# Patient Record
Sex: Female | Born: 1960 | Race: Black or African American | Hispanic: No | Marital: Married | Smoking: Former smoker
Health system: Southern US, Community
[De-identification: ages and names within clinical notes are randomized; demographics above are authoritative.]

## PROBLEM LIST (undated history)

## (undated) ENCOUNTER — Ambulatory Visit: Admission: EM | Source: Home / Self Care

## (undated) DIAGNOSIS — I10 Essential (primary) hypertension: Secondary | ICD-10-CM

## (undated) DIAGNOSIS — F419 Anxiety disorder, unspecified: Secondary | ICD-10-CM

## (undated) DIAGNOSIS — Z87442 Personal history of urinary calculi: Secondary | ICD-10-CM

## (undated) DIAGNOSIS — G43909 Migraine, unspecified, not intractable, without status migrainosus: Secondary | ICD-10-CM

## (undated) DIAGNOSIS — I219 Acute myocardial infarction, unspecified: Secondary | ICD-10-CM

## (undated) DIAGNOSIS — M797 Fibromyalgia: Secondary | ICD-10-CM

## (undated) DIAGNOSIS — E669 Obesity, unspecified: Secondary | ICD-10-CM

## (undated) DIAGNOSIS — K219 Gastro-esophageal reflux disease without esophagitis: Secondary | ICD-10-CM

## (undated) DIAGNOSIS — R9431 Abnormal electrocardiogram [ECG] [EKG]: Secondary | ICD-10-CM

## (undated) DIAGNOSIS — E119 Type 2 diabetes mellitus without complications: Secondary | ICD-10-CM

## (undated) DIAGNOSIS — J189 Pneumonia, unspecified organism: Secondary | ICD-10-CM

## (undated) DIAGNOSIS — R0602 Shortness of breath: Secondary | ICD-10-CM

## (undated) DIAGNOSIS — M549 Dorsalgia, unspecified: Secondary | ICD-10-CM

## (undated) DIAGNOSIS — R51 Headache: Secondary | ICD-10-CM

## (undated) DIAGNOSIS — G8929 Other chronic pain: Secondary | ICD-10-CM

## (undated) DIAGNOSIS — F32A Depression, unspecified: Secondary | ICD-10-CM

## (undated) DIAGNOSIS — J40 Bronchitis, not specified as acute or chronic: Secondary | ICD-10-CM

## (undated) HISTORY — PX: TUBAL LIGATION: SHX77

## (undated) HISTORY — PX: OTHER SURGICAL HISTORY: SHX169

---

## 2002-02-10 ENCOUNTER — Other Ambulatory Visit: Admission: RE | Admit: 2002-02-10 | Discharge: 2002-02-10 | Payer: Self-pay | Admitting: *Deleted

## 2002-07-27 ENCOUNTER — Ambulatory Visit (HOSPITAL_COMMUNITY): Admission: RE | Admit: 2002-07-27 | Discharge: 2002-07-27 | Payer: Self-pay | Admitting: *Deleted

## 2003-04-07 ENCOUNTER — Encounter: Payer: Self-pay | Admitting: General Practice

## 2003-04-07 ENCOUNTER — Encounter: Admission: RE | Admit: 2003-04-07 | Discharge: 2003-04-07 | Payer: Self-pay | Admitting: General Practice

## 2003-04-09 ENCOUNTER — Encounter: Admission: RE | Admit: 2003-04-09 | Discharge: 2003-07-08 | Payer: Self-pay | Admitting: General Practice

## 2006-04-01 ENCOUNTER — Emergency Department (HOSPITAL_COMMUNITY): Admission: EM | Admit: 2006-04-01 | Discharge: 2006-04-01 | Payer: Self-pay | Admitting: Emergency Medicine

## 2007-08-01 ENCOUNTER — Emergency Department (HOSPITAL_COMMUNITY): Admission: EM | Admit: 2007-08-01 | Discharge: 2007-08-02 | Payer: Self-pay | Admitting: Emergency Medicine

## 2008-08-10 ENCOUNTER — Ambulatory Visit: Payer: Self-pay | Admitting: Family Medicine

## 2008-08-17 ENCOUNTER — Ambulatory Visit: Payer: Self-pay | Admitting: Family Medicine

## 2008-09-21 ENCOUNTER — Ambulatory Visit: Payer: Self-pay | Admitting: Family Medicine

## 2008-12-09 ENCOUNTER — Ambulatory Visit: Payer: Self-pay | Admitting: Family Medicine

## 2009-01-17 ENCOUNTER — Ambulatory Visit: Payer: Self-pay | Admitting: Family Medicine

## 2009-03-30 ENCOUNTER — Ambulatory Visit: Payer: Self-pay | Admitting: Family Medicine

## 2009-07-22 ENCOUNTER — Emergency Department (HOSPITAL_COMMUNITY): Admission: EM | Admit: 2009-07-22 | Discharge: 2009-07-22 | Payer: Self-pay | Admitting: Emergency Medicine

## 2009-07-23 ENCOUNTER — Emergency Department (HOSPITAL_BASED_OUTPATIENT_CLINIC_OR_DEPARTMENT_OTHER): Admission: EM | Admit: 2009-07-23 | Discharge: 2009-07-23 | Payer: Self-pay | Admitting: Emergency Medicine

## 2009-11-18 ENCOUNTER — Emergency Department (HOSPITAL_COMMUNITY): Admission: EM | Admit: 2009-11-18 | Discharge: 2009-11-18 | Payer: Self-pay | Admitting: Family Medicine

## 2010-06-27 ENCOUNTER — Ambulatory Visit (HOSPITAL_COMMUNITY): Admission: RE | Admit: 2010-06-27 | Discharge: 2010-06-27 | Payer: Self-pay | Admitting: Internal Medicine

## 2010-09-05 ENCOUNTER — Encounter
Admission: RE | Admit: 2010-09-05 | Discharge: 2010-09-14 | Payer: Self-pay | Source: Home / Self Care | Attending: Orthopaedic Surgery | Admitting: Orthopaedic Surgery

## 2010-09-20 ENCOUNTER — Encounter
Admission: RE | Admit: 2010-09-20 | Discharge: 2010-10-17 | Payer: Self-pay | Source: Home / Self Care | Attending: Orthopaedic Surgery | Admitting: Orthopaedic Surgery

## 2010-09-25 ENCOUNTER — Encounter: Admit: 2010-09-25 | Payer: Self-pay | Admitting: Orthopaedic Surgery

## 2010-10-08 ENCOUNTER — Encounter: Payer: Self-pay | Admitting: Internal Medicine

## 2010-10-19 ENCOUNTER — Ambulatory Visit: Payer: BC Managed Care – PPO

## 2010-10-20 ENCOUNTER — Ambulatory Visit: Payer: BC Managed Care – PPO | Attending: Orthopaedic Surgery | Admitting: Physical Therapy

## 2010-10-20 DIAGNOSIS — M545 Low back pain, unspecified: Secondary | ICD-10-CM | POA: Insufficient documentation

## 2010-10-20 DIAGNOSIS — IMO0001 Reserved for inherently not codable concepts without codable children: Secondary | ICD-10-CM | POA: Insufficient documentation

## 2010-10-20 DIAGNOSIS — M6281 Muscle weakness (generalized): Secondary | ICD-10-CM | POA: Insufficient documentation

## 2010-10-26 ENCOUNTER — Ambulatory Visit: Payer: BC Managed Care – PPO | Admitting: Physical Therapy

## 2010-10-27 ENCOUNTER — Ambulatory Visit: Payer: BC Managed Care – PPO | Admitting: Physical Therapy

## 2010-11-02 ENCOUNTER — Ambulatory Visit: Payer: BC Managed Care – PPO | Admitting: Physical Therapy

## 2010-11-03 ENCOUNTER — Ambulatory Visit: Payer: BC Managed Care – PPO | Admitting: Physical Therapy

## 2010-11-16 ENCOUNTER — Ambulatory Visit: Payer: BC Managed Care – PPO | Admitting: Physical Therapy

## 2010-11-17 ENCOUNTER — Ambulatory Visit: Payer: BC Managed Care – PPO | Attending: Orthopaedic Surgery | Admitting: Physical Therapy

## 2010-11-17 DIAGNOSIS — M545 Low back pain, unspecified: Secondary | ICD-10-CM | POA: Insufficient documentation

## 2010-11-17 DIAGNOSIS — M6281 Muscle weakness (generalized): Secondary | ICD-10-CM | POA: Insufficient documentation

## 2010-11-17 DIAGNOSIS — IMO0001 Reserved for inherently not codable concepts without codable children: Secondary | ICD-10-CM | POA: Insufficient documentation

## 2010-11-23 ENCOUNTER — Ambulatory Visit: Payer: BC Managed Care – PPO

## 2010-11-24 ENCOUNTER — Ambulatory Visit: Payer: BC Managed Care – PPO

## 2010-11-28 ENCOUNTER — Ambulatory Visit: Payer: BC Managed Care – PPO

## 2010-11-29 ENCOUNTER — Ambulatory Visit: Payer: BC Managed Care – PPO

## 2010-11-30 ENCOUNTER — Encounter: Payer: Self-pay | Admitting: Physical Therapy

## 2010-12-20 LAB — DIFFERENTIAL
Basophils Absolute: 0.1 10*3/uL (ref 0.0–0.1)
Eosinophils Relative: 1 % (ref 0–5)
Lymphocytes Relative: 18 % (ref 12–46)
Lymphs Abs: 1 10*3/uL (ref 0.7–4.0)
Monocytes Absolute: 0.4 10*3/uL (ref 0.1–1.0)
Monocytes Relative: 7 % (ref 3–12)

## 2010-12-20 LAB — CBC
HCT: 38.6 % (ref 36.0–46.0)
Hemoglobin: 12.9 g/dL (ref 12.0–15.0)
MCHC: 33.4 g/dL (ref 30.0–36.0)
MCV: 92.8 fL (ref 78.0–100.0)
Platelets: 225 10*3/uL (ref 150–400)
RBC: 4.16 MIL/uL (ref 3.87–5.11)
RDW: 12.7 % (ref 11.5–15.5)
WBC: 5.9 10*3/uL (ref 4.0–10.5)

## 2010-12-20 LAB — COMPREHENSIVE METABOLIC PANEL
ALT: 16 U/L (ref 0–35)
AST: 24 U/L (ref 0–37)
Albumin: 4.1 g/dL (ref 3.5–5.2)
Alkaline Phosphatase: 65 U/L (ref 39–117)
BUN: 11 mg/dL (ref 6–23)
CO2: 27 mEq/L (ref 19–32)
Calcium: 9 mg/dL (ref 8.4–10.5)
Chloride: 105 mEq/L (ref 96–112)
Creatinine, Ser: 0.6 mg/dL (ref 0.4–1.2)
GFR calc Af Amer: >60 mL/min (ref >60)
GFR calc non Af Amer: >60 mL/min (ref >60)
Glucose, Bld: 92 mg/dL (ref 70–99)
Potassium: 4 mEq/L (ref 3.5–5.1)
Sodium: 141 mEq/L (ref 135–145)
Total Bilirubin: 0.8 mg/dL (ref 0.3–1.2)
Total Protein: 7.8 g/dL (ref 6.0–8.3)

## 2010-12-20 LAB — URINALYSIS, ROUTINE W REFLEX MICROSCOPIC
Bilirubin Urine: NEGATIVE
Glucose, UA: NEGATIVE mg/dL
Specific Gravity, Urine: 1.005 (ref 1.005–1.030)
pH: 7.5 (ref 5.0–8.0)

## 2010-12-20 LAB — URINE MICROSCOPIC-ADD ON

## 2011-06-26 LAB — POCT CARDIAC MARKERS
Myoglobin, poc: 88.6
Operator id: 272551

## 2011-08-04 ENCOUNTER — Emergency Department (HOSPITAL_COMMUNITY)
Admission: EM | Admit: 2011-08-04 | Discharge: 2011-08-04 | Disposition: A | Discharge status: Home / Self Care | Payer: BC Managed Care – PPO | Attending: Emergency Medicine | Admitting: Emergency Medicine

## 2011-08-04 ENCOUNTER — Encounter: Payer: Self-pay | Admitting: *Deleted

## 2011-08-04 DIAGNOSIS — R5381 Other malaise: Secondary | ICD-10-CM | POA: Insufficient documentation

## 2011-08-04 DIAGNOSIS — R42 Dizziness and giddiness: Secondary | ICD-10-CM | POA: Insufficient documentation

## 2011-08-04 DIAGNOSIS — E860 Dehydration: Secondary | ICD-10-CM | POA: Insufficient documentation

## 2011-08-04 DIAGNOSIS — R109 Unspecified abdominal pain: Secondary | ICD-10-CM | POA: Insufficient documentation

## 2011-08-04 DIAGNOSIS — R5383 Other fatigue: Secondary | ICD-10-CM | POA: Insufficient documentation

## 2011-08-04 DIAGNOSIS — E876 Hypokalemia: Secondary | ICD-10-CM | POA: Insufficient documentation

## 2011-08-04 DIAGNOSIS — R197 Diarrhea, unspecified: Secondary | ICD-10-CM | POA: Insufficient documentation

## 2011-08-04 DIAGNOSIS — R6883 Chills (without fever): Secondary | ICD-10-CM | POA: Insufficient documentation

## 2011-08-04 DIAGNOSIS — R630 Anorexia: Secondary | ICD-10-CM | POA: Insufficient documentation

## 2011-08-04 LAB — URINALYSIS, ROUTINE W REFLEX MICROSCOPIC
Bilirubin Urine: NEGATIVE
Glucose, UA: NEGATIVE mg/dL
Ketones, ur: 40 mg/dL — AB
Leukocytes, UA: NEGATIVE
Nitrite: NEGATIVE
Protein, ur: NEGATIVE mg/dL
pH: 6.5 (ref 5.0–8.0)

## 2011-08-04 LAB — DIFFERENTIAL
Basophils Absolute: 0 10*3/uL (ref 0.0–0.1)
Eosinophils Absolute: 0 10*3/uL (ref 0.0–0.7)
Eosinophils Relative: 0 % (ref 0–5)
Monocytes Absolute: 0.5 10*3/uL (ref 0.1–1.0)

## 2011-08-04 LAB — CBC
HCT: 37.1 % (ref 36.0–46.0)
MCH: 31 pg (ref 26.0–34.0)
MCV: 87.9 fL (ref 78.0–100.0)
Platelets: 250 10*3/uL (ref 150–400)
RDW: 11.8 % (ref 11.5–15.5)

## 2011-08-04 LAB — URINE MICROSCOPIC-ADD ON

## 2011-08-04 LAB — POCT I-STAT, CHEM 8
BUN: 6 mg/dL (ref 6–23)
Calcium, Ion: 1.06 mmol/L — ABNORMAL LOW (ref 1.12–1.32)
Chloride: 106 mEq/L (ref 96–112)
Glucose, Bld: 86 mg/dL (ref 70–99)
TCO2: 21 mmol/L (ref 0–100)

## 2011-08-04 MED ORDER — SODIUM CHLORIDE 0.9 % IV BOLUS (SEPSIS)
1000.0000 mL | Freq: Once | INTRAVENOUS | Status: AC
  Administered 2011-08-04: 1000 mL via INTRAVENOUS

## 2011-08-04 MED ORDER — ONDANSETRON HCL 4 MG/2ML IJ SOLN
4.0000 mg | Freq: Once | INTRAMUSCULAR | Status: AC
  Administered 2011-08-04: 4 mg via INTRAVENOUS

## 2011-08-04 MED ORDER — SODIUM CHLORIDE 0.9 % IV SOLN: INTRAVENOUS | Status: DC

## 2011-08-04 MED ORDER — POTASSIUM CHLORIDE 20 MEQ/15ML (10%) PO LIQD
40.0000 meq | Freq: Once | ORAL | Status: AC
  Administered 2011-08-04: 40 meq via ORAL

## 2011-08-04 MED ORDER — MORPHINE SULFATE 4 MG/ML IJ SOLN
4.0000 mg | Freq: Once | INTRAMUSCULAR | Status: AC
  Administered 2011-08-04: 4 mg via INTRAVENOUS

## 2011-08-04 MED ORDER — ONDANSETRON HCL 4 MG PO TABS: 4.0000 mg | ORAL_TABLET | Freq: Three times a day (TID) | ORAL | Status: AC | PRN

## 2011-08-04 MED ORDER — POTASSIUM CHLORIDE 20 MEQ/15ML (10%) PO LIQD
ORAL | Status: AC
  Filled 2011-08-04: qty 30

## 2011-08-04 NOTE — ED Notes (Signed)
Patient is resting comfortably. 

## 2011-08-04 NOTE — ED Notes (Signed)
Pt attempting again to provide urine sample

## 2011-08-04 NOTE — ED Notes (Signed)
Patient here with c/o diarrhea and abdominal pain for the past few days.  Patient feels weak.

## 2011-08-04 NOTE — ED Provider Notes (Signed)
History    patient presents to the ED with a chief complaint of diarrhea. Patient states she has had 4 or 5 bouts of diarrhea every day for the past week. She denies nausea or vomiting but afraid to eat because of her diarrhea. She denies fever or chills. She does endorse feeling fatigue, lightheadedness, and increase shortness of breath.  Patient states she initially will having abdominal cramping and constipation for several weeks prior. She was seen and evaluated by Dr. Elnoria Howard, a gastroenterologist. She had a colonoscopy performed and subsequently started on a new medication, amitiza (lubiprostone).  Patient states after taking her meds as of for a few days she begins to develop diarrhea. She denies taking antibiotic. She denies blood per rectum. She denies recent travel, or eating exotic food. She has been feeling very dehydrated.  She also mentioned that she has started her menstruation. She does complain of abdominal cramping, and has discussed this with Dr. Elnoria Howard.  Dr. acknowledged that her medication may have worsening abdominal cramping she is currently having her menstruation. He recommends for her to come to the ED for fluid hydration. He also recommended for her to stop all medication.  CSN: [id number] Arrival date & time: 08/04/2011  4:33 PM   First MD Initiated Contact with Patient 08/04/11 1845      Chief Complaint  Patient presents with  . Diarrhea    (Consider location/radiation/quality/duration/timing/severity/associated sxs/prior treatment) Patient is a 50 y.o. female presenting with diarrhea. The history is provided by the patient. No language interpreter was used.  Diarrhea The primary symptoms include fatigue, abdominal pain and diarrhea. Primary symptoms do not include fever, nausea, vomiting, melena, dysuria or rash. The illness began 2 days ago. The onset was gradual.  The fatigue began 6 to 7 days ago.  The abdominal pain has been unchanged since its onset. The abdominal  pain is generalized.  The illness is also significant for chills and anorexia. The illness does not include constipation. Significant associated medical issues include irritable bowel syndrome.    History reviewed. No pertinent past medical history.  History reviewed. No pertinent past surgical history.  History reviewed. No pertinent family history.  History  Substance Use Topics  . Smoking status: Never Smoker   . Smokeless tobacco: Not on file  . Alcohol Use: No    OB History    Grav Para Term Preterm Abortions TAB SAB Ect Mult Living                  Review of Systems  Constitutional: Positive for chills and fatigue. Negative for fever.  Gastrointestinal: Positive for abdominal pain, diarrhea and anorexia. Negative for nausea, vomiting, constipation and melena.  Genitourinary: Negative for dysuria.  Skin: Negative for rash.  All other systems reviewed and are negative.    Allergies  Review of patient's allergies indicates no known allergies.  Home Medications   Current Outpatient Rx  Name Route Sig Dispense Refill  . HYOSCYAMINE SULFATE 0.125 MG PO TABS Oral Take 0.125 mg by mouth every 6 (six) hours as needed. spasm     . PREGABALIN 75 MG PO CAPS Oral Take 75 mg by mouth 2 (two) times daily.      . SERTRALINE HCL 50 MG PO TABS Oral Take 50 mg by mouth 2 (two) times daily.        BP 137/84  Pulse 89  Temp(Src) 98.2 F (36.8 C) (Oral)  Resp 16  SpO2 99%  Physical Exam  Constitutional: She  is oriented to person, place, and time. She appears well-developed and well-nourished. No distress.  HENT:  Head: Normocephalic and atraumatic.  Eyes: Conjunctivae are normal.  Neck: Normal range of motion. Neck supple.  Cardiovascular: Normal rate and regular rhythm.  Exam reveals no gallop and no friction rub.   No murmur heard. Pulmonary/Chest: Effort normal. No respiratory distress. She has no wheezes.  Abdominal: Soft. Bowel sounds are normal. There is no  tenderness. There is no rebound and no guarding.  Musculoskeletal: Normal range of motion.  Neurological: She is alert and oriented to person, place, and time.  Skin: She is diaphoretic. There is pallor.    ED Course  Procedures (including critical care time)  Labs Reviewed - No data to display No results found.   No diagnosis found.    MDM   patient appears dehydrated, and weak.  Oral mucosa is dry. Her abdomen is benign. With recent menstruation and continuous bout of diarrhea, the patient is dehydrated. IV fluid initiated. I will continue to monitor patient care.   11:17 PM Patient states she feels much better after IV fluid hydration. She has not had any bouts of diarrhea while in the ED. She felt comfortable going home. She has agreed to followup with her primary care doctor on Monday.      Fayrene Helper, PA 08/04/11 2322

## 2011-08-04 NOTE — ED Notes (Signed)
Family at bedside. 

## 2011-08-04 NOTE — ED Notes (Signed)
Vital signs stable. 

## 2011-08-05 NOTE — ED Provider Notes (Signed)
Medical screening examination/treatment/procedure(s) were performed by non-physician practitioner and as supervising physician I was immediately available for consultation/collaboration.  Flint Melter, MD 08/05/11 [phone]

## 2011-08-13 ENCOUNTER — Other Ambulatory Visit: Payer: Self-pay | Admitting: Obstetrics & Gynecology

## 2011-08-13 DIAGNOSIS — Z1231 Encounter for screening mammogram for malignant neoplasm of breast: Secondary | ICD-10-CM

## 2011-08-24 ENCOUNTER — Encounter (HOSPITAL_COMMUNITY): Payer: Self-pay | Admitting: Pharmacy Technician

## 2011-08-28 ENCOUNTER — Ambulatory Visit (HOSPITAL_COMMUNITY)
Admission: RE | Admit: 2011-08-28 | Discharge: 2011-08-28 | Disposition: A | Discharge status: Home / Self Care | Payer: BC Managed Care – PPO | Source: Ambulatory Visit | Attending: Obstetrics & Gynecology | Admitting: Obstetrics & Gynecology

## 2011-08-28 ENCOUNTER — Encounter (HOSPITAL_COMMUNITY): Payer: Self-pay

## 2011-08-28 ENCOUNTER — Encounter (HOSPITAL_COMMUNITY)
Admission: RE | Admit: 2011-08-28 | Discharge: 2011-08-28 | Disposition: A | Discharge status: Home / Self Care | Payer: BC Managed Care – PPO | Source: Ambulatory Visit | Attending: Obstetrics & Gynecology | Admitting: Obstetrics & Gynecology

## 2011-08-28 DIAGNOSIS — M4120 Other idiopathic scoliosis, site unspecified: Secondary | ICD-10-CM | POA: Insufficient documentation

## 2011-08-28 DIAGNOSIS — Z01818 Encounter for other preprocedural examination: Secondary | ICD-10-CM | POA: Insufficient documentation

## 2011-08-28 DIAGNOSIS — Z01812 Encounter for preprocedural laboratory examination: Secondary | ICD-10-CM | POA: Insufficient documentation

## 2011-08-28 HISTORY — DX: Anxiety disorder, unspecified: F41.9

## 2011-08-28 HISTORY — DX: Gastro-esophageal reflux disease without esophagitis: K21.9

## 2011-08-28 HISTORY — DX: Fibromyalgia: M79.7

## 2011-08-28 HISTORY — DX: Essential (primary) hypertension: I10

## 2011-08-28 HISTORY — DX: Headache: R51

## 2011-08-28 HISTORY — DX: Shortness of breath: R06.02

## 2011-08-28 LAB — BASIC METABOLIC PANEL
BUN: 11 mg/dL (ref 6–23)
CO2: 27 mEq/L (ref 19–32)
Chloride: 103 mEq/L (ref 96–112)
Creatinine, Ser: 0.67 mg/dL (ref 0.50–1.10)
Potassium: 3.6 mEq/L (ref 3.5–5.1)

## 2011-08-28 LAB — SURGICAL PCR SCREEN
MRSA, PCR: INVALID — AB
Staphylococcus aureus: INVALID — AB

## 2011-08-28 LAB — CBC
HCT: 39.2 % (ref 36.0–46.0)
Hemoglobin: 13.3 g/dL (ref 12.0–15.0)
RBC: 4.38 MIL/uL (ref 3.87–5.11)

## 2011-08-28 NOTE — Pre-Procedure Instructions (Addendum)
PT'S OFFICE NOTES FROM MONICA LYALL, NP AND EKG REPORT OBTAINED--OFFICE VISIT WAS 08/21/11--PT STATES SHE WAS HAVING CHEST PRESSURE--SOME SOB AND FEELING TIRED.  PT STATES SHE WAS TOLD HER B/P ELEVATED AND EKG ABNORMAL--GIVEN B/P MEDICATION TO START TAKING AND TOLD THAT THE OFFICE WOULD ARRANGE FOR HER TO SEE A CARDIOLOGIST.  PT STATES DR. JACKSON-MOORE NOT AWARE.  COPY OF THE OFFICE NOTES, EKG  FAXED TO DR. JACKSON-MOORE'S OFFICE AND JASMINE-SURGERY SCHEDULER FOR DR. JACKSON-MOORE NOTIFIED-- SHE WILL BE SURE REPORTS GO TO DR. JACKSON-MOORE. 09/07/11 -SPOKE WITH PT BY PHONE--SHE SAW CARDIOLOGIST--HAD STRESS TEST-PT TOLD TEST WAS OK.  STATES SHE IS WAITING FOR HER MEDICAL DOCTOR TO GIVE CLEARANCE FOR HER SURGERY.  I OBTAINED PT'S CARDIOLOGY OFFICE NOTES 08/29/11 WITH EKG FROM DR. HARDING WITH [facility] AND OBTAINED NUCLEAR STRESS TEST REPORT 08/30/11-NORMAL. REPORTS ARE ON THIS CHART. 09/13/11  DR. JACKSON-MOORE'S OFFICE FAXED NOTE OF CARDIAC CLEARANCE FROM DR. CROITORU-CARDIOLOGIST AND NOTE PLACED ON THIS CHART.

## 2011-08-28 NOTE — Patient Instructions (Addendum)
19 Lisa Casey  08/28/2011   Your procedure is scheduled on:  Friday 08/31/11 AT 7:30 AM  Report to [facility] at  5:30 AM.  Call this number if you have problems the morning of surgery: [phone]   Remember:   Do not eat food OR DRINK ANYTHING AFTER MIDNIGHT THE NIGHT BEFORE YOUR SURGERY.    Take these medicines the morning of surgery with A SIP OF WATER: SERTRALINE AND LYRICA   Do not wear jewelry, make-up or nail polish.  Do not wear lotions, powders, or perfumes. You may wear deodorant.  Do not shave 48 hours prior to surgery.  Do not bring valuables to the hospital.  Contacts, dentures or bridgework may not be worn into surgery.  Leave suitcase in the car. After surgery it may be brought to your room.  For patients admitted to the hospital, checkout time is 11:00 AM the day of discharge.   Patients discharged the day of surgery will not be allowed to drive home.    Special Instructions: CHG Shower Use Special Wash: 1/2 bottle night before surgery and 1/2 bottle morning of surgery.   Please read over the following fact sheets that you were given: Blood Transfusion Information and MRSA Information  ADDEND:  09/07/11 3PM  PT NOTIFIED HER SURGERY DATE WAS CHANGED TO 09/14/11 AT 11:15 AM--SHE WOULD NEED TO ARRIVE TO SHORT STAY BY 9:00 AM --ALL OTHER INSTRUCTIONS THE SAME-INCLUDING NPO AFTER MIDNIGHT EXCEPT SIP OF WATER TO TAKE MEDS AS DISCUSSED PREVIOUSLY.  INSTRUCTIONS WERE GIVEN TO PT BY TELEPHONE CONVERSATION.

## 2011-08-31 LAB — MRSA CULTURE

## 2011-09-13 ENCOUNTER — Encounter (HOSPITAL_COMMUNITY): Payer: Self-pay | Admitting: Obstetrics & Gynecology

## 2011-09-13 ENCOUNTER — Ambulatory Visit (HOSPITAL_COMMUNITY): Payer: BC Managed Care – PPO

## 2011-09-13 NOTE — H&P (Signed)
  Subjective:  Lisa Casey is a 50 y.o. gravida 3 para 2, female.  She presented with irregular bleeding.   Bleeding is characterized as moderate. Evaluation to date: pelvic ultrasound: positive for uterine fibroids. Treatment to date: Nuva Ring.  Pertinent Gyn History:  Menses irregular Bleeding: dysfunctional uterine bleeding Contraception: tubal ligation DES exposure: unknown  Last pap: normal Date: 9/12  There are no active problems to display for this patient.  Past Medical History  Diagnosis Date  . Fibromyalgia     PT ON LYRIC   . Hypertension     HX OF ELEVATED B/P FOR SEVERAL YRS--NO MEDS UNTIL LAST WEEK--PT WAS C/O OF CHEST PRESSURE AND B/P ELEVATED--PT SAW NURSE PRACTIONER ON 08/21/11 AND STATES EKG WAS DONE--TOLD ABNORMAL--PUT ON B/P MEDICATION LISINOPRIL AND TOLD SHE WAS TO SEE A CARDIOLOGIST.  [name] Shortness of breath     WITH EXERTION--NEW FOR PT--PT ALSO REPORTS FEELING TIRED  . GERD (gastroesophageal reflux disease)     NOT ON ANY MEDS AT PRESENT  . Headache     HX OF MIGRAINES  . Anxiety     HX OF ANXIETY ATTACKS    Past Surgical History  Procedure Date  . Tubal ligation   . Constipation     No prescriptions prior to admission   No Known Allergies  History  Substance Use Topics  . Smoking status: Never Smoker   . Smokeless tobacco: Never Used  . Alcohol Use: No    History reviewed. No pertinent family history.   Review of Systems Pertinent items are noted in HPI.    Objective:   Vital signs in last 24 hours:    General:   alert  Skin:   normal  HEENT:  PERRLA  Lungs:   clear to auscultation bilaterally  Heart:   regular rate and rhythm, S1, S2 normal, no murmur, click, rub or gallop  Breasts:   normal without suspicious masses, skin or nipple changes or axillary nodes  Abdomen:  soft, non-tender; bowel sounds normal; no masses,  no organomegaly  Pelvis:  Vulva and vagina appear normal. Bimanual exam reveals normal uterus and  adnexa. Vaginal: normal mucosa without prolapse or lesions Cervix: normal appearance Adnexa: non palpable Uterus: anteverted and slightly enlarged                                     Assessment/Plan:  Perimenopausal DUB, small fibroid uterus     I had a lengthy discussion with the patient regarding her bleeding and consideration for endometrial ablation versus hysterectomy.  Procedure, risks, reasons, benefits and complications (including injury to bowel, bladder, major blood vessel, ureter, bleeding, possibility of transfusion, infection, or fistula formation) were reviewed in detail. Consent was signed and preop testing was ordered.  Instructions were reviewed, including NPO after midnight.

## 2011-09-14 ENCOUNTER — Encounter (HOSPITAL_COMMUNITY): Payer: Self-pay | Admitting: Anesthesiology

## 2011-09-14 ENCOUNTER — Encounter (HOSPITAL_COMMUNITY)
Admission: RE | Disposition: A | Discharge status: Home / Self Care | Payer: Self-pay | Source: Ambulatory Visit | Attending: Obstetrics & Gynecology

## 2011-09-14 ENCOUNTER — Other Ambulatory Visit: Payer: Self-pay | Admitting: Obstetrics & Gynecology

## 2011-09-14 ENCOUNTER — Encounter (HOSPITAL_COMMUNITY): Payer: Self-pay

## 2011-09-14 ENCOUNTER — Other Ambulatory Visit: Payer: Self-pay

## 2011-09-14 ENCOUNTER — Ambulatory Visit (HOSPITAL_COMMUNITY)
Admission: RE | Admit: 2011-09-14 | Discharge: 2011-09-15 | Disposition: A | Discharge status: Home / Self Care | Payer: BC Managed Care – PPO | Source: Ambulatory Visit | Attending: Obstetrics & Gynecology | Admitting: Obstetrics & Gynecology

## 2011-09-14 ENCOUNTER — Ambulatory Visit (HOSPITAL_COMMUNITY): Payer: BC Managed Care – PPO | Admitting: Anesthesiology

## 2011-09-14 DIAGNOSIS — I10 Essential (primary) hypertension: Secondary | ICD-10-CM | POA: Insufficient documentation

## 2011-09-14 DIAGNOSIS — K219 Gastro-esophageal reflux disease without esophagitis: Secondary | ICD-10-CM | POA: Insufficient documentation

## 2011-09-14 DIAGNOSIS — D259 Leiomyoma of uterus, unspecified: Secondary | ICD-10-CM | POA: Diagnosis present

## 2011-09-14 DIAGNOSIS — IMO0001 Reserved for inherently not codable concepts without codable children: Secondary | ICD-10-CM | POA: Insufficient documentation

## 2011-09-14 DIAGNOSIS — N938 Other specified abnormal uterine and vaginal bleeding: Secondary | ICD-10-CM | POA: Insufficient documentation

## 2011-09-14 DIAGNOSIS — Z79899 Other long term (current) drug therapy: Secondary | ICD-10-CM | POA: Insufficient documentation

## 2011-09-14 DIAGNOSIS — N949 Unspecified condition associated with female genital organs and menstrual cycle: Secondary | ICD-10-CM | POA: Insufficient documentation

## 2011-09-14 HISTORY — PX: ROBOTIC ASSISTED LAP VAGINAL HYSTERECTOMY: SHX2362

## 2011-09-14 LAB — TYPE AND SCREEN: ABO/RH(D): B POS

## 2011-09-14 LAB — ABO/RH: ABO/RH(D): B POS

## 2011-09-14 SURGERY — ROBOTIC ASSISTED LAPAROSCOPIC VAGINAL HYSTERECTOMY
Anesthesia: General | Site: Abdomen | Wound class: Clean Contaminated

## 2011-09-14 MED ORDER — LISINOPRIL 10 MG PO TABS
10.0000 mg | ORAL_TABLET | ORAL | Status: DC
  Administered 2011-09-15: 10 mg via ORAL
  Filled 2011-09-14: qty 1

## 2011-09-14 MED ORDER — LACTATED RINGERS IV SOLN
INTRAVENOUS | Status: DC
  Administered 2011-09-14: 1000 mL via INTRAVENOUS
  Administered 2011-09-14 (×2): via INTRAVENOUS

## 2011-09-14 MED ORDER — GLYCOPYRROLATE 0.2 MG/ML IJ SOLN
INTRAMUSCULAR | Status: DC | PRN
  Administered 2011-09-14: .6 mg via INTRAVENOUS

## 2011-09-14 MED ORDER — FENTANYL CITRATE 0.05 MG/ML IJ SOLN
25.0000 ug | INTRAMUSCULAR | Status: DC | PRN
  Administered 2011-09-14: 50 ug via INTRAVENOUS

## 2011-09-14 MED ORDER — LIDOCAINE HCL (CARDIAC) 20 MG/ML IV SOLN
INTRAVENOUS | Status: DC | PRN
  Administered 2011-09-14: 100 mg via INTRAVENOUS

## 2011-09-14 MED ORDER — PREGABALIN 75 MG PO CAPS
75.0000 mg | ORAL_CAPSULE | Freq: Two times a day (BID) | ORAL | Status: DC
  Administered 2011-09-14 – 2011-09-15 (×2): 75 mg via ORAL
  Filled 2011-09-14 (×2): qty 1

## 2011-09-14 MED ORDER — KCL IN DEXTROSE-NACL 20-5-0.45 MEQ/L-%-% IV SOLN
INTRAVENOUS | Status: DC
  Administered 2011-09-14 – 2011-09-15 (×3): via INTRAVENOUS
  Filled 2011-09-14 (×6): qty 1000

## 2011-09-14 MED ORDER — BUPIVACAINE HCL 0.25 % IJ SOLN
INTRAMUSCULAR | Status: DC | PRN
  Administered 2011-09-14: 10 mL

## 2011-09-14 MED ORDER — PROPOFOL 10 MG/ML IV EMUL
INTRAVENOUS | Status: DC | PRN
  Administered 2011-09-14: 160 mg via INTRAVENOUS

## 2011-09-14 MED ORDER — PROMETHAZINE HCL 25 MG/ML IJ SOLN: 6.2500 mg | INTRAMUSCULAR | Status: DC | PRN

## 2011-09-14 MED ORDER — NEOSTIGMINE METHYLSULFATE 1 MG/ML IJ SOLN
INTRAMUSCULAR | Status: DC | PRN
  Administered 2011-09-14: 4 mg via INTRAVENOUS

## 2011-09-14 MED ORDER — MIDAZOLAM HCL 5 MG/5ML IJ SOLN
INTRAMUSCULAR | Status: DC | PRN
  Administered 2011-09-14 (×2): 1 mg via INTRAVENOUS

## 2011-09-14 MED ORDER — SERTRALINE HCL 50 MG PO TABS
50.0000 mg | ORAL_TABLET | Freq: Two times a day (BID) | ORAL | Status: DC
  Administered 2011-09-14 – 2011-09-15 (×2): 50 mg via ORAL
  Filled 2011-09-14 (×3): qty 1

## 2011-09-14 MED ORDER — ZOLPIDEM TARTRATE 5 MG PO TABS
5.0000 mg | ORAL_TABLET | Freq: Every evening | ORAL | Status: DC | PRN
Start: 2011-09-14 — End: 2011-09-15

## 2011-09-14 MED ORDER — OXYCODONE-ACETAMINOPHEN 5-325 MG PO TABS
1.0000 | ORAL_TABLET | ORAL | Status: DC | PRN
  Administered 2011-09-14 – 2011-09-15 (×2): 2 via ORAL
  Filled 2011-09-14 (×2): qty 2

## 2011-09-14 MED ORDER — ONDANSETRON HCL 4 MG/2ML IJ SOLN
INTRAMUSCULAR | Status: DC | PRN
  Administered 2011-09-14: 4 mg via INTRAVENOUS

## 2011-09-14 MED ORDER — CISATRACURIUM BESYLATE 2 MG/ML IV SOLN
INTRAVENOUS | Status: DC | PRN
  Administered 2011-09-14: 16 mg via INTRAVENOUS
  Administered 2011-09-14: 4 mg via INTRAVENOUS

## 2011-09-14 MED ORDER — ACETAMINOPHEN 10 MG/ML IV SOLN
INTRAVENOUS | Status: DC | PRN
  Administered 2011-09-14: 1000 mg via INTRAVENOUS

## 2011-09-14 MED ORDER — LACTATED RINGERS IV SOLN: INTRAVENOUS | Status: DC

## 2011-09-14 MED ORDER — ONDANSETRON HCL 4 MG/2ML IJ SOLN: 4.0000 mg | Freq: Four times a day (QID) | INTRAMUSCULAR | Status: DC | PRN

## 2011-09-14 MED ORDER — LACTATED RINGERS IR SOLN
Status: DC | PRN
  Administered 2011-09-14: 1000 mL

## 2011-09-14 MED ORDER — MORPHINE SULFATE 2 MG/ML IJ SOLN
2.0000 mg | INTRAMUSCULAR | Status: DC | PRN
  Administered 2011-09-14: 2 mg via INTRAVENOUS
  Filled 2011-09-14 (×2): qty 1

## 2011-09-14 MED ORDER — ONDANSETRON HCL 4 MG PO TABS: 4.0000 mg | ORAL_TABLET | Freq: Four times a day (QID) | ORAL | Status: DC | PRN

## 2011-09-14 MED ORDER — SUFENTANIL CITRATE 50 MCG/ML IV SOLN
INTRAVENOUS | Status: DC | PRN
  Administered 2011-09-14: 20 ug via INTRAVENOUS
  Administered 2011-09-14 (×3): 10 ug via INTRAVENOUS

## 2011-09-14 MED ORDER — CEFAZOLIN SODIUM-DEXTROSE 2-3 GM-% IV SOLR
2.0000 g | Freq: Once | INTRAVENOUS | Status: AC
  Administered 2011-09-14: 2 g via INTRAVENOUS

## 2011-09-14 MED ORDER — DEXAMETHASONE SODIUM PHOSPHATE 4 MG/ML IJ SOLN
INTRAMUSCULAR | Status: DC | PRN
  Administered 2011-09-14: 8 mg via INTRAVENOUS

## 2011-09-14 SURGICAL SUPPLY — 38 items
ADH SKN CLS APL DERMABOND .7 (GAUZE/BANDAGES/DRESSINGS)
CHLORAPREP W/TINT 26ML (MISCELLANEOUS) ×2 IMPLANT
CLOTH BEACON ORANGE TIMEOUT ST (SAFETY) ×2 IMPLANT
CORDS BIPOLAR (ELECTRODE) ×2 IMPLANT
COVER SURGICAL LIGHT HANDLE (MISCELLANEOUS) ×2 IMPLANT
COVER TIP SHEARS 8 DVNC (MISCELLANEOUS) ×1 IMPLANT
COVER TIP SHEARS 8MM DA VINCI (MISCELLANEOUS) ×1
DECANTER SPIKE VIAL GLASS SM (MISCELLANEOUS) ×1 IMPLANT
DERMABOND ADVANCED (GAUZE/BANDAGES/DRESSINGS)
DERMABOND ADVANCED .7 DNX12 (GAUZE/BANDAGES/DRESSINGS) ×1 IMPLANT
DRAPE SURG IRRIG POUCH 19X23 (DRAPES) ×2 IMPLANT
DRSG TEGADERM 6X8 (GAUZE/BANDAGES/DRESSINGS) ×6 IMPLANT
ELECT REM PT RETURN 9FT ADLT (ELECTROSURGICAL) ×2
ELECTRODE REM PT RTRN 9FT ADLT (ELECTROSURGICAL) ×1 IMPLANT
FILTER SMOKE EVAC LAPAROSHD (FILTER) IMPLANT
GAUZE VASELINE 3X9 (GAUZE/BANDAGES/DRESSINGS) IMPLANT
GLOVE BIO SURGEON STRL SZ8 (GLOVE) ×2 IMPLANT
HOLDER FOLEY CATH W/STRAP (MISCELLANEOUS) ×1 IMPLANT
KIT ACCESSORY DA VINCI DISP (KITS) ×1
KIT ACCESSORY DVNC DISP (KITS) ×1 IMPLANT
MANIPULATOR UTERINE 4.5 ZUMI (MISCELLANEOUS) ×1 IMPLANT
OCCLUDER COLPOPNEUMO (BALLOONS) ×3 IMPLANT
PACK ROBOTIC CUSTOM GYN (CUSTOM PROCEDURE TRAY) ×2 IMPLANT
SET TUBE IRRIG SUCTION NO TIP (IRRIGATION / IRRIGATOR) ×2 IMPLANT
SOLUTION ELECTROLUBE (MISCELLANEOUS) ×2 IMPLANT
SPONGE LAP 18X18 X RAY DECT (DISPOSABLE) IMPLANT
SUT MNCRL AB 4-0 PS2 18 (SUTURE) ×2 IMPLANT
SUT VIC AB 0 CT1 27 (SUTURE) ×2
SUT VIC AB 0 CT1 27XBRD ANTBC (SUTURE) ×3 IMPLANT
SUT VIC AB 4-0 PS2 27 (SUTURE) ×2 IMPLANT
SUT VICRYL 0 UR6 27IN ABS (SUTURE) ×2 IMPLANT
SYR BULB IRRIGATION 50ML (SYRINGE) ×2 IMPLANT
SYR CONTROL 10ML LL (SYRINGE) ×4 IMPLANT
TROCAR 12M 150ML BLUNT (TROCAR) ×2 IMPLANT
TROCAR BLADELESS OPT 5 100 (ENDOMECHANICALS) ×2 IMPLANT
TROCAR BLADELESS OPT 5 75 (ENDOMECHANICALS) ×1 IMPLANT
TROCAR XCEL 12X100 BLDLESS (ENDOMECHANICALS) ×2 IMPLANT
WATER STERILE IRR 1500ML POUR (IV SOLUTION) ×4 IMPLANT

## 2011-09-14 NOTE — Anesthesia Procedure Notes (Signed)
Procedure Name: Intubation Date/Time: 09/14/2011 10:49 AM Performed by: Lurlean Leyden, Sharmaine Bain L. Patient Re-evaluated:Patient Re-evaluated prior to inductionOxygen Delivery Method: Circle System Utilized Preoxygenation: Pre-oxygenation with 100% oxygen Intubation Type: IV induction Ventilation: Mask ventilation without difficulty and Oral airway inserted - appropriate to patient size Laryngoscope Size: Miller and 2 Grade View: Grade I Tube type: Oral Tube size: 7.5 mm Number of attempts: 1 Airway Equipment and Method: stylet Placement Confirmation: ETT inserted through vocal cords under direct vision,  breath sounds checked- equal and bilateral and positive ETCO2 Secured at: 22 cm Tube secured with: Tape Dental Injury: Teeth and Oropharynx as per pre-operative assessment

## 2011-09-14 NOTE — Interval H&P Note (Signed)
History and Physical Interval Note:  09/14/2011 10:28 AM  Lisa Casey  has presented today for surgery, with the diagnosis of UTERINE FIBROIDS  The various methods of treatment have been discussed with the patient and family. After consideration of risks, benefits and other options for treatment, the patient has consented to  Procedure(s): ROBOTIC ASSISTED LAPAROSCOPIC VAGINAL HYSTERECTOMY as a surgical intervention .  The patients' history has been reviewed, patient examined, no change in status, stable for surgery.  I have reviewed the patients' chart and labs.  Questions were answered to the patient's satisfaction.     JACKSON-MOORE,Claryssa Sandner A

## 2011-09-14 NOTE — Anesthesia Postprocedure Evaluation (Signed)
Anesthesia Post Note  Patient: Lisa Casey  Procedure(s) Performed:  ROBOTIC ASSISTED LAPAROSCOPIC VAGINAL HYSTERECTOMY  Anesthesia type: General  Patient location: PACU  Post pain: Pain level controlled  Post assessment: Post-op Vital signs reviewed  Last Vitals:  Filed Vitals:   09/14/11 1400  BP: 123/73  Pulse: 52  Temp:   Resp: 11    Post vital signs: Reviewed  Level of consciousness: sedated  Complications: No apparent anesthesia complications

## 2011-09-14 NOTE — Anesthesia Preprocedure Evaluation (Signed)
Anesthesia Evaluation  Patient identified by MRN, date of birth, ID band Patient awake    Reviewed: Allergy & Precautions, H&P , NPO status , Patient's Chart, lab work & pertinent test results  Airway Mallampati: II TM Distance: >3 FB     Dental  (+) Teeth Intact and Dental Advisory Given,    Pulmonary neg pulmonary ROS, shortness of breath and with exertion,  clear to auscultation  Pulmonary exam normal       Cardiovascular hypertension, Pt. on medications neg cardio ROS Regular Normal    Neuro/Psych  Headaches, PSYCHIATRIC DISORDERS Anxiety  Neuromuscular disease Negative Neurological ROS  Negative Psych ROS   GI/Hepatic negative GI ROS, Neg liver ROS, GERD-  Medicated,  Endo/Other  Negative Endocrine ROS  Renal/GU negative Renal ROS  Genitourinary negative   Musculoskeletal negative musculoskeletal ROS (+) Fibromyalgia -  Abdominal Normal abdominal exam  (+)   Peds negative pediatric ROS (+)  Hematology negative hematology ROS (+)   Anesthesia Other Findings   Reproductive/Obstetrics negative OB ROS                           Anesthesia Physical Anesthesia Plan  ASA: II  Anesthesia Plan: General   Post-op Pain Management:    Induction: Intravenous  Airway Management Planned: Oral ETT  Additional Equipment:   Intra-op Plan:   Post-operative Plan: Extubation in OR  Informed Consent: I have reviewed the patients History and Physical, chart, labs and discussed the procedure including the risks, benefits and alternatives for the proposed anesthesia with the patient or authorized representative who has indicated his/her understanding and acceptance.   Dental advisory given  Plan Discussed with: CRNA  Anesthesia Plan Comments:         Anesthesia Quick Evaluation

## 2011-09-14 NOTE — Op Note (Signed)
Pre-operative Diagnosis: abnormal uterine bleeding and fibroids  Post-operative Diagnosis: abnormal uterine bleeding and fibroids  Operation: Robotic-assisted hysterectomy   Surgeon: Roseanna Rainbow  Assistant: Coral Ceo, MD  Anesthesia: GET  Urine Output: per Anesthesiology  Findings: Small, fibroid uterus  Estimated Blood Loss:  less than 100 mL                 Total IV Fluids: per Anesthesiology         Specimens: PATHOLOGY               Complications:  None; patient tolerated the procedure well.         Disposition: PACU - hemodynamically stable.         Condition: Stable    Procedure Details  The patient was seen in the Holding Room. The risks, benefits, complications, treatment options, and expected outcomes were discussed with the patient.  The patient concurred with the proposed plan, giving informed consent.  The site of surgery properly noted/marked. The patient was identified as Lisa Casey and the procedure verified as a Robotic-assisted hysterectomy with bilateral salpingectomy. A Time Out was held and the above information confirmed.  After induction of anesthesia, the patient was draped and prepped in the usual sterile manner. Pt was placed in supine position after anesthesia and draped and prepped in the usual sterile manner. The abdominal drape was placed after the CholoraPrep had been allowed to dry for 3 minutes.  Her arms were tucked to her side with all appropriate precautions.  The shoulder blocks were placed in the usual fashion.  The patient was placed in the semi-lithotomy position in [place] stirrups.  The perineum was prepped with Betadine.  Foley catheter was placed.  A sterile speculum was placed in the vagina.  The cervix was grasped with a single-tooth tenaculum and dilated with Shawnie Pons dilators.  The ZUMI uterine manipulator with a medium colpotomizer ring was placed without difficulty.  A pneum occluder balloon was placed over the  manipulator.  A second time-out was performed.  OG tube placement was confirmed and to suction.  Approximately 2 cm below the costal margin, in the midclavicular line the skin was anesthestized with 0.25% Marcaine.  A 5 mm incision was made and using a 5 mm Optiview, a 5 mm trocar was placed under direct vision.  The patient's abdomen was insufflated with CO2 gas.  At this point and all points during the procedure, the patient's intra-abdominal pressure did not exceed 15 mmHg.  A 10-12 camera port was place 24 cm above the pubic symphysis.  Bilateral 8 mm ports were place 10 cm and 15 degrees inferior.  All ports were placed under direct visualization.  The 5 mm port was removed.  The incision was extended to accommodate a 10 mm trocar.  A 10 mm trocar and sleeve were advanced under direct visualization.   The robot was docked in the usual fashion.  The round ligament on the right was transected with monopolar cautery.  The anterior and posterior leaves of the broad ligament were opened.  The ureter was identified.  A window was made between the infundibulopelvic ligament and the ureter.  The uterine-ovarian ligament/fallopian tube was coagulated with bipolar cautery and transected.  The uterine vessels were skeletonized to the level of the Koh ring.  A C-loop was created in the usual fashion.  A similar procedure was performed on the patient's left side. The round ligament on the left was transected with monopolar  cautery.  The anterior and posterior leaves of the broad ligament were opened.  The ureter was identified.  A window was made between the infundibulopelvic ligament and the ureter.  The uterine-ovarian ligament/fallopian tube was coagulated with bipolar cautery and transected.  The uterine vessels were skeletonized to the level of the Koh ring.  A C-loop was created in the usual fashion.   The bladder flap was completed.  At this time, the pneum occluder balloon was insufflated and a colpotomy was  performed.  The uterus, cervix, bilateral tubes and ovaries were delivered through the vagina.  All pedicles were inspected under low intraabdominal pressures and noted to be hemostatic.  The vagina cuff was closed using 0-Vicryl on a CT-1 needle in a running fashion.  The abdomen and pelvis were copiously irrigated.  The needle was removed without difficulty.  The instruments were then removed under direct visualization and the robotic ports removed.  The robot was undocked.  Deep, subcutaneous, figure-of-eight 0-Vicryl sutures on a UR-6 needle were placed in the 10-12 mm supraumbilical and infracostal incisions.  All skin incisions were closed in a subcuticular fashion using 3-0 Monocryl.  Steri-strips and Benzoin were applied.  The vagina was swabbed with minimal bleeding noted.   All instrument and needle counts were correct x  2.

## 2011-09-14 NOTE — Transfer of Care (Signed)
Immediate Anesthesia Transfer of Care Note  Patient: Lisa Casey  Procedure(s) Performed:  ROBOTIC ASSISTED LAPAROSCOPIC VAGINAL HYSTERECTOMY  Patient Location: PACU  Anesthesia Type: General  Level of Consciousness: awake and oriented  Airway & Oxygen Therapy: Patient Spontanous Breathing and Patient connected to face mask oxygen  Post-op Assessment: Report given to PACU RN and Post -op Vital signs reviewed and stable  Post vital signs: Reviewed and stable  Complications: No apparent anesthesia complications

## 2011-09-14 NOTE — Preoperative (Signed)
Beta Blockers   Reason not to administer Beta Blockers:Not Applicable 

## 2011-09-14 NOTE — Progress Notes (Signed)
Dr. Rica Mast in- saw EKG COPY - EKG was done in PACU.

## 2011-09-14 NOTE — Progress Notes (Signed)
Dr. Rica Mast in- looked at chart [facility] AND EKG done earlier in PACU- o.k. For patient to go to floor

## 2011-09-15 LAB — CBC
Hemoglobin: 11.2 g/dL — ABNORMAL LOW (ref 12.0–15.0)
MCH: 29.8 pg (ref 26.0–34.0)
MCHC: 33.1 g/dL (ref 30.0–36.0)
MCV: 89.9 fL (ref 78.0–100.0)
Platelets: 233 10*3/uL (ref 150–400)

## 2011-09-15 LAB — BASIC METABOLIC PANEL
Calcium: 9.2 mg/dL (ref 8.4–10.5)
Creatinine, Ser: 0.6 mg/dL (ref 0.50–1.10)
GFR calc non Af Amer: >90 mL/min (ref >90)
Glucose, Bld: 135 mg/dL — ABNORMAL HIGH (ref 70–99)
Sodium: 140 mEq/L (ref 135–145)

## 2011-09-15 MED ORDER — OXYCODONE-ACETAMINOPHEN 5-325 MG PO TABS: 1.0000 | ORAL_TABLET | Freq: Four times a day (QID) | ORAL | Status: AC | PRN

## 2011-09-15 NOTE — Progress Notes (Signed)
Patient ID: Lisa Casey, female   DOB: 1961/07/15, 50 y.o.   MRN: [id number] 1 Day Post-Op Procedure(s) (LRB): ROBOTIC ASSISTED LAPAROSCOPIC VAGINAL HYSTERECTOMY (N/A)  Subjective: Patient reports tolerating PO and no problems voiding.    Objective: Vital signs in last 24 hours: Temp:  [97.5 F (36.4 C)-99 F (37.2 C)] 98.7 F (37.1 C) (12/29 1011) Pulse Rate:  [52-70] 70  (12/29 1011) Resp:  [11-16] 16  (12/29 1011) BP: (107-141)/(66-88) 118/78 mmHg (12/29 1012) SpO2:  [96 %-100 %] 97 % (12/29 1011) Weight:  [75.751 kg (167 lb)] 167 lb (75.751 kg) (12/28 1520) Last BM Date: 09/14/11  Intake/Output from previous day: 12/28 0701 - 12/29 0700 In: 4932.8 [P.O.:720; I.V.:4212.8] Out: 3950 [Urine:3850; Blood:100] Intake/Output this shift: Total I/O In: 120 [P.O.:120] Out: 150 [Urine:150]  Physical Examination:  General: alert GI: soft, non-tender; bowel sounds normal; no masses,  no organomegaly and incision: clean, dry and intact Extremities: extremities normal, atraumatic, no cyanosis or edema   Labs:  WBC/Hgb/Hct/Plts:  6.1/11.2/33.8/233 (12/29 0450) BUN/Cr/glu/ALT/AST/amyl/lip:  5/0.60/--/--/--/--/-- (12/29 0450)  Assessment:  50 y.o. s/p Procedure(s): ROBOTIC ASSISTED LAPAROSCOPIC VAGINAL HYSTERECTOMY: stable  Pain: Pain is well-controlled on oral medications.  CV:  Hypertension:  controlled. Current treatment:  lisinopril (Prinivil).  GI:  Tolerating po: Yes    Prophylaxis: intermittent pneumatic compression boots.  Plan: Advance diet Encourage ambulation Discontinue IV fluids Discharge home   LOS: 1 day     Casey,Lisa Dardis A 09/15/2011, 12:01 PM

## 2011-09-15 NOTE — Discharge Summary (Signed)
Physician Discharge Summary  Patient ID: Lisa Casey MRN: [id number] DOB/AGE: Feb 25, 1961 50 y.o.  Admit date: 09/14/2011 Discharge date: 09/15/2011  Admission Diagnoses:  Active Problems:  Leiomyoma of uterus, unspecified   Discharge Diagnoses:  Active Problems:  Leiomyoma of uterus, unspecified   Discharged Condition: good  Hospital Course: The patient underwent a TRH.  Her postoperative course was uneventful  Consults: none  Significant Diagnostic Studies: None  Treatments: surgery: see above  Discharge Exam: Blood pressure 118/78, pulse 70, temperature 98.7 F (37.1 C), temperature source Oral, resp. rate 16, height 5\' 5"  (1.651 m), weight 75.751 kg (167 lb), last menstrual period 08/18/2011, SpO2 97.00%. General appearance: alert GI: soft, non-tender; bowel sounds normal; no masses,  no organomegaly Extremities: extremities normal, atraumatic, no cyanosis or edema Incision/Wound:  C/D/I x  4  Disposition: Home or Self Care  Discharge Orders    Future Appointments: Provider: Department: Dept Phone: Center:   10/11/2011 11:00 AM [facility] [phone]     Future Orders Please Complete By Expires   Diet - low sodium heart healthy      Diet general      Diet Carb Modified      Activity as tolerated - No restrictions      Increase activity slowly      May walk up steps      May shower / Bathe      Comments:   No tub baths for 6 weeks   Driving Restrictions      Comments:   No driving for 1 week   Lifting restrictions      Comments:   No lifting > 30 lbs for 6 weeks   Sexual Activity Restrictions      Comments:   No intercourse for  8 weeks   Discharge wound care:      Comments:   Keep clean and dry   Call MD for:  temperature >100.4      Call MD for:  persistant nausea and vomiting      Call MD for:  severe uncontrolled pain      Call MD for:  redness, tenderness, or signs of infection (pain, swelling, redness, odor or green/yellow  discharge around incision site)      Call MD for:  persistant dizziness or light-headedness      Call MD for:  extreme fatigue        Current Discharge Medication List    START taking these medications   Details  oxyCODONE-acetaminophen (PERCOCET) 5-325 MG per tablet Take 1-2 tablets by mouth every 6 (six) hours as needed (moderate to severe pain (when tolerating fluids)). Qty: 30 tablet, Refills: 0      CONTINUE these medications which have NOT CHANGED   Details  lisinopril (PRINIVIL,ZESTRIL) 10 MG tablet Take 10 mg by mouth every morning.     Multiple Vitamins-Minerals (MULTIVITAMINS THER. W/MINERALS) TABS Take 1 tablet by mouth daily.     pregabalin (LYRICA) 75 MG capsule Take 75 mg by mouth 2 (two) times daily.      sertraline (ZOLOFT) 50 MG tablet Take 50 mg by mouth 2 (two) times daily.       STOP taking these medications     traMADol (ULTRAM-ER) 100 MG 24 hr tablet        Follow-up Information    Follow up with Roseanna Rainbow, MD. Make an appointment in 2 weeks.   Contact information:   [address]  [place] [address] [phone]          Signed: Roseanna Rainbow 09/15/2011, 12:10 PM

## 2011-09-15 NOTE — Progress Notes (Signed)
Assessment unchanged. Pt verbalized understanding of dc instructions. Script x1 given to pt as provided by MD. Discharged via wheelchair to front entrance accompanied by nurse tech, brother, and son to meet awaiting vehicle to carry home.

## 2011-09-17 ENCOUNTER — Encounter (HOSPITAL_COMMUNITY): Payer: Self-pay | Admitting: Obstetrics & Gynecology

## 2011-10-11 ENCOUNTER — Ambulatory Visit (HOSPITAL_COMMUNITY)
Admission: RE | Admit: 2011-10-11 | Discharge: 2011-10-11 | Disposition: A | Discharge status: Home / Self Care | Payer: BC Managed Care – PPO | Source: Ambulatory Visit | Attending: Obstetrics & Gynecology | Admitting: Obstetrics & Gynecology

## 2011-10-11 DIAGNOSIS — Z1231 Encounter for screening mammogram for malignant neoplasm of breast: Secondary | ICD-10-CM | POA: Insufficient documentation

## 2011-10-26 ENCOUNTER — Other Ambulatory Visit: Payer: Self-pay | Admitting: Cardiology

## 2011-10-29 ENCOUNTER — Ambulatory Visit (HOSPITAL_COMMUNITY)
Admission: RE | Admit: 2011-10-29 | Discharge: 2011-10-29 | Disposition: A | Discharge status: Home / Self Care | Payer: BC Managed Care – PPO | Source: Ambulatory Visit | Attending: Cardiology | Admitting: Cardiology

## 2011-10-29 ENCOUNTER — Other Ambulatory Visit: Payer: Self-pay

## 2011-10-29 ENCOUNTER — Encounter (HOSPITAL_COMMUNITY)
Admission: RE | Disposition: A | Discharge status: Home / Self Care | Payer: Self-pay | Source: Ambulatory Visit | Attending: Cardiology

## 2011-10-29 DIAGNOSIS — R079 Chest pain, unspecified: Secondary | ICD-10-CM | POA: Insufficient documentation

## 2011-10-29 DIAGNOSIS — R9439 Abnormal result of other cardiovascular function study: Secondary | ICD-10-CM | POA: Insufficient documentation

## 2011-10-29 HISTORY — PX: LEFT HEART CATHETERIZATION WITH CORONARY ANGIOGRAM: SHX5451

## 2011-10-29 SURGERY — LEFT HEART CATHETERIZATION WITH CORONARY ANGIOGRAM
Anesthesia: LOCAL

## 2011-10-29 MED ORDER — SODIUM CHLORIDE 0.9 % IV SOLN: 1.0000 mL/kg/h | INTRAVENOUS | Status: DC

## 2011-10-29 MED ORDER — LIDOCAINE HCL (PF) 1 % IJ SOLN
INTRAMUSCULAR | Status: AC
  Filled 2011-10-29: qty 30

## 2011-10-29 MED ORDER — ALPRAZOLAM 0.25 MG PO TABS
0.2500 mg | ORAL_TABLET | Freq: Two times a day (BID) | ORAL | Status: DC | PRN
  Administered 2011-10-29: 0.25 mg via ORAL

## 2011-10-29 MED ORDER — ALPRAZOLAM 0.25 MG PO TABS
ORAL_TABLET | ORAL | Status: AC
  Filled 2011-10-29: qty 1

## 2011-10-29 MED ORDER — VERAPAMIL HCL 2.5 MG/ML IV SOLN
INTRAVENOUS | Status: AC
  Filled 2011-10-29: qty 2

## 2011-10-29 MED ORDER — ACETAMINOPHEN 325 MG PO TABS: 650.0000 mg | ORAL_TABLET | ORAL | Status: DC | PRN

## 2011-10-29 MED ORDER — HEPARIN SODIUM (PORCINE) 1000 UNIT/ML IJ SOLN
INTRAMUSCULAR | Status: AC
  Filled 2011-10-29: qty 1

## 2011-10-29 MED ORDER — ONDANSETRON HCL 4 MG/2ML IJ SOLN: 4.0000 mg | Freq: Four times a day (QID) | INTRAMUSCULAR | Status: DC | PRN

## 2011-10-29 MED ORDER — SODIUM CHLORIDE 0.9 % IV SOLN
INTRAVENOUS | Status: DC
  Administered 2011-10-29: 1000 mL via INTRAVENOUS

## 2011-10-29 MED ORDER — FENTANYL CITRATE 0.05 MG/ML IJ SOLN
INTRAMUSCULAR | Status: AC
Start: 2011-10-29 — End: 2011-10-29
  Filled 2011-10-29: qty 2

## 2011-10-29 MED ORDER — HYDROCODONE-ACETAMINOPHEN 5-325 MG PO TABS
1.0000 | ORAL_TABLET | Freq: Once | ORAL | Status: AC
  Administered 2011-10-29: 1 via ORAL

## 2011-10-29 MED ORDER — DIAZEPAM 5 MG PO TABS
5.0000 mg | ORAL_TABLET | ORAL | Status: AC
  Administered 2011-10-29: 5 mg via ORAL
  Filled 2011-10-29: qty 1

## 2011-10-29 MED ORDER — MORPHINE SULFATE 4 MG/ML IJ SOLN: 1.0000 mg | INTRAMUSCULAR | Status: DC | PRN

## 2011-10-29 MED ORDER — MIDAZOLAM HCL 2 MG/2ML IJ SOLN
INTRAMUSCULAR | Status: AC
  Filled 2011-10-29: qty 2

## 2011-10-29 MED ORDER — SODIUM CHLORIDE 0.9 % IJ SOLN: 3.0000 mL | Freq: Two times a day (BID) | INTRAMUSCULAR | Status: DC

## 2011-10-29 MED ORDER — SODIUM CHLORIDE 0.9 % IV SOLN: 250.0000 mL | INTRAVENOUS | Status: DC | PRN

## 2011-10-29 MED ORDER — HEPARIN (PORCINE) IN NACL 2-0.9 UNIT/ML-% IJ SOLN
INTRAMUSCULAR | Status: AC
  Filled 2011-10-29: qty 2000

## 2011-10-29 MED ORDER — HYDROCODONE-ACETAMINOPHEN 5-325 MG PO TABS
ORAL_TABLET | ORAL | Status: AC
  Filled 2011-10-29: qty 1

## 2011-10-29 MED ORDER — NITROGLYCERIN 0.2 MG/ML ON CALL CATH LAB
INTRAVENOUS | Status: AC
  Filled 2011-10-29: qty 1

## 2011-10-29 MED ORDER — SODIUM CHLORIDE 0.9 % IJ SOLN: 3.0000 mL | INTRAMUSCULAR | Status: DC | PRN

## 2011-10-29 NOTE — Progress Notes (Signed)
DR HARDING NOTIFIED OF C/O CHEST PAIN AND TEARFUL AND ORDER NOTED AND MEDS GIVEN

## 2011-10-29 NOTE — H&P (Signed)
  History and Physical Interval Note:  NAME:  Lisa Casey   MRN: [id number] DOB:  [birth year]   ADMIT DATE: 10/29/2011   10/29/2011 12:42 PM  Lisa Casey is a 51 y.o. female who was seen by me in the office as part of a pre-operative assessment for Hysterectomy.  The reason for consultation was occasional chest tightness and shortness of breath.  She underwent TM Myoview with unusual very early ST depressions but no evidence of ischemia on images.  Based on the Low Risk study, she went forward with her surgery & did well.  She continued to do well & was feeling fine when I saw her on January 16th.  She was quite concerned about the ECG changes, but was asymptomatic at the time, so we decided to monitor her.  She has had some recurrence of her chest discomfort in the past few weeks.  Based upon our discussion in January, she has been referred for diagnostic cardiac catheterization do define her anatomy.  She has presented today for her procedure, with the diagnosis of chest pain and abnormal TM ST.  The various methods of treatment have been discussed with the patient and family. After consideration of risks (as noted below), benefits and other options for treatment, the patient has consented to Procedure(s):  LEFT HEART CATHETERIZATION AND CORONARY ANGIOGRAPHY +/- AD HOC PERCUTANEOUS CORONARY INTERVENTION as a surgical intervention.   The patients' history has been reviewed, patient examined, no change in status from most recent note, stable for surgery. I have reviewed the patients' chart and labs. Questions were answered to the patient's satisfaction.    RISK EXPLANATION: Risks / Complications include, but not limited to: Death, MI, CVA/TIA, VF/VT (with defibrillation), Bradycardia (need for temporary pacer placement), contrast induced nephropathy, bleeding / bruising / hematoma / pseudoaneurysm, vascular or coronary injury (with possible emergent CT or Vascular Surgery), adverse medication  reactions, infection.    Shaylon Aden W [facility] [address] [place], [place]  [address]  [phone]  10/29/2011 12:42 PM

## 2011-10-29 NOTE — Brief Op Note (Signed)
10/29/2011  2:11 PM  PATIENT:  Lisa Casey  51 y.o. female with a history of exertional chest pain and abnormal treadmill portion of her stress test. She had normal myocardial imaging however CV no chest discomfort after hysterectomy. Due to the abnormal treadmill portion of stress test, it was decided to proceed with diagnostic catheterization to delineate her anatomy.  PRE-OPERATIVE DIAGNOSIS:  abnormal cardiolite/chest pain  POST-OPERATIVE DIAGNOSIS:  No evidence of bony artery disease, normal ejection fraction no wall motion abnormalities.  Extremely tortuous Left Coronary system; codominant with large LPLV.  Hemodynamics: Left ventricular pressure: 113/6 mmHg: 9 mm Hg LVEDP; central aortic pressure: 117/73 mmHg; 93 mm Hg mean.  LV gram: Hyperdynamic left ventricle EF is 65-70%; no wall motion abnormalities.  Left main is a long, large caliber vessel it trifurcates into a LAD, Ramus Intermedius, Circumflex, angiographically normal.  LAD the large caliber tortuous vessel that reaches down to none around the apex. There is 2 large septal perforators and 2 small diagonal branches. The rest of the vessel reaches down around the apex witness and disease.  The Circumflex is obese the same diameter as the LAD is also very tortuous the possible portion there is a very small proximal OMB with a small AV nodal artery. The vessel since it terminates into a large posterior lateral branch that gives off one inferior branch. Mr. disease in this vessel reaches down around the apex.  Ramus intermedius is just slightly smaller than the circumflex is also very tortuous and courses in a distribution that runs in a high OM/diagonal distribution. Angiographically normal.   The RCA is a codominant vessel gives rise to PDA and a small AV nodal artery but no significant RPL branch; no evidence radiographically significant disease in this downward takeoff vessel.  PROCEDURE:  Procedure(s): LEFT HEART  CATHETERIZATION WITH CORONARY ANGIOGRAM  Surgeon(s): Marykay Lex, MD  ANESTHESIA:   local and IV sedation; 1 mg Versed, 20 mcg fentanyl. Oral Valium 5 mg.  Medications:   Radial Cocktail: 5 mg Verapamil, 400 mcg NTG, 2 ml 2% Lidocaine -- 10 mL  IV Heparin: 3500 Units  Intracoronary nitroglycerin: 200 mcg  Omnipaque contrast: 90 mL LOCAL MEDICATIONS USED:  LIDOCAINE to mL  EBL:   < 5 mL   Equipment: 5 French Right Radial Glide Sheath; 5 [place] JR 4, 5 [place] JL 3.5, 5 [place] Angled Pigtai  TOURNIQUET:   TR band -- 10 mL air; 1409 hours  DICTATION: .Note written in EPIC  PLAN OF CARE: Discharge to home after PACU  PATIENT DISPOSITION:  PACU - hemodynamically stable.   Delay start of Pharmacological VTE agent (>24hrs) due to surgical blood loss or risk of bleeding: not applicable

## 2012-02-08 ENCOUNTER — Emergency Department (HOSPITAL_COMMUNITY)
Admission: EM | Admit: 2012-02-08 | Discharge: 2012-02-08 | Disposition: A | Discharge status: Home / Self Care | Payer: BC Managed Care – PPO | Source: Home / Self Care | Attending: Family Medicine | Admitting: Family Medicine

## 2012-02-08 ENCOUNTER — Encounter (HOSPITAL_COMMUNITY): Payer: Self-pay | Admitting: Emergency Medicine

## 2012-02-08 ENCOUNTER — Emergency Department (INDEPENDENT_AMBULATORY_CARE_PROVIDER_SITE_OTHER): Payer: BC Managed Care – PPO

## 2012-02-08 DIAGNOSIS — X500XXA Overexertion from strenuous movement or load, initial encounter: Secondary | ICD-10-CM

## 2012-02-08 DIAGNOSIS — S335XXA Sprain of ligaments of lumbar spine, initial encounter: Secondary | ICD-10-CM

## 2012-02-08 DIAGNOSIS — IMO0002 Reserved for concepts with insufficient information to code with codable children: Secondary | ICD-10-CM

## 2012-02-08 DIAGNOSIS — S39012A Strain of muscle, fascia and tendon of lower back, initial encounter: Secondary | ICD-10-CM

## 2012-02-08 MED ORDER — DICLOFENAC POTASSIUM 50 MG PO TABS: 50.0000 mg | ORAL_TABLET | Freq: Three times a day (TID) | ORAL | Status: DC

## 2012-02-08 MED ORDER — CYCLOBENZAPRINE HCL 5 MG PO TABS: 5.0000 mg | ORAL_TABLET | Freq: Three times a day (TID) | ORAL | Status: AC | PRN

## 2012-02-08 NOTE — ED Notes (Signed)
Pt having back pain exacerbation since reaching down to pull out a drawer last Friday. Pt states the pain is mostly in her back but is also in her abdomen and shoots down both legs. Pt states she has a history of back pain and fibromyalgia but this is an exacerbation and meds are not helping, she is still an 8/10 pain.

## 2012-02-08 NOTE — Discharge Instructions (Signed)
Heat to back , use medicines and exercises and see your doctor if further problems .

## 2012-02-08 NOTE — ED Provider Notes (Signed)
History     CSN: [id number]  Arrival date & time 02/08/12  1058   First MD Initiated Contact with Lisa Casey 02/08/12 1100      Chief Complaint  Lisa Casey presents with  . Back Pain    (Consider location/radiation/quality/duration/timing/severity/associated sxs/prior treatment) Lisa Casey is a 51 y.o. female presenting with back pain. The history is provided by the Lisa Casey.  Back Pain  This is a new problem. The current episode started more than 1 week ago. The problem has not changed since onset.Associated with: pulled drawer out and felt low back pain, similar sx yrs ago, treated with chiropracter. The pain is present in the lumbar spine. The pain is mild. The symptoms are aggravated by certain positions, twisting and bending. Pertinent negatives include no chest pain, no abdominal pain, no abdominal swelling, no bowel incontinence, no bladder incontinence, no pelvic pain, no paresthesias, no paresis and no tingling. 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    Past Medical History  Diagnosis Date  . Fibromyalgia     PT ON LYRIC   . Hypertension     HX OF ELEVATED B/P FOR SEVERAL YRS--NO MEDS UNTIL LAST WEEK--PT WAS C/O OF CHEST PRESSURE AND B/P ELEVATED--PT SAW NURSE PRACTIONER ON 08/21/11 AND STATES EKG WAS DONE--TOLD ABNORMAL--PUT ON B/P MEDICATION LISINOPRIL AND TOLD SHE WAS TO SEE A CARDIOLOGIST.  [name] Shortness of breath     WITH EXERTION--NEW FOR PT--PT ALSO REPORTS FEELING TIRED  . GERD (gastroesophageal reflux disease)     NOT ON ANY MEDS AT PRESENT  . Headache     HX OF MIGRAINES  . Anxiety     HX OF ANXIETY ATTACKS    Past Surgical History  Procedure Date  . Tubal ligation   . Constipation   . Robotic assisted lap vaginal hysterectomy 09/14/2011    Procedure: ROBOTIC ASSISTED LAPAROSCOPIC VAGINAL HYSTERECTOMY;  Surgeon: Roseanna Rainbow, MD;  Location: [facility];  Service: Gynecology;  Laterality: N/A;    No family history on file.  History  Substance Use Topics  . Smoking  status: Never Smoker   . Smokeless tobacco: Never Used  . Alcohol Use: No    OB History    Grav Para Term Preterm Abortions TAB SAB Ect Mult Living                  Review of Systems  Constitutional: Negative.   Cardiovascular: Negative for chest pain.  Gastrointestinal: Negative.  Negative for abdominal pain and bowel incontinence.  Genitourinary: Negative.  Negative for bladder incontinence and pelvic pain.  Musculoskeletal: Positive for back pain. Negative for joint swelling, arthralgias and gait problem.  Neurological: Negative for tingling and paresthesias.    Allergies  Review of Lisa Casey's allergies indicates no known allergies.  Home Medications   Current Outpatient Rx  Name Route Sig Dispense Refill  . CYCLOBENZAPRINE HCL 10 MG PO TABS Oral Take 10 mg by mouth 3 (three) times daily as needed.    . DULOXETINE HCL 60 MG PO CPEP Oral Take 60 mg by mouth daily.    . CYCLOBENZAPRINE HCL 5 MG PO TABS Oral Take 1 tablet (5 mg total) by mouth 3 (three) times daily as needed for muscle spasms. 30 tablet 0  . DICLOFENAC POTASSIUM 50 MG PO TABS Oral Take 1 tablet (50 mg total) by mouth 3 (three) times daily. For back pain 30 tablet 0  . LISINOPRIL 10 MG PO TABS Oral Take 10 mg by mouth every morning.     [name]  LUBIPROSTONE 24 MCG PO CAPS Oral Take 24 mcg by mouth 2 (two) times daily as needed. constipation    . THERA M PLUS PO TABS Oral Take 1 tablet by mouth daily.     [name] NITROGLYCERIN 0.4 MG SL SUBL Sublingual Place 0.4 mg under the tongue every 5 (five) minutes as needed. Chest pain    . PREGABALIN 75 MG PO CAPS Oral Take 75 mg by mouth 2 (two) times daily.      . SERTRALINE HCL 50 MG PO TABS Oral Take 50 mg by mouth 2 (two) times daily.       BP 118/73  Pulse 101  Temp(Src) 98.4 F (36.9 C) (Oral)  Resp 16  SpO2 95%  LMP 08/04/2011  Physical Exam  Nursing note and vitals reviewed. Constitutional: She is oriented to person, place, and time. She appears well-developed and  well-nourished.  Abdominal: Soft. Bowel sounds are normal. There is no tenderness.  Musculoskeletal: She exhibits tenderness.       Lumbar back: She exhibits decreased range of motion, tenderness and spasm. She exhibits no bony tenderness, no swelling, no edema and normal pulse.       Back:  Neurological: She is alert and oriented to person, place, and time.  Skin: Skin is warm and dry.    ED Course  Procedures (including critical care time)  Labs Reviewed - No data to display Dg Lumbar Spine Complete  02/08/2012  *RADIOLOGY REPORT*  Clinical Data: Back injury.  Low back pain radiating to both legs.  LUMBAR SPINE - COMPLETE 4+ VIEW  Comparison:  None.  Findings:  There is no evidence of lumbar spine fracture. Alignment is normal.  Intervertebral disc spaces are maintained.  IMPRESSION: Negative.  Original Report Authenticated By: Danae Orleans, M.D.     1. Lumbar strain, initial encounter       MDM          Linna Hoff, MD 02/08/12 1215

## 2012-02-28 ENCOUNTER — Encounter (HOSPITAL_COMMUNITY): Payer: Self-pay | Admitting: *Deleted

## 2012-02-28 ENCOUNTER — Emergency Department (INDEPENDENT_AMBULATORY_CARE_PROVIDER_SITE_OTHER)
Admission: EM | Admit: 2012-02-28 | Discharge: 2012-02-28 | Disposition: A | Discharge status: Home / Self Care | Payer: BC Managed Care – PPO | Source: Home / Self Care | Attending: Family Medicine | Admitting: Family Medicine

## 2012-02-28 DIAGNOSIS — M545 Low back pain, unspecified: Secondary | ICD-10-CM

## 2012-02-28 HISTORY — DX: Dorsalgia, unspecified: M54.9

## 2012-02-28 HISTORY — DX: Other chronic pain: G89.29

## 2012-02-28 LAB — POCT URINALYSIS DIP (DEVICE)
Bilirubin Urine: NEGATIVE
Ketones, ur: NEGATIVE mg/dL
Protein, ur: NEGATIVE mg/dL
Specific Gravity, Urine: 1.025 (ref 1.005–1.030)

## 2012-02-28 MED ORDER — HYDROCODONE-ACETAMINOPHEN 5-325 MG PO TABS: ORAL_TABLET | ORAL | Status: DC

## 2012-02-28 MED ORDER — PREDNISONE (PAK) 10 MG PO TABS: ORAL_TABLET | ORAL | Status: DC

## 2012-02-28 NOTE — ED Provider Notes (Signed)
History     CSN: [id number]  Arrival date & time 02/28/12  1141   First MD Initiated Contact with Patient 02/28/12 1234      Chief Complaint  Patient presents with  . Back Pain    (Consider location/radiation/quality/duration/timing/severity/associated sxs/prior treatment) The history is provided by the patient.    Past Medical History  Diagnosis Date  . Fibromyalgia     PT ON LYRIC   . Hypertension     HX OF ELEVATED B/P FOR SEVERAL YRS--NO MEDS UNTIL LAST WEEK--PT WAS C/O OF CHEST PRESSURE AND B/P ELEVATED--PT SAW NURSE PRACTIONER ON 08/21/11 AND STATES EKG WAS DONE--TOLD ABNORMAL--PUT ON B/P MEDICATION LISINOPRIL AND TOLD SHE WAS TO SEE A CARDIOLOGIST.  [name] Shortness of breath     WITH EXERTION--NEW FOR PT--PT ALSO REPORTS FEELING TIRED  . GERD (gastroesophageal reflux disease)     NOT ON ANY MEDS AT PRESENT  . Headache     HX OF MIGRAINES  . Anxiety     HX OF ANXIETY ATTACKS  . Back pain, chronic     Past Surgical History  Procedure Date  . Tubal ligation   . Constipation   . Robotic assisted lap vaginal hysterectomy 09/14/2011    Procedure: ROBOTIC ASSISTED LAPAROSCOPIC VAGINAL HYSTERECTOMY;  Surgeon: Roseanna Rainbow, MD;  Location: [facility];  Service: Gynecology;  Laterality: N/A;    Family History  Problem Relation Age of Onset  . Family history unknown: Yes    History  Substance Use Topics  . Smoking status: Never Smoker   . Smokeless tobacco: Never Used  . Alcohol Use: No    OB History    Grav Para Term Preterm Abortions TAB SAB Ect Mult Living                  Review of Systems  Constitutional: Negative.   HENT: Negative.   Respiratory: Negative.   Cardiovascular: Negative.   Gastrointestinal: Negative.   Genitourinary: Negative.     Allergies  Review of patient's allergies indicates no known allergies.  Home Medications   Current Outpatient Rx  Name Route Sig Dispense Refill  . CYCLOBENZAPRINE HCL 10 MG PO TABS Oral Take 10 mg by  mouth 3 (three) times daily as needed.    [name] DICLOFENAC POTASSIUM 50 MG PO TABS Oral Take 1 tablet (50 mg total) by mouth 3 (three) times daily. For back pain 30 tablet 0  . LISINOPRIL 10 MG PO TABS Oral Take 10 mg by mouth every morning.     . LUBIPROSTONE 24 MCG PO CAPS Oral Take 24 mcg by mouth 2 (two) times daily as needed. constipation    . THERA M PLUS PO TABS Oral Take 1 tablet by mouth daily.     . DULOXETINE HCL 60 MG PO CPEP Oral Take 60 mg by mouth daily.    [name] HYDROCODONE-ACETAMINOPHEN 5-325 MG PO TABS  1 tab po q 8 hrs prn pain 12 tablet 0  . NITROGLYCERIN 0.4 MG SL SUBL Sublingual Place 0.4 mg under the tongue every 5 (five) minutes as needed. Chest pain    . PREDNISONE (PAK) 10 MG PO TABS  Disp a 6 day taper Take as directed with food 21 tablet 0  . PREGABALIN 75 MG PO CAPS Oral Take 75 mg by mouth 2 (two) times daily.      . SERTRALINE HCL 50 MG PO TABS Oral Take 50 mg by mouth 2 (two) times daily.  BP 103/65  Pulse 97  Temp 98.9 F (37.2 C) (Oral)  Resp 16  SpO2 99%  LMP 08/04/2011  Physical Exam  Nursing note and vitals reviewed. Constitutional: She appears well-developed and well-nourished. No distress.  HENT:  Head: Normocephalic and atraumatic.  Cardiovascular: Normal rate and regular rhythm.   Pulmonary/Chest: Effort normal and breath sounds normal.  Abdominal: Soft. Bowel sounds are normal.  Musculoskeletal:       Pain l/s junction. Pain with flex to 45 degrees. Pain with sbl and rotation l. Heel toe walk intact. Sensory intact. dtr symmetrical.     ED Course  Procedures (including critical care time)  Labs Reviewed  POCT URINALYSIS DIP (DEVICE) - Abnormal; Notable for the following:    Hgb urine dipstick TRACE (*)     All other components within normal limits   No results found.   1. Low back pain       MDM          Randa Spike, MD 02/28/12 1341

## 2012-02-28 NOTE — Discharge Instructions (Signed)
Apply heat three times daily x 15 min. Call ortho for appt/

## 2012-02-28 NOTE — ED Notes (Signed)
Pt ambulatory to bathroom for ua without assistance

## 2012-02-28 NOTE — ED Notes (Signed)
Pt reports chronic back pain for the past 2 years with no relief - pt has seen primary md and has been treated here in the past week. Reports trying otc meds, exercise with no relief - would like to be referred to specialist before insurance ends

## 2012-09-21 ENCOUNTER — Emergency Department (HOSPITAL_COMMUNITY): Payer: BC Managed Care – PPO

## 2012-09-21 ENCOUNTER — Emergency Department (HOSPITAL_COMMUNITY)
Admission: EM | Admit: 2012-09-21 | Discharge: 2012-09-21 | Disposition: A | Discharge status: Home / Self Care | Payer: BC Managed Care – PPO | Attending: Emergency Medicine | Admitting: Emergency Medicine

## 2012-09-21 ENCOUNTER — Encounter (HOSPITAL_COMMUNITY): Payer: Self-pay | Admitting: *Deleted

## 2012-09-21 DIAGNOSIS — Z79899 Other long term (current) drug therapy: Secondary | ICD-10-CM | POA: Insufficient documentation

## 2012-09-21 DIAGNOSIS — R112 Nausea with vomiting, unspecified: Secondary | ICD-10-CM | POA: Insufficient documentation

## 2012-09-21 DIAGNOSIS — Z8719 Personal history of other diseases of the digestive system: Secondary | ICD-10-CM | POA: Insufficient documentation

## 2012-09-21 DIAGNOSIS — N201 Calculus of ureter: Secondary | ICD-10-CM

## 2012-09-21 DIAGNOSIS — G8929 Other chronic pain: Secondary | ICD-10-CM | POA: Insufficient documentation

## 2012-09-21 DIAGNOSIS — Z8659 Personal history of other mental and behavioral disorders: Secondary | ICD-10-CM | POA: Insufficient documentation

## 2012-09-21 DIAGNOSIS — IMO0001 Reserved for inherently not codable concepts without codable children: Secondary | ICD-10-CM | POA: Insufficient documentation

## 2012-09-21 DIAGNOSIS — I10 Essential (primary) hypertension: Secondary | ICD-10-CM | POA: Insufficient documentation

## 2012-09-21 DIAGNOSIS — Z8679 Personal history of other diseases of the circulatory system: Secondary | ICD-10-CM | POA: Insufficient documentation

## 2012-09-21 DIAGNOSIS — Z8709 Personal history of other diseases of the respiratory system: Secondary | ICD-10-CM | POA: Insufficient documentation

## 2012-09-21 DIAGNOSIS — M549 Dorsalgia, unspecified: Secondary | ICD-10-CM | POA: Insufficient documentation

## 2012-09-21 LAB — COMPREHENSIVE METABOLIC PANEL
BUN: 15 mg/dL (ref 6–23)
CO2: 25 mEq/L (ref 19–32)
Calcium: 9.2 mg/dL (ref 8.4–10.5)
Chloride: 104 mEq/L (ref 96–112)
Creatinine, Ser: 0.62 mg/dL (ref 0.50–1.10)
GFR calc Af Amer: >90 mL/min (ref >90)
GFR calc non Af Amer: >90 mL/min (ref >90)
Glucose, Bld: 106 mg/dL — ABNORMAL HIGH (ref 70–99)
Total Bilirubin: 0.5 mg/dL (ref 0.3–1.2)

## 2012-09-21 LAB — CBC WITH DIFFERENTIAL/PLATELET
Eosinophils Relative: 0 % (ref 0–5)
HCT: 38.1 % (ref 36.0–46.0)
Hemoglobin: 12.9 g/dL (ref 12.0–15.0)
Lymphocytes Relative: 12 % (ref 12–46)
Lymphs Abs: 1 10*3/uL (ref 0.7–4.0)
MCV: 91.8 fL (ref 78.0–100.0)
Monocytes Absolute: 0.5 10*3/uL (ref 0.1–1.0)
Monocytes Relative: 6 % (ref 3–12)
RBC: 4.15 MIL/uL (ref 3.87–5.11)
RDW: 11.8 % (ref 11.5–15.5)
WBC: 8.5 10*3/uL (ref 4.0–10.5)

## 2012-09-21 LAB — URINALYSIS, ROUTINE W REFLEX MICROSCOPIC
Nitrite: POSITIVE — AB
Protein, ur: NEGATIVE mg/dL
Specific Gravity, Urine: 1.015 (ref 1.005–1.030)
Urobilinogen, UA: 1 mg/dL (ref 0.0–1.0)

## 2012-09-21 LAB — URINE MICROSCOPIC-ADD ON

## 2012-09-21 MED ORDER — KETOROLAC TROMETHAMINE 30 MG/ML IJ SOLN
30.0000 mg | Freq: Once | INTRAMUSCULAR | Status: AC
  Administered 2012-09-21: 30 mg via INTRAVENOUS
  Filled 2012-09-21: qty 1

## 2012-09-21 MED ORDER — ONDANSETRON HCL 4 MG/2ML IJ SOLN
4.0000 mg | Freq: Once | INTRAMUSCULAR | Status: AC
  Administered 2012-09-21: 4 mg via INTRAVENOUS
  Filled 2012-09-21: qty 2

## 2012-09-21 MED ORDER — IOHEXOL 300 MG/ML  SOLN
80.0000 mL | Freq: Once | INTRAMUSCULAR | Status: AC | PRN
  Administered 2012-09-21: 80 mL via INTRAVENOUS

## 2012-09-21 MED ORDER — TAMSULOSIN HCL 0.4 MG PO CAPS: 0.4000 mg | ORAL_CAPSULE | Freq: Every day | ORAL | Status: DC

## 2012-09-21 MED ORDER — OXYCODONE-ACETAMINOPHEN 5-325 MG PO TABS: 1.0000 | ORAL_TABLET | ORAL | Status: DC | PRN

## 2012-09-21 MED ORDER — HYDROMORPHONE HCL PF 1 MG/ML IJ SOLN
1.0000 mg | Freq: Once | INTRAMUSCULAR | Status: AC
  Administered 2012-09-21: 1 mg via INTRAVENOUS
  Filled 2012-09-21: qty 1

## 2012-09-21 NOTE — ED Notes (Signed)
Strainer given  

## 2012-09-21 NOTE — ED Notes (Signed)
Per EMS- pt began having RLQ and rt flank pain. Pt has had N/V as well this morning but none at this time. . Pt received of fentenyl en route. 20g in rt AC. VSS with EMS 136/84. HR 96. Resp 20.

## 2012-09-21 NOTE — ED Notes (Signed)
Patient transported to CT 

## 2012-09-21 NOTE — ED Provider Notes (Signed)
History     CSN: [id number]  Arrival date & time 09/21/12  0944   First MD Initiated Contact with Patient 09/21/12 1002      Chief Complaint  Patient presents with  . Flank Pain    (Consider location/radiation/quality/duration/timing/severity/associated sxs/prior treatment) HPI Comments: Patient is a 52 year old woman who complains of pain in the right flank and right lower quadrant of the abdomen. She had onset of this pain.7 this morning. The pain was felt in the right lower abdomen and in her right flank. He was quite severe, rated at a 9 initially. EMS was called and they gave her fentanyl 50 mcg intravenously, with some relief. She now rates the pain at a 7. She says that about 10 years ago she had had pain in the right side of her abdomen and was thought to be gallstones but she never had surgery. More recently she had a chest pain workup and had a negative cardiac cath in February of 2013.  Patient is a 52 y.o. female presenting with flank pain. The history is provided by the patient.  Flank Pain This is a new problem. The current episode started 3 to 5 hours ago. The problem occurs constantly. The problem has been gradually improving. Associated symptoms include abdominal pain. Pertinent negatives include no chest pain. Nothing aggravates the symptoms. The symptoms are relieved by medications. Treatments tried: Fentanyl 50 mcg intravenously, given by EMS. The treatment provided moderate relief.    Past Medical History  Diagnosis Date  . Fibromyalgia     PT ON LYRIC   . Hypertension     HX OF ELEVATED B/P FOR SEVERAL YRS--NO MEDS UNTIL LAST WEEK--PT WAS C/O OF CHEST PRESSURE AND B/P ELEVATED--PT SAW NURSE PRACTIONER ON 08/21/11 AND STATES EKG WAS DONE--TOLD ABNORMAL--PUT ON B/P MEDICATION LISINOPRIL AND TOLD SHE WAS TO SEE A CARDIOLOGIST.  [name] Shortness of breath     WITH EXERTION--NEW FOR PT--PT ALSO REPORTS FEELING TIRED  . GERD (gastroesophageal reflux disease)     NOT ON ANY  MEDS AT PRESENT  . Headache     HX OF MIGRAINES  . Anxiety     HX OF ANXIETY ATTACKS  . Back pain, chronic     Past Surgical History  Procedure Date  . Tubal ligation   . Constipation   . Robotic assisted lap vaginal hysterectomy 09/14/2011    Procedure: ROBOTIC ASSISTED LAPAROSCOPIC VAGINAL HYSTERECTOMY;  Surgeon: Roseanna Rainbow, MD;  Location: [facility];  Service: Gynecology;  Laterality: N/A;    No family history on file.  History  Substance Use Topics  . Smoking status: Never Smoker   . Smokeless tobacco: Never Used  . Alcohol Use: No    OB History    Grav Para Term Preterm Abortions TAB SAB Ect Mult Living                  Review of Systems  Constitutional: Negative for fever and chills.  HENT: Negative.   Eyes: Negative.   Respiratory: Negative.   Cardiovascular: Negative.  Negative for chest pain.  Gastrointestinal: Positive for nausea, vomiting and abdominal pain.  Genitourinary: Positive for flank pain.  Musculoskeletal: Negative.   Skin: Negative.   Neurological: Negative.   Psychiatric/Behavioral: Negative.     Allergies  Review of patient's allergies indicates no known allergies.  Home Medications   Current Outpatient Rx  Name  Route  Sig  Dispense  Refill  . DULOXETINE HCL 60 MG PO CPEP   Oral  Take 60 mg by mouth daily.         [name] LISINOPRIL 10 MG PO TABS   Oral   Take 10 mg by mouth every morning.          [name] MAGNESIUM PO   Oral   Take 1 tablet by mouth daily.         [name] NITROGLYCERIN 0.4 MG SL SUBL   Sublingual   Place 0.4 mg under the tongue every 5 (five) minutes as needed. Chest pain           BP 117/76  Pulse 67  Temp 97.9 F (36.6 C) (Oral)  Resp 26  SpO2 100%  LMP 08/04/2011  Physical Exam  Nursing note and vitals reviewed. Constitutional: She is oriented to person, place, and time. She appears well-developed and well-nourished.       In moderate distress with right flank and right lower quadrant abdominal  pain.  HENT:  Head: Normocephalic and atraumatic.  Right Ear: External ear normal.  Left Ear: External ear normal.  Mouth/Throat: Oropharynx is clear and moist.  Eyes: Conjunctivae normal and EOM are normal. Pupils are equal, round, and reactive to light. No scleral icterus.  Neck: Normal range of motion. Neck supple.  Cardiovascular: Normal rate, regular rhythm and normal heart sounds.   Pulmonary/Chest: Effort normal and breath sounds normal.  Abdominal: Soft. Bowel sounds are normal.       She localizes pain to the right flank and right lower abdominal quadrant. There is no palpable deformity or point tenderness.  Musculoskeletal: Normal range of motion.  Neurological: She is alert and oriented to person, place, and time.       No sensory or motor deficit.  Skin: Skin is warm and dry.  Psychiatric: She has a normal mood and affect. Her behavior is normal.    ED Course  Procedures (including critical care time)  10:03 AM  Date: 09/21/2012  Rate:64  Rhythm: normal sinus rhythm  QRS Axis: normal  Intervals: PR shortened  ST/T Wave abnormalities: normal  Conduction Disutrbances:  Shortened PR interval.  Narrative Interpretation: Borderline EKG  Old EKG Reviewed: changes noted--inverted T waves seen on tracing of 10/29/2011 have normalized.  10:26 AM Patient was seen and had physical examination. IV medications for pain and nausea were ordered. Laboratory testing was ordered. CT the abdomen and pelvis without contrast was ordered.  11:49 AM Reviewed lab and x-ray results with pt and her family.  She has a 4 mm right proximal ureteral stone.  She also has gallstones but no evidence of cholecystitis.  And she has a RLL pulmonary nodule, for with chest CT was recommended.  Chest CT with IV contrast ordered.  1:37 PM CT of chest showed small pulmonary nodules.  She is a nonsmoker, so her risk that these could be malignant is low.  Rx for kidney stone with Percocet for pain, Flomax to  facilitate stone passage, referral to Urology with Dr. Isabel Caprice on call.   1. Right ureteral calculus            Carleene Cooper III, MD 09/21/12 1353

## 2012-09-23 ENCOUNTER — Encounter (HOSPITAL_COMMUNITY)
Admission: EM | Disposition: A | Discharge status: Home / Self Care | Payer: Self-pay | Source: Home / Self Care | Attending: Emergency Medicine

## 2012-09-23 ENCOUNTER — Observation Stay (HOSPITAL_COMMUNITY)
Admission: EM | Admit: 2012-09-23 | Discharge: 2012-09-24 | Disposition: A | Discharge status: Home / Self Care | Payer: BC Managed Care – PPO | Attending: Urology | Admitting: Urology

## 2012-09-23 ENCOUNTER — Encounter (HOSPITAL_COMMUNITY): Payer: Self-pay | Admitting: Anesthesiology

## 2012-09-23 ENCOUNTER — Emergency Department (HOSPITAL_COMMUNITY): Payer: BC Managed Care – PPO | Admitting: Anesthesiology

## 2012-09-23 ENCOUNTER — Encounter (HOSPITAL_COMMUNITY): Payer: Self-pay | Admitting: Emergency Medicine

## 2012-09-23 DIAGNOSIS — N201 Calculus of ureter: Principal | ICD-10-CM | POA: Insufficient documentation

## 2012-09-23 DIAGNOSIS — N1330 Unspecified hydronephrosis: Secondary | ICD-10-CM | POA: Insufficient documentation

## 2012-09-23 DIAGNOSIS — I10 Essential (primary) hypertension: Secondary | ICD-10-CM | POA: Insufficient documentation

## 2012-09-23 DIAGNOSIS — N12 Tubulo-interstitial nephritis, not specified as acute or chronic: Secondary | ICD-10-CM

## 2012-09-23 DIAGNOSIS — Z79899 Other long term (current) drug therapy: Secondary | ICD-10-CM | POA: Insufficient documentation

## 2012-09-23 DIAGNOSIS — K219 Gastro-esophageal reflux disease without esophagitis: Secondary | ICD-10-CM | POA: Insufficient documentation

## 2012-09-23 DIAGNOSIS — IMO0001 Reserved for inherently not codable concepts without codable children: Secondary | ICD-10-CM | POA: Insufficient documentation

## 2012-09-23 DIAGNOSIS — N390 Urinary tract infection, site not specified: Secondary | ICD-10-CM | POA: Insufficient documentation

## 2012-09-23 HISTORY — PX: CYSTOSCOPY W/ URETERAL STENT PLACEMENT: SHX1429

## 2012-09-23 LAB — CBC WITH DIFFERENTIAL/PLATELET
Basophils Absolute: 0 10*3/uL (ref 0.0–0.1)
Basophils Relative: 0 % (ref 0–1)
HCT: 34.2 % — ABNORMAL LOW (ref 36.0–46.0)
MCHC: 33.6 g/dL (ref 30.0–36.0)
Monocytes Absolute: 1 10*3/uL (ref 0.1–1.0)
Neutro Abs: 6 10*3/uL (ref 1.7–7.7)
Platelets: 185 10*3/uL (ref 150–400)
RDW: 11.7 % (ref 11.5–15.5)

## 2012-09-23 LAB — URINE MICROSCOPIC-ADD ON

## 2012-09-23 LAB — URINALYSIS, ROUTINE W REFLEX MICROSCOPIC
Glucose, UA: NEGATIVE mg/dL
Ketones, ur: 15 mg/dL — AB
Protein, ur: NEGATIVE mg/dL
Urobilinogen, UA: 1 mg/dL (ref 0.0–1.0)

## 2012-09-23 LAB — URINE CULTURE: Colony Count: >=100000

## 2012-09-23 LAB — BASIC METABOLIC PANEL
Calcium: 8.6 mg/dL (ref 8.4–10.5)
Chloride: 102 mEq/L (ref 96–112)
Creatinine, Ser: 0.72 mg/dL (ref 0.50–1.10)
GFR calc Af Amer: >90 mL/min (ref >90)

## 2012-09-23 SURGERY — CYSTOSCOPY, WITH RETROGRADE PYELOGRAM AND URETERAL STENT INSERTION
Anesthesia: General | Laterality: Right | Wound class: Dirty or Infected

## 2012-09-23 MED ORDER — DOCUSATE SODIUM 100 MG PO CAPS
100.0000 mg | ORAL_CAPSULE | Freq: Two times a day (BID) | ORAL | Status: DC
  Administered 2012-09-23 – 2012-09-24 (×2): 100 mg via ORAL
  Filled 2012-09-23 (×3): qty 1

## 2012-09-23 MED ORDER — OXYCODONE HCL 5 MG PO TABS: 5.0000 mg | ORAL_TABLET | Freq: Once | ORAL | Status: DC | PRN

## 2012-09-23 MED ORDER — TAMSULOSIN HCL 0.4 MG PO CAPS
0.4000 mg | ORAL_CAPSULE | Freq: Every day | ORAL | Status: DC
  Administered 2012-09-23 – 2012-09-24 (×2): 0.4 mg via ORAL
  Filled 2012-09-23 (×2): qty 1

## 2012-09-23 MED ORDER — KCL IN DEXTROSE-NACL 20-5-0.45 MEQ/L-%-% IV SOLN
INTRAVENOUS | Status: DC
  Administered 2012-09-23 – 2012-09-24 (×2): via INTRAVENOUS
  Filled 2012-09-23 (×3): qty 1000

## 2012-09-23 MED ORDER — SODIUM CHLORIDE 0.9 % IV BOLUS (SEPSIS)
1000.0000 mL | Freq: Once | INTRAVENOUS | Status: AC
  Administered 2012-09-23: 1000 mL via INTRAVENOUS

## 2012-09-23 MED ORDER — HYDROMORPHONE HCL PF 1 MG/ML IJ SOLN
1.0000 mg | Freq: Once | INTRAMUSCULAR | Status: AC
  Administered 2012-09-23: 1 mg via INTRAVENOUS
  Filled 2012-09-23: qty 1

## 2012-09-23 MED ORDER — IOHEXOL 300 MG/ML  SOLN
INTRAMUSCULAR | Status: AC
  Filled 2012-09-23: qty 1

## 2012-09-23 MED ORDER — OXYBUTYNIN CHLORIDE ER 10 MG PO TB24
10.0000 mg | ORAL_TABLET | Freq: Every day | ORAL | Status: DC
  Administered 2012-09-23 – 2012-09-24 (×2): 10 mg via ORAL
  Filled 2012-09-23 (×2): qty 1

## 2012-09-23 MED ORDER — DEXAMETHASONE SODIUM PHOSPHATE 10 MG/ML IJ SOLN
INTRAMUSCULAR | Status: DC | PRN
  Administered 2012-09-23: 10 mg via INTRAVENOUS

## 2012-09-23 MED ORDER — PROPOFOL 10 MG/ML IV BOLUS
INTRAVENOUS | Status: DC | PRN
  Administered 2012-09-23: 150 mg via INTRAVENOUS

## 2012-09-23 MED ORDER — ACETAMINOPHEN 10 MG/ML IV SOLN: 1000.0000 mg | Freq: Once | INTRAVENOUS | Status: DC | PRN

## 2012-09-23 MED ORDER — INFLUENZA VIRUS VACC SPLIT PF IM SUSP
0.5000 mL | INTRAMUSCULAR | Status: AC
  Administered 2012-09-24: 0.5 mL via INTRAMUSCULAR
  Filled 2012-09-23 (×2): qty 0.5

## 2012-09-23 MED ORDER — DEXTROSE 5 % IV SOLN
1.0000 g | INTRAVENOUS | Status: DC
  Administered 2012-09-24: 1 g via INTRAVENOUS
  Filled 2012-09-23: qty 10

## 2012-09-23 MED ORDER — ONDANSETRON HCL 4 MG/2ML IJ SOLN
4.0000 mg | INTRAMUSCULAR | Status: DC | PRN
  Administered 2012-09-24: 4 mg via INTRAVENOUS
  Filled 2012-09-23: qty 2

## 2012-09-23 MED ORDER — OXYCODONE HCL 5 MG PO TABS
5.0000 mg | ORAL_TABLET | ORAL | Status: DC | PRN
  Administered 2012-09-23: 5 mg via ORAL
  Filled 2012-09-23: qty 1

## 2012-09-23 MED ORDER — LACTATED RINGERS IV SOLN
INTRAVENOUS | Status: DC | PRN
  Administered 2012-09-23: 17:00:00 via INTRAVENOUS

## 2012-09-23 MED ORDER — DULOXETINE HCL 60 MG PO CPEP
60.0000 mg | ORAL_CAPSULE | Freq: Every day | ORAL | Status: DC
  Administered 2012-09-23 – 2012-09-24 (×2): 60 mg via ORAL
  Filled 2012-09-23 (×2): qty 1

## 2012-09-23 MED ORDER — LISINOPRIL 10 MG PO TABS
10.0000 mg | ORAL_TABLET | Freq: Every day | ORAL | Status: DC
  Administered 2012-09-23 – 2012-09-24 (×2): 10 mg via ORAL
  Filled 2012-09-23 (×2): qty 1

## 2012-09-23 MED ORDER — DIATRIZOATE MEGLUMINE 30 % UR SOLN
URETHRAL | Status: DC | PRN
  Administered 2012-09-23: 6 mL

## 2012-09-23 MED ORDER — ONDANSETRON HCL 4 MG/2ML IJ SOLN
4.0000 mg | Freq: Once | INTRAMUSCULAR | Status: AC
  Administered 2012-09-23: 4 mg via INTRAVENOUS
  Filled 2012-09-23: qty 2

## 2012-09-23 MED ORDER — SENNA 8.6 MG PO TABS
1.0000 | ORAL_TABLET | Freq: Two times a day (BID) | ORAL | Status: DC
  Administered 2012-09-23 – 2012-09-24 (×2): 8.6 mg via ORAL
  Filled 2012-09-23 (×2): qty 1

## 2012-09-23 MED ORDER — HYDROMORPHONE HCL PF 1 MG/ML IJ SOLN
0.5000 mg | INTRAMUSCULAR | Status: DC | PRN
  Administered 2012-09-23 (×2): 1 mg via INTRAVENOUS
  Filled 2012-09-23 (×2): qty 1

## 2012-09-23 MED ORDER — CEFTRIAXONE SODIUM 1 G IJ SOLR
1.0000 g | Freq: Once | INTRAMUSCULAR | Status: AC
  Administered 2012-09-23: 1 g via INTRAVENOUS
  Filled 2012-09-23: qty 10

## 2012-09-23 MED ORDER — NITROGLYCERIN 0.4 MG SL SUBL: 0.4000 mg | SUBLINGUAL_TABLET | SUBLINGUAL | Status: DC | PRN

## 2012-09-23 MED ORDER — MEPERIDINE HCL 50 MG/ML IJ SOLN: 6.2500 mg | INTRAMUSCULAR | Status: DC | PRN

## 2012-09-23 MED ORDER — FENTANYL CITRATE 0.05 MG/ML IJ SOLN
INTRAMUSCULAR | Status: AC
  Administered 2012-09-23: 50 ug
  Filled 2012-09-23: qty 2

## 2012-09-23 MED ORDER — ACETAMINOPHEN 325 MG PO TABS
650.0000 mg | ORAL_TABLET | Freq: Once | ORAL | Status: AC
  Administered 2012-09-23: 650 mg via ORAL
  Filled 2012-09-23: qty 2

## 2012-09-23 MED ORDER — PROMETHAZINE HCL 25 MG/ML IJ SOLN: 6.2500 mg | INTRAMUSCULAR | Status: DC | PRN

## 2012-09-23 MED ORDER — SODIUM CHLORIDE 0.9 % IR SOLN
Status: DC | PRN
  Administered 2012-09-23: 3000 mL via INTRAVESICAL

## 2012-09-23 MED ORDER — LISINOPRIL-HYDROCHLOROTHIAZIDE 10-12.5 MG PO TABS: 1.0000 | ORAL_TABLET | Freq: Every day | ORAL | Status: DC

## 2012-09-23 MED ORDER — HEPARIN SODIUM (PORCINE) 5000 UNIT/ML IJ SOLN
5000.0000 [IU] | Freq: Three times a day (TID) | INTRAMUSCULAR | Status: DC
  Administered 2012-09-23 – 2012-09-24 (×3): 5000 [IU] via SUBCUTANEOUS
  Filled 2012-09-23 (×5): qty 1

## 2012-09-23 MED ORDER — ONDANSETRON HCL 4 MG/2ML IJ SOLN
INTRAMUSCULAR | Status: DC | PRN
  Administered 2012-09-23: 4 mg via INTRAVENOUS

## 2012-09-23 MED ORDER — OXYCODONE HCL 5 MG/5ML PO SOLN
5.0000 mg | Freq: Once | ORAL | Status: DC | PRN
  Filled 2012-09-23: qty 5

## 2012-09-23 MED ORDER — ACETAMINOPHEN 325 MG PO TABS: 650.0000 mg | ORAL_TABLET | ORAL | Status: DC | PRN

## 2012-09-23 MED ORDER — FENTANYL CITRATE 0.05 MG/ML IJ SOLN
INTRAMUSCULAR | Status: DC | PRN
  Administered 2012-09-23: 100 ug via INTRAVENOUS

## 2012-09-23 MED ORDER — SUCCINYLCHOLINE CHLORIDE 20 MG/ML IJ SOLN
INTRAMUSCULAR | Status: DC | PRN
  Administered 2012-09-23: 100 mg via INTRAVENOUS

## 2012-09-23 MED ORDER — HYDROCHLOROTHIAZIDE 12.5 MG PO CAPS
12.5000 mg | ORAL_CAPSULE | Freq: Every day | ORAL | Status: DC
  Administered 2012-09-23 – 2012-09-24 (×2): 12.5 mg via ORAL
  Filled 2012-09-23 (×2): qty 1

## 2012-09-23 MED ORDER — HYDROMORPHONE HCL PF 1 MG/ML IJ SOLN: 0.2500 mg | INTRAMUSCULAR | Status: DC | PRN

## 2012-09-23 MED ORDER — BELLADONNA ALKALOIDS-OPIUM 16.2-60 MG RE SUPP
RECTAL | Status: AC
  Filled 2012-09-23: qty 1

## 2012-09-23 SURGICAL SUPPLY — 17 items
ADAPTER CATH URET PLST 4-6FR (CATHETERS) ×2 IMPLANT
ADPR CATH URET STRL DISP 4-6FR (CATHETERS) ×1
BAG URO CATCHER STRL LF (DRAPE) ×2 IMPLANT
BASKET ZERO TIP NITINOL 2.4FR (BASKET) IMPLANT
BSKT STON RTRVL ZERO TP 2.4FR (BASKET)
CATH INTERMIT  6FR 70CM (CATHETERS) ×1 IMPLANT
CLOTH BEACON ORANGE TIMEOUT ST (SAFETY) ×2 IMPLANT
DRAPE CAMERA CLOSED 9X96 (DRAPES) ×2 IMPLANT
GLOVE BIOGEL M STRL SZ7.5 (GLOVE) ×2 IMPLANT
GOWN STRL NON-REIN LRG LVL3 (GOWN DISPOSABLE) ×2 IMPLANT
GOWN STRL REIN XL XLG (GOWN DISPOSABLE) ×2 IMPLANT
GUIDEWIRE ANG ZIPWIRE 038X150 (WIRE) IMPLANT
GUIDEWIRE STR DUAL SENSOR (WIRE) ×2 IMPLANT
MANIFOLD NEPTUNE II (INSTRUMENTS) ×2 IMPLANT
PACK CYSTO (CUSTOM PROCEDURE TRAY) ×2 IMPLANT
STENT CONTOUR 6FRX24X.038 (STENTS) ×1 IMPLANT
TUBING CONNECTING 10 (TUBING) ×2 IMPLANT

## 2012-09-23 NOTE — ED Notes (Signed)
Consent for procedure signed.

## 2012-09-23 NOTE — ED Notes (Signed)
Care transferred and report given to Elizabeth, RN 

## 2012-09-23 NOTE — Preoperative (Signed)
Beta Blockers   Reason not to administer Beta Blockers:Not Applicable 

## 2012-09-23 NOTE — ED Notes (Signed)
Patient from [facility] with diagnosis of kidney stones and possibly flu.  Patient in pain right flank area which she rates as an 8.  Also has fever of 103.  Pt has had runny nose, denies coughing or vomiting.

## 2012-09-23 NOTE — Anesthesia Preprocedure Evaluation (Addendum)
Anesthesia Evaluation  Patient identified by MRN, date of birth, ID band Patient awake    Reviewed: Allergy & Precautions, H&P , NPO status , Patient's Chart, lab work & pertinent test results  Airway Mallampati: III TM Distance: >3 FB Neck ROM: Full    Dental  (+) Dental Advisory Given and Teeth Intact   Pulmonary shortness of breath,  breath sounds clear to auscultation  Pulmonary exam normal       Cardiovascular hypertension, Pt. on medications - Past MI Rhythm:Regular Rate:Normal     Neuro/Psych  Headaches, Anxiety  Neuromuscular disease    GI/Hepatic Neg liver ROS, GERD-  Medicated,  Endo/Other  negative endocrine ROS  Renal/GU negative Renal ROS     Musculoskeletal  (+) Fibromyalgia -  Abdominal   Peds  Hematology negative hematology ROS (+)   Anesthesia Other Findings   Reproductive/Obstetrics                          Anesthesia Physical Anesthesia Plan  ASA: II and emergent  Anesthesia Plan: General   Post-op Pain Management:    Induction: Intravenous  Airway Management Planned: LMA  Additional Equipment:   Intra-op Plan:   Post-operative Plan: Extubation in OR  Informed Consent: I have reviewed the patients History and Physical, chart, labs and discussed the procedure including the risks, benefits and alternatives for the proposed anesthesia with the patient or authorized representative who has indicated his/her understanding and acceptance.   Dental advisory given  Plan Discussed with: CRNA  Anesthesia Plan Comments:       Anesthesia Quick Evaluation

## 2012-09-23 NOTE — Anesthesia Postprocedure Evaluation (Signed)
Anesthesia Post Note  Patient: Lisa Casey  Procedure(s) Performed: Procedure(s) (LRB): CYSTOSCOPY WITH RETROGRADE PYELOGRAM/URETERAL STENT PLACEMENT (Right)  Anesthesia type: General  Patient location: PACU  Post pain: Pain level controlled  Post assessment: Post-op Vital signs reviewed  Last Vitals: BP 104/55  Pulse 87  Temp 37.9 C (Oral)  Resp 16  Ht 5\' 5"  (1.651 m)  Wt 168 lb (76.204 kg)  BMI 27.96 kg/m2  SpO2 98%  LMP 08/04/2011  Post vital signs: Reviewed  Level of consciousness: sedated  Complications: No apparent anesthesia complications

## 2012-09-23 NOTE — ED Notes (Signed)
Patient claims seen for kidney stones on Sunday.  Patient claims she is unsure if she passed the stone, but still has pain.  Patient claims has been having body aches and headache and runny nose since.

## 2012-09-23 NOTE — ED Provider Notes (Signed)
Pt with fever/chills, uti noted and recent ureteral stone PA has arranged transfer to [place] to see dr Berneice Heinrich Pt stable for transfer  Joya Gaskins, MD 09/23/12 1444

## 2012-09-23 NOTE — ED Provider Notes (Signed)
History     CSN: [id number]  Arrival date & time 09/23/12  1007   First MD Initiated Contact with Patient 09/23/12 1039      Chief Complaint  Patient presents with  . Abdominal Pain  . Generalized Body Aches    (Consider location/radiation/quality/duration/timing/severity/associated sxs/prior treatment) HPI Comments: Patient with recent visit for right ureteral stone presents today with new symptoms of myalgias, fever to 101F, nasal congestion, fatigue, headache that began yesterday at about noon. Patient denies having a productive cough or shortness of breath. She has had nausea but no vomiting. Patient states that she is continuing to have right flank pain but this is much milder than it was at her previous visit. She has had some dysuria with urination and attributes this to the kidney stone. No vaginal bleeding or discharge. Patient has been taking the pain medication (percocet) for her kidney stone which has relieved her body aches temporarily, but they returned. Onset of symptoms acute. Course is constant. Nothing makes symptoms worse.  The history is provided by the patient and medical records.    Past Medical History  Diagnosis Date  . Fibromyalgia     PT ON LYRIC   . Hypertension     HX OF ELEVATED B/P FOR SEVERAL YRS--NO MEDS UNTIL LAST WEEK--PT WAS C/O OF CHEST PRESSURE AND B/P ELEVATED--PT SAW NURSE PRACTIONER ON 08/21/11 AND STATES EKG WAS DONE--TOLD ABNORMAL--PUT ON B/P MEDICATION LISINOPRIL AND TOLD SHE WAS TO SEE A CARDIOLOGIST.  [name] Shortness of breath     WITH EXERTION--NEW FOR PT--PT ALSO REPORTS FEELING TIRED  . GERD (gastroesophageal reflux disease)     NOT ON ANY MEDS AT PRESENT  . Headache     HX OF MIGRAINES  . Anxiety     HX OF ANXIETY ATTACKS  . Back pain, chronic     Past Surgical History  Procedure Date  . Tubal ligation   . Constipation   . Robotic assisted lap vaginal hysterectomy 09/14/2011    Procedure: ROBOTIC ASSISTED LAPAROSCOPIC VAGINAL  HYSTERECTOMY;  Surgeon: Roseanna Rainbow, MD;  Location: [facility];  Service: Gynecology;  Laterality: N/A;    No family history on file.  History  Substance Use Topics  . Smoking status: Never Smoker   . Smokeless tobacco: Never Used  . Alcohol Use: No    OB History    Grav Para Term Preterm Abortions TAB SAB Ect Mult Living                  Review of Systems  Constitutional: Positive for fever, chills and fatigue. Negative for appetite change.  HENT: Positive for congestion and rhinorrhea. Negative for ear pain and sore throat.   Eyes: Negative for redness.  Respiratory: Negative for cough and shortness of breath.   Cardiovascular: Negative for chest pain.  Gastrointestinal: Positive for nausea. Negative for vomiting, abdominal pain and diarrhea.  Genitourinary: Positive for dysuria and flank pain. Negative for hematuria, decreased urine volume, vaginal bleeding and vaginal discharge.  Musculoskeletal: Positive for myalgias.  Skin: Negative for rash.  Neurological: Positive for headaches.    Allergies  Review of patient's allergies indicates no known allergies.  Home Medications   Current Outpatient Rx  Name  Route  Sig  Dispense  Refill  . DULOXETINE HCL 60 MG PO CPEP   Oral   Take 60 mg by mouth daily.         . IBUPROFEN 200 MG PO TABS   Oral   Take  800 mg by mouth every 6 (six) hours as needed. For pain         . LISINOPRIL-HYDROCHLOROTHIAZIDE 10-12.5 MG PO TABS   Oral   Take 1 tablet by mouth daily.         [name] NITROGLYCERIN 0.4 MG SL SUBL   Sublingual   Place 0.4 mg under the tongue every 5 (five) minutes as needed. Chest pain         . OXYCODONE-ACETAMINOPHEN 5-325 MG PO TABS   Oral   Take 1 tablet by mouth every 4 (four) hours as needed. For pain         . TAMSULOSIN HCL 0.4 MG PO CAPS   Oral   Take 0.4 mg by mouth daily.           BP 137/77  Pulse 106  Temp 100.3 F (37.9 C) (Oral)  Resp 22  Ht 5\' 5"  (1.651 m)  Wt 168 lb  (76.204 kg)  BMI 27.96 kg/m2  SpO2 93%  LMP 08/04/2011  Physical Exam  Nursing note and vitals reviewed. Constitutional: She is oriented to person, place, and time. She appears well-developed and well-nourished.  HENT:  Head: Normocephalic and atraumatic.  Right Ear: Tympanic membrane, external ear and ear canal normal.  Left Ear: Tympanic membrane, external ear and ear canal normal.  Nose: Mucosal edema and rhinorrhea present.  Mouth/Throat: Uvula is midline and oropharynx is clear and moist. Mucous membranes are not dry.  Eyes: Conjunctivae normal are normal. Right eye exhibits no discharge. Left eye exhibits no discharge.  Neck: Normal range of motion. Neck supple.  Cardiovascular: Normal rate, regular rhythm and normal heart sounds.   Pulmonary/Chest: Effort normal and breath sounds normal. No respiratory distress.  Abdominal: Soft. There is no tenderness. There is no rebound and no guarding.  Neurological: She is alert and oriented to person, place, and time. She has normal strength. No sensory deficit. She exhibits normal muscle tone. Coordination normal. GCS eye subscore is 4. GCS verbal subscore is 5. GCS motor subscore is 6.  Skin: Skin is warm and dry.  Psychiatric: She has a normal mood and affect.    ED Course  Procedures (including critical care time)  Labs Reviewed  URINALYSIS, ROUTINE W REFLEX MICROSCOPIC - Abnormal; Notable for the following:    Hgb urine dipstick SMALL (*)     Ketones, ur 15 (*)     Leukocytes, UA MODERATE (*)     All other components within normal limits  CBC WITH DIFFERENTIAL - Abnormal; Notable for the following:    RBC 3.70 (*)     Hemoglobin 11.5 (*)     HCT 34.2 (*)     All other components within normal limits  BASIC METABOLIC PANEL - Abnormal; Notable for the following:    Glucose, Bld 104 (*)     All other components within normal limits  URINE MICROSCOPIC-ADD ON  URINE CULTURE   No results found.   1. Pyelonephritis   2.  Ureteral stone     10:48 AM Patient seen and examined. Work-up initiated. Medications ordered.   Vital signs reviewed and are as follows: Filed Vitals:   09/23/12 1013  BP: 137/77  Pulse: 106  Temp: 100.3 F (37.9 C)  Resp: 22   Pain and symptoms treated. Rocephin given. Patient d/w Dr. Bebe Shaggy. Given UA findings, patients symptoms likely urinary in etiology and not URI/influenza.   I have spoken with Dr. Berneice Heinrich who would like patient transferred to [facility]  ED to be seen and likely for stent placement.   Report called to Dr. Lynelle Doctor at [facility] ED.   3:18 PM [facility] has arrived and patient has left ED.    MDM  Recent stone, now with fever, dysuria, UTI findings on exam. Transfer to [facility] for urology intervention.         Renne Crigler, [place] 09/23/12 [phone]

## 2012-09-23 NOTE — Brief Op Note (Signed)
09/23/2012  6:01 PM  PATIENT:  Estill Batten  52 y.o. female  PRE-OPERATIVE DIAGNOSIS:  right ureteral stone, pyelonephritis  POST-OPERATIVE DIAGNOSIS:  right ureteral stone, pyelonephritis  PROCEDURE:  Procedure(s) (LRB) with comments: CYSTOSCOPY WITH RETROGRADE PYELOGRAM/URETERAL STENT PLACEMENT (Right)  SURGEON:  Surgeon(s) and Role:    * Sebastian Ache, MD - Primary  PHYSICIAN ASSISTANT:   ASSISTANTS: none   ANESTHESIA:   general  EBL:     BLOOD ADMINISTERED:none  DRAINS: none   LOCAL MEDICATIONS USED:  NONE  SPECIMEN:  No Specimen  DISPOSITION OF SPECIMEN:  N/A  COUNTS:  YES  TOURNIQUET:  * No tourniquets in log *  DICTATION: .Other Dictation: Dictation Number [id number]  PLAN OF CARE: Admit to inpatient   PATIENT DISPOSITION:  PACU - hemodynamically stable.   Delay start of Pharmacological VTE agent (>24hrs) due to surgical blood loss or risk of bleeding: no

## 2012-09-23 NOTE — ED Notes (Signed)
Pt transported to the OR by Gery Pray OR tech

## 2012-09-23 NOTE — ED Notes (Signed)
MD at bedside. 

## 2012-09-23 NOTE — Transfer of Care (Signed)
Immediate Anesthesia Transfer of Care Note  Patient: Lisa Casey  Procedure(s) Performed: Procedure(s) (LRB) with comments: CYSTOSCOPY WITH RETROGRADE PYELOGRAM/URETERAL STENT PLACEMENT (Right)  Patient Location: PACU  Anesthesia Type:General  Level of Consciousness: awake, alert , oriented and patient cooperative  Airway & Oxygen Therapy: Patient Spontanous Breathing and Patient connected to face mask oxygen  Post-op Assessment: Report given to PACU RN and Post -op Vital signs reviewed and stable  Post vital signs: Reviewed and stable  Complications: No apparent anesthesia complications

## 2012-09-23 NOTE — ED Notes (Signed)
[id number]<AV> Expected date:<BR> Expected time:<BR> Means of arrival:<BR> Comments:<BR> [facility] transfer--kidney stone

## 2012-09-23 NOTE — H&P (Signed)
Lisa Casey is an 52 y.o. female.   Chief Complaint: Rt Ureteral Stone, Hydronephrosis, Urosepsis HPI:   1 -  Rt Ureteral Stone, Hydronephrosis, Urosepsis - Pt with h/o 1 prior stone episode re-presents to [facility] ER for fever, nausea + emesis and refractory flank pain 2 days after ER visit for Rt flank pain showed new Rt proximal ureteral stone with hydro. UCX from 1/5 with pan-sensitive E. Coli. Fever here 103 with tachycardia. No interval stone passage. Last meal yesterday. She received IV rocephin in ER today. She is now transferred to [facility] ER for evaluation / treatment.  Denies h/o ischemic heart disease.   Past Medical History  Diagnosis Date  . Fibromyalgia     PT ON LYRIC   . Hypertension     HX OF ELEVATED B/P FOR SEVERAL YRS--NO MEDS UNTIL LAST WEEK--PT WAS C/O OF CHEST PRESSURE AND B/P ELEVATED--PT SAW NURSE PRACTIONER ON 08/21/11 AND STATES EKG WAS DONE--TOLD ABNORMAL--PUT ON B/P MEDICATION LISINOPRIL AND TOLD SHE WAS TO SEE A CARDIOLOGIST.  [name] Shortness of breath     WITH EXERTION--NEW FOR PT--PT ALSO REPORTS FEELING TIRED  . GERD (gastroesophageal reflux disease)     NOT ON ANY MEDS AT PRESENT  . Headache     HX OF MIGRAINES  . Anxiety     HX OF ANXIETY ATTACKS  . Back pain, chronic     Past Surgical History  Procedure Date  . Tubal ligation   . Constipation   . Robotic assisted lap vaginal hysterectomy 09/14/2011    Procedure: ROBOTIC ASSISTED LAPAROSCOPIC VAGINAL HYSTERECTOMY;  Surgeon: Roseanna Rainbow, MD;  Location: [facility];  Service: Gynecology;  Laterality: N/A;    No family history on file. Social History:  reports that she has never smoked. She has never used smokeless tobacco. She reports that she does not drink alcohol or use illicit drugs.  Allergies: No Known Allergies   (Not in a hospital admission)  Results for orders placed during the hospital encounter of 09/23/12 (from the past 48 hour(s))  URINALYSIS, ROUTINE W REFLEX MICROSCOPIC      Status: Abnormal   Collection Time   09/23/12 11:30 AM      Component Value Range Comment   Color, Urine YELLOW  YELLOW    APPearance CLEAR  CLEAR    Specific Gravity, Urine 1.014  1.005 - 1.030    pH 7.0  5.0 - 8.0    Glucose, UA NEGATIVE  NEGATIVE mg/dL    Hgb urine dipstick SMALL (*) NEGATIVE    Bilirubin Urine NEGATIVE  NEGATIVE    Ketones, ur 15 (*) NEGATIVE mg/dL    Protein, ur NEGATIVE  NEGATIVE mg/dL    Urobilinogen, UA 1.0  0.0 - 1.0 mg/dL    Nitrite NEGATIVE  NEGATIVE    Leukocytes, UA MODERATE (*) NEGATIVE   URINE MICROSCOPIC-ADD ON     Status: Normal   Collection Time   09/23/12 11:30 AM      Component Value Range Comment   Squamous Epithelial / LPF RARE  RARE    WBC, UA TOO NUMEROUS TO COUNT  <3 WBC/hpf    RBC / HPF 0-2  <3 RBC/hpf    Bacteria, UA RARE  RARE   CBC WITH DIFFERENTIAL     Status: Abnormal   Collection Time   09/23/12  1:23 PM      Component Value Range Comment   WBC 9.0  4.0 - 10.5 K/uL    RBC 3.70 (*) 3.87 -  5.11 MIL/uL    Hemoglobin 11.5 (*) 12.0 - 15.0 g/dL    HCT 03.4 (*) 74.2 - 46.0 %    MCV 92.4  78.0 - 100.0 fL    MCH 31.1  26.0 - 34.0 pg    MCHC 33.6  30.0 - 36.0 g/dL    RDW 59.5  63.8 - 75.6 %    Platelets 185  150 - 400 K/uL    Neutrophils Relative 67  43 - 77 %    Neutro Abs 6.0  1.7 - 7.7 K/uL    Lymphocytes Relative 22  12 - 46 %    Lymphs Abs 2.0  0.7 - 4.0 K/uL    Monocytes Relative 11  3 - 12 %    Monocytes Absolute 1.0  0.1 - 1.0 K/uL    Eosinophils Relative 0  0 - 5 %    Eosinophils Absolute 0.0  0.0 - 0.7 K/uL    Basophils Relative 0  0 - 1 %    Basophils Absolute 0.0  0.0 - 0.1 K/uL   BASIC METABOLIC PANEL     Status: Abnormal   Collection Time   09/23/12  1:23 PM      Component Value Range Comment   Sodium 136  135 - 145 mEq/L    Potassium 3.8  3.5 - 5.1 mEq/L    Chloride 102  96 - 112 mEq/L    CO2 23  19 - 32 mEq/L    Glucose, Bld 104 (*) 70 - 99 mg/dL    BUN 7  6 - 23 mg/dL    Creatinine, Ser 4.33  0.50 - 1.10 mg/dL      Calcium 8.6  8.4 - 10.5 mg/dL    GFR calc non Af Amer >90  >90 mL/min    GFR calc Af Amer >90  >90 mL/min    No results found.  Review of Systems  Constitutional: Positive for fever, chills and malaise/fatigue.  HENT: Negative.   Eyes: Negative.   Respiratory: Negative.   Cardiovascular: Negative.   Gastrointestinal: Positive for nausea and vomiting.  Genitourinary: Positive for dysuria and flank pain.  Musculoskeletal: Negative.   Skin: Negative.   Neurological: Negative.   Endo/Heme/Allergies: Negative.   Psychiatric/Behavioral: Negative.     Blood pressure 127/79, pulse 107, temperature 103 F (39.4 C), temperature source Oral, resp. rate 20, height 5\' 5"  (1.651 m), weight 76.204 kg (168 lb), last menstrual period 08/04/2011, SpO2 95.00%. Physical Exam  Constitutional: She is oriented to person, place, and time. She appears well-developed and well-nourished.       Family at bedside  HENT:  Head: Normocephalic and atraumatic.  Eyes: EOM are normal. Pupils are equal, round, and reactive to light.  Neck: Normal range of motion. Neck supple.  Cardiovascular: Normal rate and regular rhythm.   Respiratory: Effort normal and breath sounds normal.  GI: Soft. Bowel sounds are normal. She exhibits no mass. There is no tenderness. There is no rebound and no guarding.  Genitourinary:       Mod-Severe Rt CVAT  Musculoskeletal: Normal range of motion.  Neurological: She is alert and oriented to person, place, and time.  Skin: Skin is warm and dry.  Psychiatric: She has a normal mood and affect. Her behavior is normal. Judgment and thought content normal.     Assessment/Plan  1 -  Rt Ureteral Stone, Hydronephrosis, Urosepsis - Explained need for urgent renal decompression to help drain likely infected + obstructed pyelonephritis. Discussed role of  ureteral stent v. neph tube and pt wants to proceed with urgent ureteral stent. She understands she will likely require definitive stone  management at a later date as goal today is renal drainage with minimal manipulation. Risks including bleeding, infection, damage to kidney / ureter / bladder, stent failure with need for subsequent nephrostomy discussed. Rare risks including DVT, PE, MI, CVA, Mortality also addressed.  Admit post-op for IV ABX and hydration.  Blain Hunsucker 09/23/2012, 4:50 PM

## 2012-09-24 ENCOUNTER — Encounter (HOSPITAL_COMMUNITY): Payer: Self-pay | Admitting: Urology

## 2012-09-24 LAB — GLUCOSE, CAPILLARY: Glucose-Capillary: 165 mg/dL — ABNORMAL HIGH (ref 70–99)

## 2012-09-24 LAB — BASIC METABOLIC PANEL
Calcium: 9.4 mg/dL (ref 8.4–10.5)
GFR calc Af Amer: >90 mL/min (ref >90)
GFR calc non Af Amer: >90 mL/min (ref >90)
Potassium: 4 mEq/L (ref 3.5–5.1)
Sodium: 135 mEq/L (ref 135–145)

## 2012-09-24 MED ORDER — SENNA-DOCUSATE SODIUM 8.6-50 MG PO TABS: 1.0000 | ORAL_TABLET | Freq: Two times a day (BID) | ORAL | Status: DC

## 2012-09-24 MED ORDER — CIPROFLOXACIN HCL 500 MG PO TABS: 500.0000 mg | ORAL_TABLET | Freq: Two times a day (BID) | ORAL | Status: DC

## 2012-09-24 MED ORDER — OXYBUTYNIN CHLORIDE 5 MG PO TABS: 5.0000 mg | ORAL_TABLET | Freq: Three times a day (TID) | ORAL | Status: DC | PRN

## 2012-09-24 MED ORDER — CIPROFLOXACIN HCL 500 MG PO TABS
500.0000 mg | ORAL_TABLET | Freq: Two times a day (BID) | ORAL | Status: DC
  Administered 2012-09-24: 500 mg via ORAL
  Filled 2012-09-24 (×3): qty 1

## 2012-09-24 MED ORDER — OXYCODONE-ACETAMINOPHEN 5-325 MG PO TABS: 1.0000 | ORAL_TABLET | ORAL | Status: AC | PRN

## 2012-09-24 NOTE — Op Note (Signed)
NAMEGLENN, CHRISTO NO.:  [id number]  MEDICAL RECORD NO.:  [id number]  LOCATION:  1419                         FACILITY:  [facility]  PHYSICIAN:  Sebastian Ache, MD     DATE OF BIRTH:  1961-05-28  DATE OF PROCEDURE:  09/23/2012 DATE OF DISCHARGE:                              OPERATIVE REPORT   DIAGNOSES:  Right ureteral stone, fevers, urosepsis.  PROCEDURES: 1. Cystoscopy with right retrograde pyelogram and interpretation. 2. Placement of right ureteral stent, 6 x 24, no tether.  COMPLICATIONS:  None.  SPECIMENS:  None.  FINDINGS: 1. Right very mild hydronephrosis without ureteral nephrosis. 2. No obvious filling defect. 3. Right retrograde pyelogram. 4. Efflux of very purulent-appearing urine from the right kidney     following stent placement.  ESTIMATED BLOOD LOSS:  Nil.  INDICATION:  [name] is a pleasant 52 year old lady with history of prior nephrolithiasis, who was seen in the [facility] ER on September 21, 2012, with proximal right ureteral stone.  At that time, her pain was controlled and she had no fevers.  Therefore, she was given a trial of medical expulsive therapy.  She re-presented today with no passage of stone; however, a new development of significant fevers up to 103 with tachycardia.  Notably, her previous urine had grown E. coli.  This constellation of symptoms was worrisome for obstructed stone plus pyelonephritis and urosepsis.  The patient was urgently transferred to [facility] where she was evaluated and felt that urgent renal decompression was warranted with ureteral stenting.  Informed consent was obtained and placed in the medical record.  PROCEDURE IN DETAIL:  The patient being Lisa Casey, was verified. Procedure being cysto and right ureteral stent placement was confirmed. The procedure was carried out.  Time-out was performed.  Intravenous antibiotics were administered.  General LMA anesthesia was introduced. The patient  was placed into a low lithotomy position.  Sterile field was created by prepping and draping the patient's vagina, introitus and proximal thighs using iodine x3.  Next, cystourethroscopy was performed using a 22-French rigid cystoscope with 12-degree offset lens. Inspection of the urinary bladder revealed no diverticula, calcifications, papillary lesions.  Ureteral orifices were in the normal anatomic position.  Attention was then directed to the right retrograde pyelogram.  The right ureteral orifice was gently cannulated using a 6- French end-hole catheter and right retrograde pyelogram was obtained.  Right retrograde pyelogram demonstrated a single right ureter, single system right kidney.  There was no obvious filling defect noted.  There was very mild hydronephrosis with a ureteronephrosis.  A 0.038 Sensor wire was advanced at the level of the upper pole.  There was a new 6 x 24 double-J stent was placed.  Good proximal and distal curl were noted. There was efflux of very purulent-appearing fluid around and through the distal end of the stent following placement.  The bladder was emptied per cystoscope.  Procedure was then terminated.  The patient tolerated the procedure well.  There were no immediate periprocedural complications.  The patient was taken to the postanesthesia care unit in stable condition.          ______________________________ Sebastian Ache, MD  TM/MEDQ  D:  09/23/2012  T:  09/24/2012  Job:  [id number]

## 2012-09-24 NOTE — Discharge Summary (Signed)
Physician Discharge Summary  Patient ID: Lisa Casey MRN: [id number] DOB/AGE: [birth year] 53 y.o.  Admit date: 09/23/2012 Discharge date: 09/24/2012  Admission Diagnoses: Rt ureteral Stone, hydronephrosis, urosepsis  Discharge Diagnoses: Rt ureteral Stone, hydronephrosis, urosepsis   Discharged Condition: good  Hospital Course:   Pt admitted through ER 1/7 for urgent surgery with ureteral stenting to decompress obstructed kidney from ureteral stone in setting of pyelonephritis. She was admitted for observation post-op. Following surgery she was kept on IV antibiotics and transitioned to oral Cipro based on her most recent cultures from several days ago. By the afternoon of 1/8 she remained afebrile for >20 hours, was tolerating diet, pain controlled and felt to be adequate for discharge.   Consults: None  Significant Diagnostic Studies: labs: UCX - e. coli  Treatments: surgery: Rt ureteral stent 09/23/12  Discharge Exam: Blood pressure 113/69, pulse 67, temperature 98.6 F (37 C), temperature source Oral, resp. rate 17, height 5\' 5"  (1.651 m), weight 78.2 kg (172 lb 6.4 oz), last menstrual period 08/04/2011, SpO2 96.00%. General appearance: alert, cooperative, appears stated age and much more vigorous now that afebrile Head: Normocephalic, without obvious abnormality, atraumatic Eyes: conjunctivae/corneas clear. PERRL, EOM's intact. Fundi benign. Ears: normal TM's and external ear canals both ears Nose: Nares normal. Septum midline. Mucosa normal. No drainage or sinus tenderness. Throat: lips, mucosa, and tongue normal; teeth and gums normal Neck: no adenopathy, no carotid bruit, no JVD, supple, symmetrical, trachea midline and thyroid not enlarged, symmetric, no tenderness/mass/nodules Back: symmetric, no curvature. ROM normal. No CVA tenderness., Minimal CVAT Resp: clear to auscultation bilaterally Cardio: regular rate and rhythm, S1, S2 normal, no murmur, click, rub or  gallop GI: soft, non-tender; bowel sounds normal; no masses,  no organomegaly Extremities: extremities normal, atraumatic, no cyanosis or edema Pulses: 2+ and symmetric Skin: Skin color, texture, turgor normal. No rashes or lesions Lymph nodes: Cervical, supraclavicular, and axillary nodes normal. Neurologic: Grossly normal  Disposition: 01-Home or Self Care     Medication List     As of 09/24/2012  4:01 PM    STOP taking these medications         Tamsulosin HCl 0.4 MG Caps   Commonly known as: FLOMAX      TAKE these medications         ciprofloxacin 500 MG tablet   Commonly known as: CIPRO   Take 1 tablet (500 mg total) by mouth 2 (two) times daily. X 7 days now. Also begin day prior to next kidney stone surgery.      DULoxetine 60 MG capsule   Commonly known as: CYMBALTA   Take 60 mg by mouth daily.      ibuprofen 200 MG tablet   Commonly known as: ADVIL,MOTRIN   Take 800 mg by mouth every 6 (six) hours as needed. For pain      lisinopril-hydrochlorothiazide 10-12.5 MG per tablet   Commonly known as: PRINZIDE,ZESTORETIC   Take 1 tablet by mouth daily.      nitroGLYCERIN 0.4 MG SL tablet   Commonly known as: NITROSTAT   Place 0.4 mg under the tongue every 5 (five) minutes as needed. Chest pain      oxybutynin 5 MG tablet   Commonly known as: DITROPAN   Take 1 tablet (5 mg total) by mouth every 8 (eight) hours as needed. For severe bladder spasms      oxyCODONE-acetaminophen 5-325 MG per tablet   Commonly known as: PERCOCET/ROXICET   Take 1 tablet by mouth  every 4 (four) hours as needed for pain.      sennosides-docusate sodium 8.6-50 MG tablet   Commonly known as: SENOKOT-S   Take 1 tablet by mouth 2 (two) times daily.         SignedSebastian Ache 09/24/2012, 4:01 PM

## 2012-09-24 NOTE — Care Management Note (Unsigned)
    Page 1 of 1   09/24/2012     11:57:34 AM   CARE MANAGEMENT NOTE 09/24/2012  Patient:  Lisa Casey, Lisa Casey   Account Number:  [id number]  Date Initiated:  09/24/2012  Documentation initiated by:  Lanier Clam  Subjective/Objective Assessment:   ADMITTED W/R URETERAL STONE.     Action/Plan:   FROM HOME   Anticipated DC Date:  09/24/2012   Anticipated DC Plan:  HOME/SELF CARE         Choice offered to / List presented to:             Status of service:  In process, will continue to follow Medicare Important Message given?   (If response is "NO", the following Medicare IM given date fields will be blank) Date Medicare IM given:   Date Additional Medicare IM given:    Discharge Disposition:    Per UR Regulation:  Reviewed for med. necessity/level of care/duration of stay  If discussed at Long Length of Stay Meetings, dates discussed:    Comments:  09/24/12 [name] RN,BSN NCM [phone] S/P R URETERAL STENT.

## 2012-09-25 ENCOUNTER — Other Ambulatory Visit: Payer: Self-pay | Admitting: Urology

## 2012-09-25 LAB — URINE CULTURE: Colony Count: 80000

## 2012-09-25 NOTE — ED Provider Notes (Signed)
Medical screening examination/treatment/procedure(s) were conducted as a shared visit with non-physician practitioner(s) and myself.  I personally evaluated the patient during the encounter   Joya Gaskins, MD 09/25/12 [phone]

## 2012-10-07 ENCOUNTER — Encounter (HOSPITAL_BASED_OUTPATIENT_CLINIC_OR_DEPARTMENT_OTHER): Payer: Self-pay | Admitting: *Deleted

## 2012-10-07 NOTE — Progress Notes (Signed)
To  [facility] at 1215-Istat on arrival-Ekg in Epic-Npo after Mn-clear liquids only till 0600,then Npo-states understands-may take cymbalta,ditropan.

## 2012-10-08 ENCOUNTER — Encounter (HOSPITAL_BASED_OUTPATIENT_CLINIC_OR_DEPARTMENT_OTHER): Payer: Self-pay | Admitting: Anesthesiology

## 2012-10-08 ENCOUNTER — Encounter (HOSPITAL_BASED_OUTPATIENT_CLINIC_OR_DEPARTMENT_OTHER): Payer: Self-pay

## 2012-10-08 ENCOUNTER — Ambulatory Visit (HOSPITAL_BASED_OUTPATIENT_CLINIC_OR_DEPARTMENT_OTHER)
Admission: RE | Admit: 2012-10-08 | Discharge: 2012-10-08 | Disposition: A | Discharge status: Home / Self Care | Payer: BC Managed Care – PPO | Source: Ambulatory Visit | Attending: Urology | Admitting: Urology

## 2012-10-08 ENCOUNTER — Encounter (HOSPITAL_BASED_OUTPATIENT_CLINIC_OR_DEPARTMENT_OTHER)
Admission: RE | Disposition: A | Discharge status: Home / Self Care | Payer: Self-pay | Source: Ambulatory Visit | Attending: Urology

## 2012-10-08 ENCOUNTER — Ambulatory Visit (HOSPITAL_BASED_OUTPATIENT_CLINIC_OR_DEPARTMENT_OTHER): Payer: BC Managed Care – PPO | Admitting: Anesthesiology

## 2012-10-08 DIAGNOSIS — IMO0001 Reserved for inherently not codable concepts without codable children: Secondary | ICD-10-CM | POA: Insufficient documentation

## 2012-10-08 DIAGNOSIS — I10 Essential (primary) hypertension: Secondary | ICD-10-CM | POA: Insufficient documentation

## 2012-10-08 DIAGNOSIS — N201 Calculus of ureter: Secondary | ICD-10-CM | POA: Insufficient documentation

## 2012-10-08 DIAGNOSIS — K219 Gastro-esophageal reflux disease without esophagitis: Secondary | ICD-10-CM | POA: Insufficient documentation

## 2012-10-08 DIAGNOSIS — N200 Calculus of kidney: Secondary | ICD-10-CM | POA: Insufficient documentation

## 2012-10-08 DIAGNOSIS — Z79899 Other long term (current) drug therapy: Secondary | ICD-10-CM | POA: Insufficient documentation

## 2012-10-08 HISTORY — PX: HOLMIUM LASER APPLICATION: SHX5852

## 2012-10-08 HISTORY — PX: CYSTOSCOPY WITH RETROGRADE PYELOGRAM, URETEROSCOPY AND STENT PLACEMENT: SHX5789

## 2012-10-08 LAB — POCT I-STAT 4, (NA,K, GLUC, HGB,HCT)
Glucose, Bld: 87 mg/dL (ref 70–99)
HCT: 40 % (ref 36.0–46.0)
Hemoglobin: 13.6 g/dL (ref 12.0–15.0)

## 2012-10-08 SURGERY — CYSTOURETEROSCOPY, WITH RETROGRADE PYELOGRAM AND STENT INSERTION
Anesthesia: General | Site: Ureter | Laterality: Right | Wound class: Clean Contaminated

## 2012-10-08 MED ORDER — DEXAMETHASONE SODIUM PHOSPHATE 4 MG/ML IJ SOLN
INTRAMUSCULAR | Status: DC | PRN
  Administered 2012-10-08: 10 mg via INTRAVENOUS

## 2012-10-08 MED ORDER — OXYCODONE HCL 5 MG PO CAPS: 5.0000 mg | ORAL_CAPSULE | ORAL | Status: DC | PRN

## 2012-10-08 MED ORDER — ONDANSETRON HCL 4 MG/2ML IJ SOLN
INTRAMUSCULAR | Status: DC | PRN
  Administered 2012-10-08: 4 mg via INTRAVENOUS

## 2012-10-08 MED ORDER — LACTATED RINGERS IV SOLN
INTRAVENOUS | Status: DC | PRN
  Administered 2012-10-08 (×2): via INTRAVENOUS

## 2012-10-08 MED ORDER — CIPROFLOXACIN IN D5W 400 MG/200ML IV SOLN
400.0000 mg | INTRAVENOUS | Status: AC
  Administered 2012-10-08: 400 mg via INTRAVENOUS
  Filled 2012-10-08: qty 200

## 2012-10-08 MED ORDER — LACTATED RINGERS IV SOLN
INTRAVENOUS | Status: DC
  Administered 2012-10-08: 12:00:00 via INTRAVENOUS
  Administered 2012-10-08: 100 mL/h via INTRAVENOUS
  Filled 2012-10-08: qty 1000

## 2012-10-08 MED ORDER — FENTANYL CITRATE 0.05 MG/ML IJ SOLN
INTRAMUSCULAR | Status: DC | PRN
  Administered 2012-10-08 (×5): 25 ug via INTRAVENOUS
  Administered 2012-10-08: 50 ug via INTRAVENOUS
  Administered 2012-10-08: 25 ug via INTRAVENOUS

## 2012-10-08 MED ORDER — MIDAZOLAM HCL 5 MG/5ML IJ SOLN
INTRAMUSCULAR | Status: DC | PRN
  Administered 2012-10-08: 2 mg via INTRAVENOUS
  Administered 2012-10-08 (×2): 1 mg via INTRAVENOUS

## 2012-10-08 MED ORDER — PROMETHAZINE HCL 25 MG/ML IJ SOLN
6.2500 mg | INTRAMUSCULAR | Status: DC | PRN
  Filled 2012-10-08: qty 1

## 2012-10-08 MED ORDER — KETOROLAC TROMETHAMINE 30 MG/ML IJ SOLN
INTRAMUSCULAR | Status: DC | PRN
  Administered 2012-10-08: 30 mg via INTRAVENOUS

## 2012-10-08 MED ORDER — FENTANYL CITRATE 0.05 MG/ML IJ SOLN
25.0000 ug | INTRAMUSCULAR | Status: DC | PRN
  Administered 2012-10-08 (×2): 25 ug via INTRAVENOUS
  Filled 2012-10-08: qty 1

## 2012-10-08 MED ORDER — OXYCODONE HCL 5 MG PO TABS
5.0000 mg | ORAL_TABLET | Freq: Once | ORAL | Status: AC
  Administered 2012-10-08: 5 mg via ORAL
  Filled 2012-10-08: qty 1

## 2012-10-08 MED ORDER — LIDOCAINE HCL (CARDIAC) 20 MG/ML IV SOLN
INTRAVENOUS | Status: DC | PRN
  Administered 2012-10-08: 100 mg via INTRAVENOUS

## 2012-10-08 MED ORDER — PROPOFOL 10 MG/ML IV BOLUS
INTRAVENOUS | Status: DC | PRN
  Administered 2012-10-08: 200 mg via INTRAVENOUS

## 2012-10-08 MED ORDER — SODIUM CHLORIDE 0.9 % IR SOLN
Status: DC | PRN
  Administered 2012-10-08: 6000 mL

## 2012-10-08 MED ORDER — IOHEXOL 350 MG/ML SOLN
INTRAVENOUS | Status: DC | PRN
  Administered 2012-10-08: 12 mL via INTRAVENOUS

## 2012-10-08 SURGICAL SUPPLY — 45 items
ADAPTER CATH URET PLST 4-6FR (CATHETERS) ×1 IMPLANT
ADPR CATH URET STRL DISP 4-6FR (CATHETERS) ×2
BAG DRAIN URO-CYSTO SKYTR STRL (DRAIN) ×3 IMPLANT
BAG DRN UROCATH (DRAIN) ×2
BAG URO CATCHER STRL LF (DRAPE) ×3 IMPLANT
BASKET LASER NITINOL 1.9FR (BASKET) ×3 IMPLANT
BASKET STNLS GEMINI 4WIRE 3FR (BASKET) IMPLANT
BASKET ZERO TIP NITINOL 2.4FR (BASKET) IMPLANT
BRUSH URET BIOPSY 3F (UROLOGICAL SUPPLIES) IMPLANT
BSKT STON RTRVL 120 1.9FR (BASKET) ×2
BSKT STON RTRVL GEM 120X11 3FR (BASKET)
BSKT STON RTRVL ZERO TP 2.4FR (BASKET)
CANISTER SUCT LVC 12 LTR MEDI- (MISCELLANEOUS) ×1 IMPLANT
CATH FOLEY 2WAY  3CC  8FR (CATHETERS)
CATH FOLEY 2WAY 3CC 8FR (CATHETERS) IMPLANT
CATH INTERMIT  6FR 70CM (CATHETERS) ×1 IMPLANT
CATH URET 5FR 28IN CONE TIP (BALLOONS)
CATH URET 5FR 28IN OPEN ENDED (CATHETERS) IMPLANT
CATH URET 5FR 70CM CONE TIP (BALLOONS) IMPLANT
CLOTH BEACON ORANGE TIMEOUT ST (SAFETY) ×3 IMPLANT
DRAPE CAMERA CLOSED 9X96 (DRAPES) ×3 IMPLANT
ELECT REM PT RETURN 9FT ADLT (ELECTROSURGICAL)
ELECTRODE REM PT RTRN 9FT ADLT (ELECTROSURGICAL) IMPLANT
GLOVE BIO SURGEON STRL SZ7 (GLOVE) ×3 IMPLANT
GLOVE BIO SURGEON STRL SZ7.5 (GLOVE) ×3 IMPLANT
GOWN PREVENTION PLUS LG XLONG (DISPOSABLE) ×3 IMPLANT
GOWN PREVENTION PLUS XLARGE (GOWN DISPOSABLE) ×3 IMPLANT
GOWN STRL NON-REIN LRG LVL3 (GOWN DISPOSABLE) ×6 IMPLANT
GOWN STRL REIN XL XLG (GOWN DISPOSABLE) ×3 IMPLANT
GUIDEWIRE 0.038 PTFE COATED (WIRE) IMPLANT
GUIDEWIRE ANG ZIPWIRE 038X150 (WIRE) ×3 IMPLANT
GUIDEWIRE STR DUAL SENSOR (WIRE) ×3 IMPLANT
IV NS IRRIG 3000ML ARTHROMATIC (IV SOLUTION) ×6 IMPLANT
KIT BALLIN UROMAX 15FX10 (LABEL) IMPLANT
KIT BALLN UROMAX 15FX4 (MISCELLANEOUS) IMPLANT
KIT BALLN UROMAX 26 75X4 (MISCELLANEOUS)
PACK CYSTOSCOPY (CUSTOM PROCEDURE TRAY) ×3 IMPLANT
SET HIGH PRES BAL DIL (LABEL)
SHEATH ACCESS URETERAL 24CM (SHEATH) ×1 IMPLANT
SHEATH URET ACCESS 12FR/35CM (UROLOGICAL SUPPLIES) IMPLANT
SHEATH URET ACCESS 12FR/55CM (UROLOGICAL SUPPLIES) IMPLANT
STENT URET 6FRX24 CONTOUR (STENTS) ×1 IMPLANT
SYRINGE 10CC LL (SYRINGE) ×3 IMPLANT
SYRINGE IRR TOOMEY STRL 70CC (SYRINGE) IMPLANT
TUBE FEEDING 8FR 16IN STR KANG (MISCELLANEOUS) ×1 IMPLANT

## 2012-10-08 NOTE — Anesthesia Postprocedure Evaluation (Signed)
  Anesthesia Post-op Note  Patient: Lisa Casey  Procedure(s) Performed: Procedure(s) (LRB): CYSTOSCOPY WITH RETROGRADE PYELOGRAM, URETEROSCOPY AND STENT PLACEMENT (Right) HOLMIUM LASER APPLICATION (Right)  Patient Location: PACU  Anesthesia Type: General  Level of Consciousness: awake and alert   Airway and Oxygen Therapy: Patient Spontanous Breathing  Post-op Pain: mild  Post-op Assessment: Post-op Vital signs reviewed, Patient's Cardiovascular Status Stable, Respiratory Function Stable, Patent Airway and No signs of Nausea or vomiting  Last Vitals:  Filed Vitals:   10/08/12 1430  BP: 141/81  Pulse: 73  Temp:   Resp: 14    Post-op Vital Signs: stable   Complications: No apparent anesthesia complications

## 2012-10-08 NOTE — Brief Op Note (Signed)
10/08/2012  2:10 PM  PATIENT:  Lisa Casey  52 y.o. female  PRE-OPERATIVE DIAGNOSIS:  RIGHT URETERAL STONE  POST-OPERATIVE DIAGNOSIS:  RIGHT URETERAL STONE  PROCEDURE:  Rt ureteroscopy + basket stone, retrograde, stent exchange  SURGEON:  Surgeon(s) and Role:    * Sebastian Ache, MD - Primary  PHYSICIAN ASSISTANT:   ASSISTANTS: none   ANESTHESIA:   general  EBL:  Total I/O In: 1000 [I.V.:1000] Out: -   BLOOD ADMINISTERED:none  DRAINS: none   LOCAL MEDICATIONS USED:  NONE  SPECIMEN:  Source of Specimen:  Rt kidney - stone  DISPOSITION OF SPECIMEN:  [facility] for Compositional Analysis  COUNTS:  YES  TOURNIQUET:  * No tourniquets in log *  DICTATION: .Other Dictation: Dictation Number [id number]  PLAN OF CARE: Discharge to home after PACU  PATIENT DISPOSITION:  PACU - hemodynamically stable.   Delay start of Pharmacological VTE agent (>24hrs) due to surgical blood loss or risk of bleeding: no

## 2012-10-08 NOTE — H&P (Signed)
Lisa Casey is an 52 y.o. female.   Chief Complaint: Pre-Op Rt Ureteroscopic Stone Manipulation HPI:   1 -  Rt Ureteral Stone - Pt s/p urgent rt ureteral stent 09/23/2012 for rt ureteral stone and urosepsis. Has h/o  1 prior stone episode. No interval stone passage. UCX was pan-sensitive e. Coli and she has been on CX-specific antibiotics.  Today Lisa Casey is seen to proceed with definitive management of her rt ureteral stone. She has had some interval stent colic that has been controlled with PO meds.  Past Medical History  Diagnosis Date  . Fibromyalgia     PT ON LYRIC   . Hypertension     HX OF ELEVATED B/P FOR SEVERAL YRS--NO MEDS UNTIL LAST WEEK--PT WAS C/O OF CHEST PRESSURE AND B/P ELEVATED--PT SAW NURSE PRACTIONER ON 08/21/11 AND STATES EKG WAS DONE--TOLD ABNORMAL--PUT ON B/P MEDICATION LISINOPRIL AND TOLD SHE WAS TO SEE A CARDIOLOGIST.  [name] Shortness of breath     WITH EXERTION--NEW FOR PT--PT ALSO REPORTS FEELING TIRED  . GERD (gastroesophageal reflux disease)     NOT ON ANY MEDS AT PRESENT  . Headache     HX OF MIGRAINES  . Anxiety     HX OF ANXIETY ATTACKS  . Back pain, chronic     Past Surgical History  Procedure Date  . Tubal ligation   . Constipation   . Robotic assisted lap vaginal hysterectomy 09/14/2011    Procedure: ROBOTIC ASSISTED LAPAROSCOPIC VAGINAL HYSTERECTOMY;  Surgeon: Roseanna Rainbow, MD;  Location: [facility];  Service: Gynecology;  Laterality: N/A;  . Cystoscopy w/ ureteral stent placement 09/23/2012    Procedure: CYSTOSCOPY WITH RETROGRADE PYELOGRAM/URETERAL STENT PLACEMENT;  Surgeon: Sebastian Ache, MD;  Location: [facility];  Service: Urology;  Laterality: Right;    History reviewed. No pertinent family history. Social History:  reports that she has never smoked. She has never used smokeless tobacco. She reports that she does not drink alcohol or use illicit drugs.  Allergies: No Known Allergies  No prescriptions prior to admission    No results  found for this or any previous visit (from the past 48 hour(s)). No results found.  Review of Systems  Constitutional: Negative.  Negative for fever and chills.  HENT: Negative.   Eyes: Negative.   Respiratory: Negative.   Cardiovascular: Negative.   Gastrointestinal: Negative.   Genitourinary: Positive for flank pain.  Musculoskeletal: Negative.   Skin: Negative.   Neurological: Negative.   Endo/Heme/Allergies: Negative.   Psychiatric/Behavioral: Negative.     Height 5\' 5"  (1.651 m), weight 78.019 kg (172 lb), last menstrual period 08/04/2011. Physical Exam  Constitutional: She is oriented to person, place, and time. She appears well-developed and well-nourished.  HENT:  Head: Normocephalic and atraumatic.  Eyes: EOM are normal. Pupils are equal, round, and reactive to light.  Neck: Normal range of motion. Neck supple.  Cardiovascular: Normal rate.   Respiratory: Effort normal.  GI: Soft. Bowel sounds are normal.  Genitourinary:       Mild Rt CVAT  Musculoskeletal: Normal range of motion.  Neurological: She is alert and oriented to person, place, and time.  Skin: Skin is warm and dry.  Psychiatric: She has a normal mood and affect. Her behavior is normal. Judgment and thought content normal.     Assessment/Plan 1 -  Rt Ureteral Stone - We re-discussed ureteroscopic stone manipulation with basketing and laser-lithotripsy in detail.  We discussed risks including bleeding, infection, damage to kidney / ureter  bladder, rarely  loss of kidney. We discussed anesthetic risks and rare but serious surgical complications including DVT, PE, MI, and mortality. We specifically addressed that in 5-10% of cases a staged approach is required with stenting followed by re-attempt ureteroscopy if anatomy unfavorable. The patient voiced understanding and wises to proceed.   Lisa Casey 10/08/2012, 7:32 AM

## 2012-10-08 NOTE — Transfer of Care (Signed)
Immediate Anesthesia Transfer of Care Note  Patient: Lisa Casey  Procedure(s) Performed: Procedure(s) (LRB): CYSTOSCOPY WITH RETROGRADE PYELOGRAM, URETEROSCOPY AND STENT PLACEMENT (Right) HOLMIUM LASER APPLICATION (Right)  Patient Location: PACU  Anesthesia Type: General  Level of Consciousness: awake, sedated, patient cooperative and responds to stimulation  Airway & Oxygen Therapy: Patient Spontanous Breathing and Patient connected to face mask oxygen  Post-op Assessment: Report given to PACU RN, Post -op Vital signs reviewed and stable and Patient moving all extremities  Post vital signs: Reviewed and stable  Complications: No apparent anesthesia complications

## 2012-10-08 NOTE — Anesthesia Procedure Notes (Signed)
Procedure Name: LMA Insertion Date/Time: 10/08/2012 1:38 PM Performed by: Jessica Priest Pre-anesthesia Checklist: Patient identified, Emergency Drugs available, Suction available and Patient being monitored Patient Re-evaluated:Patient Re-evaluated prior to inductionOxygen Delivery Method: Circle System Utilized Preoxygenation: Pre-oxygenation with 100% oxygen Intubation Type: IV induction Ventilation: Mask ventilation without difficulty LMA: LMA inserted LMA Size: 4.0 Number of attempts: 1 Airway Equipment and Method: bite block Placement Confirmation: positive ETCO2 Tube secured with: Tape Dental Injury: Teeth and Oropharynx as per pre-operative assessment

## 2012-10-08 NOTE — Anesthesia Preprocedure Evaluation (Addendum)
Anesthesia Evaluation  Patient identified by MRN, date of birth, ID band Patient awake    Reviewed: Allergy & Precautions, H&P , NPO status , Patient's Chart, lab work & pertinent test results, reviewed documented beta blocker date and time   Airway Mallampati: II TM Distance: >3 FB Neck ROM: full    Dental No notable dental hx.    Pulmonary shortness of breath,  breath sounds clear to auscultation  Pulmonary exam normal       Cardiovascular Exercise Tolerance: Good hypertension, On Medications negative cardio ROS  Rhythm:regular Rate:Normal  She states she had a cardiac catheterization in 2013 which was OK.   Neuro/Psych  Headaches, Anxiety.Back Pain.  Neuromuscular disease    GI/Hepatic negative GI ROS, Neg liver ROS, GERD-  ,  Endo/Other  Borderline Diabetes on last Doctor visit.  Renal/GU negative Renal ROS  negative genitourinary   Musculoskeletal  (+) Fibromyalgia -, narcotic dependent  Abdominal   Peds  Hematology negative hematology ROS (+)   Anesthesia Other Findings   Reproductive/Obstetrics negative OB ROS                         Anesthesia Physical Anesthesia Plan  ASA: II  Anesthesia Plan: General LMA   Post-op Pain Management:    Induction:   Airway Management Planned: LMA  Additional Equipment:   Intra-op Plan:   Post-operative Plan:   Informed Consent: I have reviewed the patients History and Physical, chart, labs and discussed the procedure including the risks, benefits and alternatives for the proposed anesthesia with the patient or authorized representative who has indicated his/her understanding and acceptance.   Dental Advisory Given  Plan Discussed with: CRNA  Anesthesia Plan Comments:         Anesthesia Quick Evaluation

## 2012-10-09 NOTE — Op Note (Signed)
[name], [name] NO.:  [id number]  MEDICAL RECORD NO.:  [id number]  LOCATION:                               FACILITY:  [facility]  PHYSICIAN:  Sebastian Ache, MD     DATE OF BIRTH:  02-Apr-1961  DATE OF PROCEDURE:  10/08/2012 DATE OF DISCHARGE:  10/08/2012                              OPERATIVE REPORT   PREOPERATIVE DIAGNOSIS:  Right renal and ureteral stones, history of sepsis secondary to urinary tract infection.  PROCEDURE: 1. Cystoscopy with right retrograde pyelogram interpretation. 2. Right ureteral stent exchanged. 3. Right ureteroscopy with basketing of ureteral stone.  COMPLICATIONS:  None.  SPECIMEN:  Right renal stones for composition analysis.  FINDINGS: 1. Two separate calculi in the lower pole of the right kidney.  These     were removed in their entirety. 2. Unremarkable right retrograde pyelogram.  ESTIMATED BLOOD LOSS:  Nil.  COMPLICATIONS:  None.  INDICATION:  [name] is a pleasant 52 year old lady with history of right ureteral stone and sepsis secondary to UTI on September 23, 2012. She underwent temporizing with ureteral stenting and intravenous antibiotics was found to have pan sensitive E. coli.  She has been on appropriate antibiotic therapy, and now presents for definitive stone management of her right stone with ureteroscopy.  Informed consent was obtained and placed in medical record.  PROCEDURE IN DETAIL:  The patient being Lisa Casey, was verified. The procedure being right ureteroscopic stone manipulation was confirmed.  Procedure was carried out.  Time-out was performed. Intravenous antibiotics administered.  General LMA anesthesia was introduced.  The patient was placed into a low lithotomy position. Sterile field was created by prepping and draping the patient's vagina, introitus, and proximal thighs using iodine x3.  Next, cystourethroscopy was performed using a 22-French cystoscope with a 12-degree offset  lens. Inspection of the urinary bladder revealed no diverticula, calcifications, papular lesions.  Distal end of right ureteral stent was seen in situ.  This was grasped and brought to the level of the urethral meatus through which a 0.038 Glidewire was advanced at the level of the upper pole.  The stent was exchanged for a 6-French end-hole catheter. Right retro pyelogram was obtained.  Right retrograde pyelogram demonstrated a single right ureter single system right kidney.  There were no filling defects or narrowing noted. The Glidewire was once again advanced to the upper pole and set aside as a safety wire.  Next, the cystoscope was exchanged for the 6.4-French semi-rigid ureteroscope using normal saline under pressure alongside a separate Sensor working wire.  Semi-rigid ureteroscopy was performed in the entire length of the right ureter.  No mucosal abnormalities or calcifications were noted.  Notably an 8-French feeding tube was placed in the urinary bladder for pressure release.  The ureteroscope was then exchanged for a 24 cm 12/14 ureteral access sheath was carefully placed over the sensor working wire using cystoscopic and fluoroscopic guidance.  Next, flexible ureteroscopy was performed using 8-French digital ureteroscope of the proximal ureter and systematic inspection of the entire right kidney.  This revealed 2 calcifications in the extreme lower pole.  These were grasped with an escape-type basket and brought out in their  entirety and set aside for compositional analysis.  Repeat inspection of each calyx revealed no additional stone fragments.  No evidence of perforation.  Excellent hemostasis.  The sheath was then removed under continuous ureteroscopy and no mucosal abnormalities were seen.  Finally, a new 6 x 24 double-J stent was placed over the remaining safety wire using fluoroscopic guidance.  Good proximal and distal curl were noted.  Bladder was emptied per  cystoscope.  Procedure was then terminated.  The patient tolerated the procedure well.  There were no immediate periprocedural complications.  The patient was taken to the postanesthesia care unit in stable condition.          ______________________________ Sebastian Ache, MD     TM/MEDQ  D:  10/08/2012  T:  10/09/2012  Job:  [id number]

## 2012-10-10 ENCOUNTER — Encounter (HOSPITAL_BASED_OUTPATIENT_CLINIC_OR_DEPARTMENT_OTHER): Payer: Self-pay | Admitting: Urology

## 2012-11-18 ENCOUNTER — Emergency Department (HOSPITAL_COMMUNITY)
Admission: EM | Admit: 2012-11-18 | Discharge: 2012-11-18 | Disposition: A | Discharge status: Home / Self Care | Payer: BC Managed Care – PPO | Attending: Emergency Medicine | Admitting: Emergency Medicine

## 2012-11-18 ENCOUNTER — Emergency Department (HOSPITAL_COMMUNITY): Payer: BC Managed Care – PPO

## 2012-11-18 ENCOUNTER — Encounter (HOSPITAL_COMMUNITY): Payer: Self-pay

## 2012-11-18 DIAGNOSIS — Z8719 Personal history of other diseases of the digestive system: Secondary | ICD-10-CM | POA: Insufficient documentation

## 2012-11-18 DIAGNOSIS — F411 Generalized anxiety disorder: Secondary | ICD-10-CM | POA: Insufficient documentation

## 2012-11-18 DIAGNOSIS — Z8679 Personal history of other diseases of the circulatory system: Secondary | ICD-10-CM | POA: Insufficient documentation

## 2012-11-18 DIAGNOSIS — R509 Fever, unspecified: Secondary | ICD-10-CM

## 2012-11-18 DIAGNOSIS — Z79899 Other long term (current) drug therapy: Secondary | ICD-10-CM | POA: Insufficient documentation

## 2012-11-18 DIAGNOSIS — I10 Essential (primary) hypertension: Secondary | ICD-10-CM | POA: Insufficient documentation

## 2012-11-18 DIAGNOSIS — IMO0001 Reserved for inherently not codable concepts without codable children: Secondary | ICD-10-CM | POA: Insufficient documentation

## 2012-11-18 DIAGNOSIS — H538 Other visual disturbances: Secondary | ICD-10-CM | POA: Insufficient documentation

## 2012-11-18 DIAGNOSIS — G8929 Other chronic pain: Secondary | ICD-10-CM | POA: Insufficient documentation

## 2012-11-18 DIAGNOSIS — R079 Chest pain, unspecified: Secondary | ICD-10-CM | POA: Insufficient documentation

## 2012-11-18 LAB — URINALYSIS, ROUTINE W REFLEX MICROSCOPIC
Bilirubin Urine: NEGATIVE
Glucose, UA: NEGATIVE mg/dL
Hgb urine dipstick: NEGATIVE
Nitrite: NEGATIVE
Specific Gravity, Urine: 1.029 (ref 1.005–1.030)
pH: 7 (ref 5.0–8.0)

## 2012-11-18 LAB — BASIC METABOLIC PANEL
BUN: 18 mg/dL (ref 6–23)
CO2: 25 mEq/L (ref 19–32)
Chloride: 100 mEq/L (ref 96–112)
Creatinine, Ser: 0.75 mg/dL (ref 0.50–1.10)
Glucose, Bld: 98 mg/dL (ref 70–99)
Potassium: 3.5 mEq/L (ref 3.5–5.1)

## 2012-11-18 LAB — CBC WITH DIFFERENTIAL/PLATELET
HCT: 41.4 % (ref 36.0–46.0)
Hemoglobin: 14.4 g/dL (ref 12.0–15.0)
Lymphs Abs: 0.8 10*3/uL (ref 0.7–4.0)
Monocytes Absolute: 0.4 10*3/uL (ref 0.1–1.0)
Monocytes Relative: 8 % (ref 3–12)
Neutro Abs: 3.3 10*3/uL (ref 1.7–7.7)
Neutrophils Relative %: 75 % (ref 43–77)
RBC: 4.59 MIL/uL (ref 3.87–5.11)

## 2012-11-18 LAB — URINE MICROSCOPIC-ADD ON

## 2012-11-18 MED ORDER — OXYCODONE-ACETAMINOPHEN 5-325 MG PO TABS
2.0000 | ORAL_TABLET | Freq: Once | ORAL | Status: AC
  Administered 2012-11-18: 2 via ORAL
  Filled 2012-11-18: qty 2

## 2012-11-18 MED ORDER — ACETAMINOPHEN 325 MG PO TABS
650.0000 mg | ORAL_TABLET | Freq: Once | ORAL | Status: AC
  Administered 2012-11-18: 650 mg via ORAL
  Filled 2012-11-18: qty 2

## 2012-11-18 NOTE — ED Notes (Signed)
Informed pt that a urine sample was needed.

## 2012-11-18 NOTE — ED Provider Notes (Signed)
History     CSN: [id number]  Arrival date & time 11/18/12  4098   First MD Initiated Contact with Patient 11/18/12 1946      Chief Complaint  Patient presents with  . Headache  . Chest Pain    (Consider location/radiation/quality/duration/timing/severity/associated sxs/prior treatment) HPI Comments: Lisa Casey is a 52 y.o. female who complains of a persistent, headache, for 1 week, with blurred vision, and left-sided chest pain. She denies cough. She began having a fever today. She has not had rhinorrhea, ear pain, sore throat, or neck pain. Sometimes, she feels like she can't hold her head up. She denies abdominal pain, dysuria, urinary frequency, or diarrhea. She is not taking any medicine for the problem. He is using her usual medications, without relief. No other known modifying factors.  Patient is a 52 y.o. female presenting with headaches and chest pain. The history is provided by the patient.  Headache Chest Pain Associated symptoms: headache     Past Medical History  Diagnosis Date  . Fibromyalgia     PT ON LYRIC   . Hypertension     HX OF ELEVATED B/P FOR SEVERAL YRS--NO MEDS UNTIL LAST WEEK--PT WAS C/O OF CHEST PRESSURE AND B/P ELEVATED--PT SAW NURSE PRACTIONER ON 08/21/11 AND STATES EKG WAS DONE--TOLD ABNORMAL--PUT ON B/P MEDICATION LISINOPRIL AND TOLD SHE WAS TO SEE A CARDIOLOGIST.  [name] Shortness of breath     WITH EXERTION--NEW FOR PT--PT ALSO REPORTS FEELING TIRED  . GERD (gastroesophageal reflux disease)     NOT ON ANY MEDS AT PRESENT  . Headache     HX OF MIGRAINES  . Anxiety     HX OF ANXIETY ATTACKS  . Back pain, chronic     Past Surgical History  Procedure Laterality Date  . Tubal ligation    . Constipation    . Robotic assisted lap vaginal hysterectomy  09/14/2011    Procedure: ROBOTIC ASSISTED LAPAROSCOPIC VAGINAL HYSTERECTOMY;  Surgeon: Roseanna Rainbow, MD;  Location: [facility];  Service: Gynecology;  Laterality: N/A;  . Cystoscopy w/ ureteral  stent placement  09/23/2012    Procedure: CYSTOSCOPY WITH RETROGRADE PYELOGRAM/URETERAL STENT PLACEMENT;  Surgeon: Sebastian Ache, MD;  Location: [facility];  Service: Urology;  Laterality: Right;  . Cystoscopy with retrograde pyelogram, ureteroscopy and stent placement  10/08/2012    Procedure: CYSTOSCOPY WITH RETROGRADE PYELOGRAM, URETEROSCOPY AND STENT PLACEMENT;  Surgeon: Sebastian Ache, MD;  Location: [facility];  Service: Urology;  Laterality: Right;  90 MIN NO RETROGRADE NEEDS FLEX URETEROSCOPE   . Holmium laser application  10/08/2012    Procedure: HOLMIUM LASER APPLICATION;  Surgeon: Sebastian Ache, MD;  Location: [facility];  Service: Urology;  Laterality: Right;    History reviewed. No pertinent family history.  History  Substance Use Topics  . Smoking status: Never Smoker   . Smokeless tobacco: Never Used  . Alcohol Use: No    OB History   Grav Para Term Preterm Abortions TAB SAB Ect Mult Living                  Review of Systems  Cardiovascular: Positive for chest pain.  Neurological: Positive for headaches.  All other systems reviewed and are negative.    Allergies  Review of patient's allergies indicates no known allergies.  Home Medications   Current Outpatient Rx  Name  Route  Sig  Dispense  Refill  . DULoxetine (CYMBALTA) 60 MG capsule   Oral   Take 60  mg by mouth daily.         [name] estrogens, conjugated, (PREMARIN) 0.625 MG tablet   Oral   Take 0.625 mg by mouth daily.          [name] ibuprofen (ADVIL,MOTRIN) 200 MG tablet   Oral   Take 400 mg by mouth every 6 (six) hours as needed (pain).         [name] lisinopril-hydrochlorothiazide (PRINZIDE,ZESTORETIC) 10-12.5 MG per tablet   Oral   Take 1 tablet by mouth daily.         . sennosides-docusate sodium (SENOKOT-S) 8.6-50 MG tablet   Oral   Take 1 tablet by mouth 2 (two) times daily.   30 tablet   1     While taking pain meds to prevent constipation     BP 110/67   Pulse 86  Temp(Src) 98.5 F (36.9 C) (Oral)  Resp 18  SpO2 100%  LMP 08/04/2011  Physical Exam  Nursing note and vitals reviewed. Constitutional: She is oriented to person, place, and time. She appears well-developed and well-nourished.  HENT:  Head: Normocephalic and atraumatic.  Eyes: Conjunctivae and EOM are normal. Pupils are equal, round, and reactive to light.  Neck: Normal range of motion and phonation normal. Neck supple. No tracheal deviation present. No thyromegaly present.  No meningismus. She can fully flex her neck, to touch her chin against her chest without discomfort.  Cardiovascular: Normal rate, regular rhythm and intact distal pulses.   Pulmonary/Chest: Effort normal and breath sounds normal. She exhibits no tenderness.  Abdominal: Soft. She exhibits no distension. There is no tenderness. There is no guarding.  Musculoskeletal: Normal range of motion.  Neurological: She is alert and oriented to person, place, and time. She has normal strength. She exhibits normal muscle tone.  Skin: Skin is warm and dry.  Psychiatric: She has a normal mood and affect. Her behavior is normal. Judgment and thought content normal.    ED Course  Procedures (including critical care time)  Emergency department treatment: Tylenol and Percocet  Evaluation: 23:45. She is calm, and comfortable. Repeat vital signs are normal.       Date: 07/04/2012  Rate: 107  Rhythm: normal sinus rhythm  QRS Axis: normal  PR and QT Intervals: normal  ST/T Wave abnormalities: nonspecific ST changes  PR and QRS Conduction Disutrbances:none  Narrative Interpretation:   Old EKG Reviewed: changes noted     Labs Reviewed  URINALYSIS, ROUTINE W REFLEX MICROSCOPIC - Abnormal; Notable for the following:    APPearance CLOUDY (*)    Ketones, ur 15 (*)    Leukocytes, UA TRACE (*)    All other components within normal limits  URINE MICROSCOPIC-ADD ON - Abnormal; Notable for the following:     Squamous Epithelial / LPF FEW (*)    All other components within normal limits  URINE CULTURE  CBC WITH DIFFERENTIAL  BASIC METABOLIC PANEL  POCT I-STAT TROPONIN I   Dg Chest 2 View  11/18/2012  *RADIOLOGY REPORT*  Clinical Data: Chest pain and headache.  CHEST - 2 VIEW  Comparison: CT chest 09/21/2012.  Findings: Mild subsegmental atelectasis is seen in the lung bases. The lungs are otherwise clear.  Heart size is normal.  No pneumothorax or pleural effusion. Thoracolumbar scoliosis noted.  IMPRESSION: No acute disease.   Original Report Authenticated By: Holley Dexter, M.D.    Ct Head Wo Contrast  11/18/2012  *RADIOLOGY REPORT*  Clinical Data: Headache and left-sided chest pain.  CT HEAD WITHOUT  CONTRAST  Technique:  Contiguous axial images were obtained from the base of the skull through the vertex without contrast.  Comparison: None.  Findings: There is no evidence of acute infarction, mass lesion, or intra- or extra-axial hemorrhage on CT.  The posterior fossa, including the cerebellum, brainstem and fourth ventricle, is within normal limits.  The third and lateral ventricles, and basal ganglia are unremarkable in appearance.  The cerebral hemispheres are symmetric in appearance, with normal gray- white differentiation.  No mass effect or midline shift is seen.  There is no evidence of fracture; visualized osseous structures are unremarkable in appearance.  The orbits are within normal limits. The paranasal sinuses and mastoid air cells are well-aerated.  No significant soft tissue abnormalities are seen.  IMPRESSION: Unremarkable noncontrast CT of the head.   Original Report Authenticated By: Tonia Ghent, M.D.    Nursing notes, applicable records and vitals reviewed.  Radiologic Images/Reports reviewed.   1. Fever       MDM  Nonspecific febrile illness with reassuring evaluation. No evidence for sinusitis, meningitis, pneumonia, UTI. Doubt metabolic instability, serious bacterial  infection or impending vascular collapse; the patient is stable for discharge.   Plan: Home Medications- Tylenol prn; Home Treatments- Rest, fluids; Recommended follow up- PCP in 2-3 days       Flint Melter, MD 11/18/12 2350

## 2012-11-18 NOTE — ED Notes (Signed)
Pt complains of a headache and left sided chest pain since last week

## 2012-11-20 LAB — URINE CULTURE: Colony Count: >=100000

## 2012-11-22 ENCOUNTER — Telehealth (HOSPITAL_COMMUNITY): Payer: Self-pay | Admitting: Emergency Medicine

## 2012-11-22 NOTE — ED Notes (Signed)
+   Urine Chart sent to EDP office for review. 

## 2012-11-22 NOTE — ED Notes (Signed)
Patient has +Urine culture. Checking to see if treated appropriately. °

## 2012-11-25 NOTE — ED Notes (Signed)
Rx for Keflex-500 mg 1 QID x 5 days # 20 per Dr Juleen China

## 2012-11-29 ENCOUNTER — Telehealth (HOSPITAL_COMMUNITY): Payer: Self-pay | Admitting: Emergency Medicine

## 2012-12-09 ENCOUNTER — Other Ambulatory Visit: Payer: Self-pay | Admitting: Obstetrics & Gynecology

## 2012-12-09 DIAGNOSIS — Z1231 Encounter for screening mammogram for malignant neoplasm of breast: Secondary | ICD-10-CM

## 2012-12-12 ENCOUNTER — Ambulatory Visit (HOSPITAL_COMMUNITY)
Admission: RE | Admit: 2012-12-12 | Discharge: 2012-12-12 | Disposition: A | Discharge status: Home / Self Care | Payer: BC Managed Care – PPO | Source: Ambulatory Visit | Attending: Obstetrics & Gynecology | Admitting: Obstetrics & Gynecology

## 2012-12-12 DIAGNOSIS — Z1231 Encounter for screening mammogram for malignant neoplasm of breast: Secondary | ICD-10-CM

## 2013-02-27 ENCOUNTER — Encounter (HOSPITAL_COMMUNITY): Payer: Self-pay | Admitting: Emergency Medicine

## 2013-02-27 ENCOUNTER — Emergency Department (INDEPENDENT_AMBULATORY_CARE_PROVIDER_SITE_OTHER): Payer: BC Managed Care – PPO

## 2013-02-27 ENCOUNTER — Emergency Department (INDEPENDENT_AMBULATORY_CARE_PROVIDER_SITE_OTHER)
Admission: EM | Admit: 2013-02-27 | Discharge: 2013-02-27 | Disposition: A | Discharge status: Home / Self Care | Payer: BC Managed Care – PPO | Source: Home / Self Care | Attending: Emergency Medicine | Admitting: Emergency Medicine

## 2013-02-27 DIAGNOSIS — J209 Acute bronchitis, unspecified: Secondary | ICD-10-CM

## 2013-02-27 MED ORDER — DOXYCYCLINE HYCLATE 100 MG PO TABS: 100.0000 mg | ORAL_TABLET | Freq: Two times a day (BID) | ORAL | Status: DC

## 2013-02-27 MED ORDER — ALBUTEROL SULFATE HFA 108 (90 BASE) MCG/ACT IN AERS: 1.0000 | INHALATION_SPRAY | Freq: Four times a day (QID) | RESPIRATORY_TRACT | Status: DC | PRN

## 2013-02-27 MED ORDER — HYDROCOD POLST-CHLORPHEN POLST 10-8 MG/5ML PO LQCR: 5.0000 mL | Freq: Two times a day (BID) | ORAL | Status: DC | PRN

## 2013-02-27 NOTE — ED Notes (Signed)
Pt c/o cold sxs onset 3 weeks... Saw PCP last week.. Dx w/URI... Given amox 500mg , cough med and methylprednisolone... Still not feeling any better Sxs include: persistent productive cough, chest d/c due to cough, fevers, nasal congestion, post nasal drip Deneis: SOB, ST, vomiting. She is alert and oriented w/no signs of acute distress.

## 2013-02-27 NOTE — ED Provider Notes (Signed)
Chief Complaint:   Chief Complaint  Patient presents with  . URI    History of Present Illness:   Lisa Casey is a 52 year old female who presents with a three-week history of a cough productive of clear to green to yellow sputum. She denies any hemoptysis. Her chest has felt tight but there's been no wheezing. She has chest pain when she coughs but no pain with breathing. She denies any history of asthma or allergies. She had some fever at the onset and of the symptoms but none recently. She also has had nasal congestion, rhinorrhea, nausea, and sore throat. She saw her primary care physician, Dr. Lelon Perla last week and was given a steroid dose pack, cough syrup, amoxicillin. She feels no better today.  Review of Systems:  Other than noted above, the patient denies any of the following symptoms: Systemic:  No fevers, chills, sweats, weight loss or gain, fatigue, or tiredness. Eye:  No redness or discharge. ENT:  No ear pain, drainage, headache, nasal congestion, drainage, sinus pressure, difficulty swallowing, or sore throat. Neck:  No neck pain or swollen glands. Lungs:  No cough, sputum production, hemoptysis, wheezing, chest tightness, shortness of breath or chest pain. GI:  No abdominal pain, nausea, vomiting or diarrhea.  PMFSH:  Past medical history, family history, social history, meds, and allergies were reviewed. She has hypertension and fibromyalgia and takes lisinopril, hydrochlorothiazide, Cymbalta, and Premarin. 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  Physical Exam:   Vital signs:  BP 127/65  Pulse 79  Resp 18  SpO2 99%  LMP 08/04/2011 General:  Alert and oriented.  In no distress.  Skin warm and dry. Eye:  No conjunctival injection or drainage. Lids were normal. ENT:  TMs and canals were normal, without erythema or inflammation.  Nasal mucosa was clear and uncongested, without drainage.  Mucous membranes were moist.  Pharynx was clear with no exudate or  drainage.  There were no oral ulcerations or lesions. Neck:  Supple, no adenopathy, tenderness or mass. Lungs:  No respiratory distress.  Lungs were clear to auscultation, without wheezes, rales or rhonchi.  Breath sounds were clear and equal bilaterally.  Heart:  Regular rhythm, without gallops, murmers or rubs. Skin:  Clear, warm, and dry, without rash or lesions.  Radiology:  Dg Chest 2 View  02/27/2013   *RADIOLOGY REPORT*  Clinical Data: Productive cough  CHEST - 2 VIEW  Comparison: 11/18/2012  Findings: Dextroscoliosis thoracic spine again noted. Cardiomediastinal silhouette is stable.  No acute infiltrate or pleural effusion.  No pulmonary edema.  IMPRESSION: No active disease.  No significant change.   Original Report Authenticated By: Natasha Mead, M.D.   Assessment:  The encounter diagnosis was Acute bronchitis.  No evidence for pneumonia.  Plan:   1.  The following meds were prescribed:   Discharge Medication List as of 02/27/2013  8:35 PM    START taking these medications   Details  albuterol (PROVENTIL HFA;VENTOLIN HFA) 108 (90 BASE) MCG/ACT inhaler Inhale 1-2 puffs into the lungs every 6 (six) hours as needed for wheezing., Starting 02/27/2013, Until Discontinued, Normal    chlorpheniramine-HYDROcodone (TUSSIONEX) 10-8 MG/5ML LQCR Take 5 mLs by mouth every 12 (twelve) hours as needed., Starting 02/27/2013, Until Discontinued, Normal    doxycycline (VIBRA-TABS) 100 MG tablet Take 1 tablet (100 mg total) by mouth 2 (two) times daily., Starting 02/27/2013, Until Discontinued, Normal       2.  The patient was instructed in symptomatic care  and handouts were given. 3.  The patient was told to return if becoming worse in any way, if no better in 3 or 4 days, and given some red flag symptoms such as difficulty breathing or worsening pain that would indicate earlier return. 4.  Follow up here as necessary.      Reuben Likes, MD 02/27/13 2101

## 2013-03-05 ENCOUNTER — Encounter: Payer: Self-pay | Admitting: Obstetrics & Gynecology

## 2013-10-27 ENCOUNTER — Emergency Department (HOSPITAL_COMMUNITY)
Admission: EM | Admit: 2013-10-27 | Discharge: 2013-10-27 | Disposition: A | Discharge status: Home / Self Care | Payer: BC Managed Care – PPO | Attending: Emergency Medicine | Admitting: Emergency Medicine

## 2013-10-27 ENCOUNTER — Encounter (HOSPITAL_COMMUNITY): Payer: Self-pay | Admitting: Emergency Medicine

## 2013-10-27 DIAGNOSIS — K5289 Other specified noninfective gastroenteritis and colitis: Secondary | ICD-10-CM | POA: Insufficient documentation

## 2013-10-27 DIAGNOSIS — G8929 Other chronic pain: Secondary | ICD-10-CM | POA: Insufficient documentation

## 2013-10-27 DIAGNOSIS — F411 Generalized anxiety disorder: Secondary | ICD-10-CM | POA: Insufficient documentation

## 2013-10-27 DIAGNOSIS — Z90710 Acquired absence of both cervix and uterus: Secondary | ICD-10-CM | POA: Insufficient documentation

## 2013-10-27 DIAGNOSIS — K529 Noninfective gastroenteritis and colitis, unspecified: Secondary | ICD-10-CM

## 2013-10-27 DIAGNOSIS — Z79899 Other long term (current) drug therapy: Secondary | ICD-10-CM | POA: Insufficient documentation

## 2013-10-27 DIAGNOSIS — Z791 Long term (current) use of non-steroidal anti-inflammatories (NSAID): Secondary | ICD-10-CM | POA: Insufficient documentation

## 2013-10-27 DIAGNOSIS — Z87442 Personal history of urinary calculi: Secondary | ICD-10-CM | POA: Insufficient documentation

## 2013-10-27 DIAGNOSIS — Z8739 Personal history of other diseases of the musculoskeletal system and connective tissue: Secondary | ICD-10-CM | POA: Insufficient documentation

## 2013-10-27 DIAGNOSIS — I10 Essential (primary) hypertension: Secondary | ICD-10-CM | POA: Insufficient documentation

## 2013-10-27 DIAGNOSIS — Z9851 Tubal ligation status: Secondary | ICD-10-CM | POA: Insufficient documentation

## 2013-10-27 LAB — CBC WITH DIFFERENTIAL/PLATELET
BASOS PCT: 0 % (ref 0–1)
Basophils Absolute: 0 10*3/uL (ref 0.0–0.1)
EOS ABS: 0 10*3/uL (ref 0.0–0.7)
Eosinophils Relative: 0 % (ref 0–5)
HCT: 42.4 % (ref 36.0–46.0)
HEMOGLOBIN: 14.5 g/dL (ref 12.0–15.0)
LYMPHS ABS: 0.4 10*3/uL — AB (ref 0.7–4.0)
Lymphocytes Relative: 5 % — ABNORMAL LOW (ref 12–46)
MCH: 31.2 pg (ref 26.0–34.0)
MCHC: 34.2 g/dL (ref 30.0–36.0)
MCV: 91.2 fL (ref 78.0–100.0)
MONOS PCT: 4 % (ref 3–12)
Monocytes Absolute: 0.3 10*3/uL (ref 0.1–1.0)
NEUTROS ABS: 6.2 10*3/uL (ref 1.7–7.7)
NEUTROS PCT: 91 % — AB (ref 43–77)
PLATELETS: 224 10*3/uL (ref 150–400)
RBC: 4.65 MIL/uL (ref 3.87–5.11)
RDW: 12 % (ref 11.5–15.5)
WBC: 6.8 10*3/uL (ref 4.0–10.5)

## 2013-10-27 LAB — COMPREHENSIVE METABOLIC PANEL
ALBUMIN: 3.8 g/dL (ref 3.5–5.2)
ALK PHOS: 68 U/L (ref 39–117)
ALT: 17 U/L (ref 0–35)
AST: 26 U/L (ref 0–37)
BILIRUBIN TOTAL: 1.3 mg/dL — AB (ref 0.3–1.2)
BUN: 20 mg/dL (ref 6–23)
CHLORIDE: 102 meq/L (ref 96–112)
CO2: 22 mEq/L (ref 19–32)
Calcium: 9.3 mg/dL (ref 8.4–10.5)
Creatinine, Ser: 0.7 mg/dL (ref 0.50–1.10)
GFR calc Af Amer: >90 mL/min (ref >90)
GFR calc non Af Amer: >90 mL/min (ref >90)
Glucose, Bld: 120 mg/dL — ABNORMAL HIGH (ref 70–99)
POTASSIUM: 3.6 meq/L — AB (ref 3.7–5.3)
SODIUM: 139 meq/L (ref 137–147)
TOTAL PROTEIN: 7.5 g/dL (ref 6.0–8.3)

## 2013-10-27 LAB — URINALYSIS, ROUTINE W REFLEX MICROSCOPIC
BILIRUBIN URINE: NEGATIVE
Glucose, UA: NEGATIVE mg/dL
Hgb urine dipstick: NEGATIVE
Ketones, ur: 40 mg/dL — AB
LEUKOCYTES UA: NEGATIVE
NITRITE: NEGATIVE
PH: 8.5 — AB (ref 5.0–8.0)
Protein, ur: NEGATIVE mg/dL
SPECIFIC GRAVITY, URINE: 1.02 (ref 1.005–1.030)
UROBILINOGEN UA: 1 mg/dL (ref 0.0–1.0)

## 2013-10-27 LAB — LIPASE, BLOOD: Lipase: 24 U/L (ref 11–59)

## 2013-10-27 MED ORDER — SODIUM CHLORIDE 0.9 % IV SOLN
Freq: Once | INTRAVENOUS | Status: AC
  Administered 2013-10-27: 07:00:00 via INTRAVENOUS

## 2013-10-27 MED ORDER — HYDROCODONE-ACETAMINOPHEN 5-325 MG PO TABS: 2.0000 | ORAL_TABLET | ORAL | Status: DC | PRN

## 2013-10-27 MED ORDER — PROMETHAZINE HCL 25 MG PO TABS: 25.0000 mg | ORAL_TABLET | Freq: Four times a day (QID) | ORAL | Status: DC | PRN

## 2013-10-27 MED ORDER — SODIUM CHLORIDE 0.9 % IV BOLUS (SEPSIS)
1000.0000 mL | Freq: Once | INTRAVENOUS | Status: AC
  Administered 2013-10-27: 1000 mL via INTRAVENOUS

## 2013-10-27 MED ORDER — ONDANSETRON HCL 4 MG/2ML IJ SOLN
4.0000 mg | Freq: Once | INTRAMUSCULAR | Status: AC
  Administered 2013-10-27: 4 mg via INTRAVENOUS
  Filled 2013-10-27: qty 2

## 2013-10-27 MED ORDER — MORPHINE SULFATE 4 MG/ML IJ SOLN
4.0000 mg | Freq: Once | INTRAMUSCULAR | Status: AC
  Administered 2013-10-27: 4 mg via INTRAVENOUS
  Filled 2013-10-27: qty 1

## 2013-10-27 MED ORDER — PROMETHAZINE HCL 25 MG/ML IJ SOLN
25.0000 mg | Freq: Once | INTRAMUSCULAR | Status: AC
  Administered 2013-10-27: 25 mg via INTRAVENOUS
  Filled 2013-10-27: qty 1

## 2013-10-27 MED ORDER — NAPROXEN 500 MG PO TABS: 500.0000 mg | ORAL_TABLET | Freq: Two times a day (BID) | ORAL | Status: DC

## 2013-10-27 MED ORDER — ONDANSETRON 4 MG PO TBDP: 4.0000 mg | ORAL_TABLET | Freq: Three times a day (TID) | ORAL | Status: DC | PRN

## 2013-10-27 NOTE — ED Provider Notes (Signed)
CSN: [id number]     Arrival date & time 10/27/13  0534 History   First MD Initiated Contact with Patient 10/27/13 [phone]     Chief Complaint  Patient presents with  . Flank Pain     (Consider location/radiation/quality/duration/timing/severity/associated sxs/prior Treatment) HPI Comments: 53 year old female, history of hypertension, acid reflux, and fibromyalgia. She states that she has had kidney stones in the past. She presents with acute onset of right-sided abdominal pain which started last night at 8:30, 10 hours prior to arrival. This has been persistent and associated with multiple episodes of nausea vomiting and watery diarrhea. She denies any recent surgery, no travel, has not had any medications prior to arrival, paramedics transported the patient, no medications given. Symptoms are persistent, nothing makes better or worse. The pain radiates from her right midabdomen around to the right lower back and states that she feels the pain radiate into her right hip as well  Patient is a 53 y.o. female presenting with flank pain. The history is provided by the patient.  Flank Pain    Past Medical History  Diagnosis Date  . Fibromyalgia     PT ON LYRIC   . Hypertension     HX OF ELEVATED B/P FOR SEVERAL YRS--NO MEDS UNTIL LAST WEEK--PT WAS C/O OF CHEST PRESSURE AND B/P ELEVATED--PT SAW NURSE PRACTIONER ON 08/21/11 AND STATES EKG WAS DONE--TOLD ABNORMAL--PUT ON B/P MEDICATION LISINOPRIL AND TOLD SHE WAS TO SEE A CARDIOLOGIST.  [name] Shortness of breath     WITH EXERTION--NEW FOR PT--PT ALSO REPORTS FEELING TIRED  . GERD (gastroesophageal reflux disease)     NOT ON ANY MEDS AT PRESENT  . Headache(784.0)     HX OF MIGRAINES  . Anxiety     HX OF ANXIETY ATTACKS  . Back pain, chronic    Past Surgical History  Procedure Laterality Date  . Tubal ligation    . Constipation    . Robotic assisted lap vaginal hysterectomy  09/14/2011    Procedure: ROBOTIC ASSISTED LAPAROSCOPIC VAGINAL  HYSTERECTOMY;  Surgeon: Agnes Lawrence, MD;  Location: [facility];  Service: Gynecology;  Laterality: N/A;  . Cystoscopy w/ ureteral stent placement  09/23/2012    Procedure: CYSTOSCOPY WITH RETROGRADE PYELOGRAM/URETERAL STENT PLACEMENT;  Surgeon: Alexis Frock, MD;  Location: [facility];  Service: Urology;  Laterality: Right;  . Cystoscopy with retrograde pyelogram, ureteroscopy and stent placement  10/08/2012    Procedure: CYSTOSCOPY WITH RETROGRADE PYELOGRAM, URETEROSCOPY AND STENT PLACEMENT;  Surgeon: Alexis Frock, MD;  Location[facility];  Service: Urology;  Laterality: Right;  90 MIN NO RETROGRADE NEEDS FLEX URETEROSCOPE   . Holmium laser application  [id number]    Procedure: HOLMIUM LASER APPLICATION;  Surgeon: Alexis Frock, MD;  Location: [facility];  Service: Urology;  Laterality: Right;   History reviewed. No pertinent family history. History  Substance Use Topics  . Smoking status: Never Smoker   . Smokeless tobacco: Never Used  . Alcohol Use: No   OB History   Grav Para Term Preterm Abortions TAB SAB Ect Mult Living                 Review of Systems  Genitourinary: Positive for flank pain.  All other systems reviewed and are negative.      Allergies  Review of patient's allergies indicates no known allergies.  Home Medications   Current Outpatient Rx  Name  Route  Sig  Dispense  Refill  . cyclobenzaprine (FLEXERIL) 10 MG tablet  Oral   Take 10 mg by mouth 3 (three) times daily as needed for muscle spasms.         . DULoxetine (CYMBALTA) 60 MG capsule   Oral   Take 60 mg by mouth daily.         [name] estrogens, conjugated, (PREMARIN) 0.625 MG tablet   Oral   Take 0.625 mg by mouth daily.          [name] lisinopril-hydrochlorothiazide (PRINZIDE,ZESTORETIC) 10-12.5 MG per tablet   Oral   Take 1 tablet by mouth daily.         [name] HYDROcodone-acetaminophen (NORCO/VICODIN) 5-325 MG per tablet   Oral   Take 2 tablets by mouth  every 4 (four) hours as needed.   6 tablet   0   . ibuprofen (ADVIL,MOTRIN) 200 MG tablet   Oral   Take 400 mg by mouth every 6 (six) hours as needed (pain).         . naproxen (NAPROSYN) 500 MG tablet   Oral   Take 1 tablet (500 mg total) by mouth 2 (two) times daily with a meal.   30 tablet   0   . ondansetron (ZOFRAN ODT) 4 MG disintegrating tablet   Oral   Take 1 tablet (4 mg total) by mouth every 8 (eight) hours as needed for nausea.   10 tablet   0   . promethazine (PHENERGAN) 25 MG tablet   Oral   Take 1 tablet (25 mg total) by mouth every 6 (six) hours as needed for nausea or vomiting.   12 tablet   0    BP 138/112  Pulse 92  Temp(Src) 99.3 F (37.4 C) (Oral)  Resp 18  SpO2 100%  LMP 08/04/2011 Physical Exam  Nursing note and vitals reviewed. Constitutional: She appears well-developed and well-nourished.  Uncomfortable appearing  HENT:  Head: Normocephalic and atraumatic.  Mouth/Throat: Oropharynx is clear and moist. No oropharyngeal exudate.  Eyes: Conjunctivae and EOM are normal. Pupils are equal, round, and reactive to light. Right eye exhibits no discharge. Left eye exhibits no discharge. No scleral icterus.  Neck: Normal range of motion. Neck supple. No JVD present. No thyromegaly present.  Cardiovascular: Normal rate, regular rhythm, normal heart sounds and intact distal pulses.  Exam reveals no gallop and no friction rub.   No murmur heard. Pulmonary/Chest: Effort normal and breath sounds normal. No respiratory distress. She has no wheezes. She has no rales.  Abdominal: Soft. Bowel sounds are normal. She exhibits no distension and no mass.  No focal tenderness on palpation, very soft abdomen, slightly increased bowel sounds  Musculoskeletal: Normal range of motion. She exhibits no edema and no tenderness.  Lymphadenopathy:    She has no cervical adenopathy.  Neurological: She is alert. Coordination normal.  Skin: Skin is warm and dry. No rash  noted. No erythema.  Psychiatric: She has a normal mood and affect. Her behavior is normal.    ED Course  Procedures (including critical care time) Labs Review Labs Reviewed  URINALYSIS, ROUTINE W REFLEX MICROSCOPIC - Abnormal; Notable for the following:    pH 8.5 (*)    Ketones, ur 40 (*)    All other components within normal limits  CBC WITH DIFFERENTIAL - Abnormal; Notable for the following:    Neutrophils Relative % 91 (*)    Lymphocytes Relative 5 (*)    Lymphs Abs 0.4 (*)    All other components within normal limits  COMPREHENSIVE METABOLIC PANEL - Abnormal;  Notable for the following:    Potassium 3.6 (*)    Glucose, Bld 120 (*)    Total Bilirubin 1.3 (*)    All other components within normal limits  LIPASE, BLOOD   Imaging Review No results found.  EKG Interpretation   None       MDM   Final diagnoses:  Gastroenteritis    The patient is primarily gastrointestinal symptoms with nausea vomiting diarrhea. Quick bedside ultrasound shows no signs of hydronephrosis, gallbladder visualized, no signs of gallstones on brief one view of the gallbladder. No significant reproducible tenderness in the abdomen either. This raises some suspicion for gastroenteritis versus kidney stone versus cholecystitis the former being more likely. Labs, fluids, symptomatic medications.  6:45 AM, patient reevaluated and states that she feels much better. No longer having nausea, pain is minimal, laboratory workup shows no leukocytosis, normal liver function, normal lipase and clean urinalysis with signs of mild dehydration with ketonuria. IV fluids given, patient informed that she'll be able to be discharged home with gastroenteritis treatment protocol and instructions. She is in agreement with the plan.  PO trial successful  Meds given in ED:  Medications  0.9 %  sodium chloride infusion (not administered)  promethazine (PHENERGAN) injection 25 mg (not administered)  ondansetron (ZOFRAN)  injection 4 mg (4 mg Intravenous Given 10/27/13 0618)  sodium chloride 0.9 % bolus 1,000 mL (1,000 mLs Intravenous New Bag/Given 10/27/13 0618)  morphine 4 MG/ML injection 4 mg (4 mg Intravenous Given 10/27/13 0618)    New Prescriptions   HYDROCODONE-ACETAMINOPHEN (NORCO/VICODIN) 5-325 MG PER TABLET    Take 2 tablets by mouth every 4 (four) hours as needed.   NAPROXEN (NAPROSYN) 500 MG TABLET    Take 1 tablet (500 mg total) by mouth 2 (two) times daily with a meal.   ONDANSETRON (ZOFRAN ODT) 4 MG DISINTEGRATING TABLET    Take 1 tablet (4 mg total) by mouth every 8 (eight) hours as needed for nausea.   PROMETHAZINE (PHENERGAN) 25 MG TABLET    Take 1 tablet (25 mg total) by mouth every 6 (six) hours as needed for nausea or vomiting.      Johnna Acosta, MD 10/27/13 [phone]

## 2013-10-27 NOTE — ED Notes (Signed)
Brought in by PTAR from home with c/o severe abdominal pain with nausea and vomiting.  Per PT[facility], pt reported that she started having abdominal pain last night; pt states, "Pain is similar to that when I had kidney stones".

## 2013-10-27 NOTE — ED Notes (Signed)
Bed: [facility]02 Expected date:  Expected time:  Means of arrival:  Comments: EMS/N/V/D fever

## 2013-10-27 NOTE — Discharge Instructions (Signed)
Your testing was normal - it showed some dehydration but no signs of bacterial infection, kidney stones or gall stones.  Please follow up with your doctor in next 2 days if no improvement - read the attached instructions.    Please call your doctor for a followup appointment within 24-48 hours. When you talk to your doctor please let them know that you were seen in the emergency department and have them acquire all of your records so that they can discuss the findings with you and formulate a treatment plan to fully care for your new and ongoing problems.  Nausea medicine:  Zofran or Phenergan  Pain medicine:  Naprosyn twice daily - hydrocodone for severe pain

## 2013-10-27 NOTE — ED Notes (Signed)
Pt able to drink 2 cups of water and eat saltine crackers-- tolerated well, no vomiting noted.

## 2013-10-28 ENCOUNTER — Encounter (HOSPITAL_COMMUNITY): Payer: Self-pay | Admitting: Emergency Medicine

## 2013-10-28 ENCOUNTER — Emergency Department (HOSPITAL_COMMUNITY)
Admission: EM | Admit: 2013-10-28 | Discharge: 2013-10-28 | Disposition: A | Discharge status: Home / Self Care | Payer: BC Managed Care – PPO | Attending: Emergency Medicine | Admitting: Emergency Medicine

## 2013-10-28 DIAGNOSIS — Z791 Long term (current) use of non-steroidal anti-inflammatories (NSAID): Secondary | ICD-10-CM | POA: Insufficient documentation

## 2013-10-28 DIAGNOSIS — Z8719 Personal history of other diseases of the digestive system: Secondary | ICD-10-CM | POA: Insufficient documentation

## 2013-10-28 DIAGNOSIS — I10 Essential (primary) hypertension: Secondary | ICD-10-CM | POA: Insufficient documentation

## 2013-10-28 DIAGNOSIS — E860 Dehydration: Secondary | ICD-10-CM | POA: Insufficient documentation

## 2013-10-28 DIAGNOSIS — Z79899 Other long term (current) drug therapy: Secondary | ICD-10-CM | POA: Insufficient documentation

## 2013-10-28 DIAGNOSIS — IMO0001 Reserved for inherently not codable concepts without codable children: Secondary | ICD-10-CM | POA: Insufficient documentation

## 2013-10-28 DIAGNOSIS — R519 Headache, unspecified: Secondary | ICD-10-CM

## 2013-10-28 DIAGNOSIS — R51 Headache: Secondary | ICD-10-CM | POA: Insufficient documentation

## 2013-10-28 DIAGNOSIS — F411 Generalized anxiety disorder: Secondary | ICD-10-CM | POA: Insufficient documentation

## 2013-10-28 DIAGNOSIS — G8929 Other chronic pain: Secondary | ICD-10-CM | POA: Insufficient documentation

## 2013-10-28 DIAGNOSIS — Z8619 Personal history of other infectious and parasitic diseases: Secondary | ICD-10-CM | POA: Insufficient documentation

## 2013-10-28 LAB — COMPREHENSIVE METABOLIC PANEL
ALBUMIN: 3.4 g/dL — AB (ref 3.5–5.2)
ALT: 17 U/L (ref 0–35)
AST: 22 U/L (ref 0–37)
Alkaline Phosphatase: 66 U/L (ref 39–117)
BILIRUBIN TOTAL: 0.6 mg/dL (ref 0.3–1.2)
BUN: 14 mg/dL (ref 6–23)
CALCIUM: 8.7 mg/dL (ref 8.4–10.5)
CHLORIDE: 103 meq/L (ref 96–112)
CO2: 24 mEq/L (ref 19–32)
CREATININE: 0.83 mg/dL (ref 0.50–1.10)
GFR calc Af Amer: >90 mL/min (ref >90)
GFR calc non Af Amer: 79 mL/min — ABNORMAL LOW (ref >90)
Glucose, Bld: 99 mg/dL (ref 70–99)
Potassium: 3.6 mEq/L — ABNORMAL LOW (ref 3.7–5.3)
Sodium: 139 mEq/L (ref 137–147)
TOTAL PROTEIN: 7.2 g/dL (ref 6.0–8.3)

## 2013-10-28 LAB — URINE MICROSCOPIC-ADD ON

## 2013-10-28 LAB — URINALYSIS, ROUTINE W REFLEX MICROSCOPIC
Glucose, UA: NEGATIVE mg/dL
KETONES UR: 40 mg/dL — AB
Leukocytes, UA: NEGATIVE
NITRITE: NEGATIVE
Protein, ur: NEGATIVE mg/dL
Specific Gravity, Urine: 1.025 (ref 1.005–1.030)
Urobilinogen, UA: 1 mg/dL (ref 0.0–1.0)
pH: 5.5 (ref 5.0–8.0)

## 2013-10-28 LAB — CBC WITH DIFFERENTIAL/PLATELET
BASOS ABS: 0 10*3/uL (ref 0.0–0.1)
BASOS PCT: 0 % (ref 0–1)
EOS ABS: 0 10*3/uL (ref 0.0–0.7)
EOS PCT: 1 % (ref 0–5)
HEMATOCRIT: 41.2 % (ref 36.0–46.0)
HEMOGLOBIN: 14 g/dL (ref 12.0–15.0)
Lymphocytes Relative: 32 % (ref 12–46)
Lymphs Abs: 1.1 10*3/uL (ref 0.7–4.0)
MCH: 31.5 pg (ref 26.0–34.0)
MCHC: 34 g/dL (ref 30.0–36.0)
MCV: 92.6 fL (ref 78.0–100.0)
MONO ABS: 0.5 10*3/uL (ref 0.1–1.0)
MONOS PCT: 15 % — AB (ref 3–12)
Neutro Abs: 1.7 10*3/uL (ref 1.7–7.7)
Neutrophils Relative %: 52 % (ref 43–77)
Platelets: 210 10*3/uL (ref 150–400)
RBC: 4.45 MIL/uL (ref 3.87–5.11)
RDW: 12.1 % (ref 11.5–15.5)
WBC: 3.3 10*3/uL — ABNORMAL LOW (ref 4.0–10.5)

## 2013-10-28 MED ORDER — OXYCODONE-ACETAMINOPHEN 5-325 MG PO TABS
1.0000 | ORAL_TABLET | Freq: Once | ORAL | Status: AC
  Administered 2013-10-28: 1 via ORAL
  Filled 2013-10-28: qty 1

## 2013-10-28 MED ORDER — SODIUM CHLORIDE 0.9 % IV BOLUS (SEPSIS)
1000.0000 mL | Freq: Once | INTRAVENOUS | Status: AC
  Administered 2013-10-28: 1000 mL via INTRAVENOUS

## 2013-10-28 NOTE — ED Provider Notes (Signed)
History/physical exam/procedure(s) were performed by non-physician practitioner and as supervising physician I was immediately available for consultation/collaboration. I have reviewed all notes and am in agreement with care and plan.   Lorraine Cimmino S Havana Baldwin, MD 10/28/13 2329 

## 2013-10-28 NOTE — Discharge Instructions (Signed)
Dehydration, Adult Dehydration is when you lose more fluids from the body than you take in. Vital organs like the kidneys, brain, and heart cannot function without a proper amount of fluids and salt. Any loss of fluids from the body can cause dehydration.  CAUSES   Vomiting.  Diarrhea.  Excessive sweating.  Excessive urine output.  Fever. SYMPTOMS  Mild dehydration  Thirst.  Dry lips.  Slightly dry mouth. Moderate dehydration  Very dry mouth.  Sunken eyes.  Skin does not bounce back quickly when lightly pinched and released.  Dark urine and decreased urine production.  Decreased tear production.  Headache. Severe dehydration  Very dry mouth.  Extreme thirst.  Rapid, weak pulse (more than 100 beats per minute at rest).  Cold hands and feet.  Not able to sweat in spite of heat and temperature.  Rapid breathing.  Blue lips.  Confusion and lethargy.  Difficulty being awakened.  Minimal urine production.  No tears. DIAGNOSIS  Your caregiver will diagnose dehydration based on your symptoms and your exam. Blood and urine tests will help confirm the diagnosis. The diagnostic evaluation should also identify the cause of dehydration. TREATMENT  Treatment of mild or moderate dehydration can often be done at home by increasing the amount of fluids that you drink. It is best to drink small amounts of fluid more often. Drinking too much at one time can make vomiting worse. Refer to the home care instructions below. Severe dehydration needs to be treated at the hospital where you will probably be given intravenous (IV) fluids that contain water and electrolytes. HOME CARE INSTRUCTIONS   Ask your caregiver about specific rehydration instructions.  Drink enough fluids to keep your urine clear or pale yellow.  Drink small amounts frequently if you have nausea and vomiting.  Eat as you normally do.  Avoid:  Foods or drinks high in sugar.  Carbonated  drinks.  Juice.  Extremely hot or cold fluids.  Drinks with caffeine.  Fatty, greasy foods.  Alcohol.  Tobacco.  Overeating.  Gelatin desserts.  Wash your hands well to avoid spreading bacteria and viruses.  Only take over-the-counter or prescription medicines for pain, discomfort, or fever as directed by your caregiver.  Ask your caregiver if you should continue all prescribed and over-the-counter medicines.  Keep all follow-up appointments with your caregiver. SEEK MEDICAL CARE IF:  You have abdominal pain and it increases or stays in one area (localizes).  You have a rash, stiff neck, or severe headache.  You are irritable, sleepy, or difficult to awaken.  You are weak, dizzy, or extremely thirsty. SEEK IMMEDIATE MEDICAL CARE IF:   You are unable to keep fluids down or you get worse despite treatment.  You have frequent episodes of vomiting or diarrhea.  You have blood or green matter (bile) in your vomit.  You have blood in your stool or your stool looks black and tarry.  You have not urinated in 6 to 8 hours, or you have only urinated a small amount of very dark urine.  You have a fever.  You faint. MAKE SURE YOU:   Understand these instructions.  Will watch your condition.  Will get help right away if you are not doing well or get worse. Document Released: 09/03/2005 Document Revised: 11/26/2011 Document Reviewed: 04/23/2011 [employer] Patient Information 2014 [employer].  

## 2013-10-28 NOTE — ED Notes (Addendum)
Pt presents asking for fluid, after being sent by DOT physician.  Pt c/o headache and "a little" weakness.  Pain score 8/10.  Pt was seen yesterday for gastroenteritis.  Pt sts "he told me that my ketones were high."

## 2013-10-28 NOTE — ED Provider Notes (Signed)
CSN: [id number]     Arrival date & time 10/28/13  1529 History   First MD Initiated Contact with Patient 10/28/13 1909     Chief Complaint  Patient presents with  . Headache  . Sent by PCP      (Consider location/radiation/quality/duration/timing/severity/associated sxs/prior Treatment) HPI   Patient presents to the emergency department as recommended by primary care doctor while getting a DOT physical for having ketones found in the urine. Patient was seen yesterday and diagnosed with viral gastroenteritis and sent home with medications. She has since been stopped having any diarrhea or vomiting but admits that she has not ate or drank much. Before her visit yesterday she gradually developed a headache that is located the central top of her head and is more of like a dull ache. She states that she has had headaches before but that this feels different than her normal headache. She's not had any other associated symptoms of blurred vision, confusion, aphasia, fevers, chills, focal or generalized weakness. She has not tried anything at home for the headache. Past Medical History  Diagnosis Date  . Fibromyalgia     PT ON LYRIC   . Hypertension     HX OF ELEVATED B/P FOR SEVERAL YRS--NO MEDS UNTIL LAST WEEK--PT WAS C/O OF CHEST PRESSURE AND B/P ELEVATED--PT SAW NURSE PRACTIONER ON 08/21/11 AND STATES EKG WAS DONE--TOLD ABNORMAL--PUT ON B/P MEDICATION LISINOPRIL AND TOLD SHE WAS TO SEE A CARDIOLOGIST.  [name] Shortness of breath     WITH EXERTION--NEW FOR PT--PT ALSO REPORTS FEELING TIRED  . GERD (gastroesophageal reflux disease)     NOT ON ANY MEDS AT PRESENT  . Headache(784.0)     HX OF MIGRAINES  . Anxiety     HX OF ANXIETY ATTACKS  . Back pain, chronic    Past Surgical History  Procedure Laterality Date  . Tubal ligation    . Constipation    . Robotic assisted lap vaginal hysterectomy  09/14/2011    Procedure: ROBOTIC ASSISTED LAPAROSCOPIC VAGINAL HYSTERECTOMY;  Surgeon: Agnes Lawrence, MD;  Location: [facility];  Service: Gynecology;  Laterality: N/A;  . Cystoscopy w/ ureteral stent placement  09/23/2012    Procedure: CYSTOSCOPY WITH RETROGRADE PYELOGRAM/URETERAL STENT PLACEMENT;  Surgeon: Alexis Frock, MD;  Location: [facility];  Service: Urology;  Laterality: Right;  . Cystoscopy with retrograde pyelogram, ureteroscopy and stent placement  10/08/2012    Procedure: CYSTOSCOPY WITH RETROGRADE PYELOGRAM, URETEROSCOPY AND STENT PLACEMENT;  Surgeon: Alexis Frock, MD;  Location: [facility];  Service: Urology;  Laterality: Right;  90 MIN NO RETROGRADE NEEDS FLEX URETEROSCOPE   . Holmium laser application  12/24/8117    Procedure: HOLMIUM LASER APPLICATION;  Surgeon: Alexis Frock, MD;  Location: [facility];  Service: Urology;  Laterality: Right;   History reviewed. No pertinent family history. History  Substance Use Topics  . Smoking status: Never Smoker   . Smokeless tobacco: Never Used  . Alcohol Use: No   OB History   Grav Para Term Preterm Abortions TAB SAB Ect Mult Living                 Review of Systems  The patient denies anorexia, fever, weight loss,, vision loss, decreased hearing, hoarseness, chest pain, syncope, dyspnea on exertion, peripheral edema, balance deficits, hemoptysis, abdominal pain, melena, hematochezia, severe indigestion/heartburn, hematuria, incontinence, genital sores, muscle weakness, suspicious skin lesions, transient blindness, difficulty walking, depression, unusual weight change, abnormal bleeding, enlarged lymph nodes, angioedema, and breast  masses.   Allergies  Review of patient's allergies indicates no known allergies.  Home Medications   Current Outpatient Rx  Name  Route  Sig  Dispense  Refill  . cyclobenzaprine (FLEXERIL) 10 MG tablet   Oral   Take 10 mg by mouth 3 (three) times daily as needed for muscle spasms.         . DULoxetine (CYMBALTA) 60 MG capsule   Oral   Take 60 mg by  mouth daily.         [name] estrogens, conjugated, (PREMARIN) 0.625 MG tablet   Oral   Take 0.625 mg by mouth daily.          [name] HYDROcodone-acetaminophen (NORCO/VICODIN) 5-325 MG per tablet   Oral   Take 2 tablets by mouth every 4 (four) hours as needed.   6 tablet   0   . ibuprofen (ADVIL,MOTRIN) 200 MG tablet   Oral   Take 400 mg by mouth every 6 (six) hours as needed (pain).         [name] lisinopril-hydrochlorothiazide (PRINZIDE,ZESTORETIC) 10-12.5 MG per tablet   Oral   Take 1 tablet by mouth daily.         . naproxen (NAPROSYN) 500 MG tablet   Oral   Take 1 tablet (500 mg total) by mouth 2 (two) times daily with a meal.   30 tablet   0   . ondansetron (ZOFRAN ODT) 4 MG disintegrating tablet   Oral   Take 1 tablet (4 mg total) by mouth every 8 (eight) hours as needed for nausea.   10 tablet   0   . promethazine (PHENERGAN) 25 MG tablet   Oral   Take 1 tablet (25 mg total) by mouth every 6 (six) hours as needed for nausea or vomiting.   12 tablet   0    BP 134/77  Pulse 83  Temp(Src) 98.6 F (37 C) (Oral)  Resp 16  Ht 5\' 5"  (1.651 m)  Wt 186 lb (84.369 kg)  BMI 30.95 kg/m2  SpO2 97%  LMP 08/04/2011 Physical Exam  Nursing note and vitals reviewed. Constitutional: She is oriented to person, place, and time. She appears well-developed and well-nourished. No distress.  Mucus membranes are dry  HENT:  Head: Normocephalic and atraumatic.  Right Ear: External ear normal.  Left Ear: External ear normal.  Eyes: Pupils are equal, round, and reactive to light.  Neck: Normal range of motion. Neck supple.  Cardiovascular: Normal rate and regular rhythm.   Pulmonary/Chest: Effort normal.  Abdominal: Soft.  Neurological: She is alert and oriented to person, place, and time. She has normal strength. No cranial nerve deficit or sensory deficit.  Skin: Skin is warm and dry. She is not diaphoretic.    ED Course  Procedures (including critical care time) Labs  Review Labs Reviewed  CBC WITH DIFFERENTIAL - Abnormal; Notable for the following:    WBC 3.3 (*)    Monocytes Relative 15 (*)    All other components within normal limits  COMPREHENSIVE METABOLIC PANEL - Abnormal; Notable for the following:    Potassium 3.6 (*)    Albumin 3.4 (*)    GFR calc non Af Amer 79 (*)    All other components within normal limits  URINALYSIS, ROUTINE W REFLEX MICROSCOPIC - Abnormal; Notable for the following:    Color, Urine AMBER (*)    APPearance CLOUDY (*)    Hgb urine dipstick SMALL (*)    Bilirubin Urine SMALL (*)  Ketones, ur 40 (*)    All other components within normal limits  URINE MICROSCOPIC-ADD ON - Abnormal; Notable for the following:    Squamous Epithelial / LPF FEW (*)    All other components within normal limits   Imaging Review No results found.  EKG Interpretation   None       MDM   Final diagnoses:  Headache  Dehydration   Laboratory work is reassuring. She is not longer having belly pains, nausea, vomiting or diarrhea. She does admit to eating and drinking less. The DOT provider became concerned because of ketones in her urine and sent her here. She is also having a headache, most likely due to dehydration.  1percocet PO 2 L NS given IV in the ED for rehydration.   Patient had complete resolution of headache. She stood up and ambulated without any concerns or symptoms. She received the two L for rehydration. Her lab results are not acute requiring admission.  53 y.o.Lisa Casey Propst's evaluation in the Emergency Department is complete. It has been determined that no acute conditions requiring further emergency intervention are present at this time. The patient/guardian have been advised of the diagnosis and plan. We have discussed signs and symptoms that warrant return to the ED, such as changes or worsening in symptoms.  Vital signs are stable at discharge. Filed Vitals:   10/28/13 2228  BP: 134/77  Pulse: 83  Temp:  98.6 F (37 C)  Resp: 16    Patient/guardian has voiced understanding and agreed to follow-up with the PCP or specialist.    Linus Mako, PA-C 10/28/13 2315

## 2014-05-17 ENCOUNTER — Other Ambulatory Visit (HOSPITAL_COMMUNITY): Payer: Self-pay | Admitting: Internal Medicine

## 2014-05-17 ENCOUNTER — Other Ambulatory Visit: Payer: Self-pay

## 2014-05-17 ENCOUNTER — Other Ambulatory Visit (HOSPITAL_COMMUNITY): Payer: Self-pay

## 2014-05-17 DIAGNOSIS — Z1231 Encounter for screening mammogram for malignant neoplasm of breast: Secondary | ICD-10-CM

## 2014-05-21 ENCOUNTER — Ambulatory Visit (HOSPITAL_COMMUNITY): Payer: BC Managed Care – PPO

## 2014-05-26 ENCOUNTER — Ambulatory Visit (HOSPITAL_COMMUNITY)
Admission: RE | Admit: 2014-05-26 | Discharge: 2014-05-26 | Disposition: A | Discharge status: Home / Self Care | Payer: BC Managed Care – PPO | Source: Ambulatory Visit | Attending: Internal Medicine | Admitting: Internal Medicine

## 2014-05-26 DIAGNOSIS — Z1231 Encounter for screening mammogram for malignant neoplasm of breast: Secondary | ICD-10-CM | POA: Diagnosis present

## 2014-07-15 ENCOUNTER — Other Ambulatory Visit: Payer: Self-pay | Admitting: Obstetrics & Gynecology

## 2014-07-16 NOTE — Telephone Encounter (Signed)
Please advise 

## 2014-07-18 NOTE — Telephone Encounter (Signed)
OK to refill if up to date w/annual exams

## 2014-07-20 NOTE — Telephone Encounter (Signed)
Please call patient and schedule annual exam so we can refill her medication

## 2014-08-04 NOTE — Telephone Encounter (Signed)
Still no annual scheduled. Please contact patient for appointment so we can refill her medication.   Please advise on any further refills until appointments scheduled.

## 2014-08-05 ENCOUNTER — Other Ambulatory Visit: Payer: Self-pay | Admitting: *Deleted

## 2014-08-05 MED ORDER — ESTROGENS CONJUGATED 0.625 MG PO TABS: 0.6250 mg | ORAL_TABLET | Freq: Every day | ORAL | Status: DC

## 2014-08-05 NOTE — Telephone Encounter (Signed)
Annual scheduled and refill sent to pharmacy. Patient notified of refill.

## 2014-08-06 ENCOUNTER — Encounter (HOSPITAL_COMMUNITY): Payer: Self-pay | Admitting: *Deleted

## 2014-08-06 ENCOUNTER — Emergency Department (HOSPITAL_COMMUNITY)
Admission: EM | Admit: 2014-08-06 | Discharge: 2014-08-06 | Disposition: A | Discharge status: Home / Self Care | Payer: BC Managed Care – PPO | Source: Home / Self Care | Attending: Family Medicine | Admitting: Family Medicine

## 2014-08-06 DIAGNOSIS — J40 Bronchitis, not specified as acute or chronic: Secondary | ICD-10-CM

## 2014-08-06 DIAGNOSIS — J069 Acute upper respiratory infection, unspecified: Secondary | ICD-10-CM

## 2014-08-06 MED ORDER — AZITHROMYCIN 250 MG PO TABS: ORAL_TABLET | ORAL | Status: DC

## 2014-08-06 MED ORDER — ALBUTEROL SULFATE HFA 108 (90 BASE) MCG/ACT IN AERS: 1.0000 | INHALATION_SPRAY | RESPIRATORY_TRACT | Status: DC | PRN

## 2014-08-06 MED ORDER — HYDROCOD POLST-CHLORPHEN POLST 10-8 MG/5ML PO LQCR: 5.0000 mL | Freq: Every evening | ORAL | Status: DC | PRN

## 2014-08-06 NOTE — ED Provider Notes (Signed)
CSN: [id number]     Arrival date & time 08/06/14  1452 History   First MD Initiated Contact with Patient 08/06/14 1523     Chief Complaint  Patient presents with  . URI   HPI: Patient is a 53 y.o. female presenting with URI. The history is provided by the patient.  URI Presenting symptoms: congestion, cough and sore throat   Presenting symptoms: no ear pain, no facial pain, no fatigue and no fever   Severity:  Moderate Onset quality:  Gradual Duration:  5 days Timing:  Constant Progression:  Worsening Chronicity:  New Relieved by:  Nothing Worsened by:  Certain positions Ineffective treatments:  Decongestant and OTC medications Associated symptoms: headaches   Associated symptoms: no arthralgias, no myalgias, no neck pain, no sinus pain and no sneezing   Risk factors: chronic kidney disease   Risk factors: no chronic cardiac disease, no chronic respiratory disease, no diabetes mellitus, no recent illness, no recent travel and no sick contacts   URI symptoms x 5 days. Predominate symptom is unproductive, spastic type cough. Refractory to Mucinex and OTC remedies. Worse at night and lying down. At times sees specs of blood in clear secretions. Pt is non-smoker and works as a Engineer, drilling. Mild seasonal allergy history for which she uses Allegra PRN.   Past Medical History  Diagnosis Date  . Fibromyalgia     PT ON LYRIC   . Hypertension     HX OF ELEVATED B/P FOR SEVERAL YRS--NO MEDS UNTIL LAST WEEK--PT WAS C/O OF CHEST PRESSURE AND B/P ELEVATED--PT SAW NURSE PRACTIONER ON 08/21/11 AND STATES EKG WAS DONE--TOLD ABNORMAL--PUT ON B/P MEDICATION LISINOPRIL AND TOLD SHE WAS TO SEE A CARDIOLOGIST.  [name] Shortness of breath     WITH EXERTION--NEW FOR PT--PT ALSO REPORTS FEELING TIRED  . GERD (gastroesophageal reflux disease)     NOT ON ANY MEDS AT PRESENT  . Headache(784.0)     HX OF MIGRAINES  . Anxiety     HX OF ANXIETY ATTACKS  . Back pain, chronic    Past Surgical History   Procedure Laterality Date  . Tubal ligation    . Constipation    . Robotic assisted lap vaginal hysterectomy  09/14/2011    Procedure: ROBOTIC ASSISTED LAPAROSCOPIC VAGINAL HYSTERECTOMY;  Surgeon: Agnes Lawrence, MD;  Location: [facility];  Service: Gynecology;  Laterality: N/A;  . Cystoscopy w/ ureteral stent placement  09/23/2012    Procedure: CYSTOSCOPY WITH RETROGRADE PYELOGRAM/URETERAL STENT PLACEMENT;  Surgeon: Alexis Frock, MD;  Location: [facility];  Service: Urology;  Laterality: Right;  . Cystoscopy with retrograde pyelogram, ureteroscopy and stent placement  10/08/2012    Procedure: CYSTOSCOPY WITH RETROGRADE PYELOGRAM, URETEROSCOPY AND STENT PLACEMENT;  Surgeon: Alexis Frock, MD;  Location: [facility];  Service: Urology;  Laterality: Right;  90 MIN NO RETROGRADE NEEDS FLEX URETEROSCOPE   . Holmium laser application  0/24/0973    Procedure: HOLMIUM LASER APPLICATION;  Surgeon: Alexis Frock, MD;  Location: [facility];  Service: Urology;  Laterality: Right;   History reviewed. No pertinent family history. History  Substance Use Topics  . Smoking status: Never Smoker   . Smokeless tobacco: Never Used  . Alcohol Use: No   OB History    No data available     Review of Systems  Constitutional: Negative for fever, chills and fatigue.  HENT: Positive for congestion, postnasal drip and sore throat. Negative for ear discharge, ear pain, facial swelling, sneezing and trouble  swallowing.   Eyes: Negative for pain and discharge.  Respiratory: Positive for cough. Negative for shortness of breath and stridor.   Cardiovascular: Negative for chest pain and palpitations.  Gastrointestinal: Positive for nausea. Negative for vomiting, abdominal pain, diarrhea and constipation.  Musculoskeletal: Negative for myalgias, arthralgias and neck pain.  Neurological: Positive for headaches.    Allergies  Review of patient's allergies indicates no known  allergies.  Home Medications   Prior to Admission medications   Medication Sig Start Date End Date Taking? Authorizing Provider  albuterol (PROVENTIL HFA;VENTOLIN HFA) 108 (90 BASE) MCG/ACT inhaler Inhale 1-2 puffs into the lungs every 4 (four) hours as needed for wheezing or shortness of breath (and or cough). 08/06/14   Rhetta Mura Margherita Collyer, NP  azithromycin (ZITHROMAX Z-PAK) 250 MG tablet Take 2 tabs on day 1 and then 1 tab on days 2-5 08/06/14   Rhetta Mura Caelen Higinbotham, NP  chlorpheniramine-HYDROcodone (TUSSIONEX PENNKINETIC ER) 10-8 MG/5ML LQCR Take 5 mLs by mouth at bedtime as needed for cough. 08/06/14   Rhetta Mura Avier Jech, NP  cyclobenzaprine (FLEXERIL) 10 MG tablet Take 10 mg by mouth 3 (three) times daily as needed for muscle spasms.    Historical Provider, MD  DULoxetine (CYMBALTA) 60 MG capsule Take 60 mg by mouth daily.    Historical Provider, MD  estrogens, conjugated, (PREMARIN) 0.625 MG tablet Take 1 tablet (0.625 mg total) by mouth daily. 08/05/14   Lahoma Crocker, MD  HYDROcodone-acetaminophen (NORCO/VICODIN) 5-325 MG per tablet Take 2 tablets by mouth every 4 (four) hours as needed. 10/27/13   Johnna Acosta, MD  ibuprofen (ADVIL,MOTRIN) 200 MG tablet Take 400 mg by mouth every 6 (six) hours as needed (pain).    Historical Provider, MD  lisinopril-hydrochlorothiazide (PRINZIDE,ZESTORETIC) 10-12.5 MG per tablet Take 1 tablet by mouth daily.    Historical Provider, MD  naproxen (NAPROSYN) 500 MG tablet Take 1 tablet (500 mg total) by mouth 2 (two) times daily with a meal. 10/27/13   Johnna Acosta, MD  ondansetron (ZOFRAN ODT) 4 MG disintegrating tablet Take 1 tablet (4 mg total) by mouth every 8 (eight) hours as needed for nausea. 10/27/13   Johnna Acosta, MD  promethazine (PHENERGAN) 25 MG tablet Take 1 tablet (25 mg total) by mouth every 6 (six) hours as needed for nausea or vomiting. 10/27/13   Johnna Acosta, MD   BP 156/76 mmHg  Pulse 91  Temp(Src) 98.5 F (36.9 C) (Oral)   Resp 20  SpO2 95%  LMP 08/04/2011 Physical Exam  Constitutional: She is oriented to person, place, and time. She appears well-developed and well-nourished.  HENT:  Head: Normocephalic and atraumatic.  Right Ear: Tympanic membrane, external ear and ear canal normal.  Left Ear: Tympanic membrane, external ear and ear canal normal.  Nose: Mucosal edema present. Right sinus exhibits no maxillary sinus tenderness and no frontal sinus tenderness. Left sinus exhibits no maxillary sinus tenderness and no frontal sinus tenderness.  Mouth/Throat: Uvula is midline and mucous membranes are normal. No posterior oropharyngeal erythema.  Pharyngeal obblinstoning   Eyes: Conjunctivae are normal.  Neck: Neck supple.  Cardiovascular: Normal rate and regular rhythm.   Pulmonary/Chest: Effort normal and breath sounds normal.  Neurological: She is alert and oriented to person, place, and time.  Skin: Skin is warm and dry.  Psychiatric: She has a normal mood and affect.    ED Course  Procedures (including critical care time) Labs Review Labs Reviewed - No data to display  Imaging Review  No results found.   MDM   1. Bronchitis   2. URI (upper respiratory infection)    5 day h/o persistent, frequent protracted cough associated w/ URI.  Z-pack, Tussionex and Albuterol HFA. F/u PCP if no improvement over next 2-3 days.     Jeryl Columbia, NP 08/06/14 1609

## 2014-08-06 NOTE — Discharge Instructions (Signed)
How to Use an Inhaler Proper inhaler technique is very important. Good technique ensures that the medicine reaches the lungs. Poor technique results in depositing the medicine on the tongue and back of the throat rather than in the airways. If you do not use the inhaler with good technique, the medicine will not help you. STEPS TO FOLLOW IF USING AN INHALER WITHOUT AN EXTENSION TUBE  Remove the cap from the inhaler.  If you are using the inhaler for the first time, you will need to prime it. Shake the inhaler for 5 seconds and release four puffs into the air, away from your face. Ask your health care provider or pharmacist if you have questions about priming your inhaler.  Shake the inhaler for 5 seconds before each breath in (inhalation).  Position the inhaler so that the top of the canister faces up.  Put your index finger on the top of the medicine canister. Your thumb supports the bottom of the inhaler.  Open your mouth.  Either place the inhaler between your teeth and place your lips tightly around the mouthpiece, or hold the inhaler 1-2 inches away from your open mouth. If you are unsure of which technique to use, ask your health care provider.  Breathe out (exhale) normally and as completely as possible.  Press the canister down with your index finger to release the medicine.  At the same time as the canister is pressed, inhale deeply and slowly until your lungs are completely filled. This should take 4-6 seconds. Keep your tongue down.  Hold the medicine in your lungs for 5-10 seconds (10 seconds is best). This helps the medicine get into the small airways of your lungs.  Breathe out slowly, through pursed lips. Whistling is an example of pursed lips.  Wait at least 15-30 seconds between puffs. Continue with the above steps until you have taken the number of puffs your health care provider has ordered. Do not use the inhaler more than your health care provider tells  you.  Replace the cap on the inhaler.  Follow the directions from your health care provider or the inhaler insert for cleaning the inhaler. STEPS TO FOLLOW IF USING AN INHALER WITH AN EXTENSION (SPACER)  Remove the cap from the inhaler.  If you are using the inhaler for the first time, you will need to prime it. Shake the inhaler for 5 seconds and release four puffs into the air, away from your face. Ask your health care provider or pharmacist if you have questions about priming your inhaler.  Shake the inhaler for 5 seconds before each breath in (inhalation).  Place the open end of the spacer onto the mouthpiece of the inhaler.  Position the inhaler so that the top of the canister faces up and the spacer mouthpiece faces you.  Put your index finger on the top of the medicine canister. Your thumb supports the bottom of the inhaler and the spacer.  Breathe out (exhale) normally and as completely as possible.  Immediately after exhaling, place the spacer between your teeth and into your mouth. Close your lips tightly around the spacer.  Press the canister down with your index finger to release the medicine.  At the same time as the canister is pressed, inhale deeply and slowly until your lungs are completely filled. This should take 4-6 seconds. Keep your tongue down and out of the way.  Hold the medicine in your lungs for 5-10 seconds (10 seconds is best). This helps the  medicine get into the small airways of your lungs. Exhale.  Repeat inhaling deeply through the spacer mouthpiece. Again hold that breath for up to 10 seconds (10 seconds is best). Exhale slowly. If it is difficult to take this second deep breath through the spacer, breathe normally several times through the spacer. Remove the spacer from your mouth.  Wait at least 15-30 seconds between puffs. Continue with the above steps until you have taken the number of puffs your health care provider has ordered. Do not use the  inhaler more than your health care provider tells you.  Remove the spacer from the inhaler, and place the cap on the inhaler.  Follow the directions from your health care provider or the inhaler insert for cleaning the inhaler and spacer. If you are using different kinds of inhalers, use your quick relief medicine to open the airways 10-15 minutes before using a steroid if instructed to do so by your health care provider. If you are unsure which inhalers to use and the order of using them, ask your health care provider, nurse, or respiratory therapist. If you are using a steroid inhaler, always rinse your mouth with water after your last puff, then gargle and spit out the water. Do not swallow the water. AVOID:  Inhaling before or after starting the spray of medicine. It takes practice to coordinate your breathing with triggering the spray.  Inhaling through the nose (rather than the mouth) when triggering the spray. HOW TO DETERMINE IF YOUR INHALER IS FULL OR NEARLY EMPTY You cannot know when an inhaler is empty by shaking it. A few inhalers are now being made with dose counters. Ask your health care provider for a prescription that has a dose counter if you feel you need that extra help. If your inhaler does not have a counter, ask your health care provider to help you determine the date you need to refill your inhaler. Write the refill date on a calendar or your inhaler canister. Refill your inhaler 7-10 days before it runs out. Be sure to keep an adequate supply of medicine. This includes making sure it is not expired, and that you have a spare inhaler.  SEEK MEDICAL CARE IF:   Your symptoms are only partially relieved with your inhaler.  You are having trouble using your inhaler.  You have some increase in phlegm. SEEK IMMEDIATE MEDICAL CARE IF:   You feel little or no relief with your inhalers. You are still wheezing and are feeling shortness of breath or tightness in your chest or  both.  You have dizziness, headaches, or a fast heart rate.  You have chills, fever, or night sweats.  You have a noticeable increase in phlegm production, or there is blood in the phlegm. MAKE SURE YOU:   Understand these instructions.  Will watch your condition.  Will get help right away if you are not doing well or get worse. Document Released: 08/31/2000 Document Revised: 06/24/2013 Document Reviewed: 04/02/2013 [facility] Patient Information 2015 [place], [place]. This information is not intended to replace advice given to you by your health care provider. Make sure you discuss any questions you have with your health care provider.  Upper Respiratory Infection, Adult An upper respiratory infection (URI) is also sometimes known as the common cold. The upper respiratory tract includes the nose, sinuses, throat, trachea, and bronchi. Bronchi are the airways leading to the lungs. Most people improve within 1 week, but symptoms can last up to 2 weeks. A residual  cough may last even longer.  CAUSES Many different viruses can infect the tissues lining the upper respiratory tract. The tissues become irritated and inflamed and often become very moist. Mucus production is also common. A cold is contagious. You can easily spread the virus to others by oral contact. This includes kissing, sharing a glass, coughing, or sneezing. Touching your mouth or nose and then touching a surface, which is then touched by another person, can also spread the virus. SYMPTOMS  Symptoms typically develop 1 to 3 days after you come in contact with a cold virus. Symptoms vary from person to person. They may include:  Runny nose.  Sneezing.  Nasal congestion.  Sinus irritation.  Sore throat.  Loss of voice (laryngitis).  Cough.  Fatigue.  Muscle aches.  Loss of appetite.  Headache.  Low-grade fever. DIAGNOSIS  You might diagnose your own cold based on familiar symptoms, since most people get a cold  2 to 3 times a year. Your caregiver can confirm this based on your exam. Most importantly, your caregiver can check that your symptoms are not due to another disease such as strep throat, sinusitis, pneumonia, asthma, or epiglottitis. Blood tests, throat tests, and X-rays are not necessary to diagnose a common cold, but they may sometimes be helpful in excluding other more serious diseases. Your caregiver will decide if any further tests are required. RISKS AND COMPLICATIONS  You may be at risk for a more severe case of the common cold if you smoke cigarettes, have chronic heart disease (such as heart failure) or lung disease (such as asthma), or if you have a weakened immune system. The very young and very old are also at risk for more serious infections. Bacterial sinusitis, middle ear infections, and bacterial pneumonia can complicate the common cold. The common cold can worsen asthma and chronic obstructive pulmonary disease (COPD). Sometimes, these complications can require emergency medical care and may be life-threatening. PREVENTION  The best way to protect against getting a cold is to practice good hygiene. Avoid oral or hand contact with people with cold symptoms. Wash your hands often if contact occurs. There is no clear evidence that vitamin C, vitamin E, echinacea, or exercise reduces the chance of developing a cold. However, it is always recommended to get plenty of rest and practice good nutrition. TREATMENT  Treatment is directed at relieving symptoms. There is no cure. Antibiotics are not effective, because the infection is caused by a virus, not by bacteria. Treatment may include:  Increased fluid intake. Sports drinks offer valuable electrolytes, sugars, and fluids.  Breathing heated mist or steam (vaporizer or shower).  Eating chicken soup or other clear broths, and maintaining good nutrition.  Getting plenty of rest.  Using gargles or lozenges for comfort.  Controlling fevers  with ibuprofen or acetaminophen as directed by your caregiver.  Increasing usage of your inhaler if you have asthma. Zinc gel and zinc lozenges, taken in the first 24 hours of the common cold, can shorten the duration and lessen the severity of symptoms. Pain medicines may help with fever, muscle aches, and throat pain. A variety of non-prescription medicines are available to treat congestion and runny nose. Your caregiver can make recommendations and may suggest nasal or lung inhalers for other symptoms.  HOME CARE INSTRUCTIONS   Only take over-the-counter or prescription medicines for pain, discomfort, or fever as directed by your caregiver.  Use a warm mist humidifier or inhale steam from a shower to increase air moisture. This  may keep secretions moist and make it easier to breathe.  Drink enough water and fluids to keep your urine clear or pale yellow.  Rest as needed.  Return to work when your temperature has returned to normal or as your caregiver advises. You may need to stay home longer to avoid infecting others. You can also use a face mask and careful hand washing to prevent spread of the virus. SEEK MEDICAL CARE IF:   After the first few days, you feel you are getting worse rather than better.  You need your caregiver's advice about medicines to control symptoms.  You develop chills, worsening shortness of breath, or brown or red sputum. These may be signs of pneumonia.  You develop yellow or brown nasal discharge or pain in the face, especially when you bend forward. These may be signs of sinusitis.  You develop a fever, swollen neck glands, pain with swallowing, or white areas in the back of your throat. These may be signs of strep throat. SEEK IMMEDIATE MEDICAL CARE IF:   You have a fever.  You develop severe or persistent headache, ear pain, sinus pain, or chest pain.  You develop wheezing, a prolonged cough, cough up blood, or have a change in your usual mucus (if you  have chronic lung disease).  You develop sore muscles or a stiff neck. Document Released: 02/27/2001 Document Revised: 11/26/2011 Document Reviewed: 12/09/2013 [facility] Patient Information 2015 [place], [place]. This information is not intended to replace advice given to you by your health care provider. Make sure you discuss any questions you have with your health care provider.  Cough, Adult  A cough is a reflex. It helps you clear your throat and airways. A cough can help heal your body. A cough can last 2 or 3 weeks (acute) or may last more than 8 weeks (chronic). Some common causes of a cough can include an infection, allergy, or a cold. HOME CARE  Only take medicine as told by your doctor.  If given, take your medicines (antibiotics) as told. Finish them even if you start to feel better.  Use a cold steam vaporizer or humidifier in your home. This can help loosen thick spit (secretions).  Sleep so you are almost sitting up (semi-upright). Use pillows to do this. This helps reduce coughing.  Rest as needed.  Stop smoking if you smoke. GET HELP RIGHT AWAY IF:  You have yellowish-white fluid (pus) in your thick spit.  Your cough gets worse.  Your medicine does not reduce coughing, and you are losing sleep.  You cough up blood.  You have trouble breathing.  Your pain gets worse and medicine does not help.  You have a fever. MAKE SURE YOU:   Understand these instructions.  Will watch your condition.  Will get help right away if you are not doing well or get worse. Document Released: 05/17/2011 Document Revised: 01/18/2014 Document Reviewed: 05/17/2011 [facility] Patient Information 2015 [place], [place]. This information is not intended to replace advice given to you by your health care provider. Make sure you discuss any questions you have with your health care provider.

## 2014-08-06 NOTE — ED Notes (Signed)
Pt  Reports   Symptoms of  Cough   Congestion     - with  Nasal  stuffyness      With  Blood in phlegm       Symptoms  X  4- 5  Days          Pt  Sitting  Upright on the  Exam table speaking in  Complete  sentances  And in no severe  Distress

## 2014-08-10 ENCOUNTER — Emergency Department (INDEPENDENT_AMBULATORY_CARE_PROVIDER_SITE_OTHER)
Admission: EM | Admit: 2014-08-10 | Discharge: 2014-08-10 | Disposition: A | Discharge status: Home / Self Care | Payer: BC Managed Care – PPO | Source: Home / Self Care | Attending: Family Medicine | Admitting: Family Medicine

## 2014-08-10 ENCOUNTER — Other Ambulatory Visit: Payer: Self-pay

## 2014-08-10 ENCOUNTER — Encounter (HOSPITAL_COMMUNITY): Payer: Self-pay | Admitting: Emergency Medicine

## 2014-08-10 ENCOUNTER — Ambulatory Visit (HOSPITAL_COMMUNITY): Payer: BC Managed Care – PPO | Attending: Family Medicine

## 2014-08-10 DIAGNOSIS — J189 Pneumonia, unspecified organism: Secondary | ICD-10-CM

## 2014-08-10 DIAGNOSIS — R05 Cough: Secondary | ICD-10-CM | POA: Diagnosis present

## 2014-08-10 DIAGNOSIS — J40 Bronchitis, not specified as acute or chronic: Secondary | ICD-10-CM

## 2014-08-10 MED ORDER — PREDNISONE 50 MG PO TABS: 50.0000 mg | ORAL_TABLET | Freq: Every day | ORAL | Status: DC

## 2014-08-10 MED ORDER — IPRATROPIUM-ALBUTEROL 0.5-2.5 (3) MG/3ML IN SOLN
RESPIRATORY_TRACT | Status: AC
  Filled 2014-08-10: qty 3

## 2014-08-10 MED ORDER — CEFDINIR 300 MG PO CAPS: 300.0000 mg | ORAL_CAPSULE | Freq: Two times a day (BID) | ORAL | Status: DC

## 2014-08-10 MED ORDER — IPRATROPIUM-ALBUTEROL 0.5-2.5 (3) MG/3ML IN SOLN
3.0000 mL | Freq: Once | RESPIRATORY_TRACT | Status: AC
  Administered 2014-08-10: 3 mL via RESPIRATORY_TRACT

## 2014-08-10 NOTE — Discharge Instructions (Signed)
Thank you for coming in today. °Call or go to the emergency room if you get worse, have trouble breathing, have chest pains, or palpitations.  ° °Acute Bronchitis °Bronchitis is inflammation of the airways that extend from the windpipe into the lungs (bronchi). The inflammation often causes mucus to develop. This leads to a cough, which is the most common symptom of bronchitis.  °In acute bronchitis, the condition usually develops suddenly and goes away over time, usually in a couple weeks. Smoking, allergies, and asthma can make bronchitis worse. Repeated episodes of bronchitis may cause further lung problems.  °CAUSES °Acute bronchitis is most often caused by the same virus that causes a cold. The virus can spread from person to person (contagious) through coughing, sneezing, and touching contaminated objects. °SIGNS AND SYMPTOMS  °· Cough.   °· Fever.   °· Coughing up mucus.   °· Body aches.   °· Chest congestion.   °· Chills.   °· Shortness of breath.   °· Sore throat.   °DIAGNOSIS  °Acute bronchitis is usually diagnosed through a physical exam. Your health care provider will also ask you questions about your medical history. Tests, such as chest X-rays, are sometimes done to rule out other conditions.  °TREATMENT  °Acute bronchitis usually goes away in a couple weeks. Oftentimes, no medical treatment is necessary. Medicines are sometimes given for relief of fever or cough. Antibiotic medicines are usually not needed but may be prescribed in certain situations. In some cases, an inhaler may be recommended to help reduce shortness of breath and control the cough. A cool mist vaporizer may also be used to help thin bronchial secretions and make it easier to clear the chest.  °HOME CARE INSTRUCTIONS °· Get plenty of rest.   °· Drink enough fluids to keep your urine clear or pale yellow (unless you have a medical condition that requires fluid restriction). Increasing fluids may help thin your respiratory secretions  (sputum) and reduce chest congestion, and it will prevent dehydration.   °· Take medicines only as directed by your health care provider. °· If you were prescribed an antibiotic medicine, finish it all even if you start to feel better. °· Avoid smoking and secondhand smoke. Exposure to cigarette smoke or irritating chemicals will make bronchitis worse. If you are a smoker, consider using nicotine gum or skin patches to help control withdrawal symptoms. Quitting smoking will help your lungs heal faster.   °· Reduce the chances of another bout of acute bronchitis by washing your hands frequently, avoiding people with cold symptoms, and trying not to touch your hands to your mouth, nose, or eyes.   °· Keep all follow-up visits as directed by your health care provider.   °SEEK MEDICAL CARE IF: °Your symptoms do not improve after 1 week of treatment.  °SEEK IMMEDIATE MEDICAL CARE IF: °· You develop an increased fever or chills.   °· You have chest pain.   °· You have severe shortness of breath. °· You have bloody sputum.   °· You develop dehydration. °· You faint or repeatedly feel like you are going to pass out. °· You develop repeated vomiting. °· You develop a severe headache. °MAKE SURE YOU:  °· Understand these instructions. °· Will watch your condition. °· Will get help right away if you are not doing well or get worse. °Document Released: 10/11/2004 Document Revised: 01/18/2014 Document Reviewed: 02/24/2013 °[employer]® Patient Information ©2015 [employer]. This information is not intended to replace advice given to you by your health care provider. Make sure you discuss any questions you have with your   health care provider.   Pneumonia Pneumonia is an infection of the lungs.  CAUSES Pneumonia may be caused by bacteria or a virus. Usually, these infections are caused by breathing infectious particles into the lungs (respiratory tract). SIGNS AND SYMPTOMS   Cough.  Fever.  Chest pain.  Increased  rate of breathing.  Wheezing.  Mucus production. DIAGNOSIS  If you have the common symptoms of pneumonia, your health care provider will typically confirm the diagnosis with a chest X-ray. The X-ray will show an abnormality in the lung (pulmonary infiltrate) if you have pneumonia. Other tests of your blood, urine, or sputum may be done to find the specific cause of your pneumonia. Your health care provider may also do tests (blood gases or pulse oximetry) to see how well your lungs are working. TREATMENT  Some forms of pneumonia may be spread to other people when you cough or sneeze. You may be asked to wear a mask before and during your exam. Pneumonia that is caused by bacteria is treated with antibiotic medicine. Pneumonia that is caused by the influenza virus may be treated with an antiviral medicine. Most other viral infections must run their course. These infections will not respond to antibiotics.  HOME CARE INSTRUCTIONS   Cough suppressants may be used if you are losing too much rest. However, coughing protects you by clearing your lungs. You should avoid using cough suppressants if you can.  Your health care provider may have prescribed medicine if he or she thinks your pneumonia is caused by bacteria or influenza. Finish your medicine even if you start to feel better.  Your health care provider may also prescribe an expectorant. This loosens the mucus to be coughed up.  Take medicines only as directed by your health care provider.  Do not smoke. Smoking is a common cause of bronchitis and can contribute to pneumonia. If you are a smoker and continue to smoke, your cough may last several weeks after your pneumonia has cleared.  A cold steam vaporizer or humidifier in your room or home may help loosen mucus.  Coughing is often worse at night. Sleeping in a semi-upright position in a recliner or using a couple pillows under your head will help with this.  Get rest as you feel it is  needed. Your body will usually let you know when you need to rest. PREVENTION A pneumococcal shot (vaccine) is available to prevent a common bacterial cause of pneumonia. This is usually suggested for:  People over 48 years old.  Patients on chemotherapy.  People with chronic lung problems, such as bronchitis or emphysema.  People with immune system problems. If you are over 65 or have a high risk condition, you may receive the pneumococcal vaccine if you have not received it before. In some countries, a routine influenza vaccine is also recommended. This vaccine can help prevent some cases of pneumonia.You may be offered the influenza vaccine as part of your care. If you smoke, it is time to quit. You may receive instructions on how to stop smoking. Your health care provider can provide medicines and counseling to help you quit. SEEK MEDICAL CARE IF: You have a fever. SEEK IMMEDIATE MEDICAL CARE IF:   Your illness becomes worse. This is especially true if you are elderly or weakened from any other disease.  You cannot control your cough with suppressants and are losing sleep.  You begin coughing up blood.  You develop pain which is getting worse or is uncontrolled with  medicines.  Any of the symptoms which initially brought you in for treatment are getting worse rather than better.  You develop shortness of breath or chest pain. MAKE SURE YOU:   Understand these instructions.  Will watch your condition.  Will get help right away if you are not doing well or get worse. Document Released: 09/03/2005 Document Revised: 01/18/2014 Document Reviewed: 11/23/2010 [facility] Patient Information 2015 [place], [place]. This information is not intended to replace advice given to you by your health care provider. Make sure you discuss any questions you have with your health care provider.

## 2014-08-10 NOTE — ED Provider Notes (Signed)
Lisa Casey is a 53 y.o. female who presents to Urgent Care today for cough congestion wheezing. Patient has chest pain with cough. She notes nasal congestion. She has some wheezing. She was seen 4 days prior with similar symptoms. She was given Z-Pak Tussionex and albuterol    Past Medical History  Diagnosis Date  . Fibromyalgia     PT ON LYRIC   . Hypertension     HX OF ELEVATED B/P FOR SEVERAL YRS--NO MEDS UNTIL LAST WEEK--PT WAS C/O OF CHEST PRESSURE AND B/P ELEVATED--PT SAW NURSE PRACTIONER ON 08/21/11 AND STATES EKG WAS DONE--TOLD ABNORMAL--PUT ON B/P MEDICATION LISINOPRIL AND TOLD SHE WAS TO SEE A CARDIOLOGIST.  [name] Shortness of breath     WITH EXERTION--NEW FOR PT--PT ALSO REPORTS FEELING TIRED  . GERD (gastroesophageal reflux disease)     NOT ON ANY MEDS AT PRESENT  . Headache(784.0)     HX OF MIGRAINES  . Anxiety     HX OF ANXIETY ATTACKS  . Back pain, chronic    Past Surgical History  Procedure Laterality Date  . Tubal ligation    . Constipation    . Robotic assisted lap vaginal hysterectomy  09/14/2011    Procedure: ROBOTIC ASSISTED LAPAROSCOPIC VAGINAL HYSTERECTOMY;  Surgeon: Agnes Lawrence, MD;  Location: [facility];  Service: Gynecology;  Laterality: N/A;  . Cystoscopy w/ ureteral stent placement  09/23/2012    Procedure: CYSTOSCOPY WITH RETROGRADE PYELOGRAM/URETERAL STENT PLACEMENT;  Surgeon: Alexis Frock, MD;  Location: [facility];  Service: Urology;  Laterality: Right;  . Cystoscopy with retrograde pyelogram, ureteroscopy and stent placement  10/08/2012    Procedure: CYSTOSCOPY WITH RETROGRADE PYELOGRAM, URETEROSCOPY AND STENT PLACEMENT;  Surgeon: Alexis Frock, MD;  Location: [facility];  Service: Urology;  Laterality: Right;  90 MIN NO RETROGRADE NEEDS FLEX URETEROSCOPE   . Holmium laser application  1/54/0086    Procedure: HOLMIUM LASER APPLICATION;  Surgeon: Alexis Frock, MD;  Location: [facility];  Service: Urology;   Laterality: Right;   History  Substance Use Topics  . Smoking status: Never Smoker   . Smokeless tobacco: Never Used  . Alcohol Use: No   ROS as above Medications: No current facility-administered medications for this encounter.   Current Outpatient Prescriptions  Medication Sig Dispense Refill  . albuterol (PROVENTIL HFA;VENTOLIN HFA) 108 (90 BASE) MCG/ACT inhaler Inhale 1-2 puffs into the lungs every 4 (four) hours as needed for wheezing or shortness of breath (and or cough). 1 Inhaler 0  . azithromycin (ZITHROMAX Z-PAK) 250 MG tablet Take 2 tabs on day 1 and then 1 tab on days 2-5 6 tablet 0  . cefdinir (OMNICEF) 300 MG capsule Take 1 capsule (300 mg total) by mouth 2 (two) times daily. 14 capsule 0  . chlorpheniramine-HYDROcodone (TUSSIONEX PENNKINETIC ER) 10-8 MG/5ML LQCR Take 5 mLs by mouth at bedtime as needed for cough. 50 mL 0  . cyclobenzaprine (FLEXERIL) 10 MG tablet Take 10 mg by mouth 3 (three) times daily as needed for muscle spasms.    . DULoxetine (CYMBALTA) 60 MG capsule Take 60 mg by mouth daily.    [name] estrogens, conjugated, (PREMARIN) 0.625 MG tablet Take 1 tablet (0.625 mg total) by mouth daily. 90 tablet 0  . HYDROcodone-acetaminophen (NORCO/VICODIN) 5-325 MG per tablet Take 2 tablets by mouth every 4 (four) hours as needed. 6 tablet 0  . ibuprofen (ADVIL,MOTRIN) 200 MG tablet Take 400 mg by mouth every 6 (six) hours as needed (pain).    [name]  lisinopril-hydrochlorothiazide (PRINZIDE,ZESTORETIC) 10-12.5 MG per tablet Take 1 tablet by mouth daily.    . naproxen (NAPROSYN) 500 MG tablet Take 1 tablet (500 mg total) by mouth 2 (two) times daily with a meal. 30 tablet 0  . ondansetron (ZOFRAN ODT) 4 MG disintegrating tablet Take 1 tablet (4 mg total) by mouth every 8 (eight) hours as needed for nausea. 10 tablet 0  . predniSONE (DELTASONE) 50 MG tablet Take 1 tablet (50 mg total) by mouth daily. 5 tablet 0  . promethazine (PHENERGAN) 25 MG tablet Take 1 tablet (25 mg total) by  mouth every 6 (six) hours as needed for nausea or vomiting. 12 tablet 0   No Known Allergies   Exam:  BP 149/87 mmHg  Pulse 76  Temp(Src) 97.7 F (36.5 C) (Oral)  Resp 16  SpO2 98%  LMP 08/04/2011 Gen: Well NAD HEENT: EOMI,  MMM Normal posterior pharynx and tympanic membranes  Lungs: Normal work of breathing. Coarse breath sounds and wheezing. Heart: RRR no MRG Abd: NABS, Soft. Nondistended, Nontender Exts: Brisk capillary refill, warm and well perfused.   Patient was given a 2.5/0.5 mg DuoNeb nebulizer treatment, and felt better  Twelve-lead EKG shows normal sinus rhythm at 69 bpm. PR interval is short and 96 ms. ST segments is normal. Flattened lateral precordial T waves. No Q waves.  No results found for this or any previous visit (from the past 24 hour(s)). Dg Chest 2 View  08/10/2014   CLINICAL DATA:  Acute cough  EXAM: CHEST - 2 VIEW  COMPARISON:  02/27/2013  FINDINGS: Stable scoliosis of the thoracolumbar spine. Lower lung volumes with increased basilar atelectasis. Normal heart size and vascularity. No focal pneumonia, collapse or consolidation. Negative for edema, effusion or pneumothorax. Trachea midline.  IMPRESSION: Low volume exam with increased basilar atelectasis, worse on the left   Electronically Signed   By: Daryll Brod M.D.   On: 08/10/2014 13:50    Assessment and Plan: 53 y.o. female with bronchitis versus pneumonia. Atelectasis possible for community acquired pneumonia. Treat with Omnicef and prednisone. Follow-up as needed.  Discussed warning signs or symptoms. Please see discharge instructions. Patient expresses understanding.     Gregor Hams, MD 08/10/14 [phone]

## 2014-08-10 NOTE — ED Notes (Signed)
Patient went to [facility] for xray

## 2014-08-26 ENCOUNTER — Encounter (HOSPITAL_COMMUNITY): Payer: Self-pay | Admitting: Cardiology

## 2014-09-13 ENCOUNTER — Encounter: Payer: Self-pay | Admitting: *Deleted

## 2014-09-14 ENCOUNTER — Encounter: Payer: Self-pay | Admitting: Obstetrics & Gynecology

## 2014-10-06 ENCOUNTER — Other Ambulatory Visit: Payer: Self-pay | Admitting: *Deleted

## 2014-10-06 DIAGNOSIS — N951 Menopausal and female climacteric states: Secondary | ICD-10-CM

## 2014-10-06 MED ORDER — ESTROGENS CONJUGATED 0.625 MG PO TABS: 0.6250 mg | ORAL_TABLET | Freq: Every day | ORAL | Status: DC

## 2014-10-13 ENCOUNTER — Ambulatory Visit: Payer: Self-pay | Admitting: Obstetrics & Gynecology

## 2015-01-17 ENCOUNTER — Encounter (HOSPITAL_COMMUNITY): Payer: Self-pay | Admitting: Emergency Medicine

## 2015-01-17 ENCOUNTER — Emergency Department (HOSPITAL_COMMUNITY): Payer: Worker's Compensation

## 2015-01-17 ENCOUNTER — Emergency Department (HOSPITAL_COMMUNITY)
Admission: EM | Admit: 2015-01-17 | Discharge: 2015-01-17 | Disposition: A | Discharge status: Home / Self Care | Payer: Worker's Compensation | Attending: Emergency Medicine | Admitting: Emergency Medicine

## 2015-01-17 DIAGNOSIS — Y9389 Activity, other specified: Secondary | ICD-10-CM | POA: Diagnosis not present

## 2015-01-17 DIAGNOSIS — W010XXA Fall on same level from slipping, tripping and stumbling without subsequent striking against object, initial encounter: Secondary | ICD-10-CM | POA: Insufficient documentation

## 2015-01-17 DIAGNOSIS — S79912A Unspecified injury of left hip, initial encounter: Secondary | ICD-10-CM | POA: Diagnosis present

## 2015-01-17 DIAGNOSIS — Z7952 Long term (current) use of systemic steroids: Secondary | ICD-10-CM | POA: Insufficient documentation

## 2015-01-17 DIAGNOSIS — F419 Anxiety disorder, unspecified: Secondary | ICD-10-CM | POA: Insufficient documentation

## 2015-01-17 DIAGNOSIS — G8929 Other chronic pain: Secondary | ICD-10-CM | POA: Insufficient documentation

## 2015-01-17 DIAGNOSIS — Y9289 Other specified places as the place of occurrence of the external cause: Secondary | ICD-10-CM | POA: Diagnosis not present

## 2015-01-17 DIAGNOSIS — R52 Pain, unspecified: Secondary | ICD-10-CM

## 2015-01-17 DIAGNOSIS — M25552 Pain in left hip: Secondary | ICD-10-CM

## 2015-01-17 DIAGNOSIS — I10 Essential (primary) hypertension: Secondary | ICD-10-CM | POA: Insufficient documentation

## 2015-01-17 DIAGNOSIS — Z79899 Other long term (current) drug therapy: Secondary | ICD-10-CM | POA: Insufficient documentation

## 2015-01-17 DIAGNOSIS — Z8719 Personal history of other diseases of the digestive system: Secondary | ICD-10-CM | POA: Diagnosis not present

## 2015-01-17 DIAGNOSIS — Z9889 Other specified postprocedural states: Secondary | ICD-10-CM | POA: Diagnosis not present

## 2015-01-17 DIAGNOSIS — Y998 Other external cause status: Secondary | ICD-10-CM | POA: Insufficient documentation

## 2015-01-17 DIAGNOSIS — W19XXXA Unspecified fall, initial encounter: Secondary | ICD-10-CM

## 2015-01-17 MED ORDER — IBUPROFEN 600 MG PO TABS: 600.0000 mg | ORAL_TABLET | Freq: Four times a day (QID) | ORAL | Status: DC | PRN

## 2015-01-17 MED ORDER — OXYCODONE-ACETAMINOPHEN 5-325 MG PO TABS
2.0000 | ORAL_TABLET | Freq: Once | ORAL | Status: AC
  Administered 2015-01-17: 2 via ORAL
  Filled 2015-01-17: qty 2

## 2015-01-17 NOTE — ED Notes (Signed)
Bed: OY43 Expected date:  Expected time:  Means of arrival:  Comments: Ems FALL

## 2015-01-17 NOTE — ED Notes (Signed)
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 She landed on her left side and complains of pain to the left hip. She has limited range of motion due to pain.

## 2015-01-20 NOTE — ED Provider Notes (Signed)
CSN: [id number]     Arrival date & time 01/17/15  1816 History   First MD Initiated Contact with Patient 01/17/15 1827     Chief Complaint  Patient presents with  . Fall  . 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  She denies weakness or numbness in her legs.  No abdominal pain or chest pain.  No head injury.  No neck pain.  No weakness of her arms or legs.  She reports low back pain left hip pain only.  Is worse with palpation and range of motion.  Her pain is moderate in severity.  Nothing worsens or improves her symptoms   Past Medical History  Diagnosis Date  . Fibromyalgia     PT ON LYRIC   . Hypertension     HX OF ELEVATED B/P FOR SEVERAL YRS--NO MEDS UNTIL LAST WEEK--PT WAS C/O OF CHEST PRESSURE AND B/P ELEVATED--PT SAW NURSE PRACTIONER ON 08/21/11 AND STATES EKG WAS DONE--TOLD ABNORMAL--PUT ON B/P MEDICATION LISINOPRIL AND TOLD SHE WAS TO SEE A CARDIOLOGIST.  [name] Shortness of breath     WITH EXERTION--NEW FOR PT--PT ALSO REPORTS FEELING TIRED  . GERD (gastroesophageal reflux disease)     NOT ON ANY MEDS AT PRESENT  . Headache(784.0)     HX OF MIGRAINES  . Anxiety     HX OF ANXIETY ATTACKS  . Back pain, chronic    Past Surgical History  Procedure Laterality Date  . Tubal ligation    . Constipation    . Robotic assisted lap vaginal hysterectomy  09/14/2011    Procedure: ROBOTIC ASSISTED LAPAROSCOPIC VAGINAL HYSTERECTOMY;  Surgeon: Agnes Lawrence, MD;  Location: [facility];  Service: Gynecology;  Laterality: N/A;  . Cystoscopy w/ ureteral stent placement  09/23/2012    Procedure: CYSTOSCOPY WITH RETROGRADE PYELOGRAM/URETERAL STENT PLACEMENT;  Surgeon: Alexis Frock, MD;  Location: [facility];  Service: Urology;  Laterality: Right;  . Cystoscopy with retrograde pyelogram, ureteroscopy and stent placement  10/08/2012    Procedure: CYSTOSCOPY WITH  RETROGRADE PYELOGRAM, URETEROSCOPY AND STENT PLACEMENT;  Surgeon: Alexis Frock, MD;  Location: [facility];  Service: Urology;  Laterality: Right;  90 MIN NO RETROGRADE NEEDS FLEX URETEROSCOPE   . Holmium laser application  5/39/7673    Procedure: HOLMIUM LASER APPLICATION;  Surgeon: Alexis Frock, MD;  Location: [facility];  Service: Urology;  Laterality: Right;  . Left heart catheterization with coronary angiogram N/A 10/29/2011    Procedure: LEFT HEART CATHETERIZATION WITH CORONARY ANGIOGRAM;  Surgeon: Leonie Man, MD;  Location: [facility] CATH LAB;  Service: Cardiovascular;  Laterality: N/A;   History reviewed. No pertinent family history. History  Substance Use Topics  . Smoking status: Never Smoker   . Smokeless tobacco: Never Used  . Alcohol Use: No   OB History    No data available     Review of Systems  All other systems reviewed and are negative.     Allergies  Review of patient's allergies indicates no known allergies.  Home Medications   Prior to Admission medications   Medication Sig Start Date End Date Taking? Authorizing Provider  albuterol (PROVENTIL HFA;VENTOLIN HFA) 108 (90 BASE) MCG/ACT inhaler Inhale 1-2 puffs into the lungs every 4 (four) hours as needed for wheezing or shortness of breath (and or cough). 08/06/14  Yes Jeryl Columbia, NP  DULoxetine (CYMBALTA) 60 MG capsule Take 60 mg by mouth daily.   Yes Historical Provider, MD  estrogens, conjugated, (PREMARIN) 0.625 MG tablet Take 1 tablet (0.625 mg total) by mouth daily. 10/06/14  Yes Shelly Bombard, MD  lisinopril-hydrochlorothiazide (PRINZIDE,ZESTORETIC) 10-12.5 MG per tablet Take 1 tablet by mouth daily.   Yes Historical Provider, MD  predniSONE (DELTASONE) 50 MG tablet Take 1 tablet (50 mg total) by mouth daily. Patient taking differently: Take 50 mg by mouth daily as needed (for fibarmalaiga flare up).  08/10/14  Yes Gregor Hams, MD  pregabalin (LYRICA) 100 MG  capsule Take 100 mg by mouth 2 (two) times daily.   Yes Historical Provider, MD  ibuprofen (ADVIL,MOTRIN) 600 MG tablet Take 1 tablet (600 mg total) by mouth every 6 (six) hours as needed (pain). 01/17/15   Jola Schmidt, MD   BP 124/73 mmHg  Pulse 68  Temp(Src) 98.2 F (36.8 C) (Oral)  Resp 20  SpO2 97%  LMP 08/04/2011 Physical Exam  Constitutional: She is oriented to person, place, and time. She appears well-developed and well-nourished. No distress.  HENT:  Head: Normocephalic and atraumatic.  Eyes: EOM are normal.  Neck: Normal range of motion.  Cardiovascular: Normal rate, regular rhythm and normal heart sounds.   Pulmonary/Chest: Effort normal and breath sounds normal.  Abdominal: Soft. She exhibits no distension. There is no tenderness.  Musculoskeletal:  Full range of motion bilateral ankles and knees.  Mild pain with range of motion of left hip.  No obvious bruising noted.  Neurological: She is alert and oriented to person, place, and time.  Skin: Skin is warm and dry.  Psychiatric: She has a normal mood and affect. Judgment normal.  Nursing note and vitals reviewed.   ED Course  Procedures (including critical care time) Labs Review Results for orders placed or performed during the hospital encounter of 10/28/13  CBC with Differential  Result Value Ref Range   WBC 3.3 (L) 4.0 - 10.5 K/uL   RBC 4.45 3.87 - 5.11 MIL/uL   Hemoglobin 14.0 12.0 - 15.0 g/dL   HCT 41.2 36.0 - 46.0 %   MCV 92.6 78.0 - 100.0 fL   MCH 31.5 26.0 - 34.0 pg   MCHC 34.0 30.0 - 36.0 g/dL   RDW 12.1 11.5 - 15.5 %   Platelets 210 150 - 400 K/uL   Neutrophils Relative % 52 43 - 77 %   Neutro Abs 1.7 1.7 - 7.7 K/uL   Lymphocytes Relative 32 12 - 46 %   Lymphs Abs 1.1 0.7 - 4.0 K/uL   Monocytes Relative 15 (H) 3 - 12 %   Monocytes Absolute 0.5 0.1 - 1.0 K/uL   Eosinophils Relative 1 0 - 5 %   Eosinophils Absolute 0.0 0.0 - 0.7 K/uL   Basophils Relative 0 0 - 1 %   Basophils Absolute 0.0 0.0 - 0.1  K/uL  Comprehensive metabolic panel  Result Value Ref Range   Sodium 139 137 - 147 mEq/L   Potassium 3.6 (L) 3.7 - 5.3 mEq/L   Chloride 103 96 - 112 mEq/L   CO2 24 19 - 32 mEq/L   Glucose, Bld 99 70 - 99 mg/dL   BUN 14 6 - 23 mg/dL   Creatinine, Ser 0.83 0.50 - 1.10 mg/dL   Calcium 8.7 8.4 - 10.5 mg/dL   Total Protein 7.2 6.0 - 8.3 g/dL   Albumin 3.4 (L) 3.5 - 5.2 g/dL  AST 22 0 - 37 U/L   ALT 17 0 - 35 U/L   Alkaline Phosphatase 66 39 - 117 U/L   Total Bilirubin 0.6 0.3 - 1.2 mg/dL   GFR calc non Af Amer 79 (L) >90 mL/min   GFR calc Af Amer >90 >90 mL/min  Urinalysis, Routine w reflex microscopic  Result Value Ref Range   Color, Urine AMBER (A) YELLOW   APPearance CLOUDY (A) CLEAR   Specific Gravity, Urine 1.025 1.005 - 1.030   pH 5.5 5.0 - 8.0   Glucose, UA NEGATIVE NEGATIVE mg/dL   Hgb urine dipstick SMALL (A) NEGATIVE   Bilirubin Urine SMALL (A) NEGATIVE   Ketones, ur 40 (A) NEGATIVE mg/dL   Protein, ur NEGATIVE NEGATIVE mg/dL   Urobilinogen, UA 1.0 0.0 - 1.0 mg/dL   Nitrite NEGATIVE NEGATIVE   Leukocytes, UA NEGATIVE NEGATIVE  Urine microscopic-add on  Result Value Ref Range   Squamous Epithelial / LPF FEW (A) RARE   RBC / HPF 3-6 <3 RBC/hpf   Bacteria, UA RARE RARE     Imaging Review  Dg Lumbar Spine Complete  01/17/2015   CLINICAL DATA:  Status post fall today. Low back pain. Initial encounter.  EXAM: LUMBAR SPINE - COMPLETE 4+ VIEW  COMPARISON:  CT abdomen and pelvis 09/21/2012. Plain films lumbar spine 02/08/2011.  FINDINGS: There is no evidence of lumbar spine fracture. Alignment is normal. Intervertebral disc spaces are maintained.  IMPRESSION: Negative examination   Electronically Signed   By: Inge Rise M.D.   On: 01/17/2015 20:37   Dg Hip Unilat With Pelvis 2-3 Views Left  01/17/2015   CLINICAL DATA:  Status post fall today with a blow to the left hip. Left hip pain. Initial encounter.  EXAM: LEFT HIP (WITH PELVIS) 2-3 VIEWS  COMPARISON:  None.   FINDINGS: There is no evidence of hip fracture or dislocation. There is no evidence of arthropathy or other focal bone abnormality.  IMPRESSION: Negative examination.   Electronically Signed   By: Inge Rise M.D.   On: 01/17/2015 20:37  I personally reviewed the imaging tests through PACS system I reviewed available ER/hospitalization records through the EMR      EKG Interpretation None      MDM   Final diagnoses:  Pain  Fall  Left hip pain    Mechanical fall.  Ambulatory in the emergency department now.  Imaging without fracture.  Labs without significant abnormality.  Discharge home in good condition.  Primary care follow-up.    Jola Schmidt, MD 01/20/15 [phone]

## 2015-03-12 ENCOUNTER — Emergency Department (INDEPENDENT_AMBULATORY_CARE_PROVIDER_SITE_OTHER)
Admission: EM | Admit: 2015-03-12 | Discharge: 2015-03-12 | Disposition: A | Discharge status: Home / Self Care | Payer: BLUE CROSS/BLUE SHIELD | Source: Home / Self Care | Attending: Family Medicine | Admitting: Family Medicine

## 2015-03-12 ENCOUNTER — Emergency Department (INDEPENDENT_AMBULATORY_CARE_PROVIDER_SITE_OTHER): Payer: BLUE CROSS/BLUE SHIELD

## 2015-03-12 DIAGNOSIS — R0789 Other chest pain: Secondary | ICD-10-CM

## 2015-03-12 MED ORDER — DICLOFENAC POTASSIUM 50 MG PO TABS: 50.0000 mg | ORAL_TABLET | Freq: Three times a day (TID) | ORAL | Status: DC

## 2015-03-12 MED ORDER — AZITHROMYCIN 250 MG PO TABS: ORAL_TABLET | ORAL | Status: DC

## 2015-03-12 NOTE — Discharge Instructions (Signed)
Take medicine as prescribed , see your doctor for repeat xray as suggested by radiologist.

## 2015-03-12 NOTE — ED Provider Notes (Signed)
CSN: [id number]     Arrival date & time 03/12/15  1832 History   First MD Initiated Contact with Patient 03/12/15 1850     No chief complaint on file.  (Consider location/radiation/quality/duration/timing/severity/associated sxs/prior Treatment) Patient is a 54 y.o. female presenting with chest pain. The history is provided by the patient.  Chest Pain Pain location:  R lateral chest Pain quality: sharp   Pain radiates to:  Upper back Pain radiates to the back: yes   Onset quality:  Gradual Duration:  2 days Progression:  Unchanged Chronicity:  New Context: breathing and movement   Context comment:  Stress from relative having recent cva and then dying last eve. Relieved by:  None tried Worsened by:  Nothing tried Ineffective treatments:  None tried Associated symptoms: anxiety and shortness of breath   Associated symptoms: no lower extremity edema     Past Medical History  Diagnosis Date  . Fibromyalgia     PT ON LYRIC   . Hypertension     HX OF ELEVATED B/P FOR SEVERAL YRS--NO MEDS UNTIL LAST WEEK--PT WAS C/O OF CHEST PRESSURE AND B/P ELEVATED--PT SAW NURSE PRACTIONER ON 08/21/11 AND STATES EKG WAS DONE--TOLD ABNORMAL--PUT ON B/P MEDICATION LISINOPRIL AND TOLD SHE WAS TO SEE A CARDIOLOGIST.  [name] Shortness of breath     WITH EXERTION--NEW FOR PT--PT ALSO REPORTS FEELING TIRED  . GERD (gastroesophageal reflux disease)     NOT ON ANY MEDS AT PRESENT  . Headache(784.0)     HX OF MIGRAINES  . Anxiety     HX OF ANXIETY ATTACKS  . Back pain, chronic    Past Surgical History  Procedure Laterality Date  . Tubal ligation    . Constipation    . Robotic assisted lap vaginal hysterectomy  09/14/2011    Procedure: ROBOTIC ASSISTED LAPAROSCOPIC VAGINAL HYSTERECTOMY;  Surgeon: Agnes Lawrence, MD;  Location: [facility];  Service: Gynecology;  Laterality: N/A;  . Cystoscopy w/ ureteral stent placement  09/23/2012    Procedure: CYSTOSCOPY WITH RETROGRADE PYELOGRAM/URETERAL STENT PLACEMENT;   Surgeon: Alexis Frock, MD;  Location: [facility];  Service: Urology;  Laterality: Right;  . Cystoscopy with retrograde pyelogram, ureteroscopy and stent placement  10/08/2012    Procedure: CYSTOSCOPY WITH RETROGRADE PYELOGRAM, URETEROSCOPY AND STENT PLACEMENT;  Surgeon: Alexis Frock, MD;  Location: [facility];  Service: Urology;  Laterality: Right;  90 MIN NO RETROGRADE NEEDS FLEX URETEROSCOPE   . Holmium laser application  6/81/1572    Procedure: HOLMIUM LASER APPLICATION;  Surgeon: Alexis Frock, MD;  Location: [facility];  Service: Urology;  Laterality: Right;  . Left heart catheterization with coronary angiogram N/A 10/29/2011    Procedure: LEFT HEART CATHETERIZATION WITH CORONARY ANGIOGRAM;  Surgeon: Leonie Man, MD;  Location: [facility] CATH LAB;  Service: Cardiovascular;  Laterality: N/A;   No family history on file. History  Substance Use Topics  . Smoking status: Never Smoker   . Smokeless tobacco: Never Used  . Alcohol Use: No   OB History    No data available     Review of Systems  Constitutional: Negative.   Respiratory: Positive for shortness of breath.   Cardiovascular: Positive for chest pain.  Psychiatric/Behavioral: The patient is nervous/anxious.     Allergies  Review of patient's allergies indicates no known allergies.  Home Medications   Prior to Admission medications   Medication Sig Start Date End Date Taking? Authorizing Provider  albuterol (PROVENTIL HFA;VENTOLIN HFA) 108 (90 BASE) MCG/ACT inhaler Inhale  1-2 puffs into the lungs every 4 (four) hours as needed for wheezing or shortness of breath (and or cough). 08/06/14   Rhetta Mura Schorr, NP  azithromycin (ZITHROMAX Z-PAK) 250 MG tablet Take as directed on pack 03/12/15   Billy Fischer, MD  diclofenac (CATAFLAM) 50 MG tablet Take 1 tablet (50 mg total) by mouth 3 (three) times daily. For chest pains 03/12/15   Billy Fischer, MD  DULoxetine (CYMBALTA) 60 MG capsule Take 60 mg  by mouth daily.    Historical Provider, MD  estrogens, conjugated, (PREMARIN) 0.625 MG tablet Take 1 tablet (0.625 mg total) by mouth daily. 10/06/14   Shelly Bombard, MD  ibuprofen (ADVIL,MOTRIN) 600 MG tablet Take 1 tablet (600 mg total) by mouth every 6 (six) hours as needed (pain). 01/17/15   Jola Schmidt, MD  lisinopril-hydrochlorothiazide (PRINZIDE,ZESTORETIC) 10-12.5 MG per tablet Take 1 tablet by mouth daily.    Historical Provider, MD  predniSONE (DELTASONE) 50 MG tablet Take 1 tablet (50 mg total) by mouth daily. Patient taking differently: Take 50 mg by mouth daily as needed (for fibarmalaiga flare up).  08/10/14   Gregor Hams, MD  pregabalin (LYRICA) 100 MG capsule Take 100 mg by mouth 2 (two) times daily.    Historical Provider, MD   BP 144/76 mmHg  Pulse 88  Temp(Src) 98.6 F (37 C) (Oral)  SpO2 97%  LMP 08/04/2011 Physical Exam  Constitutional: She is oriented to person, place, and time. She appears well-developed and well-nourished.  Neck: Normal range of motion. Neck supple.  Cardiovascular: Normal rate, regular rhythm, normal heart sounds and intact distal pulses.   Pulmonary/Chest: Effort normal and breath sounds normal. She has no wheezes. She has no rales. She exhibits tenderness.  Lymphadenopathy:    She has no cervical adenopathy.  Neurological: She is alert and oriented to person, place, and time.  Skin: Skin is warm and dry.  Nursing note and vitals reviewed.   ED Course  Procedures (including critical care time) Labs Review Labs Reviewed - No data to display  Imaging Review Dg Chest 2 View  03/12/2015   CLINICAL DATA:  Left-sided chest pain radiating to back for 1 week  EXAM: CHEST  2 VIEW  COMPARISON:  Multiple prior studies including 08/10/2014  FINDINGS: Stable sigmoid scoliosis of the thoracolumbar spine. Heart size and vascular pattern are normal. There is mild density at the left lung base with more prominent density at the right lung base. There may  be a small right pleural effusion.  IMPRESSION: Bibasilar opacities likely due in part to atelectasis although there may be a right small pleural effusion and asymmetry suggests the possibility of pneumonia in the right lower lobe. Followup PA and lateral chest X-ray is recommended in 3-4 weeks following trial of antibiotic therapy to ensure resolution and exclude underlying malignancy.   Electronically Signed   By: Skipper Cliche M.D.   On: 03/12/2015 19:24   X-rays reviewed and report per radiologist.   MDM   1. Right-sided chest wall pain        Billy Fischer, MD 03/12/15 2007

## 2015-05-02 ENCOUNTER — Other Ambulatory Visit: Payer: Self-pay | Admitting: Physician Assistant

## 2015-05-02 DIAGNOSIS — M545 Low back pain: Secondary | ICD-10-CM

## 2015-05-10 ENCOUNTER — Ambulatory Visit
Admission: RE | Admit: 2015-05-10 | Discharge: 2015-05-10 | Disposition: A | Discharge status: Home / Self Care | Payer: BLUE CROSS/BLUE SHIELD | Source: Ambulatory Visit | Attending: Physician Assistant | Admitting: Physician Assistant

## 2015-05-10 DIAGNOSIS — M545 Low back pain: Secondary | ICD-10-CM

## 2015-09-15 ENCOUNTER — Ambulatory Visit: Payer: Self-pay

## 2016-06-08 ENCOUNTER — Emergency Department (HOSPITAL_COMMUNITY): Payer: Self-pay

## 2016-06-08 ENCOUNTER — Encounter (HOSPITAL_COMMUNITY): Payer: Self-pay

## 2016-06-08 ENCOUNTER — Emergency Department (HOSPITAL_COMMUNITY)
Admission: EM | Admit: 2016-06-08 | Discharge: 2016-06-08 | Disposition: A | Discharge status: Home / Self Care | Payer: Self-pay | Attending: Emergency Medicine | Admitting: Emergency Medicine

## 2016-06-08 DIAGNOSIS — I10 Essential (primary) hypertension: Secondary | ICD-10-CM | POA: Insufficient documentation

## 2016-06-08 DIAGNOSIS — R109 Unspecified abdominal pain: Secondary | ICD-10-CM

## 2016-06-08 DIAGNOSIS — Z79899 Other long term (current) drug therapy: Secondary | ICD-10-CM | POA: Insufficient documentation

## 2016-06-08 DIAGNOSIS — M62830 Muscle spasm of back: Secondary | ICD-10-CM | POA: Insufficient documentation

## 2016-06-08 LAB — URINE MICROSCOPIC-ADD ON: RBC / HPF: NONE SEEN RBC/hpf (ref 0–5)

## 2016-06-08 LAB — URINALYSIS, ROUTINE W REFLEX MICROSCOPIC
Bilirubin Urine: NEGATIVE
Glucose, UA: NEGATIVE mg/dL
Hgb urine dipstick: NEGATIVE
Ketones, ur: NEGATIVE mg/dL
NITRITE: NEGATIVE
Protein, ur: NEGATIVE mg/dL
SPECIFIC GRAVITY, URINE: 1.026 (ref 1.005–1.030)
pH: 6 (ref 5.0–8.0)

## 2016-06-08 MED ORDER — METHOCARBAMOL 500 MG PO TABS: 500.0000 mg | ORAL_TABLET | Freq: Three times a day (TID) | ORAL | 0 refills | Status: DC | PRN

## 2016-06-08 NOTE — ED Notes (Signed)
Pt calls to nursing desk c/o spasms to L flank and hip but also needing to void.  Pt able to ambulate unassisted during episode. Dr. Christy Gentles aware.

## 2016-06-08 NOTE — Discharge Instructions (Signed)

## 2016-06-08 NOTE — ED Notes (Signed)
Pt is in stable condition upon d/c and ambulates from ED. 

## 2016-06-08 NOTE — ED Provider Notes (Signed)
[place] DEPT Provider Note   CSN: [id number] Arrival date & time: 06/08/16  0706     History   Chief Complaint Chief Complaint  Patient presents with  . Flank Pain    HPI Lisa MCCLUNG is a 55 y.o. female.  The history is provided by the patient.  Flank Pain  This is a new problem. The current episode started more than 2 days ago. The problem occurs daily. The problem has been gradually worsening. Pertinent negatives include no chest pain, no abdominal pain and no shortness of breath. The symptoms are aggravated by twisting and walking. The symptoms are relieved by rest.  Patient with h/o fibromyalgia presents with left flank pain for one week Denies direct trauma to left flank/hip but reports she fell a month ago injuring right leg so she might not be walking correctly She reports it hurts to walk No abd pain No dysuria No fever No vomiting No cp/sob She reports previous h/o kidney stone so she wanted to make that was not causing her issues   Past Medical History:  Diagnosis Date  . Anxiety    HX OF ANXIETY ATTACKS  . Back pain, chronic   . Fibromyalgia    PT ON LYRIC   . GERD (gastroesophageal reflux disease)    NOT ON ANY MEDS AT PRESENT  . Headache(784.0)    HX OF MIGRAINES  . Hypertension    HX OF ELEVATED B/P FOR SEVERAL YRS--NO MEDS UNTIL LAST WEEK--PT WAS C/O OF CHEST PRESSURE AND B/P ELEVATED--PT SAW NURSE PRACTIONER ON 08/21/11 AND STATES EKG WAS DONE--TOLD ABNORMAL--PUT ON B/P MEDICATION LISINOPRIL AND TOLD SHE WAS TO SEE A CARDIOLOGIST.  [name] Shortness of breath    WITH EXERTION--NEW FOR PT--PT ALSO REPORTS FEELING TIRED    Patient Active Problem List   Diagnosis Date Noted  . Leiomyoma of uterus, unspecified 09/14/2011    Past Surgical History:  Procedure Laterality Date  . CONSTIPATION    . CYSTOSCOPY W/ URETERAL STENT PLACEMENT  09/23/2012   Procedure: CYSTOSCOPY WITH RETROGRADE PYELOGRAM/URETERAL STENT PLACEMENT;  Surgeon: Alexis Frock,  MD;  Location: [facility];  Service: Urology;  Laterality: Right;  . CYSTOSCOPY WITH RETROGRADE PYELOGRAM, URETEROSCOPY AND STENT PLACEMENT  10/08/2012   Procedure: CYSTOSCOPY WITH RETROGRADE PYELOGRAM, URETEROSCOPY AND STENT PLACEMENT;  Surgeon: Alexis Frock, MD;  Location: [facility];  Service: Urology;  Laterality: Right;  90 MIN NO RETROGRADE NEEDS FLEX URETEROSCOPE   . HOLMIUM LASER APPLICATION  [id number]   Procedure: HOLMIUM LASER APPLICATION;  Surgeon: Alexis Frock, MD;  Location: [facility];  Service: Urology;  Laterality: Right;  . LEFT HEART CATHETERIZATION WITH CORONARY ANGIOGRAM N/A 10/29/2011   Procedure: LEFT HEART CATHETERIZATION WITH CORONARY ANGIOGRAM;  Surgeon: Leonie Man, MD;  Location: [facility] CATH LAB;  Service: Cardiovascular;  Laterality: N/A;  . ROBOTIC ASSISTED LAP VAGINAL HYSTERECTOMY  09/14/2011   Procedure: ROBOTIC ASSISTED LAPAROSCOPIC VAGINAL HYSTERECTOMY;  Surgeon: Agnes Lawrence, MD;  Location: [facility];  Service: Gynecology;  Laterality: N/A;  . TUBAL LIGATION      OB History    No data available       Home Medications    Prior to Admission medications   Medication Sig Start Date End Date Taking? Authorizing Provider  albuterol (PROVENTIL HFA;VENTOLIN HFA) 108 (90 BASE) MCG/ACT inhaler Inhale 1-2 puffs into the lungs every 4 (four) hours as needed for wheezing or shortness of breath (and or cough). 08/06/14   Jeryl Columbia, NP  azithromycin (ZITHROMAX Z-PAK) 250 MG tablet Take as directed on pack 03/12/15   Billy Fischer, MD  diclofenac (CATAFLAM) 50 MG tablet Take 1 tablet (50 mg total) by mouth 3 (three) times daily. For chest pains 03/12/15   Billy Fischer, MD  DULoxetine (CYMBALTA) 60 MG capsule Take 60 mg by mouth daily.    Historical Provider, MD  estrogens, conjugated, (PREMARIN) 0.625 MG tablet Take 1 tablet (0.625 mg total) by mouth daily. 10/06/14   Shelly Bombard, MD  ibuprofen (ADVIL,MOTRIN) 600 MG tablet  Take 1 tablet (600 mg total) by mouth every 6 (six) hours as needed (pain). 01/17/15   Jola Schmidt, MD  lisinopril-hydrochlorothiazide (PRINZIDE,ZESTORETIC) 10-12.5 MG per tablet Take 1 tablet by mouth daily.    Historical Provider, MD  predniSONE (DELTASONE) 50 MG tablet Take 1 tablet (50 mg total) by mouth daily. Patient taking differently: Take 50 mg by mouth daily as needed (for fibarmalaiga flare up).  08/10/14   Gregor Hams, MD  pregabalin (LYRICA) 100 MG capsule Take 100 mg by mouth 2 (two) times daily.    Historical Provider, MD    Family History History reviewed. No pertinent family history.  Social History Social History  Substance Use Topics  . Smoking status: Never Smoker  . Smokeless tobacco: Never Used  . Alcohol use No     Allergies   Codeine   Review of Systems Review of Systems  Constitutional: Negative for fever.  Respiratory: Negative for shortness of breath.   Cardiovascular: Negative for chest pain.  Gastrointestinal: Negative for abdominal pain.  Genitourinary: Positive for flank pain. Negative for dysuria.  All other systems reviewed and are negative.    Physical Exam Updated Vital Signs BP 127/80 (BP Location: Right Arm)   Pulse 89   Temp 98.9 F (37.2 C) (Oral)   Resp 14   Ht 5\' 5"  (1.651 m)   Wt 90.7 kg   LMP 08/04/2011   SpO2 97%   BMI 33.28 kg/m   Physical Exam CONSTITUTIONAL: Well developed/well nourished HEAD: Normocephalic/atraumatic EYES: EOMI/PERRL ENMT: Mucous membranes moist NECK: supple no meningeal signs SPINE/BACK:entire spine nontender, left lumbar paraspinal tenderness.  No bruising/crepitance/stepoffs noted to spine CV: S1/S2 noted, no murmurs/rubs/gallops noted LUNGS: Lungs are clear to auscultation bilaterally, no apparent distress ABDOMEN: soft, nontender, no rebound or guarding, bowel sounds noted throughout abdomen GU:no cva tenderness, no bruising/erythema noted NEURO: Pt is awake/alert/appropriate, moves all  extremitiesx4.  No facial droop.   EXTREMITIES: pulses normal/equal, full ROM, mild tenderness to ROM of left hip.  No deformities noted.  No other focal extremity tenderness SKIN: warm, color normal PSYCH: no abnormalities of mood noted, alert and oriented to situation   ED Treatments / Results  Labs (all labs ordered are listed, but only abnormal results are displayed) Labs Reviewed  URINALYSIS, ROUTINE W REFLEX MICROSCOPIC (NOT AT [facility]) - Abnormal; Notable for the following:       Result Value   APPearance HAZY (*)    Leukocytes, UA SMALL (*)    All other components within normal limits  URINE MICROSCOPIC-ADD ON - Abnormal; Notable for the following:    Squamous Epithelial / LPF 6-30 (*)    Bacteria, UA MANY (*)    All other components within normal limits  URINE CULTURE    EKG  EKG Interpretation None       Radiology Dg Hip Unilat W Or Wo Pelvis 2-3 Views Left  Result Date: 06/08/2016 CLINICAL DATA:  [place] down [place]  stairs at home 1 month ago, LEFT hip and RIGHT knee pain EXAM: DG HIP (WITH OR WITHOUT PELVIS) 2-3V LEFT COMPARISON:  01/17/2015 FINDINGS: Hip and SI joints symmetric and preserved. Osseous mineralization normal. No acute fracture, dislocation or bone destruction. IMPRESSION: No acute osseous abnormalities. Electronically Signed   By: Lavonia Dana M.D.   On: 06/08/2016 09:05    Procedures Procedures (including critical care time)  Medications Ordered in ED Medications - No data to display   Initial Impression / Assessment and Plan / ED Course  I have reviewed the triage vital signs and the nursing notes.  Pertinent labs & imaging results that were available during my care of the patient were reviewed by me and considered in my medical decision making (see chart for details).  Clinical Course    Pt stable This appears more MSK in nature as pain worsened with palpation and range of motion with left hip Imaging/labs ordered Pt requests to take her home  lyrica but does not want any other medications    Pt with some continued muscle spasms with movement No other new complaints Suspect this is musculoskeletal in nature She requests muscle relaxants Will give short course of robaxin.  Advised caution while taking with lyrica Referred to PCP next week We discussed strict return precautions U/a sent for culture as patient denies significant urinary symptoms, and pain is more MSK in nature   Final Clinical Impressions(s) / ED Diagnoses   Final diagnoses:  Flank pain  Muscle spasm of back    New Prescriptions Discharge Medication List as of 06/08/2016 10:21 AM    START taking these medications   Details  methocarbamol (ROBAXIN) 500 MG tablet Take 1 tablet (500 mg total) by mouth every 8 (eight) hours as needed for muscle spasms., Starting Fri 06/08/2016, Print         Ripley Fraise, MD 06/08/16 1125

## 2016-06-08 NOTE — ED Triage Notes (Signed)
Pt presents with 1 week h/o L flank pain.  Pt reports falling down 6 stairs x 1 month ago, injurying R leg but denies any recent injury.  Pt reports pain from L flank radiates into L hip.  Pt denies dysuria but reports urine is darker than normal.  Pt any abdominal pain or nausea.

## 2016-06-09 LAB — URINE CULTURE: Culture: 50000 — AB

## 2016-06-10 ENCOUNTER — Telehealth (HOSPITAL_BASED_OUTPATIENT_CLINIC_OR_DEPARTMENT_OTHER): Payer: Self-pay

## 2016-06-10 NOTE — Telephone Encounter (Signed)
Post ED Visit - Positive Culture Follow-up  Culture report reviewed by antimicrobial stewardship pharmacist:  []  Elenor Quinones, Pharm.D. []  Heide Guile, Pharm.D., BCPS []  Parks Neptune, Pharm.D. []  Alycia Rossetti, Pharm.D., BCPS []  [place], Pharm.D., BCPS, AAHIVP []  Legrand Como, Pharm.D., BCPS, AAHIVP []  Milus Glazier, Pharm.D. []  Rob Evette Doffing, Pharm.D. Rachel Rumsbarger Pharm D Positive urine culture and no further patient follow-up is required at this time.   Genia Del 06/10/2016, 9:34 AM

## 2016-06-18 ENCOUNTER — Encounter (HOSPITAL_COMMUNITY): Payer: Self-pay | Admitting: *Deleted

## 2016-06-18 ENCOUNTER — Emergency Department (HOSPITAL_COMMUNITY)
Admission: EM | Admit: 2016-06-18 | Discharge: 2016-06-18 | Disposition: A | Discharge status: Home / Self Care | Payer: BLUE CROSS/BLUE SHIELD | Attending: Emergency Medicine | Admitting: Emergency Medicine

## 2016-06-18 DIAGNOSIS — M545 Low back pain: Secondary | ICD-10-CM

## 2016-06-18 DIAGNOSIS — I10 Essential (primary) hypertension: Secondary | ICD-10-CM | POA: Insufficient documentation

## 2016-06-18 DIAGNOSIS — M5440 Lumbago with sciatica, unspecified side: Secondary | ICD-10-CM | POA: Insufficient documentation

## 2016-06-18 MED ORDER — LIDOCAINE 5 % EX PTCH
1.0000 | MEDICATED_PATCH | CUTANEOUS | Status: DC
  Administered 2016-06-18: 1 via TRANSDERMAL
  Filled 2016-06-18 (×2): qty 1

## 2016-06-18 MED ORDER — NAPROXEN 500 MG PO TABS: 500.0000 mg | ORAL_TABLET | Freq: Two times a day (BID) | ORAL | 0 refills | Status: DC

## 2016-06-18 MED ORDER — CYCLOBENZAPRINE HCL 5 MG PO TABS: 10.0000 mg | ORAL_TABLET | Freq: Three times a day (TID) | ORAL | 0 refills | Status: DC | PRN

## 2016-06-18 MED ORDER — KETOROLAC TROMETHAMINE 60 MG/2ML IM SOLN
60.0000 mg | Freq: Once | INTRAMUSCULAR | Status: AC
  Administered 2016-06-18: 60 mg via INTRAMUSCULAR
  Filled 2016-06-18: qty 2

## 2016-06-18 MED ORDER — CYCLOBENZAPRINE HCL 10 MG PO TABS
5.0000 mg | ORAL_TABLET | Freq: Once | ORAL | Status: AC
  Administered 2016-06-18: 5 mg via ORAL
  Filled 2016-06-18: qty 1

## 2016-06-18 MED ORDER — PREDNISONE 20 MG PO TABS: 40.0000 mg | ORAL_TABLET | Freq: Every day | ORAL | 0 refills | Status: DC

## 2016-06-18 MED ORDER — PREDNISONE 20 MG PO TABS
60.0000 mg | ORAL_TABLET | Freq: Once | ORAL | Status: AC
  Administered 2016-06-18: 60 mg via ORAL
  Filled 2016-06-18: qty 3

## 2016-06-18 NOTE — ED Notes (Addendum)
Pt. Took home medication (lyrica) for fibromyalgia with EDP permission

## 2016-06-18 NOTE — ED Provider Notes (Signed)
[place] DEPT Provider Note   CSN: [id number] Arrival date & time: 06/18/16  1003     History   Chief Complaint Chief Complaint  Patient presents with  . Back Pain    HPI Lisa Casey is a 55 y.o. female.  Lisa Casey is a 55 y.o. female with h/o anxiety, fibromyalgia, HTN, GERD presents to ED with complaint of low back pain. Patient has a history of chronic left sided sciatica. She was seen here one week ago for left sided back pain with radiation into left hip. DG hip and pelvis normal. U/A had leukocytes and culture grew lactobacillus. D/c's with robaxin. Patient returns today with continued back pain with pain radiating to back of legs b/l. Patient states pain is constant and sharp in nature. She reports the pain feels similar to her typical left sciatica pain. She reports intermittent paresthesias. She is able to ambulate; however, painful. Denies fever, IVDU, h/o cancer, h/o back surgeries, unexpected weight loss, diaphoresis, loss of bowel or bladder function, weakness, saddle anesthesia, or perianal numbness. No abdominal pain, N/V, chest pain, or SOB.       Past Medical History:  Diagnosis Date  . Anxiety    HX OF ANXIETY ATTACKS  . Back pain, chronic   . Fibromyalgia    PT ON LYRIC   . GERD (gastroesophageal reflux disease)    NOT ON ANY MEDS AT PRESENT  . Headache(784.0)    HX OF MIGRAINES  . Hypertension    HX OF ELEVATED B/P FOR SEVERAL YRS--NO MEDS UNTIL LAST WEEK--PT WAS C/O OF CHEST PRESSURE AND B/P ELEVATED--PT SAW NURSE PRACTIONER ON 08/21/11 AND STATES EKG WAS DONE--TOLD ABNORMAL--PUT ON B/P MEDICATION LISINOPRIL AND TOLD SHE WAS TO SEE A CARDIOLOGIST.  [name] Shortness of breath    WITH EXERTION--NEW FOR PT--PT ALSO REPORTS FEELING TIRED    Patient Active Problem List   Diagnosis Date Noted  . Leiomyoma of uterus, unspecified 09/14/2011    Past Surgical History:  Procedure Laterality Date  . CONSTIPATION    . CYSTOSCOPY W/ URETERAL STENT  PLACEMENT  09/23/2012   Procedure: CYSTOSCOPY WITH RETROGRADE PYELOGRAM/URETERAL STENT PLACEMENT;  Surgeon: Alexis Frock, MD;  Location: [facility];  Service: Urology;  Laterality: Right;  . CYSTOSCOPY WITH RETROGRADE PYELOGRAM, URETEROSCOPY AND STENT PLACEMENT  10/08/2012   Procedure: CYSTOSCOPY WITH RETROGRADE PYELOGRAM, URETEROSCOPY AND STENT PLACEMENT;  Surgeon: Alexis Frock, MD;  Location: [facility];  Service: Urology;  Laterality: Right;  90 MIN NO RETROGRADE NEEDS FLEX URETEROSCOPE   . HOLMIUM LASER APPLICATION  [id number]   Procedure: HOLMIUM LASER APPLICATION;  Surgeon: Alexis Frock, MD;  Location: [facility];  Service: Urology;  Laterality: Right;  . LEFT HEART CATHETERIZATION WITH CORONARY ANGIOGRAM N/A 10/29/2011   Procedure: LEFT HEART CATHETERIZATION WITH CORONARY ANGIOGRAM;  Surgeon: Leonie Man, MD;  Location: [facility] CATH LAB;  Service: Cardiovascular;  Laterality: N/A;  . ROBOTIC ASSISTED LAP VAGINAL HYSTERECTOMY  09/14/2011   Procedure: ROBOTIC ASSISTED LAPAROSCOPIC VAGINAL HYSTERECTOMY;  Surgeon: Agnes Lawrence, MD;  Location: [facility];  Service: Gynecology;  Laterality: N/A;  . TUBAL LIGATION      OB History    No data available       Home Medications    Prior to Admission medications   Medication Sig Start Date End Date Taking? Authorizing Provider  hydrOXYzine (VISTARIL) 50 MG capsule Take 50 mg by mouth daily as needed for anxiety. 04/13/16  Yes Historical Provider, MD  pregabalin (LYRICA)  50 MG capsule Take 150 mg by mouth 2 (two) times daily.   Yes Historical Provider, MD  traZODone (DESYREL) 50 MG tablet Take 50 mg by mouth at bedtime as needed for sleep. 05/23/16  Yes Historical Provider, MD  albuterol (PROVENTIL HFA;VENTOLIN HFA) 108 (90 BASE) MCG/ACT inhaler Inhale 1-2 puffs into the lungs every 4 (four) hours as needed for wheezing or shortness of breath (and or cough). Patient not taking: Reported on 06/18/2016 08/06/14    Rhetta Mura Schorr, NP  azithromycin (ZITHROMAX Z-PAK) 250 MG tablet Take as directed on pack Patient not taking: Reported on 06/18/2016 03/12/15   Billy Fischer, MD  cyclobenzaprine (FLEXERIL) 5 MG tablet Take 2 tablets (10 mg total) by mouth 3 (three) times daily as needed for muscle spasms. 06/18/16   Roxanna Mew, PA-C  diclofenac (CATAFLAM) 50 MG tablet Take 1 tablet (50 mg total) by mouth 3 (three) times daily. For chest pains Patient not taking: Reported on 06/18/2016 03/12/15   Billy Fischer, MD  estrogens, conjugated, (PREMARIN) 0.625 MG tablet Take 1 tablet (0.625 mg total) by mouth daily. Patient not taking: Reported on 06/18/2016 10/06/14   Shelly Bombard, MD  naproxen (NAPROSYN) 500 MG tablet Take 1 tablet (500 mg total) by mouth 2 (two) times daily. 06/18/16   Roxanna Mew, PA-C  PARoxetine (PAXIL) 20 MG tablet Take 20 mg by mouth daily. 05/23/16   Historical Provider, MD  predniSONE (DELTASONE) 20 MG tablet Take 2 tablets (40 mg total) by mouth daily. 06/18/16   Roxanna Mew, PA-C    Family History History reviewed. No pertinent family history.  Social History Social History  Substance Use Topics  . Smoking status: Never Smoker  . Smokeless tobacco: Never Used  . Alcohol use No     Allergies   Codeine   Review of Systems Review of Systems  Constitutional: Negative for chills, diaphoresis and fever.  HENT: Negative for trouble swallowing.   Eyes: Negative for visual disturbance.  Respiratory: Negative for shortness of breath.   Cardiovascular: Negative for chest pain.  Gastrointestinal: Negative for abdominal pain, constipation, diarrhea, nausea and vomiting.  Genitourinary: Negative for dysuria and hematuria.  Musculoskeletal: Positive for back pain.  Skin: Negative for rash.  Neurological: Negative for dizziness, syncope, weakness, light-headedness, numbness and headaches.     Physical Exam Updated Vital Signs BP 145/84   Pulse 64   Temp  98.6 F (37 C) (Oral)   Resp 16   Ht 5\' 5"  (1.651 m)   Wt 88.5 kg   LMP 08/04/2011   SpO2 95%   BMI 32.45 kg/m   Physical Exam  Constitutional: She appears well-developed and well-nourished. No distress.  HENT:  Head: Normocephalic and atraumatic.  Mouth/Throat: Oropharynx is clear and moist. No oropharyngeal exudate.  Eyes: Conjunctivae and EOM are normal. Pupils are equal, round, and reactive to light. Right eye exhibits no discharge. Left eye exhibits no discharge. No scleral icterus.  Neck: Normal range of motion and phonation normal. Neck supple. No neck rigidity. Normal range of motion present.  Cardiovascular: Normal rate, regular rhythm, normal heart sounds and intact distal pulses.   No murmur heard. Pulmonary/Chest: Effort normal and breath sounds normal. No respiratory distress. She has no wheezes. She has no rales.  Abdominal: Soft. Bowel sounds are normal. She exhibits no distension and no pulsatile midline mass. There is no tenderness. There is no rigidity, no rebound, no guarding and no CVA tenderness.  Musculoskeletal: Normal range of  motion.  Mild TTP of lumbar spine and iliosacral joint b/l. TTP of lumbar paravertebral muscles. Patient is ambulatory.   Neurological: She is alert. She is not disoriented. Coordination and gait normal. GCS eye subscore is 4. GCS verbal subscore is 5. GCS motor subscore is 6.  Mental Status:  Alert, thought content appropriate, able to give a coherent history. Speech fluent without evidence of aphasia. Able to follow 2 step commands without difficulty.  Cranial Nerves:  II: pupils equal, round, reactive to light III,IV, VI: ptosis not present, extra-ocular motions intact bilaterally  V,VII: smile symmetric, facial light touch sensation equal VIII: hearing grossly normal to voice  X: uvula elevates symmetrically  XI: bilateral shoulder shrug symmetric and strong XII: midline tongue extension without fassiculations Motor:  Normal tone.  5/5 in upper and lower extremities bilaterally including strong and equal grip strength and dorsiflexion/plantar flexion Sensory: light touch normal in all extremities. Cerebellar: normal finger-to-nose with bilateral upper extremities Gait: normal gait and balance CV: distal pulses palpable throughout  Skin: Skin is warm and dry. She is not diaphoretic.  Psychiatric: She has a normal mood and affect. Her behavior is normal.     ED Treatments / Results  Labs (all labs ordered are listed, but only abnormal results are displayed) Labs Reviewed - No data to display  EKG  EKG Interpretation None       Radiology No results found.  Procedures Procedures (including critical care time)  Medications Ordered in ED Medications  predniSONE (DELTASONE) tablet 60 mg (not administered)  lidocaine (LIDODERM) 5 % 1 patch (not administered)  ketorolac (TORADOL) injection 60 mg (60 mg Intramuscular Given 06/18/16 1519)  cyclobenzaprine (FLEXERIL) tablet 5 mg (5 mg Oral Given 06/18/16 1518)     Initial Impression / Assessment and Plan / ED Course  I have reviewed the triage vital signs and the nursing notes.  Pertinent labs & imaging results that were available during my care of the patient were reviewed by me and considered in my medical decision making (see chart for details).  Clinical Course    Patient presents to ED with complaint of back pain. Patient is afebrile and non-toxic appearing in NAD. VSS. No neurological deficits on exam.  Patient can walk but states is painful.  No loss of bowel or bladder control.  No concern for cauda equina.  No fever, night sweats, weight loss, h/o cancer, IVDU.  Pain managed in ED. ?MSK vs. ?sciatica vs. ?radiculopathy. Patient does not currently have insurance and she is concerned regarding follow up. Will consult care management for assistance in insurance questions and follow up.   Resources provided for follow up. Patient endorses improvement in  sxs. Discussed with patient symptomatic management to include heat/ice and gentle stretching. Rx flexeril, NSAIDs, and PO steroids. Discussed follow up with PCP. Return precautions given. Patient voiced understanding and is agreeable.    Final Clinical Impressions(s) / ED Diagnoses   Final diagnoses:  Bilateral low back pain, unspecified chronicity, with sciatica presence unspecified    New Prescriptions New Prescriptions   CYCLOBENZAPRINE (FLEXERIL) 5 MG TABLET    Take 2 tablets (10 mg total) by mouth 3 (three) times daily as needed for muscle spasms.   NAPROXEN (NAPROSYN) 500 MG TABLET    Take 1 tablet (500 mg total) by mouth 2 (two) times daily.   PREDNISONE (DELTASONE) 20 MG TABLET    Take 2 tablets (40 mg total) by mouth daily.     Roxanna Mew, [place] 06/18/16  [address] [place], [place] 06/18/16 1752    Nat Christen, MD 06/19/16 1240

## 2016-06-18 NOTE — ED Notes (Signed)
Pt stated she is in a lot of pain. Pt Crying

## 2016-06-18 NOTE — Discharge Instructions (Signed)
Read the information below.  You are being prescribed naprosyn, flexeril, and prednisone. Please do not take ibuprofen, motrin, or aleve while taking naprosyn. Do not take robaxin while taking flexeril.  You can apply heat or ice to affected areas for 20 minutes increments.  Use the prescribed medication as directed.  Please discuss all new medications with your pharmacist.   Be sure to follow up with your primary provider in the next week for re-evaluation.  You may return to the Emergency Department at any time for worsening condition or any new symptoms that concern you. Return to ED if you develop fever, chest pain, shortness of breath, loss of bowel or bladder function, difficulty walking, numbness or weakness.

## 2016-06-18 NOTE — ED Notes (Signed)
Pt. Ambulated to the restroom at this time with stand-by assist.

## 2016-06-18 NOTE — ED Notes (Signed)
EDP at bedside  

## 2016-06-18 NOTE — Progress Notes (Signed)
[facility] received consult regarding patient does not have insurance and cannot afford follow up.  [facility] faxed over the following infromation to [facility] ED fax [phone]:   [facility] provided patient with contact information to [facility], informed patient of services there and walk in times.  EDCM also provided patient with list of pcps who accept self pay patients, list of discount pharmacies and websites needymeds.org and GoodRX.com for medication assistance, phone number to inquire about the orange card, phone number to inquire about Medicaid, phone number to inquire about the [place], financial resources in the community such as local churches, [employer], urban ministries, and dental assistance for uninsured patients.   No further EDCM needs at this time.  Discussed patient with EDRN Erin.

## 2016-06-18 NOTE — ED Triage Notes (Signed)
States she was in the ED last week for back pain and was given a muscle relaxant ,states it isn't working. States the pain is radiating her right leg

## 2016-06-27 IMAGING — CR DG CHEST 2V
2 series · 2 of 2 positions shown · non-contrast
Comparison: 02/27/2013

CLINICAL DATA: Acute cough

EXAM:
CHEST - 2 VIEW

[chest pa]
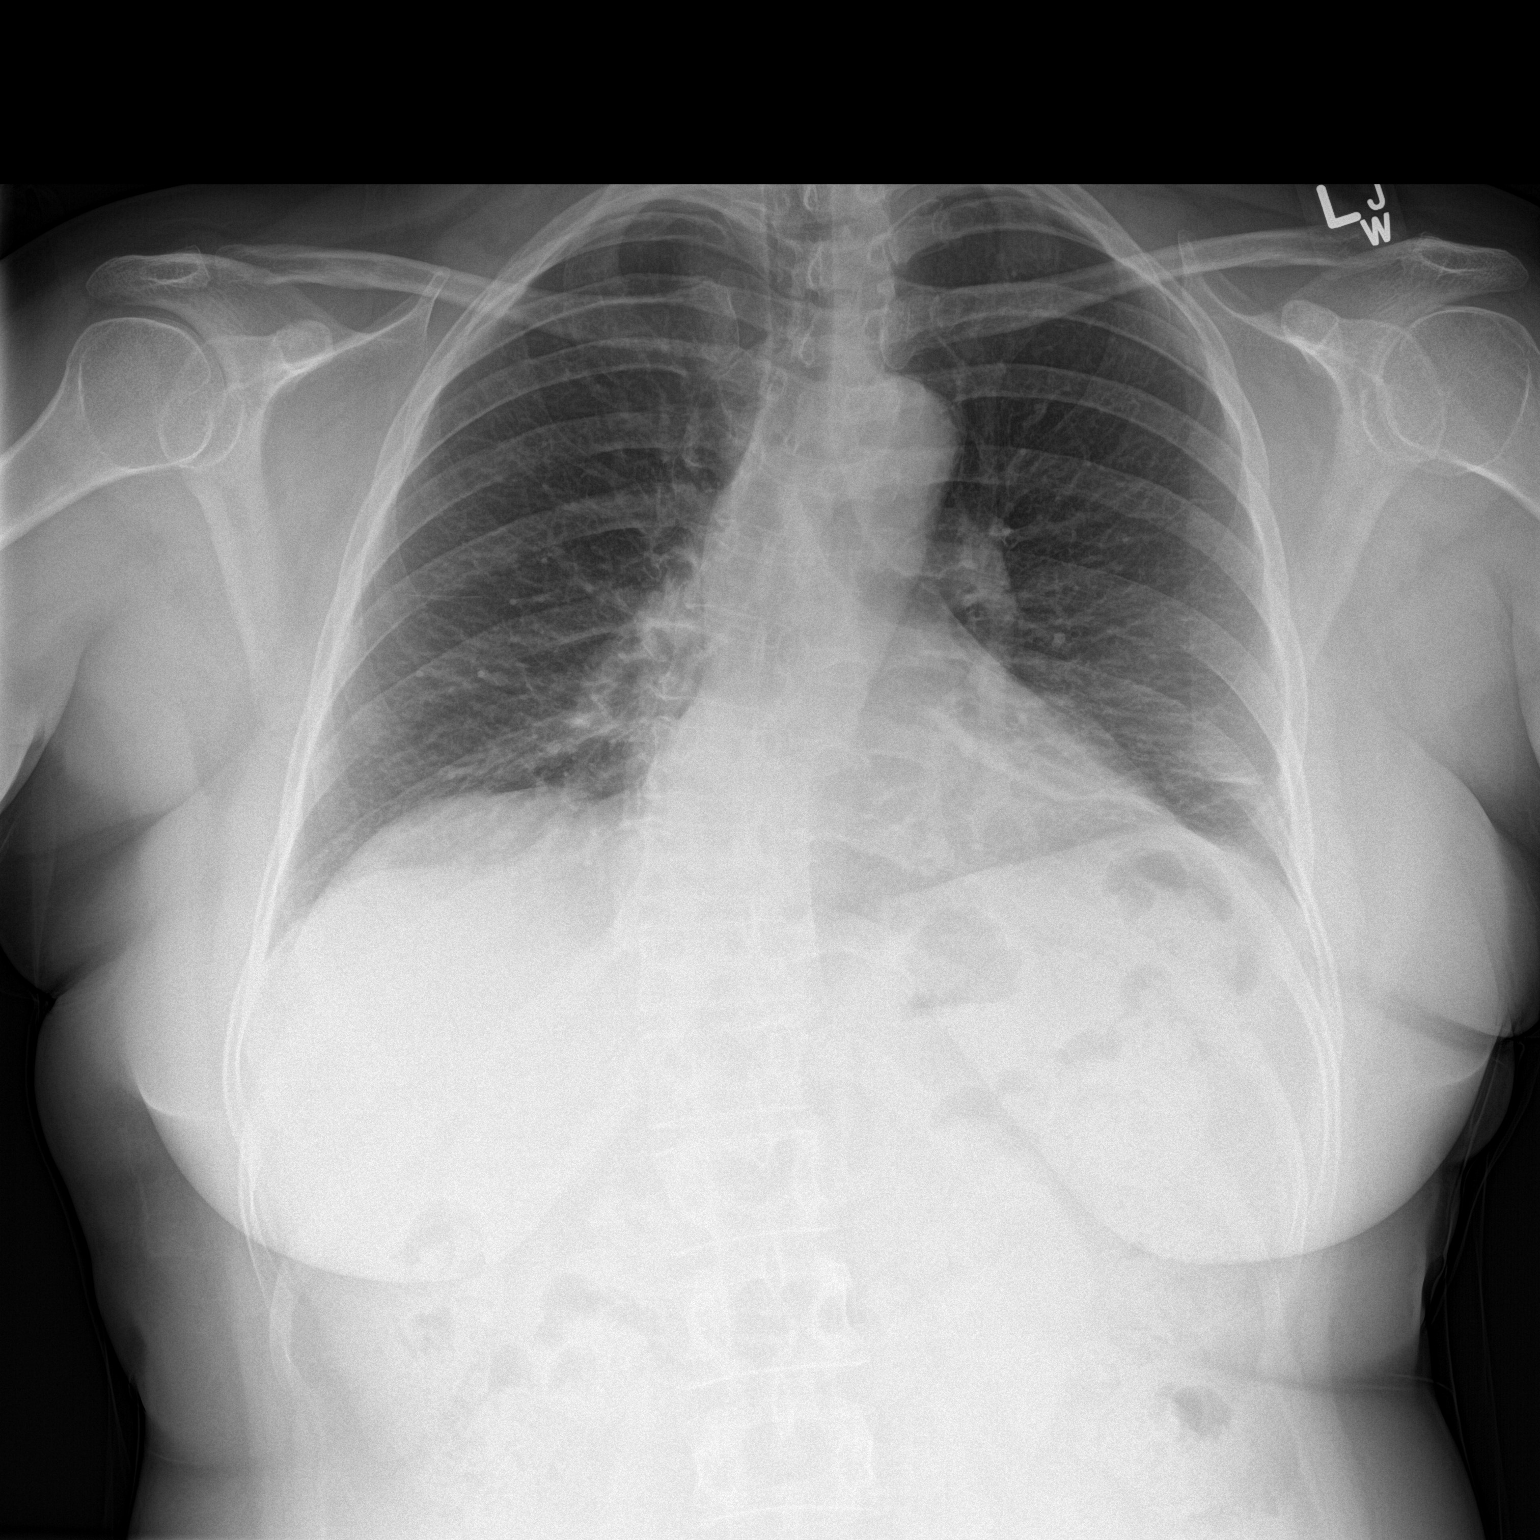

[chest lat]
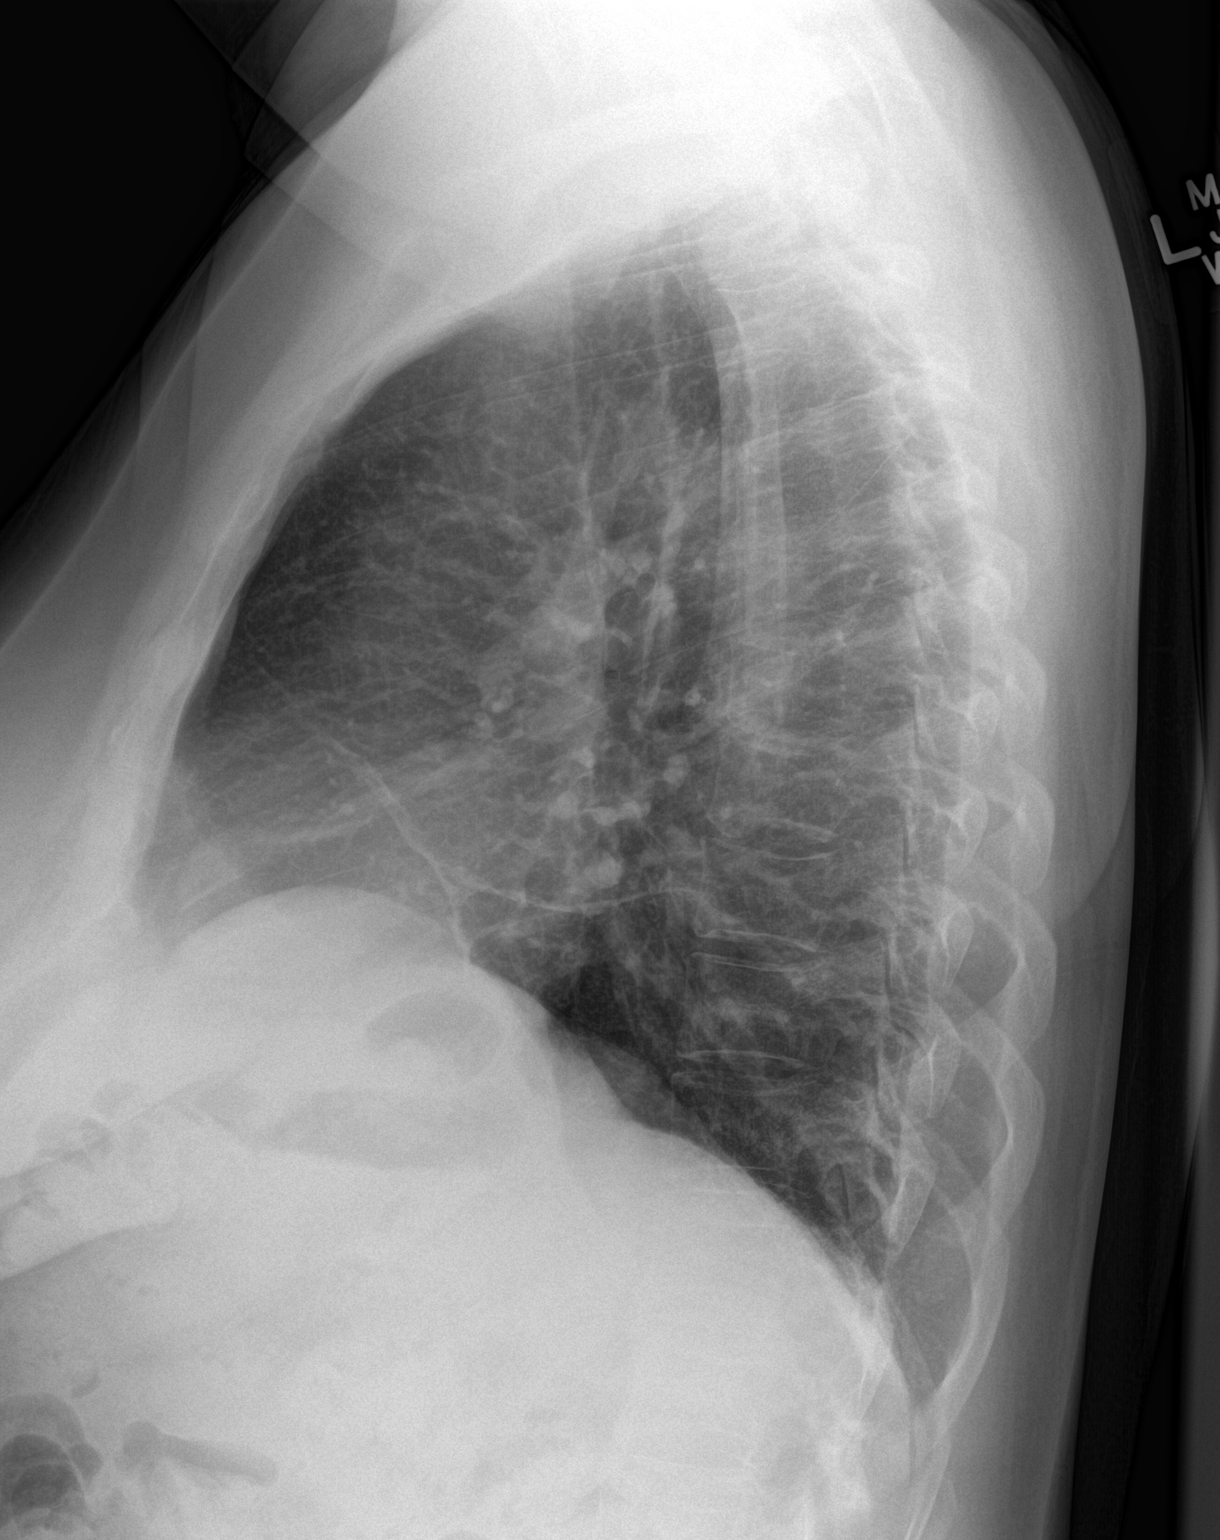

[2 of 2 positions shown; findings below may reference images not displayed]

FINDINGS: Stable scoliosis of the thoracolumbar spine. Lower lung volumes with
increased basilar atelectasis. Normal heart size and vascularity. No
focal pneumonia, collapse or consolidation. Negative for edema,
effusion or pneumothorax. Trachea midline.
IMPRESSION: Low volume exam with increased basilar atelectasis, worse on the
left

## 2016-10-22 ENCOUNTER — Other Ambulatory Visit: Payer: Self-pay | Admitting: Internal Medicine

## 2016-10-22 DIAGNOSIS — Z1231 Encounter for screening mammogram for malignant neoplasm of breast: Secondary | ICD-10-CM

## 2016-11-30 ENCOUNTER — Ambulatory Visit: Payer: Self-pay

## 2017-06-06 ENCOUNTER — Emergency Department (HOSPITAL_COMMUNITY)
Admission: EM | Admit: 2017-06-06 | Discharge: 2017-06-06 | Disposition: A | Discharge status: Home / Self Care | Payer: BLUE CROSS/BLUE SHIELD | Attending: Emergency Medicine | Admitting: Emergency Medicine

## 2017-06-06 ENCOUNTER — Encounter (HOSPITAL_COMMUNITY): Payer: Self-pay | Admitting: Emergency Medicine

## 2017-06-06 ENCOUNTER — Emergency Department (HOSPITAL_COMMUNITY): Payer: BLUE CROSS/BLUE SHIELD

## 2017-06-06 DIAGNOSIS — Y999 Unspecified external cause status: Secondary | ICD-10-CM | POA: Diagnosis not present

## 2017-06-06 DIAGNOSIS — I10 Essential (primary) hypertension: Secondary | ICD-10-CM | POA: Insufficient documentation

## 2017-06-06 DIAGNOSIS — Y93H2 Activity, gardening and landscaping: Secondary | ICD-10-CM | POA: Diagnosis not present

## 2017-06-06 DIAGNOSIS — Y92017 Garden or yard in single-family (private) house as the place of occurrence of the external cause: Secondary | ICD-10-CM | POA: Diagnosis not present

## 2017-06-06 DIAGNOSIS — S62631B Displaced fracture of distal phalanx of left index finger, initial encounter for open fracture: Secondary | ICD-10-CM | POA: Insufficient documentation

## 2017-06-06 DIAGNOSIS — W293XXA Contact with powered garden and outdoor hand tools and machinery, initial encounter: Secondary | ICD-10-CM | POA: Insufficient documentation

## 2017-06-06 DIAGNOSIS — S61311A Laceration without foreign body of left index finger with damage to nail, initial encounter: Secondary | ICD-10-CM

## 2017-06-06 DIAGNOSIS — S6992XA Unspecified injury of left wrist, hand and finger(s), initial encounter: Secondary | ICD-10-CM | POA: Diagnosis present

## 2017-06-06 DIAGNOSIS — Z23 Encounter for immunization: Secondary | ICD-10-CM | POA: Insufficient documentation

## 2017-06-06 DIAGNOSIS — Z79899 Other long term (current) drug therapy: Secondary | ICD-10-CM | POA: Insufficient documentation

## 2017-06-06 DIAGNOSIS — S62639B Displaced fracture of distal phalanx of unspecified finger, initial encounter for open fracture: Secondary | ICD-10-CM

## 2017-06-06 MED ORDER — TETANUS-DIPHTH-ACELL PERTUSSIS 5-2.5-18.5 LF-MCG/0.5 IM SUSP
0.5000 mL | Freq: Once | INTRAMUSCULAR | Status: AC
  Administered 2017-06-06: 0.5 mL via INTRAMUSCULAR
  Filled 2017-06-06: qty 0.5

## 2017-06-06 MED ORDER — HYDROCODONE-ACETAMINOPHEN 5-325 MG PO TABS
1.0000 | ORAL_TABLET | ORAL | 0 refills | Status: DC | PRN
Start: 2017-06-06 — End: 2017-10-14

## 2017-06-06 MED ORDER — BUPIVACAINE HCL 0.5 % IJ SOLN
50.0000 mL | Freq: Once | INTRAMUSCULAR | Status: AC
  Administered 2017-06-06: 50 mL
  Filled 2017-06-06: qty 50

## 2017-06-06 MED ORDER — CEPHALEXIN 500 MG PO CAPS: 500.0000 mg | ORAL_CAPSULE | Freq: Four times a day (QID) | ORAL | 0 refills | Status: AC

## 2017-06-06 NOTE — ED Triage Notes (Signed)
Patient here via EMS from home with complaints of left index finger laceration after cutting bushes with hedge trimmers. 172mcg Fent given IV in route.

## 2017-06-06 NOTE — ED Notes (Signed)
Bed: WTR9 Expected date:  Expected time:  Means of arrival:  Comments: 

## 2017-06-06 NOTE — ED Provider Notes (Signed)
[facility] DEPT Provider Note   CSN: [id number] Arrival date & time: 06/06/17  1141     History   Chief Complaint Chief Complaint  Patient presents with  . Finger Injury    HPI  Lisa Casey is a 56 y.o. Female presents to the ED via EMS with a laceration to the left index finger. Patient reports she was trimming the hedges at her house and caught her left index finger with the blade. Patient reports initially it was bleeding, but bleeding is now controlled. Patient received 170mcg of fentanyl in transit, but reports pain is returning she describes it constant throbbing.  Patient is unsure when her last tetanus vaccine was.        Past Medical History:  Diagnosis Date  . Anxiety    HX OF ANXIETY ATTACKS  . Back pain, chronic   . Fibromyalgia    PT ON LYRIC   . GERD (gastroesophageal reflux disease)    NOT ON ANY MEDS AT PRESENT  . Headache(784.0)    HX OF MIGRAINES  . Hypertension    HX OF ELEVATED B/P FOR SEVERAL YRS--NO MEDS UNTIL LAST WEEK--PT WAS C/O OF CHEST PRESSURE AND B/P ELEVATED--PT SAW NURSE PRACTIONER ON 08/21/11 AND STATES EKG WAS DONE--TOLD ABNORMAL--PUT ON B/P MEDICATION LISINOPRIL AND TOLD SHE WAS TO SEE A CARDIOLOGIST.  [name] Shortness of breath    WITH EXERTION--NEW FOR PT--PT ALSO REPORTS FEELING TIRED    Patient Active Problem List   Diagnosis Date Noted  . Leiomyoma of uterus, unspecified 09/14/2011    Past Surgical History:  Procedure Laterality Date  . CONSTIPATION    . CYSTOSCOPY W/ URETERAL STENT PLACEMENT  09/23/2012   Procedure: CYSTOSCOPY WITH RETROGRADE PYELOGRAM/URETERAL STENT PLACEMENT;  Surgeon: Alexis Frock, MD;  Location: [facility];  Service: Urology;  Laterality: Right;  . CYSTOSCOPY WITH RETROGRADE PYELOGRAM, URETEROSCOPY AND STENT PLACEMENT  10/08/2012   Procedure: CYSTOSCOPY WITH RETROGRADE PYELOGRAM, URETEROSCOPY AND STENT PLACEMENT;  Surgeon: Alexis Frock, MD;  Location: [facility];  Service: Urology;   Laterality: Right;  90 MIN NO RETROGRADE NEEDS FLEX URETEROSCOPE   . HOLMIUM LASER APPLICATION  12/24/8117   Procedure: HOLMIUM LASER APPLICATION;  Surgeon: Alexis Frock, MD;  Location: [facility];  Service: Urology;  Laterality: Right;  . LEFT HEART CATHETERIZATION WITH CORONARY ANGIOGRAM N/A 10/29/2011   Procedure: LEFT HEART CATHETERIZATION WITH CORONARY ANGIOGRAM;  Surgeon: Leonie Man, MD;  Location: [facility] CATH LAB;  Service: Cardiovascular;  Laterality: N/A;  . ROBOTIC ASSISTED LAP VAGINAL HYSTERECTOMY  09/14/2011   Procedure: ROBOTIC ASSISTED LAPAROSCOPIC VAGINAL HYSTERECTOMY;  Surgeon: Agnes Lawrence, MD;  Location: [facility];  Service: Gynecology;  Laterality: N/A;  . TUBAL LIGATION      OB History    No data available       Home Medications    Prior to Admission medications   Medication Sig Start Date End Date Taking? Authorizing Provider  albuterol (PROVENTIL HFA;VENTOLIN HFA) 108 (90 BASE) MCG/ACT inhaler Inhale 1-2 puffs into the lungs every 4 (four) hours as needed for wheezing or shortness of breath (and or cough). Patient not taking: Reported on 06/18/2016 08/06/14   Schorr, Rhetta Mura, NP  azithromycin (ZITHROMAX Z-PAK) 250 MG tablet Take as directed on pack Patient not taking: Reported on 06/18/2016 03/12/15   Billy Fischer, MD  cyclobenzaprine (FLEXERIL) 5 MG tablet Take 2 tablets (10 mg total) by mouth 3 (three) times daily as needed for muscle spasms. 06/18/16   Bjorn Loser,  Eddie North, PA-C  diclofenac (CATAFLAM) 50 MG tablet Take 1 tablet (50 mg total) by mouth 3 (three) times daily. For chest pains Patient not taking: Reported on 06/18/2016 03/12/15   Billy Fischer, MD  estrogens, conjugated, (PREMARIN) 0.625 MG tablet Take 1 tablet (0.625 mg total) by mouth daily. Patient not taking: Reported on 06/18/2016 10/06/14   Shelly Bombard, MD  hydrOXYzine (VISTARIL) 50 MG capsule Take 50 mg by mouth daily as needed for anxiety. 04/13/16   [provider]  naproxen (NAPROSYN) 500 MG tablet Take 1 tablet (500 mg total) by mouth 2 (two) times daily. 06/18/16   Frederica Kuster, PA-C  PARoxetine (PAXIL) 20 MG tablet Take 20 mg by mouth daily. 05/23/16   [provider]  predniSONE (DELTASONE) 20 MG tablet Take 2 tablets (40 mg total) by mouth daily. 06/18/16   Frederica Kuster, PA-C  pregabalin (LYRICA) 50 MG capsule Take 150 mg by mouth 2 (two) times daily.    [provider]  traZODone (DESYREL) 50 MG tablet Take 50 mg by mouth at bedtime as needed for sleep. 05/23/16   [provider]    Family History No family history on file.  Social History Social History  Substance Use Topics  . Smoking status: Never Smoker  . Smokeless tobacco: Never Used  . Alcohol use No     Allergies   Codeine   Review of Systems Review of Systems  Constitutional: Negative for chills and fever.  Musculoskeletal:       Left index finger pain     Physical Exam Updated Vital Signs LMP 08/18/2011   Physical Exam  Constitutional: She appears well-developed and well-nourished. No distress.  HENT:  Head: Normocephalic and atraumatic.  Eyes: Right eye exhibits no discharge. Left eye exhibits no discharge.  Pulmonary/Chest: Effort normal. No respiratory distress.  Musculoskeletal:  Laceration to radial aspect of distal left index finger with crack in nail that does not extend to the nail bed (pictured below). Good strength with extension and flexion at DIP and PIP joint, sensation intact. Very small superficial cut to radial aspect of left middle finger at PIP joint.  Neurological: She is alert. Coordination normal.  Skin: Skin is warm and dry. Capillary refill takes less than 2 seconds. She is not diaphoretic.  Psychiatric: She has a normal mood and affect. Her behavior is normal.  Nursing note and vitals reviewed.          ED Treatments / Results  Labs (all labs ordered are listed, but only abnormal results  are displayed) Labs Reviewed - No data to display  EKG  EKG Interpretation None       Radiology Dg Finger Index Left  Result Date: 06/06/2017 CLINICAL DATA:  Left index finger laceration. EXAM: LEFT INDEX FINGER 2+V COMPARISON:  None. FINDINGS: Soft tissue laceration of the distal index finger with adjacent essentially nondisplaced fracture through the radial aspect of the distal phalanx. Joint spaces are preserved. No radiopaque foreign body. IMPRESSION: 1. Soft tissue laceration of the distal index finger with adjacent fracture through the radial aspect of the distal phalanx. 2. No radiopaque foreign body. Electronically Signed   By: Titus Dubin M.D.   On: 06/06/2017 12:37    Procedures .Marland KitchenLaceration Repair Date/Time: 06/06/2017 2:40 PM Performed by: Jacqlyn Larsen Authorized by: Jacqlyn Larsen   Consent:    Consent obtained:  Verbal   Consent given by:  Patient   Risks discussed:  Infection, pain  and need for additional repair Anesthesia (see MAR for exact dosages):    Anesthesia method:  Nerve block   Block location:  Digital   Block needle gauge:  25 G   Block anesthetic:  Bupivacaine 0.5% WITH epi   Block injection procedure:  Introduced needle   Block outcome:  Anesthesia achieved Laceration details:    Location:  Finger   Finger location:  L index finger   Length (cm):  3 Repair type:    Repair type:  Intermediate Pre-procedure details:    Preparation:  Patient was prepped and draped in usual sterile fashion and imaging obtained to evaluate for foreign bodies Exploration:    Hemostasis achieved with:  Epinephrine and direct pressure   Wound exploration: wound explored through full range of motion and entire depth of wound probed and visualized     Wound extent: underlying fracture     Contaminated: yes   Treatment:    Area cleansed with:  Saline   Amount of cleaning:  Extensive   Irrigation solution:  Sterile saline   Irrigation volume:  1L   Irrigation  method:  Pressure wash Skin repair:    Repair method:  Sutures   Suture size:  4-0   Suture technique:  Simple interrupted Approximation:    Approximation:  Close Post-procedure details:    Dressing:  Splint for protection and non-adherent dressing   Patient tolerance of procedure:  Tolerated well, no immediate complications   (including critical care time)  Medications Ordered in ED Medications  bupivacaine (MARCAINE) 0.5 % (with pres) injection 50 mL (50 mLs Infiltration Given 06/06/17 1256)  Tdap (BOOSTRIX) injection 0.5 mL (0.5 mLs Intramuscular Given 06/06/17 1450)     Initial Impression / Assessment and Plan / ED Course  I have reviewed the triage vital signs and the nursing notes.  Pertinent labs & imaging results that were available during my care of the patient were reviewed by me and considered in my medical decision making (see chart for details).  Patient presents with laceration to the radial aspect of left index finger with associated tuft fracture. Good anesthesia was achieved with marcaine digital block, the wound was thoroughly irrigated and explored. Finger neurovascularly intact with good strength with extension and flexion at the DIP. Laceration was repaired with simple interrupted sutures with close approximation, a dressing and splint was applied. Patient tolerated the procedure well with no immediate complications. Patient discharged home with antibiotics and pain medication. Tetanus updated in the ED. Patient to follow up with Dr. Fredna Dow with hand surgery, return precautions provided. Patient expressed understanding and agrees with plan.  Final Clinical Impressions(s) / ED Diagnoses   Final diagnoses:  Laceration of left index finger without foreign body with damage to nail, initial encounter  Open fracture of tuft of distal phalanx of finger    New Prescriptions Discharge Medication List as of 06/06/2017  2:57 PM    START taking these medications   Details    cephALEXin (KEFLEX) 500 MG capsule Take 1 capsule (500 mg total) by mouth 4 (four) times daily., Starting Thu 06/06/2017, Until Tue 06/11/2017, Print    HYDROcodone-acetaminophen (NORCO) 5-325 MG tablet Take 1 tablet by mouth every 4 (four) hours as needed., Starting Thu 06/06/2017, Print         Jacqlyn Larsen, PA-C 06/06/17 2356    Dorie Rank, MD 06/07/17 [phone]

## 2017-06-06 NOTE — Discharge Instructions (Signed)
Keep wound clean and dry until you're seen and provided more instructions by Dr. Fredna Dow with hand surgery. Complete entire course of antibiotics as directed. You may take Norco for pain as needed, he may also take ibuprofen, do not combine with any other over-the-counter pain medications. If you developed fever, chills, pain becomes exponentially worse please return to the emergency department for reevaluation.

## 2017-10-14 ENCOUNTER — Encounter (HOSPITAL_COMMUNITY): Payer: Self-pay | Admitting: Family Medicine

## 2017-10-14 ENCOUNTER — Ambulatory Visit (HOSPITAL_COMMUNITY)
Admission: EM | Admit: 2017-10-14 | Discharge: 2017-10-14 | Disposition: A | Discharge status: Home / Self Care | Payer: BLUE CROSS/BLUE SHIELD | Attending: Family Medicine | Admitting: Family Medicine

## 2017-10-14 DIAGNOSIS — G44209 Tension-type headache, unspecified, not intractable: Secondary | ICD-10-CM | POA: Diagnosis not present

## 2017-10-14 MED ORDER — BUTALBITAL-APAP-CAFFEINE 50-325-40 MG PO TABS: 1.0000 | ORAL_TABLET | Freq: Four times a day (QID) | ORAL | 0 refills | Status: AC | PRN

## 2017-10-14 NOTE — ED Triage Notes (Signed)
PT reports headache with facial pain and eye soreness. PT denies congestion, endorses SOB. PT has taken lyrica. PT has history of migraines, but reports this feels different.

## 2017-10-14 NOTE — Discharge Instructions (Signed)
I recommend you stop by your primary care provider's office to see what the problem is with their communication

## 2017-10-14 NOTE — ED Provider Notes (Signed)
[facility]   [id number] 10/14/17 Arrival Time: 0955   SUBJECTIVE:  Lisa Casey is a 57 y.o. female who presents to the urgent care with complaint of headache.  Patient has h/o migraines, but thinks this is different.  The headache began 1 week ago.  It started after she developed sudden low back pain while walking down [address] taking her mother to chemotherapy.  The headache is described as frontal and involving the scalp which is tender.  Headache is steady.  There is no nausea, or vomiting, or diplopia, or change in hearing, or change in motor function of arms or legs.  She has had no trauma.  There is been no fever or symptoms of an upper respiratory nature.  Her low back continues to bother her.  She went for a chiropractic adjustment last week which did not help.  She also tried to contact her primary care provider [place] Chard but she has been unable to get a return call or talk to anybody there.      Past Medical History:  Diagnosis Date  . Anxiety    HX OF ANXIETY ATTACKS  . Back pain, chronic   . Fibromyalgia    PT ON LYRIC   . GERD (gastroesophageal reflux disease)    NOT ON ANY MEDS AT PRESENT  . Headache(784.0)    HX OF MIGRAINES  . Hypertension    HX OF ELEVATED B/P FOR SEVERAL YRS--NO MEDS UNTIL LAST WEEK--PT WAS C/O OF CHEST PRESSURE AND B/P ELEVATED--PT SAW NURSE PRACTIONER ON 08/21/11 AND STATES EKG WAS DONE--TOLD ABNORMAL--PUT ON B/P MEDICATION LISINOPRIL AND TOLD SHE WAS TO SEE A CARDIOLOGIST.  [name] Shortness of breath    WITH EXERTION--NEW FOR PT--PT ALSO REPORTS FEELING TIRED   No family history on file. Social History   Socioeconomic History  . Marital status: Legally Separated    Spouse name: Not on file  . Number of children: Not on file  . Years of education: Not on file  . Highest education level: Not on file  Social Needs  . Financial resource strain: Not on file  . Food insecurity - worry: Not on file  . Food insecurity -  inability: Not on file  . Transportation needs - medical: Not on file  . Transportation needs - non-medical: Not on file  Occupational History  . Not on file  Tobacco Use  . Smoking status: Never Smoker  . Smokeless tobacco: Never Used  Substance and Sexual Activity  . Alcohol use: No  . Drug use: No  . Sexual activity: Yes    Birth control/protection: Other-see comments    Comment: hysterectomy  Other Topics Concern  . Not on file  Social History Narrative  . Not on file   Current Meds  Medication Sig  . pregabalin (LYRICA) 50 MG capsule Take 150 mg by mouth 2 (two) times daily.  . [DISCONTINUED] cyclobenzaprine (FLEXERIL) 5 MG tablet Take 2 tablets (10 mg total) by mouth 3 (three) times daily as needed for muscle spasms.   Allergies  Allergen Reactions  . Codeine Itching    With rash      ROS: As per HPI, remainder of ROS negative.   OBJECTIVE:   Vitals:   10/14/17 1010 10/14/17 1011  BP: 104/73   Pulse: 98   Resp: 16   Temp: 99.4 F (37.4 C)   TempSrc: Oral   SpO2: 96%   Weight:  200 lb (90.7 kg)  Height:  5\' 5"  (1.651  m)     General appearance: alert; no distress Eyes: PERRL; EOMI; conjunctiva normal; fundi are normal HENT: normocephalic; atraumatic; TMs normal, canal normal, external ears normal without trauma; nasal mucosa normal; oral mucosa normal Neck: supple Lungs: clear to auscultation bilaterally Heart: regular rate and rhythm Back: no CVA tenderness Extremities: no cyanosis or edema; symmetrical with no gross deformities Skin: warm and dry Neurologic: normal gait; grossly normal Psychological: alert and cooperative; normal mood and affect      Labs:  Results for orders placed or performed during the hospital encounter of 06/08/16  Urine culture  Result Value Ref Range   Specimen Description URINE, CLEAN CATCH    Special Requests NONE    Culture (A)     50,000 COLONIES/mL LACTOBACILLUS SPECIES Standardized susceptibility testing  for this organism is not available.    Report Status 06/09/2016 FINAL   Urinalysis, Routine w reflex microscopic (not at [facility])  Result Value Ref Range   Color, Urine YELLOW YELLOW   APPearance HAZY (A) CLEAR   Specific Gravity, Urine 1.026 1.005 - 1.030   pH 6.0 5.0 - 8.0   Glucose, UA NEGATIVE NEGATIVE mg/dL   Hgb urine dipstick NEGATIVE NEGATIVE   Bilirubin Urine NEGATIVE NEGATIVE   Ketones, ur NEGATIVE NEGATIVE mg/dL   Protein, ur NEGATIVE NEGATIVE mg/dL   Nitrite NEGATIVE NEGATIVE   Leukocytes, UA SMALL (A) NEGATIVE  Urine microscopic-add on  Result Value Ref Range   Squamous Epithelial / LPF 6-30 (A) NONE SEEN   WBC, UA 6-30 0 - 5 WBC/hpf   RBC / HPF NONE SEEN 0 - 5 RBC/hpf   Bacteria, UA MANY (A) NONE SEEN    Labs Reviewed - No data to display  No results found.     ASSESSMENT & PLAN:  1. Tension headache     Meds ordered this encounter  Medications  . butalbital-acetaminophen-caffeine (FIORICET, ESGIC) 50-325-40 MG tablet    Sig: Take 1-2 tablets by mouth every 6 (six) hours as needed for headache.    Dispense:  20 tablet    Refill:  0    Reviewed expectations re: course of current medical issues. Questions answered. Outlined signs and symptoms indicating need for more acute intervention. Patient verbalized understanding. After Visit Summary given.    Procedures:      Robyn Haber, MD 10/14/17 1028

## 2018-06-17 ENCOUNTER — Other Ambulatory Visit: Payer: Self-pay | Admitting: Nurse Practitioner

## 2018-07-22 ENCOUNTER — Other Ambulatory Visit: Payer: Self-pay

## 2018-07-22 ENCOUNTER — Emergency Department (HOSPITAL_COMMUNITY): Payer: BLUE CROSS/BLUE SHIELD

## 2018-07-22 ENCOUNTER — Emergency Department (HOSPITAL_COMMUNITY)
Admission: EM | Admit: 2018-07-22 | Discharge: 2018-07-22 | Disposition: A | Discharge status: Home / Self Care | Payer: BLUE CROSS/BLUE SHIELD | Attending: Emergency Medicine | Admitting: Emergency Medicine

## 2018-07-22 ENCOUNTER — Telehealth: Payer: Self-pay

## 2018-07-22 ENCOUNTER — Encounter (HOSPITAL_COMMUNITY): Payer: Self-pay | Admitting: Emergency Medicine

## 2018-07-22 DIAGNOSIS — R0789 Other chest pain: Secondary | ICD-10-CM

## 2018-07-22 DIAGNOSIS — I10 Essential (primary) hypertension: Secondary | ICD-10-CM | POA: Insufficient documentation

## 2018-07-22 DIAGNOSIS — F419 Anxiety disorder, unspecified: Secondary | ICD-10-CM

## 2018-07-22 DIAGNOSIS — Z79899 Other long term (current) drug therapy: Secondary | ICD-10-CM | POA: Insufficient documentation

## 2018-07-22 LAB — BASIC METABOLIC PANEL
ANION GAP: 9 (ref 5–15)
BUN: 23 mg/dL — ABNORMAL HIGH (ref 6–20)
CO2: 27 mmol/L (ref 22–32)
Calcium: 9.3 mg/dL (ref 8.9–10.3)
Chloride: 104 mmol/L (ref 98–111)
Creatinine, Ser: 0.76 mg/dL (ref 0.44–1.00)
GFR calc non Af Amer: >60 mL/min (ref >60)
Glucose, Bld: 120 mg/dL — ABNORMAL HIGH (ref 70–99)
Potassium: 3.4 mmol/L — ABNORMAL LOW (ref 3.5–5.1)
Sodium: 140 mmol/L (ref 135–145)

## 2018-07-22 LAB — CBC WITH DIFFERENTIAL/PLATELET
Abs Immature Granulocytes: 0 10*3/uL (ref 0.00–0.07)
BASOS ABS: 0 10*3/uL (ref 0.0–0.1)
Basophils Relative: 1 %
EOS ABS: 0 10*3/uL (ref 0.0–0.5)
EOS PCT: 1 %
HEMATOCRIT: 40.8 % (ref 36.0–46.0)
HEMOGLOBIN: 13.3 g/dL (ref 12.0–15.0)
IMMATURE GRANULOCYTES: 0 %
LYMPHS ABS: 2.1 10*3/uL (ref 0.7–4.0)
LYMPHS PCT: 53 %
MCH: 30.4 pg (ref 26.0–34.0)
MCHC: 32.6 g/dL (ref 30.0–36.0)
MCV: 93.2 fL (ref 80.0–100.0)
MONOS PCT: 9 %
Monocytes Absolute: 0.4 10*3/uL (ref 0.1–1.0)
NRBC: 0 % (ref 0.0–0.2)
Neutro Abs: 1.4 10*3/uL — ABNORMAL LOW (ref 1.7–7.7)
Neutrophils Relative %: 36 %
Platelets: 230 10*3/uL (ref 150–400)
RBC: 4.38 MIL/uL (ref 3.87–5.11)
RDW: 11.7 % (ref 11.5–15.5)
WBC: 4 10*3/uL (ref 4.0–10.5)

## 2018-07-22 LAB — I-STAT TROPONIN, ED
TROPONIN I, POC: 0 ng/mL (ref 0.00–0.08)
Troponin i, poc: 0 ng/mL (ref 0.00–0.08)

## 2018-07-22 MED ORDER — HYDROXYZINE HCL 25 MG PO TABS
25.0000 mg | ORAL_TABLET | Freq: Once | ORAL | Status: DC
  Filled 2018-07-22: qty 1

## 2018-07-22 MED ORDER — FENTANYL CITRATE (PF) 100 MCG/2ML IJ SOLN
50.0000 ug | Freq: Once | INTRAMUSCULAR | Status: AC
  Administered 2018-07-22: 50 ug via INTRAVENOUS
  Filled 2018-07-22: qty 2

## 2018-07-22 MED ORDER — ONDANSETRON HCL 4 MG/2ML IJ SOLN
4.0000 mg | Freq: Once | INTRAMUSCULAR | Status: AC
  Administered 2018-07-22: 4 mg via INTRAVENOUS
  Filled 2018-07-22: qty 2

## 2018-07-22 MED ORDER — ALUM & MAG HYDROXIDE-SIMETH 200-200-20 MG/5ML PO SUSP
15.0000 mL | Freq: Once | ORAL | Status: AC
  Administered 2018-07-22: 15 mL via ORAL
  Filled 2018-07-22: qty 30

## 2018-07-22 MED ORDER — HYDROXYZINE HCL 25 MG PO TABS
25.0000 mg | ORAL_TABLET | Freq: Once | ORAL | Status: AC
  Administered 2018-07-22: 25 mg via ORAL
  Filled 2018-07-22: qty 1

## 2018-07-22 MED ORDER — HYDROXYZINE HCL 25 MG PO TABS
25.0000 mg | ORAL_TABLET | Freq: Once | ORAL | Status: AC
  Administered 2018-07-22: 25 mg via ORAL

## 2018-07-22 MED ORDER — HYDROXYZINE HCL 25 MG PO TABS: 25.0000 mg | ORAL_TABLET | Freq: Four times a day (QID) | ORAL | 0 refills | Status: DC

## 2018-07-22 NOTE — ED Triage Notes (Signed)
Pt presents to ED with left sided chest pain that has been present for 2 days. Pt reports the pain as a "pressure", also reports N/V and headache. Pt A&O x4. Hx of hypertension, taking lisinopril for such.

## 2018-07-22 NOTE — ED Notes (Signed)
Patient given discharge teaching and verbalized understanding. Patient ambulated out of ED with a steady gait. 

## 2018-07-22 NOTE — Discharge Instructions (Signed)
Please read instructions below. Follow up with your primary care provider. You can take hydroxyzine or Benadryl every 6 hours as needed for anxiety.  Return to the emergency department if you begin having thoughts or feelings of harming herself or ending your life.  Return for new or worsening symptoms; including worsening chest pain, shortness of breath, pain that radiates to the arm or neck, pain or shortness of breath worsened with exertion.

## 2018-07-22 NOTE — ED Provider Notes (Signed)
[facility] DEPT Provider Note   CSN: [id number] Arrival date & time: 07/22/18  0557     History   Chief Complaint Chief Complaint  Patient presents with  . Chest Pain    HPI Lisa Casey is a 57 y.o. female w past medical history of hypertension, GERD, fibromyalgia, anxiety, migraines, presenting to the emergency department with multiple days of left-sided chest pain.  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  She states she has had intermittent pain since that time however became constant and worse last night.  Chest pain is associated with nausea, vomiting, and intermittent shortness of breath.  She also complains of headache which feels typical of her history of migraines, described as frontal, throbbing, and intermittent since last week.  She denies diaphoresis, radiation of chest pain, vision changes, cough, hemoptysis, unilateral leg swelling, exogenous estrogen use.  No cardiac history.  No history of DVT/PE.  Chart review, patient had normal cardiac catheterization in 2013.  The history is provided by the patient and medical records.    Past Medical History:  Diagnosis Date  . Anxiety    HX OF ANXIETY ATTACKS  . Back pain, chronic   . Fibromyalgia    PT ON LYRIC   . GERD (gastroesophageal reflux disease)    NOT ON ANY MEDS AT PRESENT  . Headache(784.0)    HX OF MIGRAINES  . Hypertension    HX OF ELEVATED B/P FOR SEVERAL YRS--NO MEDS UNTIL LAST WEEK--PT WAS C/O OF CHEST PRESSURE AND B/P ELEVATED--PT SAW NURSE PRACTIONER ON 08/21/11 AND STATES EKG WAS DONE--TOLD ABNORMAL--PUT ON B/P MEDICATION LISINOPRIL AND TOLD SHE WAS TO SEE A CARDIOLOGIST.  [name] Shortness of breath    WITH EXERTION--NEW FOR PT--PT ALSO REPORTS FEELING TIRED    Patient Active Problem List   Diagnosis Date Noted  . Leiomyoma of uterus, unspecified 09/14/2011    Past  Surgical History:  Procedure Laterality Date  . CONSTIPATION    . CYSTOSCOPY W/ URETERAL STENT PLACEMENT  09/23/2012   Procedure: CYSTOSCOPY WITH RETROGRADE PYELOGRAM/URETERAL STENT PLACEMENT;  Surgeon: Alexis Frock, MD;  Location: [facility];  Service: Urology;  Laterality: Right;  . CYSTOSCOPY WITH RETROGRADE PYELOGRAM, URETEROSCOPY AND STENT PLACEMENT  10/08/2012   Procedure: CYSTOSCOPY WITH RETROGRADE PYELOGRAM, URETEROSCOPY AND STENT PLACEMENT;  Surgeon: Alexis Frock, MD;  Location: [facility];  Service: Urology;  Laterality: Right;  90 MIN NO RETROGRADE NEEDS FLEX URETEROSCOPE   . HOLMIUM LASER APPLICATION  7/56/4332   Procedure: HOLMIUM LASER APPLICATION;  Surgeon: Alexis Frock, MD;  Location: [facility];  Service: Urology;  Laterality: Right;  . LEFT HEART CATHETERIZATION WITH CORONARY ANGIOGRAM N/A 10/29/2011   Procedure: LEFT HEART CATHETERIZATION WITH CORONARY ANGIOGRAM;  Surgeon: Leonie Man, MD;  Location: [facility] CATH [facility];  Service: Cardiovascular;  Laterality: N/A;  . ROBOTIC ASSISTED LAP VAGINAL HYSTERECTOMY  09/14/2011   Procedure: ROBOTIC ASSISTED LAPAROSCOPIC VAGINAL HYSTERECTOMY;  Surgeon: Agnes Lawrence, MD;  Location: [facility];  Service: Gynecology;  Laterality: N/A;  . TUBAL LIGATION       OB History   None      Home Medications    Prior to Admission medications   Medication Sig Start Date End Date Taking? Authorizing Provider  butalbital-acetaminophen-caffeine (FIORICET, ESGIC) [phone] MG tablet Take 1-2 tablets by mouth every 6 (six) hours as needed for headache. 10/14/17 10/14/18 Yes Lauenstein, Synetta Shadow,  MD  DULoxetine (CYMBALTA) 60 MG capsule Take 60 mg by mouth daily.   Yes [provider]  lisinopril-hydrochlorothiazide (PRINZIDE,ZESTORETIC) 10-12.5 MG tablet Take 1 tablet by mouth daily. 07/05/18  Yes [provider]  Melatonin 10 MG SUBL Place 30 mg under the tongue at bedtime as needed (sleep).   Yes  [provider]  OVER THE COUNTER MEDICATION Take 2 capsules by mouth daily. Amberen Menopause Relief   Yes [provider]  pregabalin (LYRICA) 50 MG capsule Take 150 mg by mouth 2 (two) times daily.   Yes [provider]  hydrOXYzine (ATARAX/VISTARIL) 25 MG tablet Take 1 tablet (25 mg total) by mouth every 6 (six) hours. 07/22/18   Kerianne Gurr, [place] N, PA-C    Family History No family history on file.  Social History Social History   Tobacco Use  . Smoking status: Never Smoker  . Smokeless tobacco: Never Used  Substance Use Topics  . Alcohol use: No  . Drug use: No     Allergies   Codeine   Review of Systems Review of Systems  Constitutional: Negative for fever.  Eyes: Negative for visual disturbance.  Respiratory: Positive for shortness of breath. Negative for cough.   Cardiovascular: Positive for chest pain. Negative for palpitations and leg swelling.  Gastrointestinal: Positive for nausea and vomiting.  Neurological: Positive for headaches.  Psychiatric/Behavioral: The patient is nervous/anxious.   All other systems reviewed and are negative.    Physical Exam Updated Vital Signs BP 117/66   Pulse (!) 58   Temp 98.3 F (36.8 C)   Resp 20   Ht 5\' 5"  (1.651 m)   Wt 90.7 kg   LMP 08/18/2011   SpO2 100%   BMI 33.28 kg/m   Physical Exam  Constitutional: She is oriented to person, place, and time. She appears well-developed and well-nourished.  Patient appears somewhat anxious and tearful on exam.  HENT:  Head: Normocephalic and atraumatic.  Eyes: Pupils are equal, round, and reactive to light. Conjunctivae and EOM are normal.  Neck: Normal range of motion. Neck supple. No JVD present. No tracheal deviation present.  Cardiovascular: Normal rate, regular rhythm, normal heart sounds and intact distal pulses.  Pulmonary/Chest: Effort normal and breath sounds normal. No stridor. No respiratory distress. She has no wheezes. She has no  rales. She exhibits no tenderness.  Abdominal: Soft. Bowel sounds are normal. She exhibits no distension. There is no tenderness. There is no rebound and no guarding.  Musculoskeletal:  Mild equal b/l LE swelling. No tenderness  Lymphadenopathy:    She has no cervical adenopathy.  Neurological: She is alert and oriented to person, place, and time. No cranial nerve deficit.  Skin: Skin is warm.  Psychiatric: Her behavior is normal.  Nursing note and vitals reviewed.    ED Treatments / Results  Labs (all labs ordered are listed, but only abnormal results are displayed) Labs Reviewed  CBC WITH DIFFERENTIAL/PLATELET - Abnormal; Notable for the following components:      Result Value   Neutro Abs 1.4 (*)    All other components within normal limits  BASIC METABOLIC PANEL - Abnormal; Notable for the following components:   Potassium 3.4 (*)    Glucose, Bld 120 (*)    BUN 23 (*)    All other components within normal limits  I-STAT TROPONIN, ED  I-STAT TROPONIN, ED    EKG EKG Interpretation  Date/Time:  Tuesday July 22 2018 10:30:30 EST Ventricular Rate:  58 PR Interval:  QRS Duration: 80 QT Interval:  466 QTC Calculation: 458 R Axis:   55 Text Interpretation:  Sinus rhythm Short PR interval No acute changes Confirmed by Jola Schmidt [phone]) on 07/22/2018 10:55:28 AM   Radiology Dg Chest 2 View  Result Date: 07/22/2018 CLINICAL DATA:  Chest pain. EXAM: CHEST - 2 VIEW COMPARISON:  Radiographs of March 12, 2015. FINDINGS: The heart size and mediastinal contours are within normal limits. No pneumothorax is noted. No significant pleural effusion is noted. Right lung is clear. Minimal left basilar subsegmental atelectasis or scarring is noted. The visualized skeletal structures are unremarkable. IMPRESSION: Minimal left basilar subsegmental atelectasis or scarring. Electronically Signed   By: Marijo Conception, M.D.   On: 07/22/2018 07:37    Procedures Procedures (including  critical care time)  Medications Ordered in ED Medications  hydrOXYzine (ATARAX/VISTARIL) tablet 25 mg (has no administration in time range)  ondansetron (ZOFRAN) injection 4 mg (4 mg Intravenous Given 07/22/18 0655)  fentaNYL (SUBLIMAZE) injection 50 mcg (50 mcg Intravenous Given 07/22/18 0656)  alum & mag hydroxide-simeth (MAALOX/MYLANTA) 200-200-20 MG/5ML suspension 15 mL (15 mLs Oral Given 07/22/18 0910)  fentaNYL (SUBLIMAZE) injection 50 mcg (50 mcg Intravenous Given 07/22/18 1021)     Initial Impression / Assessment and Plan / ED Course  I have reviewed the triage vital signs and the nursing notes.  Pertinent labs & imaging results that were available during my care of the patient were reviewed by me and considered in my medical decision making (see chart for details).  Patient presenting with 1 week of intermittent chest pressure and pain with intermittent shortness of breath and nausea, vomiting.  Symptoms worsened last night.  On exam, patient appears anxious and tearful.  Does have history of significant anxiety and panic attacks and endorses increased stress at home.  Exam with normal heart sounds, lungs are clear.  EKG on arrival in normal sinus rhythm.  Clinical Course as of Jul 22 1056  Tue Jul 22, 2018  8299 Pt discussed with Dr. Leonette Monarch. Will proceed chest pain rule out. Low suspicion for PE given duration and intermittent sx x 1 week.    [JR]  3716 Per RN, patient was complaining of recurrent chest pain.  Repeat EKG was done without any changes.  She has had 2- troponins.  Suspect this is likely secondary to anxiety.  Low suspicion for ACS.  Had discussion with patient to endorses increased stress at home.  Her mother just finished chemotherapy, her aunt who cares for her mother is going blind, and her son has been having some recent heart issues who is also disabled and she provides full care for.  States she has been feeling much more anxious and stressed at home.  She has been  unable to see her primary care doctor because she does not have insurance for anxiety.  Symptoms improved with fentanyl, likely secondary to sedative effect.  Will give dose of hydroxyzine and prescription.   [JR]    Clinical Course User Index [JR] Jeannia Tatro, [place] N, PA-C   CBC and BMP are unremarkable.  Initial troponin and delta troponin are negative.  Patient complained of recurrence of chest pain with repeat EKG done without any changes.  Chest x-ray is negative for infiltrate.  Low suspicion for cardiac or pulmonary etiology of symptoms.  Symptoms are intermittent, not consistent with PE.  Normal heart cath in 2013 with normal heart enzymes and EKG today.  Seems to be anxiety related, patient is tearful and endorses  increased stress at home.  Fentanyl provided improvement in symptoms though likely due to sedative effect.  Will discharge with prescription for Vistaril.  Discussed strict return precautions and PCP follow-up.  Safe for discharge.  Discussed with Dr. Leonette Monarch and Dr. Venora Maples after shift change.  Discussed results, findings, treatment and follow up. Patient advised of return precautions. Patient verbalized understanding and agreed with plan.  Final Clinical Impressions(s) / ED Diagnoses   Final diagnoses:  Anxiety  Atypical chest pain    ED Discharge Orders         Ordered    hydrOXYzine (ATARAX/VISTARIL) 25 MG tablet  Every 6 hours     07/22/18 1056           Darol Cush, [place] N, [place] 07/22/18 [place], [place], MD 07/23/18 [phone]

## 2018-07-22 NOTE — ED Notes (Signed)
Hydroxyzine pill fell on the floor and has been contaminated. The medication will be disposed as waste.This RN will attempt to pull another pill from [facility].

## 2018-08-24 ENCOUNTER — Other Ambulatory Visit: Payer: Self-pay | Admitting: Nurse Practitioner

## 2018-08-24 ENCOUNTER — Other Ambulatory Visit: Payer: Self-pay | Admitting: Internal Medicine

## 2018-09-20 ENCOUNTER — Ambulatory Visit (HOSPITAL_COMMUNITY)
Admission: EM | Admit: 2018-09-20 | Discharge: 2018-09-20 | Disposition: A | Discharge status: Home / Self Care | Payer: BLUE CROSS/BLUE SHIELD | Attending: Family Medicine | Admitting: Family Medicine

## 2018-09-20 ENCOUNTER — Encounter (HOSPITAL_COMMUNITY): Payer: Self-pay | Admitting: Emergency Medicine

## 2018-09-20 DIAGNOSIS — R05 Cough: Secondary | ICD-10-CM

## 2018-09-20 DIAGNOSIS — M797 Fibromyalgia: Secondary | ICD-10-CM | POA: Diagnosis not present

## 2018-09-20 DIAGNOSIS — I10 Essential (primary) hypertension: Secondary | ICD-10-CM

## 2018-09-20 DIAGNOSIS — R059 Cough, unspecified: Secondary | ICD-10-CM

## 2018-09-20 MED ORDER — KETOROLAC TROMETHAMINE 60 MG/2ML IM SOLN
60.0000 mg | Freq: Once | INTRAMUSCULAR | Status: AC
  Administered 2018-09-20: 60 mg via INTRAMUSCULAR

## 2018-09-20 MED ORDER — KETOROLAC TROMETHAMINE 60 MG/2ML IM SOLN
INTRAMUSCULAR | Status: AC
  Filled 2018-09-20: qty 2

## 2018-09-20 MED ORDER — BENZONATATE 100 MG PO CAPS: 100.0000 mg | ORAL_CAPSULE | Freq: Three times a day (TID) | ORAL | 0 refills | Status: DC

## 2018-09-20 MED ORDER — AZITHROMYCIN 250 MG PO TABS: 250.0000 mg | ORAL_TABLET | Freq: Every day | ORAL | 0 refills | Status: DC

## 2018-09-20 NOTE — Discharge Instructions (Signed)
Keep your follow up with your PCP on January 7th.

## 2018-09-20 NOTE — ED Provider Notes (Signed)
[facility]   [id number] 09/20/18 Arrival Time: 1016  ASSESSMENT & PLAN:  1. Cough   2. Fibromyalgia    Given duration of symptoms, will treat as below. Discussed. See AVS for d/c instructions.  Meds ordered this encounter  Medications  . ketorolac (TORADOL) injection 60 mg  . azithromycin (ZITHROMAX) 250 MG tablet    Sig: Take 1 tablet (250 mg total) by mouth daily. Take first 2 tablets together, then 1 every day until finished.    Dispense:  6 tablet    Refill:  0  . benzonatate (TESSALON) 100 MG capsule    Sig: Take 1 capsule (100 mg total) by mouth every 8 (eight) hours.    Dispense:  21 capsule    Refill:  0     Discharge Instructions     Keep your follow up with your PCP on January 7th.   Plans to discuss re-starting Lyrica with PCP.  May f/u here as needed. OTC symptom care and analgesics as needed. Ensure adequate fluid intake and rest.  Reviewed expectations re: course of current medical issues. Questions answered. Outlined signs and symptoms indicating need for more acute intervention. Patient verbalized understanding. After Visit Summary given.   SUBJECTIVE: History from: patient.  Lisa Casey is a 58 y.o. female who presents with complaint of nasal congestion, post-nasal drainage, and a persistent dry cough; without sore throat. Onset abrupt, 4-5 weeks ago. Feels symptoms have been persistent. No sinus pressure or pain now. Cough is bothering her; dry and non-productive; worse at night thus affecting sleep. Overall with significant fatigue. SOB: none. Wheezing: none. No fever or chills reported. Feels that her fibromyalgia has been flaring much more frequently with aches "all over". Overall normal PO intake without n/v. Known sick contacts: no. No specific or significant aggravating or alleviating factors reported. OTC treatment: none reported. Received flu shot this year: no.  Social History   Tobacco Use  Smoking Status Never Smoker    Smokeless Tobacco Never Used   ROS: As per HPI. All other systems negative.    OBJECTIVE:  Vitals:   09/20/18 1048  BP: (!) 144/85  Pulse: 81  Resp: 18  Temp: 98.5 F (36.9 C)  SpO2: 98%    General appearance: alert; appears fatigued; no distress HEENT: nasal congestion; clear runny nose; throat irritation secondary to post-nasal drainage; no sinus tenderness to palpation Neck: supple without LAD CV: RRR Lungs: unlabored respirations, symmetrical air entry without wheezing; cough: moderate Abd: soft Ext: no LE edema Skin: warm and dry Psychological: alert and cooperative; normal mood and affect   Allergies  Allergen Reactions  . Codeine Itching    With rash    Past Medical History:  Diagnosis Date  . Anxiety    HX OF ANXIETY ATTACKS  . Back pain, chronic   . Fibromyalgia    PT ON LYRIC   . GERD (gastroesophageal reflux disease)    NOT ON ANY MEDS AT PRESENT  . Headache(784.0)    HX OF MIGRAINES  . Hypertension    HX OF ELEVATED B/P FOR SEVERAL YRS--NO MEDS UNTIL LAST WEEK--PT WAS C/O OF CHEST PRESSURE AND B/P ELEVATED--PT SAW NURSE PRACTIONER ON 08/21/11 AND STATES EKG WAS DONE--TOLD ABNORMAL--PUT ON B/P MEDICATION LISINOPRIL AND TOLD SHE WAS TO SEE A CARDIOLOGIST.  [name] Shortness of breath    WITH EXERTION--NEW FOR PT--PT ALSO REPORTS FEELING TIRED    Social History   Socioeconomic History  . Marital status: Legally Separated  Spouse name: Not on file  . Number of children: Not on file  . Years of education: Not on file  . Highest education level: Not on file  Occupational History  . Not on file  Social Needs  . Financial resource strain: Not on file  . Food insecurity:    Worry: Not on file    Inability: Not on file  . Transportation needs:    Medical: Not on file    Non-medical: Not on file  Tobacco Use  . Smoking status: Never Smoker  . Smokeless tobacco: Never Used  Substance and Sexual Activity  . Alcohol use: No  . Drug use: No  .  Sexual activity: Yes    Birth control/protection: Other-see comments    Comment: hysterectomy  Lifestyle  . Physical activity:    Days per week: Not on file    Minutes per session: Not on file  . Stress: Not on file  Relationships  . Social connections:    Talks on phone: Not on file    Gets together: Not on file    Attends religious service: Not on file    Active member of club or organization: Not on file    Attends meetings of clubs or organizations: Not on file    Relationship status: Not on file  . Intimate partner violence:    Fear of current or ex partner: Not on file    Emotionally abused: Not on file    Physically abused: Not on file    Forced sexual activity: Not on file  Other Topics Concern  . Not on file  Social History Narrative  . Not on file           Vanessa Kick, MD 09/20/18 1143

## 2018-09-20 NOTE — ED Triage Notes (Signed)
Pt c/o cough before thanksgiving, hx of fibromyalgia, c/o pain all over.

## 2018-09-23 DIAGNOSIS — F339 Major depressive disorder, recurrent, unspecified: Secondary | ICD-10-CM | POA: Insufficient documentation

## 2018-10-27 DIAGNOSIS — G894 Chronic pain syndrome: Secondary | ICD-10-CM | POA: Insufficient documentation

## 2018-11-05 ENCOUNTER — Ambulatory Visit (INDEPENDENT_AMBULATORY_CARE_PROVIDER_SITE_OTHER): Payer: BLUE CROSS/BLUE SHIELD

## 2018-11-05 ENCOUNTER — Encounter (HOSPITAL_COMMUNITY): Payer: Self-pay | Admitting: Emergency Medicine

## 2018-11-05 ENCOUNTER — Ambulatory Visit (HOSPITAL_COMMUNITY)
Admission: EM | Admit: 2018-11-05 | Discharge: 2018-11-05 | Disposition: A | Discharge status: Home / Self Care | Payer: BLUE CROSS/BLUE SHIELD

## 2018-11-05 DIAGNOSIS — J019 Acute sinusitis, unspecified: Secondary | ICD-10-CM

## 2018-11-05 DIAGNOSIS — R05 Cough: Secondary | ICD-10-CM

## 2018-11-05 MED ORDER — CETIRIZINE-PSEUDOEPHEDRINE ER 5-120 MG PO TB12: 1.0000 | ORAL_TABLET | Freq: Every day | ORAL | 0 refills | Status: DC

## 2018-11-05 MED ORDER — FLUTICASONE PROPIONATE 50 MCG/ACT NA SUSP: 2.0000 | Freq: Every day | NASAL | 0 refills | Status: DC

## 2018-11-05 MED ORDER — AMOXICILLIN-POT CLAVULANATE 875-125 MG PO TABS: 1.0000 | ORAL_TABLET | Freq: Two times a day (BID) | ORAL | 0 refills | Status: AC

## 2018-11-05 NOTE — ED Triage Notes (Signed)
Pt presents to [facility] for assessment of continuing cough since the beginning of January.  Has been seen by her PCP and finished an antibiotic in mid January, continues to have cough that is keeping her up at night.  Also c/o bilateral *left knee worse* pain x 1 month, states worse after riding bus.

## 2018-11-05 NOTE — ED Notes (Signed)
Patient able to ambulate independently  

## 2018-11-05 NOTE — Discharge Instructions (Signed)
Chest x-ray did not show signs of pneumonia  We will treat you today for potential sinus infection Get plenty of rest and push fluids Take antibiotic as directed and to completion Follow up with PCP for recheck and/or if symptoms persists Return or go to ER if you have any new or worsening symptoms such as fever, chills, fatigue, shortness of breath, wheezing, chest pain, nausea, changes in bowel or bladder habits, etc...  Follow up with PCP for further evaluation and management of chronic joint pain

## 2018-11-05 NOTE — ED Provider Notes (Signed)
[place]   [id number] 11/05/18 Arrival Time: 76  Cc: COUGH  SUBJECTIVE:  Lisa Casey is a 58 y.o. female who presents with worsening productive cough x 1 week.  Admits to positive sick exposure to husband with similar symptoms.  Describes cough as intermittent and productive with green/brown sputum.  Was treated with azithromycin earlier last month with temporary relief.  Symptoms are made worse at night.  Reports previous symptoms in the past and diagnosed with pneumonia.  Complains of associated fatigue, subjective fever, and chills. Denies sinus pain, rhinorrhea, sore throat, SOB, wheezing, chest pain, nausea, changes in bowel or bladder habits.    Pt also mentions chronic joint pain and swelling including hands and bilateral knees.  Pt sees PCP and is currently being worked-up for this with PCP.    ROS: As per HPI.  Past Medical History:  Diagnosis Date  . Anxiety    HX OF ANXIETY ATTACKS  . Back pain, chronic   . Fibromyalgia    PT ON LYRIC   . GERD (gastroesophageal reflux disease)    NOT ON ANY MEDS AT PRESENT  . Headache(784.0)    HX OF MIGRAINES  . Hypertension    HX OF ELEVATED B/P FOR SEVERAL YRS--NO MEDS UNTIL LAST WEEK--PT WAS C/O OF CHEST PRESSURE AND B/P ELEVATED--PT SAW NURSE PRACTIONER ON 08/21/11 AND STATES EKG WAS DONE--TOLD ABNORMAL--PUT ON B/P MEDICATION LISINOPRIL AND TOLD SHE WAS TO SEE A CARDIOLOGIST.  [name] Shortness of breath    WITH EXERTION--NEW FOR PT--PT ALSO REPORTS FEELING TIRED   Past Surgical History:  Procedure Laterality Date  . CONSTIPATION    . CYSTOSCOPY W/ URETERAL STENT PLACEMENT  09/23/2012   Procedure: CYSTOSCOPY WITH RETROGRADE PYELOGRAM/URETERAL STENT PLACEMENT;  Surgeon: Alexis Frock, MD;  Location: [facility];  Service: Urology;  Laterality: Right;  . CYSTOSCOPY WITH RETROGRADE PYELOGRAM, URETEROSCOPY AND STENT PLACEMENT  10/08/2012   Procedure: CYSTOSCOPY WITH RETROGRADE PYELOGRAM, URETEROSCOPY AND STENT PLACEMENT;   Surgeon: Alexis Frock, MD;  Location[facility];  Service: Urology;  Laterality: Right;  90 MIN NO RETROGRADE NEEDS FLEX URETEROSCOPE   . HOLMIUM LASER APPLICATION  2/69/4854   Procedure: HOLMIUM LASER APPLICATION;  Surgeon: Alexis Frock, MD;  Location: [facility];  Service: Urology;  Laterality: Right;  . LEFT HEART CATHETERIZATION WITH CORONARY ANGIOGRAM N/A 10/29/2011   Procedure: LEFT HEART CATHETERIZATION WITH CORONARY ANGIOGRAM;  Surgeon: Leonie Man, MD;  Location: [facility] CATH LAB;  Service: Cardiovascular;  Laterality: N/A;  . ROBOTIC ASSISTED LAP VAGINAL HYSTERECTOMY  09/14/2011   Procedure: ROBOTIC ASSISTED LAPAROSCOPIC VAGINAL HYSTERECTOMY;  Surgeon: Agnes Lawrence, MD;  Location: [facility];  Service: Gynecology;  Laterality: N/A;  . TUBAL LIGATION     Allergies  Allergen Reactions  . Codeine Itching    With rash   No current facility-administered medications on file prior to encounter.    Current Outpatient Medications on File Prior to Encounter  Medication Sig Dispense Refill  . amLODipine (NORVASC) 10 MG tablet Take 10 mg by mouth daily. Patient "thinks" this is the right one, unsure    . DULoxetine (CYMBALTA) 60 MG capsule TAKE 1 CAPSULE BY MOUTH EVERY DAY 30 capsule 0  . hydrOXYzine (ATARAX/VISTARIL) 25 MG tablet Take 1 tablet (25 mg total) by mouth every 6 (six) hours. 12 tablet 0  . pregabalin (LYRICA) 50 MG capsule Take 150 mg by mouth 2 (two) times daily.    [name] lisinopril-hydrochlorothiazide (PRINZIDE,ZESTORETIC) 10-12.5 MG tablet TAKE 1 TABLET  BY MOUTH EVERY DAY 30 tablet 0  . Melatonin 10 MG SUBL Place 30 mg under the tongue at bedtime as needed (sleep).    [name] OVER THE COUNTER MEDICATION Take 2 capsules by mouth daily. Amberen Menopause Relief      Social History   Socioeconomic History  . Marital status: Legally Separated    Spouse name: Not on file  . Number of children: Not on file  . Years of education: Not on file  .  Highest education level: Not on file  Occupational History  . Not on file  Social Needs  . Financial resource strain: Not on file  . Food insecurity:    Worry: Not on file    Inability: Not on file  . Transportation needs:    Medical: Not on file    Non-medical: Not on file  Tobacco Use  . Smoking status: Never Smoker  . Smokeless tobacco: Never Used  Substance and Sexual Activity  . Alcohol use: No  . Drug use: No  . Sexual activity: Yes    Birth control/protection: Other-see comments    Comment: hysterectomy  Lifestyle  . Physical activity:    Days per week: Not on file    Minutes per session: Not on file  . Stress: Not on file  Relationships  . Social connections:    Talks on phone: Not on file    Gets together: Not on file    Attends religious service: Not on file    Active member of club or organization: Not on file    Attends meetings of clubs or organizations: Not on file    Relationship status: Not on file  . Intimate partner violence:    Fear of current or ex partner: Not on file    Emotionally abused: Not on file    Physically abused: Not on file    Forced sexual activity: Not on file  Other Topics Concern  . Not on file  Social History Narrative  . Not on file   History reviewed. No pertinent family history.   OBJECTIVE:  Vitals:   11/05/18 1119  BP: 139/78  Pulse: 95  Resp: 20  Temp: 98.8 F (37.1 C)  TempSrc: Oral  SpO2: 94%     General appearance: Alert, appears mildly fatigued, but nontoxic; speaking in full sentences without difficulty HEENT:NCAT; Ears: EACs clear, TMs pearly gray; Eyes: PERRL.  EOM grossly intact. Sinus: NTTP;  Nose: nares patent with mild rhinorrhea, turbinates swollen and erythematous; Throat: tonsils nonerythematous or enlarged, uvula midline  Neck: supple without LAD Lungs: clear to auscultation bilaterally without adventitious breath sounds; normal respiratory effort; mild cough present Heart: regular rate and  rhythm.  Radial pulses 2+ symmetrical bilaterally Skin: warm and dry Psychological: alert and cooperative; normal mood and affect  DIAGNOSTIC STUDIES:  Dg Chest 2 View  Result Date: 11/05/2018 CLINICAL DATA:  Persistent cough. The patient recently completed a course of antibiotics. EXAM: CHEST - 2 VIEW COMPARISON:  PA and lateral chest 07/22/2018 and 03/12/2015. FINDINGS: Small focus of linear atelectasis is noted in the left lung base. No consolidative process, pneumothorax or effusion. Heart size is normal. No acute bony abnormality. S shaped thoracolumbar scoliosis noted. IMPRESSION: No acute disease. Electronically Signed   By: Inge Rise M.D.   On: 11/05/2018 12:21     ASSESSMENT & PLAN:  1. Acute non-recurrent sinusitis, unspecified location     Meds ordered this encounter  Medications  . amoxicillin-clavulanate (AUGMENTIN) 875-125 MG tablet  Sig: Take 1 tablet by mouth every 12 (twelve) hours for 10 days.    Dispense:  20 tablet    Refill:  0    Order Specific Question:   Supervising Provider    Answer:   Raylene Everts [[id number]]  . cetirizine-pseudoephedrine (ZYRTEC-D) 5-120 MG tablet    Sig: Take 1 tablet by mouth daily.    Dispense:  30 tablet    Refill:  0    Order Specific Question:   Supervising Provider    Answer:   Raylene Everts [[id number]]  . fluticasone (FLONASE) 50 MCG/ACT nasal spray    Sig: Place 2 sprays into both nostrils daily.    Dispense:  16 g    Refill:  0    Order Specific Question:   Supervising Provider    Answer:   Raylene Everts [[id number]]    Orders Placed This Encounter  Procedures  . DG Chest 2 View    Standing Status:   Standing    Number of Occurrences:   1    Order Specific Question:   Reason for Exam (SYMPTOM  OR DIAGNOSIS REQUIRED)    Answer:   worsening productive cough x 1 week    Chest x-ray did not show signs of pneumonia  We will treat you today for potential sinus infection Get plenty of rest and push  fluids Take antibiotic as directed and to completion flonase and zyrtec prescribed.   Follow up with PCP for recheck and/or if symptoms persists Return or go to ER if you have any new or worsening symptoms such as fever, chills, fatigue, shortness of breath, wheezing, chest pain, nausea, changes in bowel or bladder habits, etc...  Follow up with PCP for further evaluation and management of chronic joint pain  Reviewed expectations re: course of current medical issues. Questions answered. Outlined signs and symptoms indicating need for more acute intervention. Patient verbalized understanding. After Visit Summary given.          Lestine Box, PA-C 11/05/18 1246

## 2018-12-22 ENCOUNTER — Encounter: Payer: Self-pay | Admitting: Internal Medicine

## 2019-02-23 DIAGNOSIS — R002 Palpitations: Secondary | ICD-10-CM | POA: Insufficient documentation

## 2019-02-23 DIAGNOSIS — M722 Plantar fascial fibromatosis: Secondary | ICD-10-CM | POA: Insufficient documentation

## 2019-03-31 DIAGNOSIS — M79671 Pain in right foot: Secondary | ICD-10-CM | POA: Insufficient documentation

## 2019-11-03 ENCOUNTER — Encounter: Payer: Self-pay | Admitting: Nurse Practitioner

## 2019-11-03 ENCOUNTER — Ambulatory Visit: Payer: BC Managed Care – PPO | Admitting: Nurse Practitioner

## 2019-11-03 ENCOUNTER — Other Ambulatory Visit: Payer: Self-pay

## 2019-11-03 VITALS — BP 124/90 | HR 94 | Temp 98.8°F | Ht 65.2 in | Wt 205.2 lb

## 2019-11-03 DIAGNOSIS — R4189 Other symptoms and signs involving cognitive functions and awareness: Secondary | ICD-10-CM

## 2019-11-03 DIAGNOSIS — F488 Other specified nonpsychotic mental disorders: Secondary | ICD-10-CM

## 2019-11-03 DIAGNOSIS — N3942 Incontinence without sensory awareness: Secondary | ICD-10-CM

## 2019-11-03 DIAGNOSIS — Z6833 Body mass index (BMI) 33.0-33.9, adult: Secondary | ICD-10-CM | POA: Diagnosis not present

## 2019-11-03 DIAGNOSIS — I10 Essential (primary) hypertension: Secondary | ICD-10-CM | POA: Diagnosis not present

## 2019-11-03 DIAGNOSIS — Z113 Encounter for screening for infections with a predominantly sexual mode of transmission: Secondary | ICD-10-CM

## 2019-11-03 DIAGNOSIS — Z0000 Encounter for general adult medical examination without abnormal findings: Secondary | ICD-10-CM

## 2019-11-03 DIAGNOSIS — F419 Anxiety disorder, unspecified: Secondary | ICD-10-CM

## 2019-11-03 DIAGNOSIS — R4586 Emotional lability: Secondary | ICD-10-CM

## 2019-11-03 DIAGNOSIS — Z636 Dependent relative needing care at home: Secondary | ICD-10-CM

## 2019-11-03 DIAGNOSIS — M797 Fibromyalgia: Secondary | ICD-10-CM

## 2019-11-03 DIAGNOSIS — R002 Palpitations: Secondary | ICD-10-CM

## 2019-11-03 DIAGNOSIS — E669 Obesity, unspecified: Secondary | ICD-10-CM | POA: Diagnosis not present

## 2019-11-03 LAB — POCT URINALYSIS DIPSTICK
Bilirubin, UA: NEGATIVE
Glucose, UA: NEGATIVE
Ketones, UA: NEGATIVE
Nitrite, UA: NEGATIVE
Protein, UA: NEGATIVE
Spec Grav, UA: 1.025 (ref 1.010–1.025)
Urobilinogen, UA: 0.2 E.U./dL
pH, UA: 5 (ref 5.0–8.0)

## 2019-11-03 LAB — POCT UA - MICROALBUMIN
Albumin/Creatinine Ratio, Urine, POC: 30
Creatinine, POC: 200 mg/dL
Microalbumin Ur, POC: 10 mg/L

## 2019-11-03 MED ORDER — HYDROXYZINE HCL 25 MG PO TABS: 25.0000 mg | ORAL_TABLET | Freq: Four times a day (QID) | ORAL | 0 refills | Status: DC

## 2019-11-03 MED ORDER — PAROXETINE HCL 10 MG PO TABS: 10.0000 mg | ORAL_TABLET | Freq: Every day | ORAL | 2 refills | Status: DC

## 2019-11-03 MED ORDER — PREGABALIN 150 MG PO CAPS: 150.0000 mg | ORAL_CAPSULE | Freq: Two times a day (BID) | ORAL | 2 refills | Status: DC

## 2019-11-03 MED ORDER — LORATADINE 10 MG PO TABS: 10.0000 mg | ORAL_TABLET | Freq: Every day | ORAL | 1 refills | Status: DC

## 2019-11-03 NOTE — Progress Notes (Addendum)
This visit occurred during the SARS-CoV-2 public health emergency.  Safety protocols were in place, including screening questions prior to the visit, additional usage of staff PPE, and extensive cleaning of exam room while observing appropriate contact time as indicated for disinfecting solutions.  Subjective:     Patient ID: Lisa Casey , female    DOB: October 26, 1960 , 59 y.o.   MRN: [id number]   Chief Complaint  Patient presents with  . Annual Exam    HPI  Here for HM - she had been going to [employer] - seen Precious Haws in August. She had left due to insurance purposes. Not seeing any specialist for any issues.  She is the caretaker for 3 people.  She reports having brain fog.    Urinary incontinence she will wet her pads.    Hypertension This is a chronic problem. The current episode started more than 1 year ago. The problem has been rapidly worsening since onset. The problem is controlled. Pertinent negatives include no anxiety, blurred vision or peripheral edema. Risk factors for coronary artery disease include sedentary lifestyle and obesity. Past treatments include ACE inhibitors and diuretics. The current treatment provides moderate improvement. There are no compliance problems.  There is no history of angina.    The patient states she is status post hysterectomy for birth control.Patient's last menstrual period was 08/18/2011. Mammogram last done 02/10/2019.  Negative for: breast discharge, breast lump(s), breast pain and breast self exam.  Pertinent negatives include abnormal bleeding (hematology), anxiety, decreased libido, depression, difficulty falling sleep, dyspareunia, history of infertility, nocturia, sexual dysfunction, sleep disturbances, urinary incontinence, urinary urgency, vaginal discharge and vaginal itching. Diet regular.The patient states her exercise level is minimal.  The patient's tobacco use is:  Social History   Tobacco Use  Smoking Status Never Smoker   Smokeless Tobacco Never Used   She has been exposed to passive smoke. The patient's alcohol use is:  Social History   Substance and Sexual Activity  Alcohol Use No  Additional information: Last pap - hysterectomy  Past Medical History:  Diagnosis Date  . Anxiety    HX OF ANXIETY ATTACKS  . Back pain, chronic   . Fibromyalgia    PT ON LYRIC   . GERD (gastroesophageal reflux disease)    NOT ON ANY MEDS AT PRESENT  . Headache(784.0)    HX OF MIGRAINES  . Hypertension    HX OF ELEVATED B/P FOR SEVERAL YRS--NO MEDS UNTIL LAST WEEK--PT WAS C/O OF CHEST PRESSURE AND B/P ELEVATED--PT SAW NURSE PRACTIONER ON 08/21/11 AND STATES EKG WAS DONE--TOLD ABNORMAL--PUT ON B/P MEDICATION LISINOPRIL AND TOLD SHE WAS TO SEE A CARDIOLOGIST.  [name] Shortness of breath    WITH EXERTION--NEW FOR PT--PT ALSO REPORTS FEELING TIRED     No family history on file.   Current Outpatient Medications:  .  amLODipine (NORVASC) 2.5 MG tablet, Take 2.5 mg by mouth daily. , Disp: , Rfl:  .  cyclobenzaprine (FLEXERIL) 5 MG tablet, Take 5 mg by mouth 3 (three) times daily as needed for muscle spasms., Disp: , Rfl:  .  DULoxetine (CYMBALTA) 60 MG capsule, TAKE 1 CAPSULE BY MOUTH EVERY DAY, Disp: 30 capsule, Rfl: 0 .  fluticasone (FLONASE) 50 MCG/ACT nasal spray, Place 2 sprays into both nostrils daily., Disp: 16 g, Rfl: 0 .  hydrOXYzine (ATARAX/VISTARIL) 25 MG tablet, Take 1 tablet (25 mg total) by mouth every 6 (six) hours. (Patient taking differently: Take 50 mg by mouth 3 (three) times daily  as needed. ), Disp: 12 tablet, Rfl: 0 .  lisinopril-hydrochlorothiazide (PRINZIDE,ZESTORETIC) 10-12.5 MG tablet, TAKE 1 TABLET BY MOUTH EVERY DAY, Disp: 30 tablet, Rfl: 0 .  loratadine (CLARITIN) 10 MG tablet, Take 10 mg by mouth daily., Disp: , Rfl:  .  pregabalin (LYRICA) 150 MG capsule, Take 150 mg by mouth 2 (two) times daily. , Disp: , Rfl:  .  OVER THE COUNTER MEDICATION, Take 2 capsules by mouth daily. Amberen Menopause  Relief, Disp: , Rfl:    Allergies  Allergen Reactions  . Codeine Itching    With rash     Review of Systems  Constitutional: Negative.   HENT: Negative.   Eyes: Negative.  Negative for blurred vision.  Respiratory: Negative.   Cardiovascular: Negative.   Gastrointestinal: Negative.   Endocrine: Negative for polydipsia, polyphagia and polyuria.  Genitourinary: Negative.   Musculoskeletal: Negative.   Skin: Negative.   Neurological: Negative.   Hematological: Negative.   Psychiatric/Behavioral: Negative.      Today's Vitals   11/03/19 1548  BP: 124/90  Pulse: 94  Temp: 98.8 F (37.1 C)  Weight: 205 lb 3.2 oz (93.1 kg)  Height: 5' 5.2" (1.656 m)  PainSc: 0-No pain   Body mass index is 33.94 kg/m.   Objective:  Physical Exam Vitals reviewed.  Constitutional:      Appearance: Normal appearance. She is well-developed.  HENT:     Head: Normocephalic and atraumatic.     Right Ear: Hearing, tympanic membrane, ear canal and external ear normal. There is no impacted cerumen.     Left Ear: Hearing, tympanic membrane, ear canal and external ear normal. There is no impacted cerumen.     Nose: Nose normal.  Eyes:     General: Lids are normal.     Extraocular Movements: Extraocular movements intact.     Pupils: Pupils are equal, round, and reactive to light.     Funduscopic exam:    Right eye: No papilledema.        Left eye: No papilledema.  Neck:     Thyroid: No thyroid mass.     Vascular: No carotid bruit.  Cardiovascular:     Rate and Rhythm: Normal rate and regular rhythm.     Pulses: Normal pulses.     Heart sounds: Normal heart sounds. No murmur.  Pulmonary:     Effort: Pulmonary effort is normal. No respiratory distress.     Breath sounds: Normal breath sounds.  Abdominal:     General: Abdomen is flat. Bowel sounds are normal. There is no distension.     Palpations: Abdomen is soft.  Musculoskeletal:        General: No swelling. Normal range of motion.      Cervical back: Full passive range of motion without pain, normal range of motion and neck supple.     Right lower leg: No edema.     Left lower leg: No edema.  Skin:    General: Skin is warm and dry.     Capillary Refill: Capillary refill takes less than 2 seconds.  Neurological:     General: No focal deficit present.     Mental Status: She is alert and oriented to person, place, and time.     Cranial Nerves: No cranial nerve deficit.     Sensory: No sensory deficit.  Psychiatric:        Behavior: Behavior normal.        Thought Content: Thought content normal.  Judgment: Judgment normal.     Comments: Tearful and flat affect         Assessment And Plan:     1. Health maintenance examination . Behavior modifications discussed and diet history reviewed.   . Pt will continue to exercise regularly and modify diet with low GI, plant based foods and decrease intake of processed foods.  . Recommend intake of daily multivitamin, Vitamin D, and calcium.  . Recommend mammogram and colonoscopy for preventive screenings, as well as recommend immunizations that include influenza, TDAP - CMP14+EGFR  2. Essential hypertension  Chronic fair control  EKG done sr short PR syndrome frequent PAC's HR 86 - POCT Urinalysis Dipstick (81002) - POCT UA - Microalbumin - EKG 12-Lead - CMP14+EGFR - Lipid panel  3. Screen for STD (sexually transmitted disease)  - HIV antibody (with reflex)  4. Fibromyalgia  Chronic, she has been seen at another provider due to insurance  - TSH  5. Obesity (BMI 30-39.9)  Chronic discussed importance of healthy diet and regular exercise  This may also help her mood - Hemoglobin A1c  6. Brain fog  Will check for metabolic cause vs related to mood changes such as depression - TSH - VITAMIN D 25 Hydroxy (Vit-D Deficiency, Fractures)  7. Urinary incontinence without sensory awareness  Discussed doing kegel exercises  [facility]  8. Heart palpitations  Will check thyroid levels   Will consider referring to cardiology if no relief  Avoid caffeine - TSH  9. Mood changes  Will start her on paxil may also help if she is having hot flashes - PARoxetine (PAXIL) 10 MG tablet; Take 1 tablet (10 mg total) by mouth daily.  Dispense: 30 tablet; Refill: 2  10. Anxiety  She is to use as needed  She is to check with her job [employer] for counseling  She was tearful during visit - hydrOXYzine (ATARAX/VISTARIL) 25 MG tablet; Take 1 tablet (25 mg total) by mouth every 6 (six) hours.  Dispense: 30 tablet; Refill: 0  11. Caregiver stress  Encouraged her to find a support group that may help and to get respite for her mother   Visit was a total of 55 minutes.   [name], FNP    THE PATIENT IS ENCOURAGED TO PRACTICE SOCIAL DISTANCING DUE TO THE COVID-19 PANDEMIC.

## 2019-11-03 NOTE — Patient Instructions (Signed)
Health Maintenance  Topic Date Due  . HIV Screening  08/18/1975  . COLONOSCOPY  08/17/2010  . INFLUENZA VACCINE  12/16/2019 (Originally 04/18/2019)  . MAMMOGRAM  02/09/2021  . PAP SMEAR-Modifier  11/02/2022  . TETANUS/TDAP  06/07/2027  . Hepatitis C Screening  Completed   Health Maintenance, Female Adopting a healthy lifestyle and getting preventive care are important in promoting health and wellness. Ask your health care provider about:  The right schedule for you to have regular tests and exams.  Things you can do on your own to prevent diseases and keep yourself healthy. What should I know about diet, weight, and exercise? Eat a healthy diet   Eat a diet that includes plenty of vegetables, fruits, low-fat dairy products, and lean protein.  Do not eat a lot of foods that are high in solid fats, added sugars, or sodium. Maintain a healthy weight Body mass index (BMI) is used to identify weight problems. It estimates body fat based on height and weight. Your health care provider can help determine your BMI and help you achieve or maintain a healthy weight. Get regular exercise Get regular exercise. This is one of the most important things you can do for your health. Most adults should:  Exercise for at least 150 minutes each week. The exercise should increase your heart rate and make you sweat (moderate-intensity exercise).  Do strengthening exercises at least twice a week. This is in addition to the moderate-intensity exercise.  Spend less time sitting. Even light physical activity can be beneficial. Watch cholesterol and blood lipids Have your blood tested for lipids and cholesterol at 60 years of age, then have this test every 5 years. Have your cholesterol levels checked more often if:  Your lipid or cholesterol levels are high.  You are older than 60 years of age.  You are at high risk for heart disease. What should I know about cancer screening? Depending on your health  history and family history, you may need to have cancer screening at various ages. This may include screening for:  Breast cancer.  Cervical cancer.  Colorectal cancer.  Skin cancer.  Lung cancer. What should I know about heart disease, diabetes, and high blood pressure? Blood pressure and heart disease  High blood pressure causes heart disease and increases the risk of stroke. This is more likely to develop in people who have high blood pressure readings, are of African descent, or are overweight.  Have your blood pressure checked: ? Every 3-5 years if you are 48-96 years of age. ? Every year if you are 33 years old or older. Diabetes Have regular diabetes screenings. This checks your fasting blood sugar level. Have the screening done:  Once every three years after age 72 if you are at a normal weight and have a low risk for diabetes.  More often and at a younger age if you are overweight or have a high risk for diabetes. What should I know about preventing infection? Hepatitis B If you have a higher risk for hepatitis B, you should be screened for this virus. Talk with your health care provider to find out if you are at risk for hepatitis B infection. Hepatitis C Testing is recommended for:  Everyone born from [birth year] through 1966.  Anyone with known risk factors for hepatitis C. Sexually transmitted infections (STIs)  Get screened for STIs, including gonorrhea and chlamydia, if: ? You are sexually active and are younger than 60 years of age. ? You are  older than 60 years of age and your health care provider tells you that you are at risk for this type of infection. ? Your sexual activity has changed since you were last screened, and you are at increased risk for chlamydia or gonorrhea. Ask your health care provider if you are at risk.  Ask your health care provider about whether you are at high risk for HIV. Your health care provider may recommend a prescription medicine to  help prevent HIV infection. If you choose to take medicine to prevent HIV, you should first get tested for HIV. You should then be tested every 3 months for as long as you are taking the medicine. Pregnancy  If you are about to stop having your period (premenopausal) and you may become pregnant, seek counseling before you get pregnant.  Take 400 to 800 micrograms (mcg) of folic acid every day if you become pregnant.  Ask for birth control (contraception) if you want to prevent pregnancy. Osteoporosis and menopause Osteoporosis is a disease in which the bones lose minerals and strength with aging. This can result in bone fractures. If you are 34 years old or older, or if you are at risk for osteoporosis and fractures, ask your health care provider if you should:  Be screened for bone loss.  Take a calcium or vitamin D supplement to lower your risk of fractures.  Be given hormone replacement therapy (HRT) to treat symptoms of menopause. Follow these instructions at home: Lifestyle  Do not use any products that contain nicotine or tobacco, such as cigarettes, e-cigarettes, and chewing tobacco. If you need help quitting, ask your health care provider.  Do not use street drugs.  Do not share needles.  Ask your health care provider for help if you need support or information about quitting drugs. Alcohol use  Do not drink alcohol if: ? Your health care provider tells you not to drink. ? You are pregnant, may be pregnant, or are planning to become pregnant.  If you drink alcohol: ? Limit how much you use to 0-1 drink a day. ? Limit intake if you are breastfeeding.  Be aware of how much alcohol is in your drink. In the [place] one drink equals one 12 oz bottle of beer (355 mL), one 5 oz glass of wine (148 mL), or one 1 oz glass of hard liquor (44 mL). General instructions  Schedule regular health, dental, and eye exams.  Stay current with your vaccines.  Tell your health care  provider if: ? You often feel depressed. ? You have ever been abused or do not feel safe at home. Summary  Adopting a healthy lifestyle and getting preventive care are important in promoting health and wellness.  Follow your health care provider's instructions about healthy diet, exercising, and getting tested or screened for diseases.  Follow your health care provider's instructions on monitoring your cholesterol and blood pressure. This information is not intended to replace advice given to you by your health care provider. Make sure you discuss any questions you have with your health care provider. Document Revised: 08/27/2018 Document Reviewed: 08/27/2018 Elsevier Patient Education  2020 [employer].

## 2019-11-04 ENCOUNTER — Telehealth: Payer: Self-pay

## 2019-11-04 LAB — LIPID PANEL
Chol/HDL Ratio: 2.4 ratio (ref 0.0–4.4)
Cholesterol, Total: 148 mg/dL (ref 100–199)
HDL: 62 mg/dL (ref >39)
LDL Chol Calc (NIH): 66 mg/dL (ref 0–99)
Triglycerides: 113 mg/dL (ref 0–149)
VLDL Cholesterol Cal: 20 mg/dL (ref 5–40)

## 2019-11-04 LAB — CMP14+EGFR
ALT: 16 IU/L (ref 0–32)
AST: 18 IU/L (ref 0–40)
Albumin/Globulin Ratio: 1.6 (ref 1.2–2.2)
Albumin: 4.5 g/dL (ref 3.8–4.9)
Alkaline Phosphatase: 108 IU/L (ref 39–117)
BUN/Creatinine Ratio: 20 (ref 9–23)
BUN: 16 mg/dL (ref 6–24)
Bilirubin Total: 0.5 mg/dL (ref 0.0–1.2)
CO2: 23 mmol/L (ref 20–29)
Calcium: 9.7 mg/dL (ref 8.7–10.2)
Chloride: 105 mmol/L (ref 96–106)
Creatinine, Ser: 0.8 mg/dL (ref 0.57–1.00)
GFR calc Af Amer: 93 mL/min/{1.73_m2} (ref >59)
GFR calc non Af Amer: 81 mL/min/{1.73_m2} (ref >59)
Globulin, Total: 2.9 g/dL (ref 1.5–4.5)
Glucose: 95 mg/dL (ref 65–99)
Potassium: 4.1 mmol/L (ref 3.5–5.2)
Sodium: 143 mmol/L (ref 134–144)
Total Protein: 7.4 g/dL (ref 6.0–8.5)

## 2019-11-04 LAB — VITAMIN D 25 HYDROXY (VIT D DEFICIENCY, FRACTURES): Vit D, 25-Hydroxy: 31.5 ng/mL (ref 30.0–100.0)

## 2019-11-04 LAB — HEMOGLOBIN A1C
Est. average glucose Bld gHb Est-mCnc: 137 mg/dL
Hgb A1c MFr Bld: 6.4 % — ABNORMAL HIGH (ref 4.8–5.6)

## 2019-11-04 LAB — TSH: TSH: 1.59 u[IU]/mL (ref 0.450–4.500)

## 2019-11-04 LAB — HIV ANTIBODY (ROUTINE TESTING W REFLEX): HIV Screen 4th Generation wRfx: NONREACTIVE

## 2019-11-04 NOTE — Telephone Encounter (Signed)
Spoke w/pt gave her provider message   [name], FNP  Candiss Norse T, CMA  Kidney and liver functions are normal. Cholesterol levels are normal. Thyroid and vitamin d are both normal. HgbA1c is 6.4 goal is less than 5.6 this is prediabetes, make sure you are avoiding sugary foods and drinks. Increase physical activity. We will recheck this in 3 months, if remains elevated will start a medication. Vitamin d is normal. HIV is negative.

## 2019-11-14 ENCOUNTER — Ambulatory Visit: Payer: BLUE CROSS/BLUE SHIELD | Attending: Internal Medicine

## 2019-11-14 DIAGNOSIS — Z23 Encounter for immunization: Secondary | ICD-10-CM

## 2019-11-14 NOTE — Progress Notes (Signed)
   Covid-19 Vaccination Clinic  Name:  Lisa Casey    MRN: [id number] DOB: 03/25/1961  11/14/2019  [name] was observed post Covid-19 immunization for 15 minutes   without incidence. She was provided with Vaccine Information Sheet and instruction to access the V-Safe system.   [name] was instructed to call 911 with any severe reactions post vaccine: [name] Difficulty breathing  . Swelling of your face and throat  . A fast heartbeat  . A bad rash all over your body  . Dizziness and weakness    Immunizations Administered    Name Date Dose VIS Date Route   Pfizer COVID-19 Vaccine 11/14/2019  1:36 PM 0.3 mL 08/28/2019 Intramuscular   Manufacturer: [place]   Lot: [id number]   [place]: [id number]

## 2019-11-23 ENCOUNTER — Other Ambulatory Visit: Payer: Self-pay | Admitting: Nurse Practitioner

## 2019-11-23 DIAGNOSIS — F419 Anxiety disorder, unspecified: Secondary | ICD-10-CM

## 2019-12-05 ENCOUNTER — Ambulatory Visit: Payer: BLUE CROSS/BLUE SHIELD | Attending: Internal Medicine

## 2019-12-05 DIAGNOSIS — Z23 Encounter for immunization: Secondary | ICD-10-CM

## 2019-12-05 NOTE — Progress Notes (Signed)
   Covid-19 Vaccination Clinic  Name:  Lisa Casey    MRN: [id number] DOB: [birth year]  12/05/2019  [name] was observed post Covid-19 immunization for 15 minutes without incident. She was provided with Vaccine Information Sheet and instruction to access the V-Safe system.   [name] was instructed to call 911 with any severe reactions post vaccine: [name] Difficulty breathing  . Swelling of face and throat  . A fast heartbeat  . A bad rash all over body  . Dizziness and weakness   Immunizations Administered    Name Date Dose VIS Date Route   Pfizer COVID-19 Vaccine 12/05/2019  1:20 PM 0.3 mL 08/28/2019 Intramuscular   Manufacturer: [place]   Lot: [id number]   [place]: [id number]

## 2019-12-10 ENCOUNTER — Other Ambulatory Visit: Payer: Self-pay | Admitting: Nurse Practitioner

## 2019-12-10 DIAGNOSIS — F419 Anxiety disorder, unspecified: Secondary | ICD-10-CM

## 2019-12-15 ENCOUNTER — Ambulatory Visit: Payer: BC Managed Care – PPO | Admitting: Nurse Practitioner

## 2019-12-21 ENCOUNTER — Other Ambulatory Visit: Payer: Self-pay

## 2019-12-21 ENCOUNTER — Encounter: Payer: Self-pay | Admitting: Nurse Practitioner

## 2019-12-21 ENCOUNTER — Ambulatory Visit: Payer: BC Managed Care – PPO | Admitting: Nurse Practitioner

## 2019-12-21 VITALS — BP 124/86 | HR 95 | Temp 98.6°F | Ht 65.2 in | Wt 206.8 lb

## 2019-12-21 DIAGNOSIS — Z6834 Body mass index (BMI) 34.0-34.9, adult: Secondary | ICD-10-CM

## 2019-12-21 DIAGNOSIS — F419 Anxiety disorder, unspecified: Secondary | ICD-10-CM | POA: Diagnosis not present

## 2019-12-21 DIAGNOSIS — E6609 Other obesity due to excess calories: Secondary | ICD-10-CM

## 2019-12-21 DIAGNOSIS — R143 Flatulence: Secondary | ICD-10-CM | POA: Diagnosis not present

## 2019-12-21 MED ORDER — HYDROXYZINE PAMOATE 25 MG PO CAPS: 25.0000 mg | ORAL_CAPSULE | Freq: Four times a day (QID) | ORAL | 2 refills | Status: DC | PRN

## 2019-12-21 MED ORDER — SAXENDA 18 MG/3ML ~~LOC~~ SOPN: 3.0000 mg | PEN_INJECTOR | Freq: Every day | SUBCUTANEOUS | 1 refills | Status: DC

## 2019-12-21 MED ORDER — BUSPIRONE HCL 5 MG PO TABS: 5.0000 mg | ORAL_TABLET | Freq: Every day | ORAL | 2 refills | Status: DC

## 2019-12-21 NOTE — Progress Notes (Signed)
This visit occurred during the SARS-CoV-2 public health emergency.  Safety protocols were in place, including screening questions prior to the visit, additional usage of staff PPE, and extensive cleaning of exam room while observing appropriate contact time as indicated for disinfecting solutions.  Subjective:     Patient ID: Lisa Casey , female    DOB: [birth year] , 59 y.o.   MRN: [id number]   Chief Complaint  Patient presents with  . med check    patient presents today for a 6 week med check on paxil and hydroxyzine    HPI  She is here for follow up new medication Paxil and hydroxyzine.  She has tried her EAP -  She has someone coming in to help with her aunt's care - 4 times a week. She is able to concentrate on her mother more. She would like to wait on referral to counselor.    She feels like the hydroxyzine two- three times a day almost daily.   She has had covid vaccine twice.      Anxiety Presents for follow-up visit. Patient reports no chest pain, dizziness or palpitations.       Past Medical History:  Diagnosis Date  . Anxiety    HX OF ANXIETY ATTACKS  . Back pain, chronic   . Fibromyalgia    PT ON LYRIC   . GERD (gastroesophageal reflux disease)    NOT ON ANY MEDS AT PRESENT  . Headache(784.0)    HX OF MIGRAINES  . Hypertension    HX OF ELEVATED B/P FOR SEVERAL YRS--NO MEDS UNTIL LAST WEEK--PT WAS C/O OF CHEST PRESSURE AND B/P ELEVATED--PT SAW NURSE PRACTIONER ON 08/21/11 AND STATES EKG WAS DONE--TOLD ABNORMAL--PUT ON B/P MEDICATION LISINOPRIL AND TOLD SHE WAS TO SEE A CARDIOLOGIST.  [name] Shortness of breath    WITH EXERTION--NEW FOR PT--PT ALSO REPORTS FEELING TIRED     No family history on file.   Current Outpatient Medications:  .  amLODipine (NORVASC) 2.5 MG tablet, Take 2.5 mg by mouth daily. , Disp: , Rfl:  .  cyclobenzaprine (FLEXERIL) 5 MG tablet, Take 5 mg by mouth 3 (three) times daily as needed for muscle spasms., Disp: , Rfl:  .   fluticasone (FLONASE) 50 MCG/ACT nasal spray, Place 2 sprays into both nostrils daily., Disp: 16 g, Rfl: 0 .  hydrOXYzine (ATARAX/VISTARIL) 25 MG tablet, TAKE 1 TABLET BY MOUTH EVERY 6 HOURS, Disp: 30 tablet, Rfl: 0 .  loratadine (CLARITIN) 10 MG tablet, Take 1 tablet (10 mg total) by mouth daily., Disp: 90 tablet, Rfl: 1 .  OVER THE COUNTER MEDICATION, Take 2 capsules by mouth daily. Amberen Menopause Relief, Disp: , Rfl:  .  PARoxetine (PAXIL) 10 MG tablet, Take 1 tablet (10 mg total) by mouth daily., Disp: 30 tablet, Rfl: 2 .  pregabalin (LYRICA) 150 MG capsule, Take 1 capsule (150 mg total) by mouth 2 (two) times daily., Disp: 60 capsule, Rfl: 2   Allergies  Allergen Reactions  . Codeine Itching    With rash     Review of Systems  Constitutional: Negative.   Respiratory: Negative.   Cardiovascular: Negative.  Negative for chest pain, palpitations and leg swelling.  Neurological: Negative for dizziness and headaches.  Psychiatric/Behavioral: Negative.      Today's Vitals   12/21/19 1611  BP: 124/86  Pulse: 95  Temp: 98.6 F (37 C)  TempSrc: Oral  SpO2: 97%  Weight: 206 lb 12.8 oz (93.8 kg)  Height: 5' 5.2" (1.656 m)  Body mass index is 34.2 kg/m.   Objective:  Physical Exam Constitutional:      General: She is not in acute distress.    Appearance: Normal appearance.  Cardiovascular:     Rate and Rhythm: Normal rate and regular rhythm.     Pulses: Normal pulses.     Heart sounds: Normal heart sounds. No murmur.  Pulmonary:     Effort: Pulmonary effort is normal. No respiratory distress.     Breath sounds: Normal breath sounds.  Skin:    Capillary Refill: Capillary refill takes less than 2 seconds.  Neurological:     General: No focal deficit present.     Mental Status: She is alert and oriented to person, place, and time.     Cranial Nerves: No cranial nerve deficit.  Psychiatric:        Mood and Affect: Mood normal.        Behavior: Behavior normal.         Thought Content: Thought content normal.        Judgment: Judgment normal.         Assessment And Plan:     1. Anxiety  Will add a daily maintenance medication  - hydrOXYzine (VISTARIL) 25 MG capsule; Take 1 capsule (25 mg total) by mouth every 6 (six) hours as needed.  Dispense: 30 capsule; Refill: 2 - busPIRone (BUSPAR) 5 MG tablet; Take 1 tablet (5 mg total) by mouth daily.  Dispense: 30 tablet; Refill: 2  2. Flatulence  Will try restora - samples given   She can get an over the counter probiotic  3. Class 1 obesity due to excess calories without serious comorbidity with body mass index (BMI) of 34.0 to 34.9 in adult Chronic Discussed healthy diet and regular exercise options  Encouraged to exercise at least 150 minutes per week with 2 days of strength training Will start [place] pending insurance approval she is to titrate weekly, discussed side effects of nausea, abdominal pain or difficulty swallowing to notify office. Saxenda teaching done  Return in 2 months for weight check.  - Liraglutide -Weight Management (SAXENDA) 18 MG/3ML SOPN; Inject 0.5 mLs (3 mg total) into the skin daily.  Dispense: 5 pen; Refill: 1     [name], FNP    THE PATIENT IS ENCOURAGED TO PRACTICE SOCIAL DISTANCING DUE TO THE COVID-19 PANDEMIC.

## 2019-12-24 ENCOUNTER — Other Ambulatory Visit: Payer: Self-pay

## 2019-12-24 MED ORDER — NOVOFINE PLUS 32G X 4 MM MISC: 2 refills | Status: DC

## 2019-12-24 NOTE — Progress Notes (Signed)
pen

## 2020-01-02 ENCOUNTER — Other Ambulatory Visit: Payer: Self-pay | Admitting: Nurse Practitioner

## 2020-01-04 NOTE — Telephone Encounter (Signed)
Please refill patient's prescription 

## 2020-02-04 ENCOUNTER — Other Ambulatory Visit: Payer: Self-pay | Admitting: Nurse Practitioner

## 2020-02-04 DIAGNOSIS — R4586 Emotional lability: Secondary | ICD-10-CM

## 2020-02-04 NOTE — Telephone Encounter (Signed)
Flereril refill

## 2020-02-22 ENCOUNTER — Ambulatory Visit: Payer: BC Managed Care – PPO | Admitting: Nurse Practitioner

## 2020-02-23 ENCOUNTER — Encounter: Payer: Self-pay | Admitting: Nurse Practitioner

## 2020-02-23 ENCOUNTER — Other Ambulatory Visit: Payer: Self-pay

## 2020-02-23 ENCOUNTER — Ambulatory Visit: Payer: BC Managed Care – PPO | Admitting: Nurse Practitioner

## 2020-02-23 VITALS — BP 130/80 | HR 94 | Temp 98.3°F | Ht 65.6 in | Wt 190.0 lb

## 2020-02-23 DIAGNOSIS — Z1211 Encounter for screening for malignant neoplasm of colon: Secondary | ICD-10-CM | POA: Diagnosis not present

## 2020-02-23 DIAGNOSIS — Z6834 Body mass index (BMI) 34.0-34.9, adult: Secondary | ICD-10-CM

## 2020-02-23 DIAGNOSIS — I10 Essential (primary) hypertension: Secondary | ICD-10-CM | POA: Diagnosis not present

## 2020-02-23 DIAGNOSIS — E669 Obesity, unspecified: Secondary | ICD-10-CM

## 2020-02-23 DIAGNOSIS — M545 Low back pain, unspecified: Secondary | ICD-10-CM

## 2020-02-23 DIAGNOSIS — R4586 Emotional lability: Secondary | ICD-10-CM

## 2020-02-23 DIAGNOSIS — E6609 Other obesity due to excess calories: Secondary | ICD-10-CM

## 2020-02-23 DIAGNOSIS — F419 Anxiety disorder, unspecified: Secondary | ICD-10-CM

## 2020-02-23 MED ORDER — KETOROLAC TROMETHAMINE 60 MG/2ML IM SOLN: 60.0000 mg | Freq: Once | INTRAMUSCULAR | Status: DC

## 2020-02-23 MED ORDER — KETOROLAC TROMETHAMINE 60 MG/2ML IM SOLN
60.0000 mg | Freq: Once | INTRAMUSCULAR | Status: AC
  Administered 2020-02-23: 60 mg via INTRAMUSCULAR

## 2020-02-23 MED ORDER — PAROXETINE HCL 10 MG PO TABS: 10.0000 mg | ORAL_TABLET | Freq: Every day | ORAL | 1 refills | Status: DC

## 2020-02-23 MED ORDER — SAXENDA 18 MG/3ML ~~LOC~~ SOPN: 3.0000 mg | PEN_INJECTOR | Freq: Every day | SUBCUTANEOUS | 1 refills | Status: DC

## 2020-02-23 MED ORDER — PAROXETINE HCL 20 MG PO TABS: 20.0000 mg | ORAL_TABLET | Freq: Every day | ORAL | 1 refills | Status: DC

## 2020-02-23 NOTE — Progress Notes (Signed)
This visit occurred during the SARS-CoV-2 public health emergency.  Safety protocols were in place, including screening questions prior to the visit, additional usage of staff PPE, and extensive cleaning of exam room while observing appropriate contact time as indicated for disinfecting solutions.  Subjective:     Patient ID: Lisa Casey , female    DOB: 18-Dec-1960 , 59 y.o.   MRN: [id number]   Chief Complaint  Patient presents with  . Weight Check    patient presents today for a weight check  . Back Pain    HPI  Here for weight check. She is taking a probiotic.  She is drinking 4-5 glasses of water a day. She is tolerating Saxenda well at 3 mg.  She did not have an appetite initially and noticed when she was feeling nauseated. She is not currently exercising.     Wt Readings from Last 3 Encounters: 02/23/20 : 190 lb (86.2 kg) 12/21/19 : 206 lb 12.8 oz (93.8 kg) 11/03/19 : 205 lb 3.2 oz (93.1 kg)   Sometimes when she has a bowel movement after will feel a discomfort in her lower abdomen.    Reports after taking buspirone she would wake up with a bad headache so she stopped taking the medication and took the hydroxyzine. She has started back taking the over the counter menopause vitamin x 2 weeks ago.    Back Pain This is a recurrent problem. 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 The problem occurs constantly. The problem has been gradually worsening since onset. The pain is present in the lumbar spine. The quality of the pain is described as aching (stiffness). The pain does not radiate. Worse during: improves throughout the day and by the end of the day is worse. Pertinent negatives include no headaches. Risk factors include obesity. She has tried analgesics and heat (brace) for the symptoms.  Anxiety Presents for follow-up visit. Patient reports no dizziness, insomnia or shortness of breath.       Past Medical History:  Diagnosis  Date  . Anxiety    HX OF ANXIETY ATTACKS  . Back pain, chronic   . Fibromyalgia    PT ON LYRIC   . GERD (gastroesophageal reflux disease)    NOT ON ANY MEDS AT PRESENT  . Headache(784.0)    HX OF MIGRAINES  . Hypertension    HX OF ELEVATED B/P FOR SEVERAL YRS--NO MEDS UNTIL LAST WEEK--PT WAS C/O OF CHEST PRESSURE AND B/P ELEVATED--PT SAW NURSE PRACTIONER ON 08/21/11 AND STATES EKG WAS DONE--TOLD ABNORMAL--PUT ON B/P MEDICATION LISINOPRIL AND TOLD SHE WAS TO SEE A CARDIOLOGIST.  [name] Shortness of breath    WITH EXERTION--NEW FOR PT--PT ALSO REPORTS FEELING TIRED     No family history on file.   Current Outpatient Medications:  .  amLODipine (NORVASC) 2.5 MG tablet, Take 2.5 mg by mouth daily. , Disp: , Rfl:  .  cyclobenzaprine (FLEXERIL) 5 MG tablet, Take 1 tablet by mouth three times daily as needed for muscle spasm, Disp: 40 tablet, Rfl: 0 .  fluticasone (FLONASE) 50 MCG/ACT nasal spray, Place 2 sprays into both nostrils daily., Disp: 16 g, Rfl: 0 .  hydrOXYzine (VISTARIL) 25 MG capsule, Take 1 capsule (25 mg total) by mouth every 6 (six) hours as needed., Disp: 30 capsule, Rfl: 2 .  Insulin Pen Needle (NOVOFINE PLUS) 32G X 4 MM MISC, Use as directed with Saxenda, Disp: 100 each, Rfl: 2 .  Liraglutide -Weight Management (SAXENDA) 18 MG/3ML SOPN, Inject 0.5 mLs (3 mg total) into the skin daily., Disp: 5 pen, Rfl: 1 .  loratadine (CLARITIN) 10 MG tablet, Take 1 tablet (10 mg total) by mouth daily., Disp: 90 tablet, Rfl: 1 .  OVER THE COUNTER MEDICATION, Take 2 capsules by mouth daily. Amberen Menopause Relief, Disp: , Rfl:  .  PARoxetine (PAXIL) 10 MG tablet, Take 1 tablet by mouth once daily, Disp: 30 tablet, Rfl: 0 .  pregabalin (LYRICA) 150 MG capsule, Take 1 capsule (150 mg total) by mouth 2 (two) times daily., Disp: 60 capsule, Rfl: 2 .  busPIRone (BUSPAR) 5 MG tablet, Take 1 tablet (5 mg total) by mouth daily. (Patient not taking: Reported on 02/23/2020), Disp: 30 tablet, Rfl: 2    Allergies  Allergen Reactions  . Codeine Itching    With rash     Review of Systems  Constitutional: Negative.   Respiratory: Negative.  Negative for shortness of breath.   Cardiovascular: Negative.  Negative for leg swelling.  Musculoskeletal: Positive for back pain.  Neurological: Negative for dizziness and headaches.  Psychiatric/Behavioral: Negative.  The patient does not have insomnia.      Today's Vitals   02/23/20 1052  BP: 130/80  Pulse: 94  Temp: 98.3 F (36.8 C)  TempSrc: Oral  Weight: 190 lb (86.2 kg)  Height: 5' 5.6" (1.666 m)  PainSc: 3   PainLoc: Back   Body mass index is 31.04 kg/m.   Objective:  Physical Exam Constitutional:      General: She is not in acute distress.    Appearance: Normal appearance.  Cardiovascular:     Rate and Rhythm: Normal rate and regular rhythm.     Pulses: Normal pulses.     Heart sounds: Normal heart sounds. No murmur.  Pulmonary:     Effort: Pulmonary effort is normal. No respiratory distress.     Breath sounds: Normal breath sounds.  Skin:    Capillary Refill: Capillary refill takes less than 2 seconds.  Neurological:     General: No focal deficit present.     Mental Status: She is alert and oriented to person, place, and time.     Cranial Nerves: No cranial nerve deficit.  Psychiatric:        Mood and Affect: Mood normal.        Behavior: Behavior normal.        Thought Content: Thought content normal.        Judgment: Judgment normal.         Assessment And Plan:      1. Anxiety  Chronic, I will increase her paroxetine,  She did not tolerate the buspirone well so she stopped taking  She will also take the hydroxyzine as needed  2. Essential hypertension  Chronic, fair control  Continue with current medications  3. Encounter for screening colonoscopy  According to USPTF Colorectal cancer Screening guidelines. Colonoscopy is recommended every 10 years, starting at age 93years.  Will refer to  GI for colon cancer screening. - Ambulatory referral to Gastroenterology  4. Acute bilateral low back pain without sciatica  She has been taking flexeril and tylenol with minimal relief - ketorolac (TORADOL) injection 60 mg  5. Mood changes  Will increase dose of paroxetine this may also help with hot flashes - PARoxetine (PAXIL) 20 MG tablet; Take 1 tablet (20 mg total) by mouth daily.  Dispense: 90 tablet; Refill: 1  6. Class 1 obesity due to excess  calories without serious comorbidity with body mass index (BMI) of 34.0 to 34.9 in adult  She has lost 16 lbs since her last office visit  Tolerating saxenda well  Encouraged to increase physical activity to maximize weight loss - Liraglutide -Weight Management (SAXENDA) 18 MG/3ML SOPN; Inject 0.5 mLs (3 mg total) into the skin daily.  Dispense: 5 pen; Refill: 1   [name], FNP    THE PATIENT IS ENCOURAGED TO PRACTICE SOCIAL DISTANCING DUE TO THE COVID-19 PANDEMIC.

## 2020-03-09 ENCOUNTER — Other Ambulatory Visit: Payer: Self-pay | Admitting: Nurse Practitioner

## 2020-03-09 DIAGNOSIS — M797 Fibromyalgia: Secondary | ICD-10-CM

## 2020-03-09 NOTE — Telephone Encounter (Signed)
Cyclobenzaprine refill 

## 2020-03-16 ENCOUNTER — Other Ambulatory Visit: Payer: Self-pay

## 2020-03-16 MED ORDER — AMLODIPINE BESYLATE 2.5 MG PO TABS: 2.5000 mg | ORAL_TABLET | Freq: Every day | ORAL | 0 refills | Status: DC

## 2020-03-24 ENCOUNTER — Telehealth: Payer: Self-pay

## 2020-03-24 ENCOUNTER — Encounter: Payer: Self-pay | Admitting: Nurse Practitioner

## 2020-03-24 ENCOUNTER — Other Ambulatory Visit: Payer: Self-pay

## 2020-03-24 ENCOUNTER — Telehealth (INDEPENDENT_AMBULATORY_CARE_PROVIDER_SITE_OTHER): Payer: BC Managed Care – PPO | Admitting: Nurse Practitioner

## 2020-03-24 DIAGNOSIS — M797 Fibromyalgia: Secondary | ICD-10-CM

## 2020-03-24 DIAGNOSIS — R519 Headache, unspecified: Secondary | ICD-10-CM | POA: Diagnosis not present

## 2020-03-24 NOTE — Progress Notes (Signed)
Virtual Visit via video   This visit type was conducted due to national recommendations for restrictions regarding the COVID-19 Pandemic (e.g. social distancing) in an effort to limit this patient's exposure and mitigate transmission in our community.  Due to her co-morbid illnesses, this patient is at least at moderate risk for complications without adequate follow up.  This format is felt to be most appropriate for this patient at this time.  All issues noted in this document were discussed and addressed.  A limited physical exam was performed with this format.    This visit type was conducted due to national recommendations for restrictions regarding the COVID-19 Pandemic (e.g. social distancing) in an effort to limit this patient's exposure and mitigate transmission in our community.  Patients identity confirmed using two different identifiers.  This format is felt to be most appropriate for this patient at this time.  All issues noted in this document were discussed and addressed.  No physical exam was performed (except for noted visual exam findings with Video Visits).    Date:  03/24/2020   ID:  Lisa Casey, DOB 07/28/1961, MRN [id number]  Patient Location:  Home - spoke with   Provider location:   Office    Chief Complaint:  Sinus problems  History of Present Illness:    Lisa Casey is a 59 y.o. female who presents via video conferencing for a telehealth visit today.    The patient does not have symptoms concerning for COVID-19 infection (fever, chills, cough, or new shortness of breath).   She is also having body aches, had diarrhea for 2-3 days.  She has taken tylenol with mild relief at the beginning but still has pain rating 6-7/10.    She has not been in to work for the last week and has been afebrile.  She states "she is having improved pain", she is planning to bear the pain since she is off today.     Sinusitis This is a new problem. There has been no fever.  Her pain is at a severity of 8/10. The pain is severe. Associated symptoms include headaches. Pertinent negatives include no chills, congestion, coughing, neck pain, shortness of breath, sinus pressure, sneezing or sore throat. (She had sinus drainage and headache.  ) Treatments tried: she switched from claritin to allegra the day before yesterday and the headache went away.  The treatment provided moderate relief.     Past Medical History:  Diagnosis Date  . Anxiety    HX OF ANXIETY ATTACKS  . Back pain, chronic   . Fibromyalgia    PT ON LYRIC   . GERD (gastroesophageal reflux disease)    NOT ON ANY MEDS AT PRESENT  . Headache(784.0)    HX OF MIGRAINES  . Hypertension    HX OF ELEVATED B/P FOR SEVERAL YRS--NO MEDS UNTIL LAST WEEK--PT WAS C/O OF CHEST PRESSURE AND B/P ELEVATED--PT SAW NURSE PRACTIONER ON 08/21/11 AND STATES EKG WAS DONE--TOLD ABNORMAL--PUT ON B/P MEDICATION LISINOPRIL AND TOLD SHE WAS TO SEE A CARDIOLOGIST.  Lisa Casey Shortness of breath    WITH EXERTION--NEW FOR PT--PT ALSO REPORTS FEELING TIRED   Past Surgical History:  Procedure Laterality Date  . CONSTIPATION    . CYSTOSCOPY W/ URETERAL STENT PLACEMENT  09/23/2012   Procedure: CYSTOSCOPY WITH RETROGRADE PYELOGRAM/URETERAL STENT PLACEMENT;  Surgeon: Alexis Frock, MD;  Location: [facility];  Service: Urology;  Laterality: Right;  . CYSTOSCOPY WITH RETROGRADE PYELOGRAM, URETEROSCOPY AND STENT PLACEMENT  10/08/2012  Procedure: CYSTOSCOPY WITH RETROGRADE PYELOGRAM, URETEROSCOPY AND STENT PLACEMENT;  Surgeon: Alexis Frock, MD;  Location: [facility];  Service: Urology;  Laterality: Right;  90 MIN NO RETROGRADE NEEDS FLEX URETEROSCOPE   . HOLMIUM LASER APPLICATION  7/85/8850   Procedure: HOLMIUM LASER APPLICATION;  Surgeon: Alexis Frock, MD;  Location: [facility];  Service: Urology;  Laterality: Right;  . LEFT HEART CATHETERIZATION WITH CORONARY ANGIOGRAM N/A 10/29/2011   Procedure: LEFT HEART  CATHETERIZATION WITH CORONARY ANGIOGRAM;  Surgeon: Leonie Man, MD;  Location[facility];  Service: Cardiovascular;  Laterality: N/A;  . ROBOTIC ASSISTED LAP VAGINAL HYSTERECTOMY  09/14/2011   Procedure: ROBOTIC ASSISTED LAPAROSCOPIC VAGINAL HYSTERECTOMY;  Surgeon: Agnes Lawrence, MD;  Location: [facility];  Service: Gynecology;  Laterality: N/A;  . TUBAL LIGATION       Current Meds  Medication Sig  . amLODipine (NORVASC) 2.5 MG tablet Take 1 tablet (2.5 mg total) by mouth daily.  . cyclobenzaprine (FLEXERIL) 5 MG tablet Take 1 tablet by mouth three times daily as needed for muscle spasm  . fluticasone (FLONASE) 50 MCG/ACT nasal spray Place 2 sprays into both nostrils daily.  . hydrOXYzine (VISTARIL) 25 MG capsule Take 1 capsule (25 mg total) by mouth every 6 (six) hours as needed.  . Insulin Pen Needle (NOVOFINE PLUS) 32G X 4 MM MISC Use as directed with Saxenda  . Liraglutide -Weight Management (SAXENDA) 18 MG/3ML SOPN Inject 0.5 mLs (3 mg total) into the skin daily.  Lisa Casey loratadine (CLARITIN) 10 MG tablet Take 1 tablet (10 mg total) by mouth daily.  Lisa Casey OVER THE COUNTER MEDICATION Take 2 capsules by mouth daily. Amberen Menopause Relief  . PARoxetine (PAXIL) 20 MG tablet Take 1 tablet (20 mg total) by mouth daily.  . pregabalin (LYRICA) 150 MG capsule Take 1 capsule by mouth twice daily     Allergies:   Codeine   Social History   Tobacco Use  . Smoking status: Never Smoker  . Smokeless tobacco: Never Used  Substance Use Topics  . Alcohol use: No  . Drug use: No     Family Hx: The patient's family history is not on file.  ROS:   Please see the history of present illness.    Review of Systems  Constitutional: Negative for chills.  HENT: Negative for congestion, sinus pressure, sneezing and sore throat.   Respiratory: Negative for cough and shortness of breath.   Musculoskeletal: Negative for neck pain.  Neurological: Positive for headaches. Negative for dizziness.    Psychiatric/Behavioral: Negative.     All other systems reviewed and are negative.   Labs/Other Tests and Data Reviewed:    Recent Labs: 11/03/2019: ALT 16; BUN 16; Creatinine, Ser 0.80; Potassium 4.1; Sodium 143; TSH 1.590   Recent Lipid Panel Lab Results  Component Value Date/Time   CHOL 148 11/03/2019 05:08 PM   TRIG 113 11/03/2019 05:08 PM   HDL 62 11/03/2019 05:08 PM   CHOLHDL 2.4 11/03/2019 05:08 PM   LDLCALC 66 11/03/2019 05:08 PM    Wt Readings from Last 3 Encounters:  02/23/20 190 lb (86.2 kg)  12/21/19 206 lb 12.8 oz (93.8 kg)  11/03/19 205 lb 3.2 oz (93.1 kg)     Exam:    Vital Signs:  LMP 08/18/2011     Physical Exam Vitals reviewed.  Constitutional:      Appearance: Normal appearance.  Cardiovascular:     Rate and Rhythm: Normal rate and regular rhythm.  Pulses: Normal pulses.     Heart sounds: Normal heart sounds. No murmur heard.   Pulmonary:     Effort: Pulmonary effort is normal. No respiratory distress.     Breath sounds: Normal breath sounds.  Skin:    Capillary Refill: Capillary refill takes less than 2 seconds.  Neurological:     General: No focal deficit present.     Mental Status: She is alert and oriented to person, place, and time.     Cranial Nerves: No cranial nerve deficit.  Psychiatric:        Mood and Affect: Mood normal.        Behavior: Behavior normal.        Thought Content: Thought content normal.        Judgment: Judgment normal.     ASSESSMENT & PLAN:    1. Acute nonintractable headache, unspecified headache type  She is improving after taking allegra  2. Fibromyalgia  Likely having a flare, at this time not interested in steroid however is advised to take 1000 mg tylenol two times a day for the next 3 days   COVID-19 Education: The signs and symptoms of COVID-19 were discussed with the patient and how to seek care for testing (follow up with PCP or arrange E-visit).  The importance of social distancing was  discussed today.  Patient Risk:   After full review of this patients clinical status, I feel that they are at least moderate risk at this time.  Time:   Today, I have spent 10 minutes/ seconds with the patient with telehealth technology discussing above diagnoses.     Medication Adjustments/Labs and Tests Ordered: Current medicines are reviewed at length with the patient today.  Concerns regarding medicines are outlined above.   Tests Ordered: No orders of the defined types were placed in this encounter.   Medication Changes: No orders of the defined types were placed in this encounter.   Disposition:  Follow up prn  Signed, [name], FNP

## 2020-03-24 NOTE — Telephone Encounter (Signed)
PT CONSENTED TO VIRTUAL VISIT ON 07/06-TIMA

## 2020-04-16 ENCOUNTER — Other Ambulatory Visit: Payer: Self-pay | Admitting: Nurse Practitioner

## 2020-04-16 DIAGNOSIS — M797 Fibromyalgia: Secondary | ICD-10-CM

## 2020-04-18 NOTE — Telephone Encounter (Signed)
Cyclobenzaprine and pregabalin refill

## 2020-04-25 ENCOUNTER — Telehealth: Payer: Self-pay

## 2020-04-25 NOTE — Telephone Encounter (Signed)
PA has been submitted through covermymeds we are just waiting for the determination. YL,RMA

## 2020-04-27 ENCOUNTER — Telehealth: Payer: Self-pay

## 2020-04-27 NOTE — Telephone Encounter (Signed)
Attempted to contact pt VM full unable to leave message   Lisa Casey her saxenda PA was denied so JM wants to know if she wants to try wegovy once weekly?

## 2020-05-02 ENCOUNTER — Ambulatory Visit: Payer: BC Managed Care – PPO | Admitting: Nurse Practitioner

## 2020-05-23 ENCOUNTER — Other Ambulatory Visit: Payer: Self-pay | Admitting: Nurse Practitioner

## 2020-05-23 DIAGNOSIS — Z6834 Body mass index (BMI) 34.0-34.9, adult: Secondary | ICD-10-CM

## 2020-05-23 DIAGNOSIS — M797 Fibromyalgia: Secondary | ICD-10-CM

## 2020-05-23 DIAGNOSIS — E6609 Other obesity due to excess calories: Secondary | ICD-10-CM

## 2020-05-25 ENCOUNTER — Other Ambulatory Visit: Payer: Self-pay | Admitting: Nurse Practitioner

## 2020-05-25 DIAGNOSIS — M797 Fibromyalgia: Secondary | ICD-10-CM

## 2020-05-25 MED ORDER — PREGABALIN 150 MG PO CAPS: 150.0000 mg | ORAL_CAPSULE | Freq: Two times a day (BID) | ORAL | 1 refills | Status: DC

## 2020-06-08 ENCOUNTER — Encounter: Payer: Self-pay | Admitting: Nurse Practitioner

## 2020-06-08 ENCOUNTER — Ambulatory Visit: Payer: BC Managed Care – PPO | Admitting: Nurse Practitioner

## 2020-06-08 ENCOUNTER — Other Ambulatory Visit: Payer: Self-pay

## 2020-06-08 VITALS — BP 132/84 | HR 68 | Temp 98.1°F | Ht 65.2 in | Wt 205.0 lb

## 2020-06-08 DIAGNOSIS — M25511 Pain in right shoulder: Secondary | ICD-10-CM

## 2020-06-08 DIAGNOSIS — M25561 Pain in right knee: Secondary | ICD-10-CM | POA: Diagnosis not present

## 2020-06-08 DIAGNOSIS — E6609 Other obesity due to excess calories: Secondary | ICD-10-CM

## 2020-06-08 DIAGNOSIS — R632 Polyphagia: Secondary | ICD-10-CM | POA: Diagnosis not present

## 2020-06-08 DIAGNOSIS — M797 Fibromyalgia: Secondary | ICD-10-CM

## 2020-06-08 DIAGNOSIS — G8929 Other chronic pain: Secondary | ICD-10-CM

## 2020-06-08 DIAGNOSIS — M79641 Pain in right hand: Secondary | ICD-10-CM

## 2020-06-08 DIAGNOSIS — Z6833 Body mass index (BMI) 33.0-33.9, adult: Secondary | ICD-10-CM

## 2020-06-08 MED ORDER — KETOROLAC TROMETHAMINE 60 MG/2ML IM SOLN
60.0000 mg | Freq: Once | INTRAMUSCULAR | Status: AC
  Administered 2020-06-08: 60 mg via INTRAMUSCULAR

## 2020-06-08 MED ORDER — SAXENDA 18 MG/3ML ~~LOC~~ SOPN: 3.0000 mg | PEN_INJECTOR | Freq: Every day | SUBCUTANEOUS | 1 refills | Status: DC

## 2020-06-08 MED ORDER — TRIAMCINOLONE ACETONIDE 40 MG/ML IJ SUSP
40.0000 mg | Freq: Once | INTRAMUSCULAR | Status: AC
  Administered 2020-06-08: 40 mg via INTRAMUSCULAR

## 2020-06-08 MED ORDER — LISDEXAMFETAMINE DIMESYLATE 20 MG PO CAPS: 20.0000 mg | ORAL_CAPSULE | Freq: Every day | ORAL | 0 refills | Status: DC

## 2020-06-08 NOTE — Progress Notes (Signed)
I,Yamilka Roman [employer] as a Education administrator for [employer], FNP.,have documented all relevant documentation on the behalf of [name], FNP,as directed by  [name], FNP while in the presence of [name], [place].  This visit occurred during the SARS-CoV-2 public health emergency.  Safety protocols were in place, including screening questions prior to the visit, additional usage of staff PPE, and extensive cleaning of exam room while observing appropriate contact time as indicated for disinfecting solutions.  Subjective:     Patient ID: Lisa Casey , female    DOB: Jan 15, 1961 , 59 y.o.   MRN: [id number]   Chief Complaint  Patient presents with  . Weight Check  . Fibromyalgia    patient stated she has been having pain for the past week  . Referral    she would like to e referred to her orthopaedic     HPI  Here for weight check, she is not taking Saxenda at this time.  She says she started eating more food while on vacation while others were eating. She denies missing any doses.  She stopped taking the Saxenda about 3 weeks ago.     Wt Readings from Last 3 Encounters: 06/08/20 : 205 lb (93 kg) 02/23/20 : 190 lb (86.2 kg) 12/21/19 : 206 lb 12.8 oz (93.8 kg)   She would like a referral to an orthopedic due to having right knee pain when she drives for long periods. She is also having right hand and right shoulder pain.  Symptoms reoccurred one month ago.  "feels like her right knee is going to explode"   Dr. Rosendo Gros         Past Medical History:  Diagnosis Date  . Anxiety    HX OF ANXIETY ATTACKS  . Back pain, chronic   . Fibromyalgia    PT ON LYRIC   . GERD (gastroesophageal reflux disease)    NOT ON ANY MEDS AT PRESENT  . Headache(784.0)    HX OF MIGRAINES  . Hypertension    HX OF ELEVATED B/P FOR SEVERAL YRS--NO MEDS UNTIL LAST WEEK--PT WAS C/O OF CHEST PRESSURE AND B/P ELEVATED--PT SAW NURSE PRACTIONER ON 08/21/11 AND STATES EKG WAS DONE--TOLD ABNORMAL--PUT  ON B/P MEDICATION LISINOPRIL AND TOLD SHE WAS TO SEE A CARDIOLOGIST.  [name] Shortness of breath    WITH EXERTION--NEW FOR PT--PT ALSO REPORTS FEELING TIRED     History reviewed. No pertinent family history.   Current Outpatient Medications:  .  amLODipine (NORVASC) 2.5 MG tablet, Take 1 tablet by mouth once daily, Disp: 90 tablet, Rfl: 0 .  cyclobenzaprine (FLEXERIL) 5 MG tablet, Take 1 tablet by mouth three times daily as needed for muscle spasm, Disp: 40 tablet, Rfl: 0 .  fluticasone (FLONASE) 50 MCG/ACT nasal spray, Place 2 sprays into both nostrils daily., Disp: 16 g, Rfl: 0 .  hydrOXYzine (VISTARIL) 25 MG capsule, Take 1 capsule (25 mg total) by mouth every 6 (six) hours as needed., Disp: 30 capsule, Rfl: 2 .  Insulin Pen Needle (NOVOFINE PLUS) 32G X 4 MM MISC, Use as directed with Saxenda, Disp: 100 each, Rfl: 2 .  PARoxetine (PAXIL) 20 MG tablet, Take 1 tablet (20 mg total) by mouth daily., Disp: 90 tablet, Rfl: 1 .  pregabalin (LYRICA) 150 MG capsule, Take 1 capsule (150 mg total) by mouth 2 (two) times daily., Disp: 60 capsule, Rfl: 1 .  Liraglutide -Weight Management (SAXENDA) 18 MG/3ML SOPN, Inject 3 mg into the skin daily., Disp: 15 mL, Rfl:  1 .  lisdexamfetamine (VYVANSE) 20 MG capsule, Take 1 capsule (20 mg total) by mouth daily., Disp: 30 capsule, Rfl: 0   Allergies  Allergen Reactions  . Codeine Itching    With rash     Review of Systems  Constitutional: Negative.  Negative for fatigue.  Respiratory: Negative.   Cardiovascular: Negative.  Negative for chest pain, palpitations and leg swelling.  Musculoskeletal: Positive for arthralgias.       She has been aching since last week  Neurological: Negative for dizziness and headaches.  Psychiatric/Behavioral: Negative.      Today's Vitals   06/08/20 1014  BP: 132/84  Pulse: 68  Temp: 98.1 F (36.7 C)  TempSrc: Oral  Weight: 205 lb (93 kg)  Height: 5' 5.2" (1.656 m)  PainSc: 8    Body mass index is 33.9 kg/m.    Objective:  Physical Exam Vitals reviewed.  Constitutional:      General: She is not in acute distress.    Appearance: Normal appearance. She is obese.  Cardiovascular:     Rate and Rhythm: Normal rate and regular rhythm.     Pulses: Normal pulses.     Heart sounds: Normal heart sounds. No murmur heard.   Pulmonary:     Effort: Pulmonary effort is normal. No respiratory distress.     Breath sounds: Normal breath sounds.  Skin:    Capillary Refill: Capillary refill takes less than 2 seconds.  Neurological:     General: No focal deficit present.     Mental Status: She is alert and oriented to person, place, and time.     Cranial Nerves: No cranial nerve deficit.     Motor: No weakness.  Psychiatric:        Mood and Affect: Mood normal.        Behavior: Behavior normal.        Thought Content: Thought content normal.        Judgment: Judgment normal.         Assessment And Plan:     1. Class 1 obesity due to excess calories without serious comorbidity with body mass index (BMI) of 33.0 to 33.9 in adult  Chronic  Discussed healthy diet and regular exercise options   Encouraged to exercise at least 150 minutes per week with 2 days of strength training  Given handout for http://[email or web address]-[email or web address].[email or web address] 10 tips, CDC exercise for adults and CDC Eat More Weight Less  She is advised to titrate her Saxenda back up slowly  Return in 1 month for weight check. - Liraglutide -Weight Management (SAXENDA) 18 MG/3ML SOPN; Inject 3 mg into the skin daily.  Dispense: 15 mL; Refill: 1  2. Binge eating  She has been eating multiple times even if she is not hungry  Will try vyvanse 20 mg daily, discussed side effects to include elevated heart rate - lisdexamfetamine (VYVANSE) 20 MG capsule; Take 1 capsule (20 mg total) by mouth daily.  Dispense: 30 capsule; Refill: 0  3. Chronic pain of right knee  She would like a referral for orthopedics, no abnormal findings during exam  4. Right hand  pain  This is chronic, would like for orthopedics to evaluate further with Dr Rosendo Gros  5. Acute pain of right shoulder  Reoccurring of chronic shoulder pain, will refer to orthopedics - Ambulatory referral to Orthopedic Surgery  6. Fibromyalgia  No improvement with taking tylenol 1000 mg BID  Administer toradol and kenalog   - ketorolac (TORADOL) injection 60 mg -  triamcinolone acetonide (KENALOG-40) injection 40 mg     Patient was given opportunity to ask questions. Patient verbalized understanding of the plan and was able to repeat key elements of the plan. All questions were answered to their satisfaction.   Teola Bradley, FNP, have reviewed all documentation for this visit. The documentation on 06/08/20 for the exam, diagnosis, procedures, and orders are all accurate and complete.   THE PATIENT IS ENCOURAGED TO PRACTICE SOCIAL DISTANCING DUE TO THE COVID-19 PANDEMIC.

## 2020-06-08 NOTE — Patient Instructions (Signed)

## 2020-06-13 ENCOUNTER — Telehealth: Payer: Self-pay

## 2020-06-13 NOTE — Telephone Encounter (Signed)
Prior auth completed for Saxenda.  Waiting on response from the pt's insurance.

## 2020-06-22 ENCOUNTER — Encounter: Payer: Self-pay | Admitting: Orthopaedic Surgery

## 2020-06-22 ENCOUNTER — Ambulatory Visit: Payer: Self-pay

## 2020-06-22 ENCOUNTER — Ambulatory Visit (INDEPENDENT_AMBULATORY_CARE_PROVIDER_SITE_OTHER): Payer: BC Managed Care – PPO | Admitting: Orthopaedic Surgery

## 2020-06-22 VITALS — BP 141/87 | HR 77 | Ht 65.0 in | Wt 200.0 lb

## 2020-06-22 DIAGNOSIS — G8929 Other chronic pain: Secondary | ICD-10-CM | POA: Diagnosis not present

## 2020-06-22 DIAGNOSIS — M25561 Pain in right knee: Secondary | ICD-10-CM

## 2020-06-22 DIAGNOSIS — M65331 Trigger finger, right middle finger: Secondary | ICD-10-CM

## 2020-06-22 NOTE — Progress Notes (Signed)
Office Visit Note   Patient: Lisa Casey           Date of Birth: [birth year]           MRN: [id number] Visit Date: 06/22/2020              Requested by: [name], [address] [place],  [place] [address] PCP: [name], FNP   Assessment & Plan: Visit Diagnoses:  1. Chronic pain of right knee   2. Trigger finger, right middle finger     Plan: We will set up for physical therapy for right knee symptoms with chondromalacia patella.  We can work on her isometric quads, straight leg raising with resistance and terminal arc quad sets etc.  For her right trigger finger dorsal splint applied she can use as most the time she is having persistent problems with it with we will consider trigger finger injection on return visit.  Follow-Up Instructions: Return in about 4 weeks (around 07/20/2020).   Orders:  Orders Placed This Encounter  Procedures  . XR KNEE 3 VIEW RIGHT   No orders of the defined types were placed in this encounter.     Procedures: No procedures performed   Clinical Data: No additional findings.   Subjective: Chief Complaint  Patient presents with  . Right Shoulder - Pain  . Right Hand - Pain  . 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  She states her knee aches after 3 hours she has problems going up steps with the right foot first no problems with the left.  She describes achiness.  It is not giving way sometimes it feels like it slightly catches.  Patient is also had problems with the right hand with tenderness over the metacarpals and she is noted to triggering off and on of the long finger.  She gets some numbness and tingling in her hands she wears gloves while she drives.  She is used some Tylenol.  Review of Systems   Objective: Vital Signs: BP (!) 141/87 (BP Location: Left Arm, Patient Position: Sitting, Cuff Size: Large)    Pulse 77   Ht 5\' 5"  (1.651 m)   Wt 200 lb (90.7 kg)   LMP 08/18/2011   BMI 33.28 kg/m   Physical Exam Constitutional:      Appearance: She is well-developed.  HENT:     Head: Normocephalic.     Right Ear: External ear normal.     Left Ear: External ear normal.  Eyes:     Pupils: Pupils are equal, round, and reactive to light.  Neck:     Thyroid: No thyromegaly.     Trachea: No tracheal deviation.  Cardiovascular:     Rate and Rhythm: Normal rate.  Pulmonary:     Effort: Pulmonary effort is normal.  Abdominal:     Palpations: Abdomen is soft.  Skin:    General: Skin is warm and dry.  Neurological:     Mental Status: She is alert and oriented to person, place, and time.  Psychiatric:        Behavior: Behavior normal.     Ortho Exam patient has some tenderness over the A1 pulleys mild catching no definite triggering.  No triggering or other digits.  Some tenderness over the metacarpal heads.  No tenderness over the A2 or A4 pulleys.  Two-point sensation the  fingertips is intact good cervical range of motion elbow reaches full extension.  She has some difficulty with step up on the right leg but not on the left.  Mild crepitus with knee extension no subluxation of the patella collateral cruciate ligament exam is normal.  No medial plica.  Some increased pain with patellar compression and quadriceps contracture.  Specialty Comments:  No specialty comments available.  Imaging: No results found.   PMFS History: Patient Active Problem List   Diagnosis Date Noted  . Trigger finger, right middle finger 06/22/2020  . Leiomyoma of uterus, unspecified 09/14/2011   Past Medical History:  Diagnosis Date  . Anxiety    HX OF ANXIETY ATTACKS  . Back pain, chronic   . Fibromyalgia    PT ON LYRIC   . GERD (gastroesophageal reflux disease)    NOT ON ANY MEDS AT PRESENT  . Headache(784.0)    HX OF MIGRAINES  . Hypertension    HX OF ELEVATED B/P FOR SEVERAL YRS--NO MEDS UNTIL  LAST WEEK--PT WAS C/O OF CHEST PRESSURE AND B/P ELEVATED--PT SAW NURSE PRACTIONER ON 08/21/11 AND STATES EKG WAS DONE--TOLD ABNORMAL--PUT ON B/P MEDICATION LISINOPRIL AND TOLD SHE WAS TO SEE A CARDIOLOGIST.  [name] Shortness of breath    WITH EXERTION--NEW FOR PT--PT ALSO REPORTS FEELING TIRED    History reviewed. No pertinent family history.  Past Surgical History:  Procedure Laterality Date  . CONSTIPATION    . CYSTOSCOPY W/ URETERAL STENT PLACEMENT  09/23/2012   Procedure: CYSTOSCOPY WITH RETROGRADE PYELOGRAM/URETERAL STENT PLACEMENT;  Surgeon: Alexis Frock, MD;  Location: [facility];  Service: Urology;  Laterality: Right;  . CYSTOSCOPY WITH RETROGRADE PYELOGRAM, URETEROSCOPY AND STENT PLACEMENT  10/08/2012   Procedure: CYSTOSCOPY WITH RETROGRADE PYELOGRAM, URETEROSCOPY AND STENT PLACEMENT;  Surgeon: Alexis Frock, MD;  Location[facility];  Service: Urology;  Laterality: Right;  90 MIN NO RETROGRADE NEEDS FLEX URETEROSCOPE   . HOLMIUM LASER APPLICATION  0/35/0093   Procedure: HOLMIUM LASER APPLICATION;  Surgeon: Alexis Frock, MD;  Location: [facility];  Service: Urology;  Laterality: Right;  . LEFT HEART CATHETERIZATION WITH CORONARY ANGIOGRAM N/A 10/29/2011   Procedure: LEFT HEART CATHETERIZATION WITH CORONARY ANGIOGRAM;  Surgeon: Leonie Man, MD;  Location: [facility] CATH LAB;  Service: Cardiovascular;  Laterality: N/A;  . ROBOTIC ASSISTED LAP VAGINAL HYSTERECTOMY  09/14/2011   Procedure: ROBOTIC ASSISTED LAPAROSCOPIC VAGINAL HYSTERECTOMY;  Surgeon: Agnes Lawrence, MD;  Location: [facility];  Service: Gynecology;  Laterality: N/A;  . TUBAL LIGATION     Social History   Occupational History  . Not on file  Tobacco Use  . Smoking status: Never Smoker  . Smokeless tobacco: Never Used  Substance and Sexual Activity  . Alcohol use: No  . Drug use: No  . Sexual activity: Yes    Birth control/protection: Other-see comments    Comment: hysterectomy

## 2020-06-27 ENCOUNTER — Other Ambulatory Visit: Payer: Self-pay | Admitting: Nurse Practitioner

## 2020-06-29 ENCOUNTER — Other Ambulatory Visit: Payer: Self-pay | Admitting: Nurse Practitioner

## 2020-06-29 NOTE — Telephone Encounter (Signed)
Cyclobenzaprine refill 

## 2020-07-05 ENCOUNTER — Other Ambulatory Visit: Payer: Self-pay | Admitting: Nurse Practitioner

## 2020-07-05 DIAGNOSIS — R632 Polyphagia: Secondary | ICD-10-CM

## 2020-07-05 MED ORDER — LISDEXAMFETAMINE DIMESYLATE 20 MG PO CAPS: 20.0000 mg | ORAL_CAPSULE | Freq: Every day | ORAL | 0 refills | Status: DC

## 2020-07-12 ENCOUNTER — Ambulatory Visit: Payer: BC Managed Care – PPO | Admitting: Nurse Practitioner

## 2020-07-20 ENCOUNTER — Ambulatory Visit: Payer: BC Managed Care – PPO | Admitting: Orthopaedic Surgery

## 2020-07-28 ENCOUNTER — Encounter: Payer: Self-pay | Admitting: Nurse Practitioner

## 2020-07-28 LAB — HM COLONOSCOPY

## 2020-08-02 ENCOUNTER — Other Ambulatory Visit: Payer: Self-pay | Admitting: Nurse Practitioner

## 2020-08-02 DIAGNOSIS — M797 Fibromyalgia: Secondary | ICD-10-CM

## 2020-08-03 ENCOUNTER — Telehealth: Payer: Self-pay

## 2020-08-03 ENCOUNTER — Other Ambulatory Visit: Payer: Self-pay | Admitting: Nurse Practitioner

## 2020-08-03 DIAGNOSIS — F419 Anxiety disorder, unspecified: Secondary | ICD-10-CM

## 2020-08-03 DIAGNOSIS — R632 Polyphagia: Secondary | ICD-10-CM

## 2020-08-03 DIAGNOSIS — R4586 Emotional lability: Secondary | ICD-10-CM

## 2020-08-03 MED ORDER — LISDEXAMFETAMINE DIMESYLATE 20 MG PO CAPS: 20.0000 mg | ORAL_CAPSULE | Freq: Every day | ORAL | 0 refills | Status: DC

## 2020-08-03 MED ORDER — HYDROXYZINE PAMOATE 25 MG PO CAPS: 25.0000 mg | ORAL_CAPSULE | Freq: Four times a day (QID) | ORAL | 2 refills | Status: DC | PRN

## 2020-08-03 MED ORDER — PAROXETINE HCL 20 MG PO TABS: 20.0000 mg | ORAL_TABLET | Freq: Every day | ORAL | 1 refills | Status: DC

## 2020-08-03 NOTE — Telephone Encounter (Signed)
Refill request Flexeril,vyvanse, paxil

## 2020-08-03 NOTE — Telephone Encounter (Signed)
Pregabalin refill

## 2020-08-09 ENCOUNTER — Encounter: Payer: Self-pay | Admitting: Nurse Practitioner

## 2020-08-09 ENCOUNTER — Other Ambulatory Visit: Payer: Self-pay

## 2020-08-09 ENCOUNTER — Ambulatory Visit (INDEPENDENT_AMBULATORY_CARE_PROVIDER_SITE_OTHER): Payer: BC Managed Care – PPO | Admitting: Nurse Practitioner

## 2020-08-09 VITALS — BP 124/80 | HR 88 | Temp 98.6°F | Ht 64.2 in | Wt 204.4 lb

## 2020-08-09 DIAGNOSIS — R7309 Other abnormal glucose: Secondary | ICD-10-CM | POA: Diagnosis not present

## 2020-08-09 DIAGNOSIS — I10 Essential (primary) hypertension: Secondary | ICD-10-CM | POA: Diagnosis not present

## 2020-08-09 DIAGNOSIS — E669 Obesity, unspecified: Secondary | ICD-10-CM | POA: Diagnosis not present

## 2020-08-09 DIAGNOSIS — R632 Polyphagia: Secondary | ICD-10-CM | POA: Diagnosis not present

## 2020-08-09 NOTE — Progress Notes (Signed)
I,Yamilka [employer] as a Education administrator for [employer], FNP.,have documented all relevant documentation on the behalf of [name], FNP,as directed by  [name], FNP while in the presence of [name], [place]. This visit occurred during the SARS-CoV-2 public health emergency.  Safety protocols were in place, including screening questions prior to the visit, additional usage of staff PPE, and extensive cleaning of exam room while observing appropriate contact time as indicated for disinfecting solutions.  Subjective:     Patient ID: Lisa Casey , female    DOB: 1960-11-12 , 59 y.o.   MRN: [id number]   Chief Complaint  Patient presents with  . Weight Check    HPI  Patient here for a weight check.  Wt Readings from Last 3 Encounters: 08/09/20 : 204 lb 6.4 oz (92.7 kg) 06/22/20 : 200 lb (90.7 kg) 06/08/20 : 205 lb (93 kg)  She feels she is more attentive.  She is taking vyvanse.  She can go all day without eating.  She is not walking.  She has been looking at a treadmill, she has found one she can afford.  She is afraid of walking outside due to the animals and cars.  She feels she has been more mindful about what she is eating. She would like to wait until her next visit. She feels she has been more calm and she is reading more.     Past Medical History:  Diagnosis Date  . Anxiety    HX OF ANXIETY ATTACKS  . Back pain, chronic   . Fibromyalgia    PT ON LYRIC   . GERD (gastroesophageal reflux disease)    NOT ON ANY MEDS AT PRESENT  . Headache(784.0)    HX OF MIGRAINES  . Hypertension    HX OF ELEVATED B/P FOR SEVERAL YRS--NO MEDS UNTIL LAST WEEK--PT WAS C/O OF CHEST PRESSURE AND B/P ELEVATED--PT SAW NURSE PRACTIONER ON 08/21/11 AND STATES EKG WAS DONE--TOLD ABNORMAL--PUT ON B/P MEDICATION LISINOPRIL AND TOLD SHE WAS TO SEE A CARDIOLOGIST.  [name] Shortness of breath    WITH EXERTION--NEW FOR PT--PT ALSO REPORTS FEELING TIRED     History reviewed. No pertinent family  history.   Current Outpatient Medications:  .  amLODipine (NORVASC) 2.5 MG tablet, Take 1 tablet by mouth once daily, Disp: 90 tablet, Rfl: 0 .  cyclobenzaprine (FLEXERIL) 5 MG tablet, Take 1 tablet by mouth three times daily as needed for muscle spasm, Disp: 40 tablet, Rfl: 0 .  fluticasone (FLONASE) 50 MCG/ACT nasal spray, Place 2 sprays into both nostrils daily., Disp: 16 g, Rfl: 0 .  hydrOXYzine (VISTARIL) 25 MG capsule, Take 1 capsule (25 mg total) by mouth every 6 (six) hours as needed., Disp: 30 capsule, Rfl: 2 .  lisdexamfetamine (VYVANSE) 20 MG capsule, Take 1 capsule (20 mg total) by mouth daily., Disp: 30 capsule, Rfl: 0 .  PARoxetine (PAXIL) 20 MG tablet, Take 1 tablet (20 mg total) by mouth daily., Disp: 90 tablet, Rfl: 1 .  pregabalin (LYRICA) 150 MG capsule, Take 1 capsule by mouth twice daily, Disp: 60 capsule, Rfl: 0   Allergies  Allergen Reactions  . Codeine Itching    With rash     Review of Systems  Constitutional: Negative.   Respiratory: Negative.  Negative for cough.   Cardiovascular: Negative.  Negative for chest pain, palpitations and leg swelling.  Neurological: Negative for dizziness and headaches.  Psychiatric/Behavioral: Negative.      Today's Vitals   08/09/20 1525  BP: 124/80  Pulse: 88  Temp: 98.6 F (37 C)  TempSrc: Oral  Weight: 204 lb 6.4 oz (92.7 kg)  Height: 5' 4.2" (1.631 m)  PainSc: 0-No pain   Body mass index is 34.87 kg/m.   Objective:  Physical Exam Constitutional:      General: She is not in acute distress.    Appearance: Normal appearance. She is obese.  Cardiovascular:     Pulses: Normal pulses.     Heart sounds: Normal heart sounds. No murmur heard.   Pulmonary:     Effort: Pulmonary effort is normal. No respiratory distress.     Breath sounds: Normal breath sounds. No wheezing.  Neurological:     General: No focal deficit present.     Mental Status: She is alert and oriented to person, place, and time.     Cranial  Nerves: No cranial nerve deficit.  Psychiatric:        Mood and Affect: Mood normal.        Behavior: Behavior normal.        Thought Content: Thought content normal.        Judgment: Judgment normal.         Assessment And Plan:     1. Essential hypertension  Chronic, well controlled  Continue with current medications  Will check kidney functions - CMP14+EGFR  2. Abnormal glucose  Chronic, stable  Pending results will consider medications - Hemoglobin A1c - CMP14+EGFR  3. Obesity (BMI 30-39.9)  Chronic, no changes to weight however she feels her eating has cut back  4. Binge eating  She is tolerating Vyvanse well, does feel this curbs her appetite.    Patient was given opportunity to ask questions. Patient verbalized understanding of the plan and was able to repeat key elements of the plan. All questions were answered to their satisfaction.  [name], FNP   I, [name], FNP, have reviewed all documentation for this visit. The documentation on 08/16/20 for the exam, diagnosis, procedures, and orders are all accurate and complete.   THE PATIENT IS ENCOURAGED TO PRACTICE SOCIAL DISTANCING DUE TO THE COVID-19 PANDEMIC.

## 2020-08-09 NOTE — Patient Instructions (Addendum)

## 2020-08-10 LAB — CMP14+EGFR
ALT: 16 IU/L (ref 0–32)
AST: 19 IU/L (ref 0–40)
Albumin/Globulin Ratio: 1.5 (ref 1.2–2.2)
Albumin: 4.3 g/dL (ref 3.8–4.9)
Alkaline Phosphatase: 83 IU/L (ref 44–121)
BUN/Creatinine Ratio: 16 (ref 9–23)
BUN: 18 mg/dL (ref 6–24)
Bilirubin Total: 0.5 mg/dL (ref 0.0–1.2)
CO2: 26 mmol/L (ref 20–29)
Calcium: 9.3 mg/dL (ref 8.7–10.2)
Chloride: 106 mmol/L (ref 96–106)
Creatinine, Ser: 1.14 mg/dL — ABNORMAL HIGH (ref 0.57–1.00)
GFR calc Af Amer: 61 mL/min/{1.73_m2} (ref >59)
GFR calc non Af Amer: 53 mL/min/{1.73_m2} — ABNORMAL LOW (ref >59)
Globulin, Total: 2.8 g/dL (ref 1.5–4.5)
Glucose: 94 mg/dL (ref 65–99)
Potassium: 4.3 mmol/L (ref 3.5–5.2)
Sodium: 145 mmol/L — ABNORMAL HIGH (ref 134–144)
Total Protein: 7.1 g/dL (ref 6.0–8.5)

## 2020-08-10 LAB — HEMOGLOBIN A1C
Est. average glucose Bld gHb Est-mCnc: 134 mg/dL
Hgb A1c MFr Bld: 6.3 % — ABNORMAL HIGH (ref 4.8–5.6)

## 2020-09-05 ENCOUNTER — Other Ambulatory Visit: Payer: Self-pay | Admitting: Nurse Practitioner

## 2020-09-05 DIAGNOSIS — M797 Fibromyalgia: Secondary | ICD-10-CM

## 2020-09-05 DIAGNOSIS — R4586 Emotional lability: Secondary | ICD-10-CM

## 2020-09-05 NOTE — Telephone Encounter (Signed)
Refills. Cant separate

## 2020-09-13 ENCOUNTER — Ambulatory Visit (INDEPENDENT_AMBULATORY_CARE_PROVIDER_SITE_OTHER): Payer: BC Managed Care – PPO

## 2020-09-13 ENCOUNTER — Encounter (HOSPITAL_COMMUNITY): Payer: Self-pay

## 2020-09-13 ENCOUNTER — Ambulatory Visit (INDEPENDENT_AMBULATORY_CARE_PROVIDER_SITE_OTHER): Payer: BC Managed Care – PPO | Admitting: Nurse Practitioner

## 2020-09-13 ENCOUNTER — Other Ambulatory Visit: Payer: Self-pay

## 2020-09-13 ENCOUNTER — Ambulatory Visit (HOSPITAL_COMMUNITY)
Admission: EM | Admit: 2020-09-13 | Discharge: 2020-09-13 | Disposition: A | Discharge status: Home / Self Care | Payer: BC Managed Care – PPO | Attending: Student | Admitting: Student

## 2020-09-13 ENCOUNTER — Encounter: Payer: Self-pay | Admitting: Nurse Practitioner

## 2020-09-13 ENCOUNTER — Other Ambulatory Visit: Payer: BC Managed Care – PPO

## 2020-09-13 VITALS — BP 130/72 | HR 85 | Temp 97.5°F

## 2020-09-13 DIAGNOSIS — R059 Cough, unspecified: Secondary | ICD-10-CM | POA: Diagnosis not present

## 2020-09-13 DIAGNOSIS — R0602 Shortness of breath: Secondary | ICD-10-CM

## 2020-09-13 DIAGNOSIS — J209 Acute bronchitis, unspecified: Secondary | ICD-10-CM

## 2020-09-13 DIAGNOSIS — Z20822 Contact with and (suspected) exposure to covid-19: Secondary | ICD-10-CM | POA: Diagnosis not present

## 2020-09-13 MED ORDER — ALBUTEROL SULFATE HFA 108 (90 BASE) MCG/ACT IN AERS: 2.0000 | INHALATION_SPRAY | Freq: Four times a day (QID) | RESPIRATORY_TRACT | 2 refills | Status: DC | PRN

## 2020-09-13 MED ORDER — BENZONATATE 100 MG PO CAPS: 100.0000 mg | ORAL_CAPSULE | Freq: Four times a day (QID) | ORAL | 1 refills | Status: DC | PRN

## 2020-09-13 NOTE — Discharge Instructions (Signed)
-  For your bronchitis, take the Tessalon and Albuterol as prescribed by your PCP.  -Seek additional medical attention if you have worsening of your shortness of breath despite this treatment plan; if you have chest pain; new/worsening fevers; etc.

## 2020-09-13 NOTE — ED Provider Notes (Signed)
[place]    CSN: [id number] Arrival date & time: 09/13/20  1318      History   Chief Complaint Chief Complaint  Patient presents with  . Cough  . Headache  . Generalized Body Aches         HPI Lisa Casey is a 59 y.o. female presenting for cough, headache, and generalized body aches. History of anxiety, fibromyalgia, back pain, GERD, headache, hypertension, shortness of breath with exertion. She was seen by PCP earlier today for same symptoms. Per their note-' She reports having a cough on Friday 12/24, she spent time with family over the weekend as well. She then had a headache and body aches.  She has had her covid vaccine with booster (one month ago).  She has been taking robitussin DM.  She is having some chest tightness and dyspnea when taking a deep breath and the persistent coughing.' they prescribed her albuterol and tessalon. She is fully vaccinated for covid19. Pt reports her PCP requested for her to have chest x-ray done. Pt states she was tested for covid this morning but does not know results yet. Pt states her PCP is concerned she may have Pneumonia. Denies chest pain, pain down arms, jaw pain, n/v/d.   HPI  Past Medical History:  Diagnosis Date  . Anxiety    HX OF ANXIETY ATTACKS  . Back pain, chronic   . Fibromyalgia    PT ON LYRIC   . GERD (gastroesophageal reflux disease)    NOT ON ANY MEDS AT PRESENT  . Headache(784.0)    HX OF MIGRAINES  . Hypertension    HX OF ELEVATED B/P FOR SEVERAL YRS--NO MEDS UNTIL LAST WEEK--PT WAS C/O OF CHEST PRESSURE AND B/P ELEVATED--PT SAW NURSE PRACTIONER ON 08/21/11 AND STATES EKG WAS DONE--TOLD ABNORMAL--PUT ON B/P MEDICATION LISINOPRIL AND TOLD SHE WAS TO SEE A CARDIOLOGIST.  [name] Shortness of breath    WITH EXERTION--NEW FOR PT--PT ALSO REPORTS FEELING TIRED    Patient Active Problem List   Diagnosis Date Noted  . Essential hypertension 08/09/2020  . Abnormal glucose 08/09/2020  . Obesity (BMI 30-39.9)  08/09/2020  . Trigger finger, right middle finger 06/22/2020  . Leiomyoma of uterus, unspecified 09/14/2011    Past Surgical History:  Procedure Laterality Date  . CONSTIPATION    . CYSTOSCOPY W/ URETERAL STENT PLACEMENT  09/23/2012   Procedure: CYSTOSCOPY WITH RETROGRADE PYELOGRAM/URETERAL STENT PLACEMENT;  Surgeon: Alexis Frock, MD;  Location: [facility];  Service: Urology;  Laterality: Right;  . CYSTOSCOPY WITH RETROGRADE PYELOGRAM, URETEROSCOPY AND STENT PLACEMENT  10/08/2012   Procedure: CYSTOSCOPY WITH RETROGRADE PYELOGRAM, URETEROSCOPY AND STENT PLACEMENT;  Surgeon: Alexis Frock, MD;  Location: [facility];  Service: Urology;  Laterality: Right;  90 MIN NO RETROGRADE NEEDS FLEX URETEROSCOPE   . HOLMIUM LASER APPLICATION  [id number]   Procedure: HOLMIUM LASER APPLICATION;  Surgeon: Alexis Frock, MD;  Location: [facility];  Service: Urology;  Laterality: Right;  . LEFT HEART CATHETERIZATION WITH CORONARY ANGIOGRAM N/A 10/29/2011   Procedure: LEFT HEART CATHETERIZATION WITH CORONARY ANGIOGRAM;  Surgeon: Leonie Man, MD;  Location: [facility];  Service: Cardiovascular;  Laterality: N/A;  . ROBOTIC ASSISTED LAP VAGINAL HYSTERECTOMY  09/14/2011   Procedure: ROBOTIC ASSISTED LAPAROSCOPIC VAGINAL HYSTERECTOMY;  Surgeon: Agnes Lawrence, MD;  Location: [facility];  Service: Gynecology;  Laterality: N/A;  . TUBAL LIGATION      OB History   No obstetric history on file.  Home Medications    Prior to Admission medications   Medication Sig Start Date End Date Taking? Authorizing Provider  amLODipine (NORVASC) 2.5 MG tablet Take 1 tablet by mouth once daily 09/05/20  Yes [name], FNP  cyclobenzaprine (FLEXERIL) 5 MG tablet Take 1 tablet by mouth three times daily as needed for muscle spasm 09/05/20  Yes [name], FNP  lisdexamfetamine (VYVANSE) 20 MG capsule Take 1 capsule (20 mg total) by mouth daily. 08/03/20  Yes [name], FNP   PARoxetine (PAXIL) 20 MG tablet Take 1 tablet by mouth once daily 09/05/20  Yes [name], FNP  pregabalin (LYRICA) 150 MG capsule Take 1 capsule by mouth twice daily 09/05/20  Yes [name], FNP  albuterol (VENTOLIN HFA) 108 (90 Base) MCG/ACT inhaler Inhale 2 puffs into the lungs every 6 (six) hours as needed for wheezing or shortness of breath. 09/13/20   [name], FNP  benzonatate (TESSALON PERLES) 100 MG capsule Take 1 capsule (100 mg total) by mouth every 6 (six) hours as needed. 09/13/20 09/13/21  [name], FNP  fluticasone (FLONASE) 50 MCG/ACT nasal spray Place 2 sprays into both nostrils daily. 11/05/18   [place], [place], PA-C  hydrOXYzine (VISTARIL) 25 MG capsule Take 1 capsule (25 mg total) by mouth every 6 (six) hours as needed. 08/03/20   [name], FNP    Family History History reviewed. No pertinent family history.  Social History Social History   Tobacco Use  . Smoking status: Never Smoker  . Smokeless tobacco: Never Used  Substance Use Topics  . Alcohol use: No  . Drug use: No     Allergies   Codeine   Review of Systems Review of Systems  Constitutional: Positive for chills.  HENT: Positive for congestion.   Respiratory: Positive for cough and shortness of breath.   Musculoskeletal: Positive for myalgias.  All other systems reviewed and are negative.    Physical Exam Triage Vital Signs ED Triage Vitals  Enc Vitals Group     BP 09/13/20 1615 (!) 150/79     Pulse Rate 09/13/20 1615 81     Resp 09/13/20 1615 16     Temp 09/13/20 1615 98.6 F (37 C)     Temp Source 09/13/20 1615 Oral     SpO2 09/13/20 1615 98 %     Weight --      Height --      Head Circumference --      Peak Flow --      Pain Score 09/13/20 1613 8     Pain Loc --      Pain Edu? --      Excl. in [place]? --    No data found.  Updated Vital Signs BP (!) 150/79 (BP Location: Left Arm)   Pulse 81   Temp 98.6 F (37 C) (Oral)   Resp 16   LMP 08/18/2011   SpO2  98%   Visual Acuity Right Eye Distance:   Left Eye Distance:   Bilateral Distance:    Right Eye Near:   Left Eye Near:    Bilateral Near:     Physical Exam Vitals reviewed.  Constitutional:      General: She is not in acute distress.    Appearance: Normal appearance. She is not ill-appearing.  HENT:     Head: Normocephalic and atraumatic.     Right Ear: Hearing, tympanic membrane, ear canal and external ear normal. No swelling or tenderness. There is no impacted cerumen. No  mastoid tenderness. Tympanic membrane is not perforated, erythematous, retracted or bulging.     Left Ear: Hearing, tympanic membrane, ear canal and external ear normal. No swelling or tenderness. There is no impacted cerumen. No mastoid tenderness. Tympanic membrane is not perforated, erythematous, retracted or bulging.     Nose:     Right Sinus: No maxillary sinus tenderness or frontal sinus tenderness.     Left Sinus: No maxillary sinus tenderness or frontal sinus tenderness.     Mouth/Throat:     Mouth: Mucous membranes are moist.     Pharynx: Uvula midline. No oropharyngeal exudate or posterior oropharyngeal erythema.     Tonsils: No tonsillar exudate.  Cardiovascular:     Rate and Rhythm: Normal rate and regular rhythm.     Heart sounds: Normal heart sounds.  Pulmonary:     Effort: Pulmonary effort is normal.     Breath sounds: Normal air entry. Decreased breath sounds present. No wheezing, rhonchi or rales.  Lymphadenopathy:     Cervical: No cervical adenopathy.  Neurological:     General: No focal deficit present.     Mental Status: She is alert and oriented to person, place, and time.  Psychiatric:        Attention and Perception: Attention and perception normal.        Mood and Affect: Mood and affect normal.        Behavior: Behavior normal. Behavior is cooperative.      UC Treatments / Results  Labs (all labs ordered are listed, but only abnormal results are displayed) Labs Reviewed -  No data to display  EKG   Radiology DG Chest 2 View  Result Date: 09/13/2020 CLINICAL DATA:  Shortness of breath and cough. EXAM: CHEST - 2 VIEW COMPARISON:  11/05/2018 FINDINGS: Heart size is normal. Chronic scarring in the mid and lower lungs, left more than right. No sign of active infiltrate, mass, effusion or collapse. Chronic scoliotic curvature of the spine. IMPRESSION: No active disease. Chronic pulmonary scarring, left more than right. Electronically Signed   By: Paulina Fusi M.D.   On: 09/13/2020 17:10    Procedures Procedures (including critical care time)  Medications Ordered in UC Medications - No data to display  Initial Impression / Assessment and Plan / UC Course  I have reviewed the triage vital signs and the nursing notes.  Pertinent labs & imaging results that were available during my care of the patient were reviewed by me and considered in my medical decision making (see chart for details).     Pt is afebrile nontachycardic nontachypneic, oxygenating well on room air. She was seen earlier today at PCP and at their office she was also afebrile nontachycardic nontachypneic, oxygenating well on room air. CXR today showing no active disease. She is fully vaccinated for covid-19. She was tested for covid at PCP earlier today and is awaiting results of this so deferred covid test at this appt. Isolation until negative result as per CDC guidelines. Reassurance provided.   She was already prescribed tessalon and albuterol by PCP earlier today, so I encouraged her to fill this script and start medications as directed.   Return precautions- chest pain, shortness of breath, new/worsening fevers/chills, confusion, worsening of symptoms despite the above treatment plan, etc.    Final Clinical Impressions(s) / UC Diagnoses   Final diagnoses:  Acute bronchitis, unspecified organism     Discharge Instructions     -For your bronchitis, take the Tessalon and Albuterol as  prescribed by your PCP.  -Seek additional medical attention if you have worsening of your shortness of breath despite this treatment plan; if you have chest pain; new/worsening fevers; etc.     ED Prescriptions    None     PDMP not reviewed this encounter.   Hazel Sams, PA-C 09/13/20 1720

## 2020-09-13 NOTE — Progress Notes (Signed)
I,[name],acting as a Neurosurgeon for [employer], FNP.,have documented all relevant documentation on the behalf of [name], FNP,as directed by  [name], FNP while in the presence of [name], FNP.  This visit occurred during the SARS-CoV-2 public health emergency.  Safety protocols were in place, including screening questions prior to the visit, additional usage of staff PPE, and extensive cleaning of exam room while observing appropriate contact time as indicated for disinfecting solutions.  Subjective:     Patient ID: Lisa Casey , female    DOB: September 28, 1960 , 59 y.o.   MRN: [id number]   Chief Complaint  Patient presents with  . Cough    HPI  She reports having a cough on Friday 12/24, she spent time with family over the weekend as well. She then had a headache and body aches.  She has had her covid vaccine with booster (one month ago).  She has been taking robitussin DM.  She is having some chest tightness and dyspnea when taking a deep breath and the persistent coughing.   Cough Associated symptoms include chest pain (chest tightness), headaches and shortness of breath (when taking a deep breath). Pertinent negatives include no wheezing.     Past Medical History:  Diagnosis Date  . Anxiety    HX OF ANXIETY ATTACKS  . Back pain, chronic   . Fibromyalgia    PT ON LYRIC   . GERD (gastroesophageal reflux disease)    NOT ON ANY MEDS AT PRESENT  . Headache(784.0)    HX OF MIGRAINES  . Hypertension    HX OF ELEVATED B/P FOR SEVERAL YRS--NO MEDS UNTIL LAST WEEK--PT WAS C/O OF CHEST PRESSURE AND B/P ELEVATED--PT SAW NURSE PRACTIONER ON 08/21/11 AND STATES EKG WAS DONE--TOLD ABNORMAL--PUT ON B/P MEDICATION LISINOPRIL AND TOLD SHE WAS TO SEE A CARDIOLOGIST.  [name] Shortness of breath    WITH EXERTION--NEW FOR PT--PT ALSO REPORTS FEELING TIRED     History reviewed. No pertinent family history.   Current Outpatient Medications:  .  albuterol (VENTOLIN HFA) 108 (90  Base) MCG/ACT inhaler, Inhale 2 puffs into the lungs every 6 (six) hours as needed for wheezing or shortness of breath., Disp: 8 g, Rfl: 2 .  benzonatate (TESSALON PERLES) 100 MG capsule, Take 1 capsule (100 mg total) by mouth every 6 (six) hours as needed., Disp: 30 capsule, Rfl: 1 .  amLODipine (NORVASC) 2.5 MG tablet, Take 1 tablet by mouth once daily, Disp: 90 tablet, Rfl: 0 .  cyclobenzaprine (FLEXERIL) 5 MG tablet, Take 1 tablet by mouth three times daily as needed for muscle spasm, Disp: 40 tablet, Rfl: 0 .  fluticasone (FLONASE) 50 MCG/ACT nasal spray, Place 2 sprays into both nostrils daily., Disp: 16 g, Rfl: 0 .  hydrOXYzine (VISTARIL) 25 MG capsule, Take 1 capsule (25 mg total) by mouth every 6 (six) hours as needed., Disp: 30 capsule, Rfl: 2 .  lisdexamfetamine (VYVANSE) 20 MG capsule, Take 1 capsule (20 mg total) by mouth daily., Disp: 30 capsule, Rfl: 0 .  PARoxetine (PAXIL) 20 MG tablet, Take 1 tablet by mouth once daily, Disp: 90 tablet, Rfl: 0 .  pregabalin (LYRICA) 150 MG capsule, Take 1 capsule by mouth twice daily, Disp: 60 capsule, Rfl: 0   Allergies  Allergen Reactions  . Codeine Itching    With rash     Review of Systems  Constitutional: Negative.   Respiratory: Positive for cough and shortness of breath (when taking a deep breath). Negative for wheezing.  Cardiovascular: Positive for chest pain (chest tightness).  Gastrointestinal: Negative.   Neurological: Positive for headaches.  Psychiatric/Behavioral: Negative.      Today's Vitals   09/13/20 1122  BP: 130/72  Pulse: 85  Temp: (!) 97.5 F (36.4 C)  TempSrc: Oral   There is no height or weight on file to calculate BMI.   Objective:  Physical Exam Constitutional:      General: She is not in acute distress.    Appearance: Normal appearance.  Cardiovascular:     Rate and Rhythm: Normal rate and regular rhythm.     Pulses: Normal pulses.     Heart sounds: Normal heart sounds. No murmur  heard.   Pulmonary:     Effort: Pulmonary effort is normal. No respiratory distress.     Breath sounds: Decreased breath sounds (throughout) present. No wheezing.     Comments: Frequent dry hacking cough noted during exam Neurological:     Mental Status: She is alert.  Psychiatric:        Mood and Affect: Mood normal.        Behavior: Behavior normal.        Thought Content: Thought content normal.        Judgment: Judgment normal.         Assessment And Plan:     1. Cough in adult  Albuterol inhaler ordered, at this time I am hesitant to start a steroid until we have her covid results as she is less than 5 days with symptoms. Also sent prescription for tessalon perles  She is to go for CXR as well - albuterol (VENTOLIN HFA) 108 (90 Base) MCG/ACT inhaler; Inhale 2 puffs into the lungs every 6 (six) hours as needed for wheezing or shortness of breath.  Dispense: 8 g; Refill: 2 - benzonatate (TESSALON PERLES) 100 MG capsule; Take 1 capsule (100 mg total) by mouth every 6 (six) hours as needed.  Dispense: 30 capsule; Refill: 1 - DG Chest 2 View; Future  2. Suspected COVID-19 virus infection  Patient evaluated, tested and sent home with instructions for home care and Quarantine. Instructed to seek further care if symptoms worsen.  - Novel Coronavirus, NAA ([facility]) - benzonatate (TESSALON PERLES) 100 MG capsule; Take 1 capsule (100 mg total) by mouth every 6 (six) hours as needed.  Dispense: 30 capsule; Refill: 1     Patient was given opportunity to ask questions. Patient verbalized understanding of the plan and was able to repeat key elements of the plan. All questions were answered to their satisfaction.  [name], FNP   I, [name], FNP, have reviewed all documentation for this visit. The documentation on 09/14/20 for the exam, diagnosis, procedures, and orders are all accurate and complete.   THE PATIENT IS ENCOURAGED TO PRACTICE SOCIAL DISTANCING DUE TO THE COVID-19  PANDEMIC.

## 2020-09-13 NOTE — Patient Instructions (Signed)

## 2020-09-13 NOTE — ED Triage Notes (Signed)
Pt reports her PCP requested for her to have chest x-ray done. Pt states she was tested for covid this morning but does not know results yet. Pt states her PCP is concerned she may have Pneumonia.

## 2020-09-14 ENCOUNTER — Encounter: Payer: Self-pay | Admitting: Nurse Practitioner

## 2020-09-14 LAB — NOVEL CORONAVIRUS, NAA: SARS-CoV-2, NAA: NOT DETECTED

## 2020-09-14 LAB — SARS-COV-2, NAA 2 DAY TAT

## 2020-09-18 NOTE — Progress Notes (Signed)
We have spoken with the patient in reference to this question

## 2020-09-22 ENCOUNTER — Other Ambulatory Visit: Payer: Self-pay | Admitting: Nurse Practitioner

## 2020-09-22 DIAGNOSIS — R632 Polyphagia: Secondary | ICD-10-CM

## 2020-09-22 MED ORDER — LISDEXAMFETAMINE DIMESYLATE 20 MG PO CAPS: 20.0000 mg | ORAL_CAPSULE | Freq: Every day | ORAL | 0 refills | Status: DC

## 2020-10-06 ENCOUNTER — Telehealth: Payer: Self-pay

## 2020-10-06 NOTE — Telephone Encounter (Signed)
Patient consented to virtual appt YL,RMA

## 2020-10-07 ENCOUNTER — Other Ambulatory Visit: Payer: Self-pay

## 2020-10-07 ENCOUNTER — Telehealth (INDEPENDENT_AMBULATORY_CARE_PROVIDER_SITE_OTHER): Payer: BC Managed Care – PPO | Admitting: Nurse Practitioner

## 2020-10-07 ENCOUNTER — Encounter: Payer: Self-pay | Admitting: Nurse Practitioner

## 2020-10-07 VITALS — Ht 64.2 in | Wt 210.0 lb

## 2020-10-07 DIAGNOSIS — F419 Anxiety disorder, unspecified: Secondary | ICD-10-CM

## 2020-10-07 DIAGNOSIS — M797 Fibromyalgia: Secondary | ICD-10-CM

## 2020-10-07 DIAGNOSIS — Z6835 Body mass index (BMI) 35.0-35.9, adult: Secondary | ICD-10-CM

## 2020-10-07 DIAGNOSIS — R632 Polyphagia: Secondary | ICD-10-CM

## 2020-10-07 DIAGNOSIS — E669 Obesity, unspecified: Secondary | ICD-10-CM

## 2020-10-07 DIAGNOSIS — R0982 Postnasal drip: Secondary | ICD-10-CM

## 2020-10-07 MED ORDER — NOREL AD 4-10-325 MG PO TABS: 1.0000 | ORAL_TABLET | Freq: Two times a day (BID) | ORAL | 2 refills | Status: DC | PRN

## 2020-10-07 MED ORDER — CYCLOBENZAPRINE HCL 5 MG PO TABS: 5.0000 mg | ORAL_TABLET | Freq: Three times a day (TID) | ORAL | 1 refills | Status: DC | PRN

## 2020-10-07 MED ORDER — PREGABALIN 150 MG PO CAPS: 150.0000 mg | ORAL_CAPSULE | Freq: Two times a day (BID) | ORAL | 3 refills | Status: DC

## 2020-10-07 NOTE — Progress Notes (Signed)
Virtual Visit via MyChart   This visit type was conducted due to national recommendations for restrictions regarding the COVID-19 Pandemic (e.g. social distancing) in an effort to limit this patient's exposure and mitigate transmission in our community.  Due to her co-morbid illnesses, this patient is at least at moderate risk for complications without adequate follow up.  This format is felt to be most appropriate for this patient at this time.  All issues noted in this document were discussed and addressed.  A limited physical exam was performed with this format.    This visit type was conducted due to national recommendations for restrictions regarding the COVID-19 Pandemic (e.g. social distancing) in an effort to limit this patient's exposure and mitigate transmission in our community.  Patients identity confirmed using two different identifiers.  This format is felt to be most appropriate for this patient at this time.  All issues noted in this document were discussed and addressed.  No physical exam was performed (except for noted visual exam findings with Video Visits).    Date:  10/07/2020   ID:  Lisa Casey, DOB 1961-07-23, MRN [id number]  Patient Location:  Home - spoke with Lisa Casey  Provider location:   Office    Chief Complaint:  Weight check  History of Present Illness:    Lisa Casey is a 60 y.o. female who presents via video conferencing for a telehealth visit today.    The patient does not have symptoms concerning for COVID-19 infection (fever, chills, cough, or new shortness of breath).   Patient presents for a weight check. She is getting cravings late at night. She will dig around for junk food. She is on the treadmill 3 days a week. She is using a vibrating machine.  She admits to eating too much. She is taking vyvanse which is helping with her focus. It is mostly at night, after she takes her medications at night and will fix anything. She feels maybe  she needs to speak with a therapist  Wt Readings from Last 3 Encounters: 10/07/20 : 210 lb (95.3 kg) 08/09/20 : 204 lb 6.4 oz (92.7 kg) 06/22/20 : 200 lb (90.7 kg)    Past Medical History:  Diagnosis Date   Anxiety    HX OF ANXIETY ATTACKS   Back pain, chronic    Fibromyalgia    PT ON LYRIC    GERD (gastroesophageal reflux disease)    NOT ON ANY MEDS AT PRESENT   Headache(784.0)    HX OF MIGRAINES   Hypertension    HX OF ELEVATED B/P FOR SEVERAL YRS--NO MEDS UNTIL LAST WEEK--PT WAS C/O OF CHEST PRESSURE AND B/P ELEVATED--PT SAW NURSE PRACTIONER ON 08/21/11 AND STATES EKG WAS DONE--TOLD ABNORMAL--PUT ON B/P MEDICATION LISINOPRIL AND TOLD SHE WAS TO SEE A CARDIOLOGIST.   Shortness of breath    WITH EXERTION--NEW FOR PT--PT ALSO REPORTS FEELING TIRED   Past Surgical History:  Procedure Laterality Date   CONSTIPATION     CYSTOSCOPY W/ URETERAL STENT PLACEMENT  09/23/2012   Procedure: CYSTOSCOPY WITH RETROGRADE PYELOGRAM/URETERAL STENT PLACEMENT;  Surgeon: Sebastian Ache, MD;  Location: [facility];  Service: Urology;  Laterality: Right;   CYSTOSCOPY WITH RETROGRADE PYELOGRAM, URETEROSCOPY AND STENT PLACEMENT  10/08/2012   Procedure: CYSTOSCOPY WITH RETROGRADE PYELOGRAM, URETEROSCOPY AND STENT PLACEMENT;  Surgeon: Sebastian Ache, MD;  Location: [facility];  Service: Urology;  Laterality: Right;  90 MIN NO RETROGRADE NEEDS FLEX URETEROSCOPE    HOLMIUM LASER APPLICATION  10/08/2012   Procedure: HOLMIUM LASER APPLICATION;  Surgeon: Alexis Frock, MD;  Location: [facility];  Service: Urology;  Laterality: Right;   LEFT HEART CATHETERIZATION WITH CORONARY ANGIOGRAM N/A 10/29/2011   Procedure: LEFT HEART CATHETERIZATION WITH CORONARY ANGIOGRAM;  Surgeon: Leonie Man, MD;  Location: [facility];  Service: Cardiovascular;  Laterality: N/A;   ROBOTIC ASSISTED LAP VAGINAL HYSTERECTOMY  09/14/2011   Procedure: ROBOTIC ASSISTED LAPAROSCOPIC VAGINAL  HYSTERECTOMY;  Surgeon: Agnes Lawrence, MD;  Location: [facility];  Service: Gynecology;  Laterality: N/A;   TUBAL LIGATION       Current Meds  Medication Sig   amLODipine (NORVASC) 2.5 MG tablet Take 1 tablet by mouth once daily   benzonatate (TESSALON PERLES) 100 MG capsule Take 1 capsule (100 mg total) by mouth every 6 (six) hours as needed.   fluticasone (FLONASE) 50 MCG/ACT nasal spray Place 2 sprays into both nostrils daily.   hydrOXYzine (VISTARIL) 25 MG capsule Take 1 capsule (25 mg total) by mouth every 6 (six) hours as needed.   lisdexamfetamine (VYVANSE) 20 MG capsule Take 1 capsule (20 mg total) by mouth daily.   NOREL AD 4-10-325 MG TABS Take 1 tablet by mouth 2 (two) times daily as needed.   PARoxetine (PAXIL) 20 MG tablet Take 1 tablet by mouth once daily   [DISCONTINUED] cyclobenzaprine (FLEXERIL) 5 MG tablet Take 1 tablet by mouth three times daily as needed for muscle spasm   [DISCONTINUED] pregabalin (LYRICA) 150 MG capsule Take 1 capsule by mouth twice daily     Allergies:   Codeine   Social History   Tobacco Use   Smoking status: Never Smoker   Smokeless tobacco: Never Used  Substance Use Topics   Alcohol use: No   Drug use: No     Family Hx: The patient's family history is not on file.  ROS:   Please see the history of present illness.    Review of Systems  Constitutional: Negative.   Respiratory: Positive for cough (mostly at night). Negative for sputum production, shortness of breath and wheezing.   Cardiovascular: Negative.  Negative for chest pain.  Neurological: Negative for dizziness and tingling.  Psychiatric/Behavioral: Negative.  Negative for depression and memory loss. The patient is not nervous/anxious.     All other systems reviewed and are negative.   Labs/Other Tests and Data Reviewed:    Recent Labs: 11/03/2019: TSH 1.590 08/09/2020: ALT 16; BUN 18; Creatinine, Ser 1.14; Potassium 4.3; Sodium 145   Recent Lipid  Panel Lab Results  Component Value Date/Time   CHOL 148 11/03/2019 05:08 PM   TRIG 113 11/03/2019 05:08 PM   HDL 62 11/03/2019 05:08 PM   CHOLHDL 2.4 11/03/2019 05:08 PM   LDLCALC 66 11/03/2019 05:08 PM    Wt Readings from Last 3 Encounters:  10/07/20 210 lb (95.3 kg)  08/09/20 204 lb 6.4 oz (92.7 kg)  06/22/20 200 lb (90.7 kg)     Exam:    Vital Signs:  Ht 5' 4.2" (1.631 m)    Wt 210 lb (95.3 kg)    LMP 08/18/2011    BMI 35.82 kg/m     Physical Exam Vitals reviewed.  Constitutional:      General: She is not in acute distress. Cardiovascular:     Rate and Rhythm: Normal rate and regular rhythm.     Pulses: Normal pulses.     Heart sounds: Normal heart sounds. No murmur heard.   Pulmonary:  Effort: Pulmonary effort is normal. No respiratory distress.     Breath sounds: Normal breath sounds. No wheezing.  Neurological:     General: No focal deficit present.     Mental Status: She is alert and oriented to person, place, and time.     Cranial Nerves: No cranial nerve deficit.  Psychiatric:        Mood and Affect: Mood normal.        Behavior: Behavior normal.        Thought Content: Thought content normal.        Judgment: Judgment normal.     ASSESSMENT & PLAN:    1. Binge eating She is not doing well with her eating continues to have episodes of binge eating in spite of the Vyvanse, will refer to psychiatry and counseling - Ambulatory referral to Psychiatry  2. Class 2 obesity with body mass index (BMI) of 35.0 to 35.9 in adult, unspecified obesity type, unspecified whether serious comorbidity present Chronic, unfortunately she has had a 10 lb weight gain since October Encouraged to continue with exercise but may need to focus on mental health to get her over this challenge In the meantime will continue with Vyvanse   3. Fibromyalgia  Continue with lyrica and flexeril  Encouraged to stretch more - pregabalin (LYRICA) 150 MG capsule; Take 1 capsule (150  mg total) by mouth 2 (two) times daily.  Dispense: 60 capsule; Refill: 3 - cyclobenzaprine (FLEXERIL) 5 MG tablet; Take 1 tablet (5 mg total) by mouth 3 (three) times daily as needed. for muscle spams  Dispense: 40 tablet; Refill: 1  4. Post-nasal drainage  Treated for sinus infection 2 weeks ago  Continue allegra and norel ad at bedtime, if have to take more than 3 days call back to office - NOREL AD 4-10-325 MG TABS; Take 1 tablet by mouth 2 (two) times daily as needed.  Dispense: 60 tablet; Refill: 2  5. Anxiety  Feels is currently controlled however will refer to psychiatry to ensure not having underlying anxiety she is not aware of - Ambulatory referral to Psychiatry   COVID-19 Education: The signs and symptoms of COVID-19 were discussed with the patient and how to seek care for testing (follow up with PCP or arrange E-visit).  The importance of social distancing was discussed today.  Patient Risk:   After full review of this patients clinical status, I feel that they are at least moderate risk at this time.  Time:   Today, I have spent 11 minutes/ seconds with the patient with telehealth technology discussing above diagnoses.    Video connection was unable to complete due to error code, remainder of visit was done via telephone.    Medication Adjustments/Labs and Tests Ordered: Current medicines are reviewed at length with the patient today.  Concerns regarding medicines are outlined above.   Tests Ordered: Orders Placed This Encounter  Procedures   Ambulatory referral to Psychiatry    Medication Changes: Meds ordered this encounter  Medications   pregabalin (LYRICA) 150 MG capsule    Sig: Take 1 capsule (150 mg total) by mouth 2 (two) times daily.    Dispense:  60 capsule    Refill:  3   cyclobenzaprine (FLEXERIL) 5 MG tablet    Sig: Take 1 tablet (5 mg total) by mouth 3 (three) times daily as needed. for muscle spams    Dispense:  40 tablet    Refill:  1    NOREL AD 4-10-325 MG  TABS    Sig: Take 1 tablet by mouth 2 (two) times daily as needed.    Dispense:  60 tablet    Refill:  2    Disposition:  Follow up in 2 month(s)  Signed, [name], FNP

## 2020-10-27 ENCOUNTER — Other Ambulatory Visit: Payer: Self-pay | Admitting: Nurse Practitioner

## 2020-10-27 DIAGNOSIS — R632 Polyphagia: Secondary | ICD-10-CM

## 2020-10-27 MED ORDER — LISDEXAMFETAMINE DIMESYLATE 20 MG PO CAPS
20.0000 mg | ORAL_CAPSULE | Freq: Every day | ORAL | 0 refills | Status: DC
Start: 2020-10-27 — End: 2020-12-22

## 2020-11-03 ENCOUNTER — Encounter: Payer: BC Managed Care – PPO | Admitting: Nurse Practitioner

## 2020-12-05 ENCOUNTER — Encounter (INDEPENDENT_AMBULATORY_CARE_PROVIDER_SITE_OTHER): Payer: Self-pay

## 2020-12-05 ENCOUNTER — Other Ambulatory Visit: Payer: Self-pay | Admitting: Nurse Practitioner

## 2020-12-05 DIAGNOSIS — R4586 Emotional lability: Secondary | ICD-10-CM

## 2020-12-05 DIAGNOSIS — M797 Fibromyalgia: Secondary | ICD-10-CM

## 2020-12-22 ENCOUNTER — Other Ambulatory Visit: Payer: Self-pay | Admitting: Nurse Practitioner

## 2020-12-22 DIAGNOSIS — R632 Polyphagia: Secondary | ICD-10-CM

## 2020-12-22 MED ORDER — LISDEXAMFETAMINE DIMESYLATE 20 MG PO CAPS: 20.0000 mg | ORAL_CAPSULE | Freq: Every day | ORAL | 0 refills | Status: DC

## 2021-01-02 ENCOUNTER — Encounter: Payer: Self-pay | Admitting: Nurse Practitioner

## 2021-01-02 ENCOUNTER — Other Ambulatory Visit: Payer: Self-pay

## 2021-01-02 ENCOUNTER — Ambulatory Visit (INDEPENDENT_AMBULATORY_CARE_PROVIDER_SITE_OTHER): Payer: BC Managed Care – PPO | Admitting: Nurse Practitioner

## 2021-01-02 VITALS — BP 122/80 | HR 98 | Temp 98.6°F | Ht 65.2 in | Wt 214.8 lb

## 2021-01-02 DIAGNOSIS — Z1231 Encounter for screening mammogram for malignant neoplasm of breast: Secondary | ICD-10-CM

## 2021-01-02 DIAGNOSIS — R0982 Postnasal drip: Secondary | ICD-10-CM

## 2021-01-02 DIAGNOSIS — M797 Fibromyalgia: Secondary | ICD-10-CM

## 2021-01-02 DIAGNOSIS — N644 Mastodynia: Secondary | ICD-10-CM | POA: Diagnosis not present

## 2021-01-02 DIAGNOSIS — N898 Other specified noninflammatory disorders of vagina: Secondary | ICD-10-CM

## 2021-01-02 DIAGNOSIS — Z6835 Body mass index (BMI) 35.0-35.9, adult: Secondary | ICD-10-CM

## 2021-01-02 DIAGNOSIS — R413 Other amnesia: Secondary | ICD-10-CM

## 2021-01-02 DIAGNOSIS — E669 Obesity, unspecified: Secondary | ICD-10-CM

## 2021-01-02 MED ORDER — LEVOCETIRIZINE DIHYDROCHLORIDE 5 MG PO TABS
5.0000 mg | ORAL_TABLET | Freq: Every evening | ORAL | 1 refills | Status: DC
Start: 2021-01-02 — End: 2021-08-07

## 2021-01-02 MED ORDER — AZELASTINE HCL 0.1 % NA SOLN
2.0000 | Freq: Two times a day (BID) | NASAL | 5 refills | Status: DC
Start: 2021-01-02 — End: 2021-04-18

## 2021-01-02 NOTE — Patient Instructions (Addendum)

## 2021-01-02 NOTE — Progress Notes (Signed)
I,Yamilka Roman [employer] as a Education administrator for [employer], FNP.,have documented all relevant documentation on the behalf of [name], FNP,as directed by  [name], FNP while in the presence of [name], Glenn. This visit occurred during the SARS-CoV-2 public health emergency.  Safety protocols were in place, including screening questions prior to the visit, additional usage of staff PPE, and extensive cleaning of exam room while observing appropriate contact time as indicated for disinfecting solutions.  Subjective:     Patient ID: Lisa Casey , female    DOB: [birth year] , 60 y.o.   MRN: [id number]   Chief Complaint  Patient presents with  . Allergies    Patient stated have been bothering her for the past month. She stated she is constantly clearing her throat, wheezing, crust in her eyes and some postnasal drip. She has tried Human resources officer, zyrtec and she tried a natural remedy as well and nothing worked.   . breast concerns    She feels a burning sensation on the left side of her breast   . Referral    Patient would like a referral to an obgyn.     HPI  Patient presents today for an eval on her allergies. She stated her symptoms started about a month or two ago.   She stated she is constantly clearing her throat, wheezing, crust in her eyes and some postnasal drip. She has tried Human resources officer, zyrtec and she tried a natural remedy as well and nothing worked. She is also taking flonase.  She also had some concerns about her left breast, she reports having a burning sensation on the side.  Wt Readings from Last 3 Encounters: 01/02/21 : 214 lb 12.8 oz (97.4 kg) 10/07/20 : 210 lb (95.3 kg) 08/09/20 : 204 lb 6.4 oz (92.7 kg)  She will put the neosporin or AD ointment to the area of her vagina - this has been occurring since 2013.      Past Medical History:  Diagnosis Date  . Anxiety    HX OF ANXIETY ATTACKS  . Back pain, chronic   . Fibromyalgia    PT ON LYRIC   . GERD  (gastroesophageal reflux disease)    NOT ON ANY MEDS AT PRESENT  . Headache(784.0)    HX OF MIGRAINES  . Hypertension    HX OF ELEVATED B/P FOR SEVERAL YRS--NO MEDS UNTIL LAST WEEK--PT WAS C/O OF CHEST PRESSURE AND B/P ELEVATED--PT SAW NURSE PRACTIONER ON 08/21/11 AND STATES EKG WAS DONE--TOLD ABNORMAL--PUT ON B/P MEDICATION LISINOPRIL AND TOLD SHE WAS TO SEE A CARDIOLOGIST.  [name] Shortness of breath    WITH EXERTION--NEW FOR PT--PT ALSO REPORTS FEELING TIRED     History reviewed. No pertinent family history.   Current Outpatient Medications:  .  albuterol (VENTOLIN HFA) 108 (90 Base) MCG/ACT inhaler, Inhale 2 puffs into the lungs every 6 (six) hours as needed for wheezing or shortness of breath., Disp: 8 g, Rfl: 2 .  amLODipine (NORVASC) 2.5 MG tablet, Take 1 tablet by mouth once daily, Disp: 90 tablet, Rfl: 0 .  azelastine (ASTELIN) 0.1 % nasal spray, Place 2 sprays into both nostrils 2 (two) times daily. Use in each nostril as directed, Disp: 30 mL, Rfl: 5 .  cyclobenzaprine (FLEXERIL) 5 MG tablet, Take 1 tablet by mouth three times daily as needed for muscle spasm, Disp: 40 tablet, Rfl: 0 .  fluticasone (FLONASE) 50 MCG/ACT nasal spray, Place 2 sprays into both nostrils daily., Disp: 16 g, Rfl: 0 .  levocetirizine (XYZAL) 5 MG tablet, Take 1 tablet (5 mg total) by mouth every evening., Disp: 90 tablet, Rfl: 1 .  lisdexamfetamine (VYVANSE) 20 MG capsule, Take 1 capsule (20 mg total) by mouth daily., Disp: 30 capsule, Rfl: 0 .  PARoxetine (PAXIL) 20 MG tablet, Take 1 tablet by mouth once daily, Disp: 90 tablet, Rfl: 0 .  pregabalin (LYRICA) 150 MG capsule, Take 1 capsule (150 mg total) by mouth 2 (two) times daily., Disp: 60 capsule, Rfl: 3 .  amoxicillin-clavulanate (AUGMENTIN) 875-125 MG tablet, Take 1 tablet by mouth 2 (two) times daily for 7 days., Disp: 14 tablet, Rfl: 0 .  benzonatate (TESSALON PERLES) 100 MG capsule, Take 1 capsule (100 mg total) by mouth 3 (three) times daily as needed  for cough., Disp: 30 capsule, Rfl: 1 .  hydrOXYzine (VISTARIL) 25 MG capsule, TAKE 1 CAPSULE BY MOUTH EVERY 6 HOURS AS NEEDED, Disp: 30 capsule, Rfl: 0   Allergies  Allergen Reactions  . Codeine Itching    With rash     Review of Systems  Constitutional: Negative.   Respiratory: Negative.  Negative for cough.   Cardiovascular: Negative.  Negative for chest pain, palpitations and leg swelling.  Neurological: Negative for dizziness and headaches.  Psychiatric/Behavioral: Negative.      Today's Vitals   01/02/21 1054  BP: 122/80  Pulse: 98  Temp: 98.6 F (37 C)  TempSrc: Oral  Weight: 214 lb 12.8 oz (97.4 kg)  Height: 5' 5.2" (1.656 m)  PainSc: 5    Body mass index is 35.53 kg/m.   Objective:  Physical Exam Vitals reviewed.  Constitutional:      General: She is not in acute distress.    Appearance: Normal appearance.  Cardiovascular:     Rate and Rhythm: Normal rate and regular rhythm.     Pulses: Normal pulses.     Heart sounds: Normal heart sounds. No murmur heard.   Pulmonary:     Effort: Pulmonary effort is normal. No respiratory distress.     Breath sounds: Normal breath sounds.  Chest:     Chest wall: No mass.  Breasts:     Right: Normal. No mass.     Left: Tenderness (at 2 o'clock) present. No mass.        Comments: Left breast pain at 2 o'clock.   Skin:    Capillary Refill: Capillary refill takes less than 2 seconds.  Neurological:     General: No focal deficit present.     Mental Status: She is alert and oriented to person, place, and time.     Cranial Nerves: No cranial nerve deficit.     Motor: No weakness.  Psychiatric:        Mood and Affect: Mood normal.        Behavior: Behavior normal.        Thought Content: Thought content normal.        Judgment: Judgment normal.         Assessment And Plan:     1. Fibromyalgia  Will obtain autoimmune panel to check for inflammation  She is to continue with lyrica - Autoimmune Profile  2.  Post-nasal drainage  Likely related to seasonal allergies  Will try a different regimen - azelastine (ASTELIN) 0.1 % nasal spray; Place 2 sprays into both nostrils 2 (two) times daily. Use in each nostril as directed  Dispense: 30 mL; Refill: 5 - levocetirizine (XYZAL) 5 MG tablet; Take 1 tablet (5 mg total) by mouth  every evening.  Dispense: 90 tablet; Refill: 1  3. Class 2 obesity with body mass index (BMI) of 35.0 to 35.9 in adult, unspecified obesity type, unspecified whether serious comorbidity present  Chronic  Discussed healthy diet and regular exercise options   Encouraged to exercise at least 150 minutes per week with 2 days of strength training  4. Vaginal irritation  She is requesting a referral to GYN for further evaluation of persistent vaginal irritation  Denies discharge - Ambulatory referral to Gynecology  5. Breast pain, left  Tenderness noted to left breast at 2 o'clock  Will send for diagnostic mammogram - [facility] DIAG BREAST TOMO BILATERAL; Future  6. Memory change  Reporting periods of forgetfulness  Will check vitamin b12 and d for deficiency  There may be a component of depression vs stress vs fatigued to cause these symptoms - Vitamin B12 - VITAMIN D 25 Hydroxy (Vit-D Deficiency, Fractures)  7. Encounter for screening mammogram for malignant neoplasm of breast  Pt instructed on Self Breast Exam.According to ACOG guidelines Women aged 91 and older are recommended to get an annual mammogram. Form completed and given to patient contact the [facility] for appointment scheduing.   Pt encouraged to get annual mammogram - [facility] Digital Screening; Future     Patient was given opportunity to ask questions. Patient verbalized understanding of the plan and was able to repeat key elements of the plan. All questions were answered to their satisfaction.  [name], FNP   I, [name], FNP, have reviewed all documentation for this visit. The  documentation on 01/02/21 for the exam, diagnosis, procedures, and orders are all accurate and complete.   IF YOU HAVE BEEN REFERRED TO A SPECIALIST, IT MAY TAKE 1-2 WEEKS TO SCHEDULE/PROCESS THE REFERRAL. IF YOU HAVE NOT HEARD FROM US/SPECIALIST IN TWO WEEKS, PLEASE GIVE [place] A CALL AT [phone].   THE PATIENT IS ENCOURAGED TO PRACTICE SOCIAL DISTANCING DUE TO THE COVID-19 PANDEMIC.

## 2021-01-03 LAB — AUTOIMMUNE PROFILE
Anti Nuclear Antibody (ANA): NEGATIVE
Complement C3, Serum: 161 mg/dL (ref 82–167)
dsDNA Ab: <1 IU/mL (ref 0–9)

## 2021-01-03 LAB — VITAMIN B12: Vitamin B-12: 768 pg/mL (ref 232–1245)

## 2021-01-03 LAB — VITAMIN D 25 HYDROXY (VIT D DEFICIENCY, FRACTURES): Vit D, 25-Hydroxy: 30.3 ng/mL (ref 30.0–100.0)

## 2021-01-04 ENCOUNTER — Other Ambulatory Visit: Payer: Self-pay | Admitting: Nurse Practitioner

## 2021-01-04 DIAGNOSIS — F419 Anxiety disorder, unspecified: Secondary | ICD-10-CM

## 2021-01-05 ENCOUNTER — Telehealth (INDEPENDENT_AMBULATORY_CARE_PROVIDER_SITE_OTHER): Payer: BC Managed Care – PPO | Admitting: Nurse Practitioner

## 2021-01-05 ENCOUNTER — Encounter: Payer: Self-pay | Admitting: Nurse Practitioner

## 2021-01-05 VITALS — BP 141/87 | HR 104 | Temp 98.3°F

## 2021-01-05 DIAGNOSIS — R059 Cough, unspecified: Secondary | ICD-10-CM

## 2021-01-05 DIAGNOSIS — R0981 Nasal congestion: Secondary | ICD-10-CM | POA: Diagnosis not present

## 2021-01-05 MED ORDER — AMOXICILLIN-POT CLAVULANATE 875-125 MG PO TABS
1.0000 | ORAL_TABLET | Freq: Two times a day (BID) | ORAL | 0 refills | Status: AC
Start: 2021-01-05 — End: 2021-01-12

## 2021-01-05 MED ORDER — BENZONATATE 100 MG PO CAPS: 100.0000 mg | ORAL_CAPSULE | Freq: Three times a day (TID) | ORAL | 1 refills | Status: DC | PRN

## 2021-01-05 NOTE — Patient Instructions (Signed)
Sinus Headache  A sinus headache occurs when your sinuses become clogged or swollen. Sinuses are air-filled spaces in your skull that are behind the bones of your face and forehead. Sinus headaches can range from mild to severe. What are the causes? A sinus headache can result from various conditions that affect the sinuses. Common causes include:  Colds.  Sinus infections.  Allergies. Many people confuse sinus headaches with migraines or tension headaches because both headaches can cause facial pain and nasal symptoms. What are the signs or symptoms? The main symptom of this condition is a headache that may feel like pain or pressure in your face, forehead, ears, or upper teeth. People who have a sinus headache often have other symptoms, such as:  Congested or runny nose.  Fever.  Inability to smell. Weather changes can make symptoms worse. How is this diagnosed? This condition may be diagnosed based on:  A physical exam and medical history.  Imaging tests, such as a CT scan or MRI, to check for problems with the sinuses.  Examination of the sinuses using a thin tool with a camera that is inserted through your nose (endoscopy). How is this treated? Treatment for this condition depends on the cause.  Sinus pain that is caused by a sinus infection may be treated with antibiotic medicine.  Sinus pain that is caused by congestion may be helped by rinsing out (flushing) the nose and sinuses with saline solution.  Sinus pain that is caused by allergies may be helped by allergy medicines (antihistamines) and medicated nasal sprays.  Sinus surgery may be needed in some cases if other treatments do not help. Follow these instructions at home: General instructions  If directed: ? Apply a warm, moist washcloth to your face to help relieve pain. ? Use a nasal saline wash. Hydrate and humidify  Drink enough water to keep your urine clear or pale yellow. Staying hydrated will help  to thin your mucus.  Use a cool mist humidifier to keep the humidity level in your home above 50%.  Inhale steam for 10-15 minutes, 3-4 times a day or as told by your health care provider. You can do this in the bathroom while a hot shower is running.  Limit your exposure to cool or dry air. Medicines  Take over-the-counter and prescription medicines only as told by your health care provider.  If you were prescribed an antibiotic medicine, take it as told by your health care provider. Do not stop taking the antibiotic even if you start to feel better.  If you have congestion, use a nasal spray to help lessen pressure.   Contact a health care provider if:  You have a headache more than one time a week.  You have sensitivity to light or sound.  You develop a fever.  You feel nauseous or you vomit.  Your headaches do not get better with treatment. Many people think that they have a sinus headache when they actually have a migraine or a tension headache. Get help right away if:  You have vision problems.  You have sudden, severe pain in your face or head.  You have a seizure.  You are confused.  You have a stiff neck. Summary  A sinus headache occurs when your sinuses become clogged or swollen.  A sinus headache can result from various conditions that affect the sinuses, such as a cold, a sinus infection, or an allergy.  Treatment for this condition depends on the cause. It may include  medicine, such as antibiotics or antihistamines. This information is not intended to replace advice given to you by your health care provider. Make sure you discuss any questions you have with your health care provider. Document Revised: 06/14/2020 Document Reviewed: 06/14/2020 Elsevier Patient Education  2021 [employer].

## 2021-01-05 NOTE — Progress Notes (Signed)
Virtual Visit via phone call due to failed video    This visit type was conducted due to national recommendations for restrictions regarding the COVID-19 Pandemic (e.g. social distancing) in an effort to limit this patient's exposure and mitigate transmission in our community.  Due to her co-morbid illnesses, this patient is at least at moderate risk for complications without adequate follow up.  This format is felt to be most appropriate for this patient at this time.  All issues noted in this document were discussed and addressed.  A limited physical exam was performed with this format.    This visit type was conducted due to national recommendations for restrictions regarding the COVID-19 Pandemic (e.g. social distancing) in an effort to limit this patient's exposure and mitigate transmission in our community.  Patients identity confirmed using two different identifiers.  This format is felt to be most appropriate for this patient at this time.  All issues noted in this document were discussed and addressed.  No physical exam was performed (except for noted visual exam findings with Video Visits).    Date:  01/05/2021   ID:  Lisa Casey, DOB 05/24/1961, MRN [id number]  Patient Location:  Home   Provider location:   Office    Chief Complaint:  Congestion, cough   History of Present Illness:    Lisa Casey is a 60 y.o. female who presents via video conferencing for a telehealth visit today.    The patient have symptoms of cough and congestion.   Phone call due to failed video call. Her symptoms sore throat, runny nose, dark yellow congestion, runny eyes, itchy ears. No fever, feels tired. She is vaccinated. And has been vaccinated. She lives with family and nonne has any symptoms. She is coughing.  Her sinuses are not tender.  She is allergic to codeine. She took at home Covid test and it was negative. She doesn't want to come in the office for a COVID test as she believes she  doesn't have Covid.     Past Medical History:  Diagnosis Date  . Anxiety    HX OF ANXIETY ATTACKS  . Back pain, chronic   . Fibromyalgia    PT ON LYRIC   . GERD (gastroesophageal reflux disease)    NOT ON ANY MEDS AT PRESENT  . Headache(784.0)    HX OF MIGRAINES  . Hypertension    HX OF ELEVATED B/P FOR SEVERAL YRS--NO MEDS UNTIL LAST WEEK--PT WAS C/O OF CHEST PRESSURE AND B/P ELEVATED--PT SAW NURSE PRACTIONER ON 08/21/11 AND STATES EKG WAS DONE--TOLD ABNORMAL--PUT ON B/P MEDICATION LISINOPRIL AND TOLD SHE WAS TO SEE A CARDIOLOGIST.  [name] Shortness of breath    WITH EXERTION--NEW FOR PT--PT ALSO REPORTS FEELING TIRED   Past Surgical History:  Procedure Laterality Date  . CONSTIPATION    . CYSTOSCOPY W/ URETERAL STENT PLACEMENT  09/23/2012   Procedure: CYSTOSCOPY WITH RETROGRADE PYELOGRAM/URETERAL STENT PLACEMENT;  Surgeon: Alexis Frock, MD;  Location: [facility];  Service: Urology;  Laterality: Right;  . CYSTOSCOPY WITH RETROGRADE PYELOGRAM, URETEROSCOPY AND STENT PLACEMENT  10/08/2012   Procedure: CYSTOSCOPY WITH RETROGRADE PYELOGRAM, URETEROSCOPY AND STENT PLACEMENT;  Surgeon: Alexis Frock, MD;  Location: [facility];  Service: Urology;  Laterality: Right;  90 MIN NO RETROGRADE NEEDS FLEX URETEROSCOPE   . HOLMIUM LASER APPLICATION  0/25/4270   Procedure: HOLMIUM LASER APPLICATION;  Surgeon: Alexis Frock, MD;  Location: [facility];  Service: Urology;  Laterality: Right;  . LEFT  HEART CATHETERIZATION WITH CORONARY ANGIOGRAM N/A 10/29/2011   Procedure: LEFT HEART CATHETERIZATION WITH CORONARY ANGIOGRAM;  Surgeon: Leonie Man, MD;  Location: [facility] CATH LAB;  Service: Cardiovascular;  Laterality: N/A;  . ROBOTIC ASSISTED LAP VAGINAL HYSTERECTOMY  09/14/2011   Procedure: ROBOTIC ASSISTED LAPAROSCOPIC VAGINAL HYSTERECTOMY;  Surgeon: Agnes Lawrence, MD;  Location: [facility];  Service: Gynecology;  Laterality: N/A;  . TUBAL LIGATION       Current Meds   Medication Sig  . amoxicillin-clavulanate (AUGMENTIN) 875-125 MG tablet Take 1 tablet by mouth 2 (two) times daily for 7 days.  . benzonatate (TESSALON PERLES) 100 MG capsule Take 1 capsule (100 mg total) by mouth 3 (three) times daily as needed for cough.     Allergies:   Codeine   Social History   Tobacco Use  . Smoking status: Never Smoker  . Smokeless tobacco: Never Used  Substance Use Topics  . Alcohol use: No  . Drug use: No     Family Hx: The patient's family history is not on file.  ROS:   Please see the history of present illness.    Review of Systems  Constitutional: Negative for chills and fever.  HENT: Positive for congestion. Negative for sinus pain.   Respiratory: Positive for cough. Negative for shortness of breath and wheezing.   Cardiovascular: Negative for chest pain and palpitations.  Gastrointestinal: Negative for constipation, diarrhea, nausea and vomiting.  Musculoskeletal: Negative for myalgias.    All other systems reviewed and are negative.   Labs/Other Tests and Data Reviewed:    Recent Labs: 08/09/2020: ALT 16; BUN 18; Creatinine, Ser 1.14; Potassium 4.3; Sodium 145   Recent Lipid Panel Lab Results  Component Value Date/Time   CHOL 148 11/03/2019 05:08 PM   TRIG 113 11/03/2019 05:08 PM   HDL 62 11/03/2019 05:08 PM   CHOLHDL 2.4 11/03/2019 05:08 PM   LDLCALC 66 11/03/2019 05:08 PM    Wt Readings from Last 3 Encounters:  01/02/21 214 lb 12.8 oz (97.4 kg)  10/07/20 210 lb (95.3 kg)  08/09/20 204 lb 6.4 oz (92.7 kg)     Exam:    Vital Signs:  BP (!) 141/87   Pulse (!) 104   Temp 98.3 F (36.8 C) (Oral)   LMP 08/18/2011     Physical Exam Vitals and nursing note reviewed.  Pulmonary:     Effort: Pulmonary effort is normal.  Neurological:     Mental Status: She is alert and oriented to person, place, and time.  Psychiatric:        Mood and Affect: Affect normal.     ASSESSMENT & PLAN:     There are no diagnoses linked  to this encounter.   1) Sinus congestion -Patient had some symptoms of cough and congestion. Will go ahead and treat her with antx due to age and comorbidities.  -Augmentin BID x 7 days sent to pharmacy  -Advised patient to get test for COVID; patient declined.  -Advised patient to take vitamin C,D, zinc  -Advised patient if her symptoms do not get better or if she experiences any shortness of breath, chest pain or leg pain to seek emergency care. -Advised patient to take Flonase.   2) Cough  -Sent prescription tessalon pearls 100 mg TID  -Advised patient to take OTC delsym   Follow up: as needed   COVID-19 Education: The signs and symptoms of COVID-19 were discussed with the patient and how to seek care for testing (follow up  with PCP or arrange E-visit).  The importance of social distancing was discussed today.  Patient Risk:   After full review of this patients clinical status, I feel that they are at least moderate risk at this time.  Time:   Today, I have spent  15 minutes/ seconds with the patient with telehealth technology discussing above diagnoses.     Medication Adjustments/Labs and Tests Ordered: Current medicines are reviewed at length with the patient today.  Concerns regarding medicines are outlined above.   Tests Ordered: No orders of the defined types were placed in this encounter.   Medication Changes: Meds ordered this encounter  Medications  . amoxicillin-clavulanate (AUGMENTIN) 875-125 MG tablet    Sig: Take 1 tablet by mouth 2 (two) times daily for 7 days.    Dispense:  14 tablet    Refill:  0  . benzonatate (TESSALON PERLES) 100 MG capsule    Sig: Take 1 capsule (100 mg total) by mouth 3 (three) times daily as needed for cough.    Dispense:  30 capsule    Refill:  1    Disposition:  Follow up as needed or if symptoms get worse.   Signed, Bary Castilla, NP

## 2021-01-06 ENCOUNTER — Other Ambulatory Visit: Payer: Self-pay | Admitting: Nurse Practitioner

## 2021-01-06 DIAGNOSIS — N644 Mastodynia: Secondary | ICD-10-CM

## 2021-01-25 ENCOUNTER — Other Ambulatory Visit: Payer: Self-pay | Admitting: Nurse Practitioner

## 2021-01-25 ENCOUNTER — Encounter: Payer: Self-pay | Admitting: Nurse Practitioner

## 2021-01-25 DIAGNOSIS — F419 Anxiety disorder, unspecified: Secondary | ICD-10-CM

## 2021-01-25 DIAGNOSIS — M797 Fibromyalgia: Secondary | ICD-10-CM

## 2021-01-26 ENCOUNTER — Other Ambulatory Visit: Payer: Self-pay | Admitting: Nurse Practitioner

## 2021-01-26 DIAGNOSIS — R632 Polyphagia: Secondary | ICD-10-CM

## 2021-01-26 MED ORDER — LISDEXAMFETAMINE DIMESYLATE 20 MG PO CAPS
20.0000 mg | ORAL_CAPSULE | Freq: Every day | ORAL | 0 refills | Status: DC
Start: 2021-01-26 — End: 2021-03-09

## 2021-02-14 ENCOUNTER — Ambulatory Visit
Admission: RE | Admit: 2021-02-14 | Discharge: 2021-02-14 | Disposition: A | Discharge status: Home / Self Care | Payer: BC Managed Care – PPO | Source: Ambulatory Visit | Attending: Nurse Practitioner | Admitting: Nurse Practitioner

## 2021-02-14 ENCOUNTER — Other Ambulatory Visit: Payer: Self-pay

## 2021-02-14 DIAGNOSIS — N644 Mastodynia: Secondary | ICD-10-CM

## 2021-03-06 ENCOUNTER — Ambulatory Visit: Payer: BC Managed Care – PPO | Admitting: Nurse Practitioner

## 2021-03-09 ENCOUNTER — Encounter: Payer: Self-pay | Admitting: Nurse Practitioner

## 2021-03-09 ENCOUNTER — Ambulatory Visit: Payer: BC Managed Care – PPO | Admitting: Nurse Practitioner

## 2021-03-09 ENCOUNTER — Other Ambulatory Visit: Payer: Self-pay

## 2021-03-09 VITALS — BP 122/80 | HR 95 | Temp 98.4°F | Ht 65.6 in | Wt 212.4 lb

## 2021-03-09 DIAGNOSIS — R7303 Prediabetes: Secondary | ICD-10-CM

## 2021-03-09 DIAGNOSIS — Z23 Encounter for immunization: Secondary | ICD-10-CM | POA: Diagnosis not present

## 2021-03-09 DIAGNOSIS — Z6834 Body mass index (BMI) 34.0-34.9, adult: Secondary | ICD-10-CM

## 2021-03-09 DIAGNOSIS — E6609 Other obesity due to excess calories: Secondary | ICD-10-CM | POA: Diagnosis not present

## 2021-03-09 DIAGNOSIS — R632 Polyphagia: Secondary | ICD-10-CM

## 2021-03-09 MED ORDER — LISDEXAMFETAMINE DIMESYLATE 20 MG PO CAPS: 20.0000 mg | ORAL_CAPSULE | Freq: Every day | ORAL | 0 refills | Status: DC

## 2021-03-09 MED ORDER — METFORMIN HCL 500 MG PO TABS: 500.0000 mg | ORAL_TABLET | Freq: Every day | ORAL | 2 refills | Status: DC

## 2021-03-09 MED ORDER — SHINGRIX 50 MCG/0.5ML IM SUSR: 0.5000 mL | Freq: Once | INTRAMUSCULAR | 0 refills | Status: AC

## 2021-03-09 NOTE — Progress Notes (Signed)
I,Yamilka Roman [employer] as a Education administrator for [employer], FNP.,have documented all relevant documentation on the behalf of [name], FNP,as directed by  [name], FNP while in the presence of [name], [place].  This visit occurred during the SARS-CoV-2 public health emergency.  Safety protocols were in place, including screening questions prior to the visit, additional usage of staff PPE, and extensive cleaning of exam room while observing appropriate contact time as indicated for disinfecting solutions.  Subjective:     Patient ID: Lisa Casey , female    DOB: August 30, 1961 , 60 y.o.   MRN: [id number]   Chief Complaint  Patient presents with   Weight Check   Prediabetes    Patient stated she has given some thought to starting a medication to help with her levels. She agrees to starting med    HPI  Patient presents today for a weight check and prediabetes f/u.  She is taking vyvanse for binge eating. She is not exercising, continues to eat heavily.   Wt Readings from Last 3 Encounters: 03/09/21 : 212 lb 6.4 oz (96.3 kg) 01/02/21 : 214 lb 12.8 oz (97.4 kg) 10/07/20 : 210 lb (95.3 kg)      Past Medical History:  Diagnosis Date   Anxiety    HX OF ANXIETY ATTACKS   Back pain, chronic    Fibromyalgia    PT ON LYRIC    GERD (gastroesophageal reflux disease)    NOT ON ANY MEDS AT PRESENT   Headache(784.0)    HX OF MIGRAINES   Hypertension    HX OF ELEVATED B/P FOR SEVERAL YRS--NO MEDS UNTIL LAST WEEK--PT WAS C/O OF CHEST PRESSURE AND B/P ELEVATED--PT SAW NURSE PRACTIONER ON 08/21/11 AND STATES EKG WAS DONE--TOLD ABNORMAL--PUT ON B/P MEDICATION LISINOPRIL AND TOLD SHE WAS TO SEE A CARDIOLOGIST.   Shortness of breath    WITH EXERTION--NEW FOR PT--PT ALSO REPORTS FEELING TIRED     Family History  Problem Relation Age of Onset   Breast cancer Mother 38   Breast cancer Paternal Grandmother      Current Outpatient Medications:    albuterol (VENTOLIN HFA) 108 (90  Base) MCG/ACT inhaler, Inhale 2 puffs into the lungs every 6 (six) hours as needed for wheezing or shortness of breath., Disp: 8 g, Rfl: 2   azelastine (ASTELIN) 0.1 % nasal spray, Place 2 sprays into both nostrils 2 (two) times daily. Use in each nostril as directed, Disp: 30 mL, Rfl: 5   cephALEXin (KEFLEX) 500 MG capsule, Take 500 mg by mouth 2 (two) times daily., Disp: , Rfl:    doxycycline (VIBRA-TABS) 100 MG tablet, Take 100 mg by mouth 2 (two) times daily., Disp: , Rfl:    fluconazole (DIFLUCAN) 150 MG tablet, Take 150 mg by mouth once., Disp: , Rfl:    fluticasone (FLONASE) 50 MCG/ACT nasal spray, Place 2 sprays into both nostrils daily., Disp: 16 g, Rfl: 0   levocetirizine (XYZAL) 5 MG tablet, Take 1 tablet (5 mg total) by mouth every evening., Disp: 90 tablet, Rfl: 1   metFORMIN (GLUCOPHAGE) 500 MG tablet, Take 1 tablet (500 mg total) by mouth daily., Disp: 30 tablet, Rfl: 2   methylPREDNISolone (MEDROL DOSEPAK) 4 MG TBPK tablet, Take by mouth., Disp: , Rfl:    neomycin-polymyxin b-dexamethasone (MAXITROL) 3.5-10000-0.1 OINT, , Disp: , Rfl:    PARoxetine (PAXIL) 20 MG tablet, Take 1 tablet by mouth once daily, Disp: 90 tablet, Rfl: 0   pregabalin (LYRICA) 150 MG capsule, Take  1 capsule (150 mg total) by mouth 2 (two) times daily., Disp: 60 capsule, Rfl: 3   amLODipine (NORVASC) 2.5 MG tablet, Take 1 tablet by mouth once daily, Disp: 90 tablet, Rfl: 0   benzonatate (TESSALON PERLES) 100 MG capsule, Take 1 capsule (100 mg total) by mouth 3 (three) times daily as needed for cough. (Patient not taking: Reported on 03/09/2021), Disp: 30 capsule, Rfl: 1   cyclobenzaprine (FLEXERIL) 5 MG tablet, Take 1 tablet by mouth three times daily as needed for muscle spasm, Disp: 40 tablet, Rfl: 0   hydrOXYzine (VISTARIL) 25 MG capsule, TAKE 1 CAPSULE BY MOUTH EVERY 6 HOURS AS NEEDED, Disp: 30 capsule, Rfl: 0   lisdexamfetamine (VYVANSE) 20 MG capsule, Take 1 capsule (20 mg total) by mouth daily., Disp: 30  capsule, Rfl: 0   Allergies  Allergen Reactions   Codeine Itching    With rash     Review of Systems  Constitutional: Negative.   Eyes: Negative.   Respiratory: Negative.  Negative for cough.   Cardiovascular: Negative.  Negative for chest pain, palpitations and leg swelling.  Skin: Negative.   Neurological:  Negative for dizziness and headaches.  Psychiatric/Behavioral: Negative.      Today's Vitals   03/09/21 1636  BP: 122/80  Pulse: 95  Temp: 98.4 F (36.9 C)  Weight: 212 lb 6.4 oz (96.3 kg)  Height: 5' 5.6" (1.666 m)   Body mass index is 34.7 kg/m.   Objective:  Physical Exam Vitals reviewed.  Constitutional:      General: She is not in acute distress.    Appearance: Normal appearance. She is obese.  Cardiovascular:     Rate and Rhythm: Normal rate and regular rhythm.     Pulses: Normal pulses.     Heart sounds: Normal heart sounds. No murmur heard. Pulmonary:     Effort: Pulmonary effort is normal. No respiratory distress.     Breath sounds: Normal breath sounds.  Skin:    Capillary Refill: Capillary refill takes less than 2 seconds.  Neurological:     General: No focal deficit present.     Mental Status: She is alert and oriented to person, place, and time.     Cranial Nerves: No cranial nerve deficit.     Motor: No weakness.  Psychiatric:        Mood and Affect: Mood normal.        Behavior: Behavior normal.        Thought Content: Thought content normal.        Judgment: Judgment normal.        Assessment And Plan:     1. Prediabetes Chronic, she is willing to start metformin Discussed side effects of to include GI upset  - Hemoglobin A1c - CMP14+EGFR - metFORMIN (GLUCOPHAGE) 500 MG tablet; Take 1 tablet (500 mg total) by mouth daily.  Dispense: 30 tablet; Refill: 2  2. Class 1 obesity due to excess calories without serious comorbidity with body mass index (BMI) of 34.0 to 34.9 in adult Chronic Discussed healthy diet and regular exercise  options  Encouraged to exercise at least 150 minutes per week with 2 days of strength training  3. Binge eating Continue with vyvanse She has also been referred to a counselor this may help the most as some of her eating is emotional - lisdexamfetamine (VYVANSE) 20 MG capsule; Take 1 capsule (20 mg total) by mouth daily.  Dispense: 30 capsule; Refill: 0  4. Encounter for immunization - Zoster  Vaccine Adjuvanted [facility]) injection; Inject 0.5 mLs into the muscle once for 1 dose.  Dispense: 0.5 mL; Refill: 0     Patient was given opportunity to ask questions. Patient verbalized understanding of the plan and was able to repeat key elements of the plan. All questions were answered to their satisfaction.  [name], FNP   I, [name], FNP, have reviewed all documentation for this visit. The documentation on 03/09/21 for the exam, diagnosis, procedures, and orders are all accurate and complete.   IF YOU HAVE BEEN REFERRED TO A SPECIALIST, IT MAY TAKE 1-2 WEEKS TO SCHEDULE/PROCESS THE REFERRAL. IF YOU HAVE NOT HEARD FROM US/SPECIALIST IN TWO WEEKS, PLEASE GIVE [place] A CALL AT [phone].   THE PATIENT IS ENCOURAGED TO PRACTICE SOCIAL DISTANCING DUE TO THE COVID-19 PANDEMIC.

## 2021-03-09 NOTE — Patient Instructions (Signed)
Exercising to Lose Weight Exercise is structured, repetitive physical activity to improve fitness and health. Getting regular exercise is important for everyone. It is especially important if you are overweight. Being overweight increases your risk of heart disease, stroke, diabetes, high blood pressure, and several types of cancer.Reducing your calorie intake and exercising can help you lose weight. Exercise is usually categorized as moderate or vigorous intensity. To lose weight, most people need to do a certain amount of moderate-intensity orvigorous-intensity exercise each week. Moderate-intensity exercise  Moderate-intensity exercise is any activity that gets you moving enough to burn at least three times more energy (calories) than if you were sitting. Examples of moderate exercise include: Walking a mile in 15 minutes. Doing light yard work. Biking at an easy pace. Most people should get at least 150 minutes (2 hours and 30 minutes) a week ofmoderate-intensity exercise to maintain their body weight. Vigorous-intensity exercise Vigorous-intensity exercise is any activity that gets you moving enough to burn at least six times more calories than if you were sitting. When you exercise at this intensity, you should be working hard enough that you are not able tocarry on a conversation. Examples of vigorous exercise include: Running. Playing a team sport, such as football, basketball, and soccer. Jumping rope. Most people should get at least 75 minutes (1 hour and 15 minutes) a week ofvigorous-intensity exercise to maintain their body weight. How can exercise affect me? When you exercise enough to burn more calories than you eat, you lose weight. Exercise also reduces body fat and builds muscle. The more muscle you have, the more calories you burn. Exercise also: Improves mood. Reduces stress and tension. Improves your overall fitness, flexibility, and endurance. Increases bone strength. The  amount of exercise you need to lose weight depends on: Your age. The type of exercise. Any health conditions you have. Your overall physical ability. Talk to your health care provider about how much exercise you need and whattypes of activities are safe for you. What actions can I take to lose weight? Nutrition  Make changes to your diet as told by your health care provider or diet and nutrition specialist (dietitian). This may include: Eating fewer calories. Eating more protein. Eating less unhealthy fats. Eating a diet that includes fresh fruits and vegetables, whole grains, low-fat dairy products, and lean protein. Avoiding foods with added fat, salt, and sugar. Drink plenty of water while you exercise to prevent dehydration or heat stroke.  Activity Choose an activity that you enjoy and set realistic goals. Your health care provider can help you make an exercise plan that works for you. Exercise at a moderate or vigorous intensity most days of the week. The intensity of exercise may vary from person to person. You can tell how intense a workout is for you by paying attention to your breathing and heartbeat. Most people will notice their breathing and heartbeat get faster with more intense exercise. Do resistance training twice each week, such as: Push-ups. Sit-ups. Lifting weights. Using resistance bands. Getting short amounts of exercise can be just as helpful as long structured periods of exercise. If you have trouble finding time to exercise, try to include exercise in your daily routine. Get up, stretch, and walk around every 30 minutes throughout the day. Go for a walk during your lunch break. Park your car farther away from your destination. If you take public transportation, get off one stop early and walk the rest of the way. Make phone calls while standing up and   walking around. Take the stairs instead of elevators or escalators. Wear comfortable clothes and shoes with  good support. Do not exercise so much that you hurt yourself, feel dizzy, or get very short of breath. Where to find more information [place] Department of Health and Human Services: www.hhs.gov Centers for Disease Control and Prevention (CDC): www.cdc.gov Contact a health care provider: Before starting a new exercise program. If you have questions or concerns about your weight. If you have a medical problem that keeps you from exercising. Get help right away if you have any of the following while exercising: Injury. Dizziness. Difficulty breathing or shortness of breath that does not go away when you stop exercising. Chest pain. Rapid heartbeat. Summary Being overweight increases your risk of heart disease, stroke, diabetes, high blood pressure, and several types of cancer. Losing weight happens when you burn more calories than you eat. Reducing the amount of calories you eat in addition to getting regular moderate or vigorous exercise each week helps you lose weight. This information is not intended to replace advice given to you by your health care provider. Make sure you discuss any questions you have with your healthcare provider. Document Revised: 12/14/2019 Document Reviewed: 12/31/2019 Elsevier Patient Education  2022 [employer]  

## 2021-03-13 ENCOUNTER — Other Ambulatory Visit: Payer: Self-pay | Admitting: Nurse Practitioner

## 2021-03-13 DIAGNOSIS — M797 Fibromyalgia: Secondary | ICD-10-CM

## 2021-03-13 DIAGNOSIS — F419 Anxiety disorder, unspecified: Secondary | ICD-10-CM

## 2021-04-07 DIAGNOSIS — N644 Mastodynia: Secondary | ICD-10-CM | POA: Insufficient documentation

## 2021-04-07 DIAGNOSIS — R7303 Prediabetes: Secondary | ICD-10-CM | POA: Insufficient documentation

## 2021-04-13 ENCOUNTER — Other Ambulatory Visit: Payer: BC Managed Care – PPO

## 2021-04-13 ENCOUNTER — Ambulatory Visit (INDEPENDENT_AMBULATORY_CARE_PROVIDER_SITE_OTHER): Payer: BC Managed Care – PPO | Admitting: Nurse Practitioner

## 2021-04-13 ENCOUNTER — Other Ambulatory Visit: Payer: Self-pay

## 2021-04-13 VITALS — BP 128/72 | HR 80 | Temp 98.4°F | Ht 65.0 in | Wt 212.0 lb

## 2021-04-13 DIAGNOSIS — M5442 Lumbago with sciatica, left side: Secondary | ICD-10-CM

## 2021-04-13 DIAGNOSIS — Z6834 Body mass index (BMI) 34.0-34.9, adult: Secondary | ICD-10-CM

## 2021-04-13 DIAGNOSIS — M797 Fibromyalgia: Secondary | ICD-10-CM

## 2021-04-13 DIAGNOSIS — E6609 Other obesity due to excess calories: Secondary | ICD-10-CM | POA: Diagnosis not present

## 2021-04-13 DIAGNOSIS — M5441 Lumbago with sciatica, right side: Secondary | ICD-10-CM

## 2021-04-13 LAB — CMP14+EGFR
ALT: 15 IU/L (ref 0–32)
AST: 17 IU/L (ref 0–40)
Albumin/Globulin Ratio: 1.7 (ref 1.2–2.2)
Albumin: 4.3 g/dL (ref 3.8–4.9)
Alkaline Phosphatase: 84 IU/L (ref 44–121)
BUN/Creatinine Ratio: 18 (ref 12–28)
BUN: 14 mg/dL (ref 8–27)
Bilirubin Total: 0.7 mg/dL (ref 0.0–1.2)
CO2: 23 mmol/L (ref 20–29)
Calcium: 9.4 mg/dL (ref 8.7–10.3)
Chloride: 104 mmol/L (ref 96–106)
Creatinine, Ser: 0.78 mg/dL (ref 0.57–1.00)
Globulin, Total: 2.6 g/dL (ref 1.5–4.5)
Glucose: 106 mg/dL — ABNORMAL HIGH (ref 65–99)
Potassium: 4.3 mmol/L (ref 3.5–5.2)
Sodium: 141 mmol/L (ref 134–144)
Total Protein: 6.9 g/dL (ref 6.0–8.5)
eGFR: 87 mL/min/{1.73_m2} (ref >59)

## 2021-04-13 LAB — HEMOGLOBIN A1C
Est. average glucose Bld gHb Est-mCnc: 137 mg/dL
Hgb A1c MFr Bld: 6.4 % — ABNORMAL HIGH (ref 4.8–5.6)

## 2021-04-13 MED ORDER — KETOROLAC TROMETHAMINE 60 MG/2ML IM SOLN
60.0000 mg | Freq: Once | INTRAMUSCULAR | Status: AC
  Administered 2021-04-13: 60 mg via INTRAMUSCULAR

## 2021-04-13 MED ORDER — TRIAMCINOLONE ACETONIDE 40 MG/ML IJ SUSP
40.0000 mg | Freq: Once | INTRAMUSCULAR | Status: AC
  Administered 2021-04-13: 40 mg via INTRAMUSCULAR

## 2021-04-13 NOTE — Patient Instructions (Signed)
Acute Back Pain, Adult Acute back pain is sudden and usually short-lived. It is often caused by an injury to the muscles and tissues in the back. The injury may result from: A muscle or ligament getting overstretched or torn (strained). Ligaments are tissues that connect bones to each other. Lifting something improperly can cause a back strain. Wear and tear (degeneration) of the spinal disks. Spinal disks are circular tissue that provide cushioning between the bones of the spine (vertebrae). Twisting motions, such as while playing sports or doing yard work. A hit to the back. Arthritis. You may have a physical exam, lab tests, and imaging tests to find the cause ofyour pain. Acute back pain usually goes away with rest and home care. Follow these instructions at home: Managing pain, stiffness, and swelling Treatment may include medicines for pain and inflammation that are taken by mouth or applied to the skin, prescription pain medicine, or muscle relaxants. Take over-the-counter and prescription medicines only as told by your health care provider. Your health care provider may recommend applying ice during the first 24-48 hours after your pain starts. To do this: Put ice in a plastic bag. Place a towel between your skin and the bag. Leave the ice on for 20 minutes, 2-3 times a day. If directed, apply heat to the affected area as often as told by your health care provider. Use the heat source that your health care provider recommends, such as a moist heat pack or a heating pad. Place a towel between your skin and the heat source. Leave the heat on for 20-30 minutes. Remove the heat if your skin turns bright red. This is especially important if you are unable to feel pain, heat, or cold. You have a greater risk of getting burned. Activity  Do not stay in bed. Staying in bed for more than 1-2 days can delay your recovery. Sit up and stand up straight. Avoid leaning forward when you sit or  hunching over when you stand. If you work at a desk, sit close to it so you do not need to lean over. Keep your chin tucked in. Keep your neck drawn back, and keep your elbows bent at a 90-degree angle (right angle). Sit high and close to the steering wheel when you drive. Add lower back (lumbar) support to your car seat, if needed. Take short walks on even surfaces as soon as you are able. Try to increase the length of time you walk each day. Do not sit, drive, or stand in one place for more than 30 minutes at a time. Sitting or standing for long periods of time can put stress on your back. Do not drive or use heavy machinery while taking prescription pain medicine. Use proper lifting techniques. When you bend and lift, use positions that put less stress on your back: Bend your knees. Keep the load close to your body. Avoid twisting. Exercise regularly as told by your health care provider. Exercising helps your back heal faster and helps prevent back injuries by keeping muscles strong and flexible. Work with a physical therapist to make a safe exercise program, as recommended by your health care provider. Do any exercises as told by your physical therapist.  Lifestyle Maintain a healthy weight. Extra weight puts stress on your back and makes it difficult to have good posture. Avoid activities or situations that make you feel anxious or stressed. Stress and anxiety increase muscle tension and can make back pain worse. Learn ways to manage   anxiety and stress, such as through exercise. General instructions Sleep on a firm mattress in a comfortable position. Try lying on your side with your knees slightly bent. If you lie on your back, put a pillow under your knees. Follow your treatment plan as told by your health care provider. This may include: Cognitive or behavioral therapy. Acupuncture or massage therapy. Meditation or yoga. Contact a health care provider if: You have pain that is not  relieved with rest or medicine. You have increasing pain going down into your legs or buttocks. Your pain does not improve after 2 weeks. You have pain at night. You lose weight without trying. You have a fever or chills. Get help right away if: You develop new bowel or bladder control problems. You have unusual weakness or numbness in your arms or legs. You develop nausea or vomiting. You develop abdominal pain. You feel faint. Summary Acute back pain is sudden and usually short-lived. Use proper lifting techniques. When you bend and lift, use positions that put less stress on your back. Take over-the-counter and prescription medicines and apply heat or ice as directed by your health care provider. This information is not intended to replace advice given to you by your health care provider. Make sure you discuss any questions you have with your healthcare provider. Document Revised: 05/24/2020 Document Reviewed: 05/27/2020 Elsevier Patient Education  2022 Elsevier Inc.  

## 2021-04-13 NOTE — Progress Notes (Signed)
I,Lisa Casey,acting as a Education administrator for Lisa Brine, Lisa Casey.,have documented all relevant documentation on the behalf of Lisa Brine, Lisa Casey,as directed by  Lisa Brine, Lisa Casey while in the presence of Lisa Casey, [place].  This visit occurred during the SARS-CoV-2 public health emergency.  Safety protocols were in place, including screening questions prior to the visit, additional usage of staff PPE, and extensive cleaning of exam room while observing appropriate contact time as indicated for disinfecting solutions.  Subjective:     Patient ID: Lisa Casey , female    DOB: 01/07/1961 , 60 y.o.   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 She said it has been ongoing for a week, she does have a history of sciatica. Reports she does have a pinched nerve in her back,that goes along her hips and legs. She had been going to Dr. Maryan Rued for years for the pinched nerve. She did try to support her back with a pillow last night , which resulted her to stay up all night because of ongoing pain. Along with fibromyalgia  she suffers from. While she is here, she wants to know if it is appropriateto get her Shingrix.    Back Pain This is a new problem. The problem occurs constantly. The problem has been rapidly improving since onset. The quality of the pain is described as aching, burning and shooting. The pain is at a severity of 8/10. The pain is severe. The pain is The same all the time. The symptoms are aggravated by lying down and sitting. Stiffness is present All day. Associated symptoms include leg pain (will also have rigt lateral calf pain). Pertinent negatives include no headaches. (Left hip) Risk factors include sedentary lifestyle. She has tried muscle relaxant and analgesics for the symptoms. The treatment provided no relief.    Past Medical History:   Diagnosis Date   Anxiety    HX OF ANXIETY ATTACKS   Back pain, chronic    Cholelithiasis 04/15/2021   Diabetes ([place])    started Metformin in 02/2021   Fibromyalgia    GERD (gastroesophageal reflux disease)    Hypertension    Migraines    HX OF MIGRAINES   Obesity    Shortened PR interval      Family History  Problem Relation Age of Onset   Breast cancer Mother 28   Breast cancer Paternal Grandmother    Heart failure Neg Hx    Coronary artery disease Neg Hx      Current Outpatient Medications:    albuterol (VENTOLIN HFA) 108 (90 Base) MCG/ACT inhaler, Inhale 2 puffs into the lungs every 6 (six) hours as needed for wheezing or shortness of breath., Disp: 8 g, Rfl: 2   amLODipine (NORVASC) 2.5 MG tablet, Take 1 tablet by mouth once daily, Disp: 90 tablet, Rfl: 0   cyclobenzaprine (FLEXERIL) 5 MG tablet, Take 1 tablet by mouth three times daily as needed for muscle spasm, Disp: 40 tablet, Rfl: 0   fluticasone (FLONASE) 50 MCG/ACT nasal spray, Place 2 sprays into both nostrils daily., Disp: 16 g, Rfl: 0   hydrOXYzine (VISTARIL) 25 MG capsule, TAKE 1 CAPSULE BY MOUTH EVERY 6 HOURS AS NEEDED, Disp: 30 capsule, Rfl: 0   levocetirizine (XYZAL) 5 MG tablet, Take 1 tablet (5 mg total) by mouth every evening., Disp:  90 tablet, Rfl: 1   lisdexamfetamine (VYVANSE) 20 MG capsule, Take 1 capsule (20 mg total) by mouth daily., Disp: 30 capsule, Rfl: 0   metFORMIN (GLUCOPHAGE) 500 MG tablet, Take 1 tablet (500 mg total) by mouth daily., Disp: 30 tablet, Rfl: 2   PARoxetine (PAXIL) 20 MG tablet, Take 1 tablet by mouth once daily, Disp: 90 tablet, Rfl: 0   pregabalin (LYRICA) 150 MG capsule, Take 1 capsule (150 mg total) by mouth 2 (two) times daily., Disp: 60 capsule, Rfl: 3   acetaminophen (TYLENOL) 500 MG tablet, Take 1,000 mg by mouth every 6 (six) hours as needed for mild pain or headache., Disp: , Rfl:    aspirin EC 81 MG EC tablet, Take 1 tablet (81 mg total) by mouth daily. Swallow whole.,  Disp: 30 tablet, Rfl: 11   clopidogrel (PLAVIX) 75 MG tablet, Take 1 tablet (75 mg total) by mouth daily., Disp: 30 tablet, Rfl: 3   metoprolol tartrate (LOPRESSOR) 25 MG tablet, Take 0.5 tablets (12.5 mg total) by mouth 2 (two) times daily., Disp: 60 tablet, Rfl: 0   neomycin-polymyxin b-dexamethasone (MAXITROL) 3.5-10000-0.1 OINT, Place 1 application into both eyes at bedtime as needed (eye irritation)., Disp: , Rfl:    Probiotic Product (PROBIOTIC PO), Take 1 capsule by mouth daily., Disp: , Rfl:    RESTASIS 0.05 % ophthalmic emulsion, Place 1 drop into both eyes in the morning and at bedtime., Disp: , Rfl:  No current facility-administered medications for this visit.  Facility-Administered Medications Ordered in Other Visits:    0.9 %  sodium chloride infusion, 250 mL, Intravenous, PRN, Lauree Chandler D, MD   0.9 %  sodium chloride infusion, 250 mL, Intravenous, PRN, Burnell Blanks, MD   acetaminophen (TYLENOL) tablet 650 mg, 650 mg, Oral, Q4H PRN, Burnell Blanks, MD, 650 mg at 04/16/21 1705   albuterol (VENTOLIN HFA) 108 (90 Base) MCG/ACT inhaler 2 puff, 2 puff, Inhalation, Q6H PRN, Burnell Blanks, MD   ALPRAZolam Duanne Moron) tablet 0.25 mg, 0.25 mg, Oral, TID PRN, Burnell Blanks, MD, 0.25 mg at 04/17/21 2129   amLODipine (NORVASC) tablet 2.5 mg, 2.5 mg, Oral, Daily, Cherlynn Kaiser A, MD, 2.5 mg at 04/18/21 1226   aspirin EC tablet 81 mg, 81 mg, Oral, Daily, Burnell Blanks, MD, 81 mg at 04/18/21 1202   atorvastatin (LIPITOR) tablet 40 mg, 40 mg, Oral, q1800, Burnell Blanks, MD, 40 mg at 04/17/21 1740   clopidogrel (PLAVIX) tablet 75 mg, 75 mg, Oral, Daily, Cherlynn Kaiser A, MD, 75 mg at 04/18/21 1226   cyclobenzaprine (FLEXERIL) tablet 5 mg, 5 mg, Oral, TID PRN, Burnell Blanks, MD   cycloSPORINE (RESTASIS) 0.05 % ophthalmic emulsion 1 drop, 1 drop, Both Eyes, BID, Burnell Blanks, MD, 1 drop at 04/17/21 2129    hydrOXYzine (ATARAX/VISTARIL) tablet 25 mg, 25 mg, Oral, Q6H PRN, Burnell Blanks, MD   insulin aspart (novoLOG) injection 0-15 Units, 0-15 Units, Subcutaneous, TID WC, Burnell Blanks, MD, 2 Units at 04/16/21 1230   insulin aspart (novoLOG) injection 0-5 Units, 0-5 Units, Subcutaneous, QHS, McAlhany, Christopher D, MD   metoprolol tartrate (LOPRESSOR) tablet 12.5 mg, 12.5 mg, Oral, BID, Burnell Blanks, MD, 12.5 mg at 04/18/21 1202   neomycin-polymyxin b-dexamethasone (MAXITROL) ophthalmic ointment 1 application, 1 application, Both Eyes, QHS PRN, Burnell Blanks, MD   nitroGLYCERIN (NITROSTAT) SL tablet 0.4 mg, 0.4 mg, Sublingual, PRN, Burnell Blanks, MD   ondansetron [facility]) injection 4 mg, 4 mg, Intravenous,  Q6H PRN, Burnell Blanks, MD   PARoxetine (PAXIL) tablet 20 mg, 20 mg, Oral, Daily, Burnell Blanks, MD, 20 mg at 04/18/21 1202   pregabalin (LYRICA) capsule 150 mg, 150 mg, Oral, BID PRN, Burnell Blanks, MD   sodium chloride flush (NS) 0.9 % injection 3 mL, 3 mL, Intravenous, Q12H, Burnell Blanks, MD, 3 mL at 04/18/21 1227   sodium chloride flush (NS) 0.9 % injection 3 mL, 3 mL, Intravenous, PRN, Burnell Blanks, MD   sodium chloride flush (NS) 0.9 % injection 3 mL, 3 mL, Intravenous, Q12H, Lauree Chandler D, MD, 3 mL at 04/18/21 1229   sodium chloride flush (NS) 0.9 % injection 3 mL, 3 mL, Intravenous, Q12H, Lauree Chandler D, MD, 3 mL at 04/18/21 1227   sodium chloride flush (NS) 0.9 % injection 3 mL, 3 mL, Intravenous, PRN, Burnell Blanks, MD   Allergies  Allergen Reactions   Codeine Itching    With rash     Review of Systems  Constitutional: Negative.   Respiratory: Negative.    Cardiovascular: Negative.   Musculoskeletal:  Positive for back pain.  Neurological: Negative.  Negative for headaches.  Psychiatric/Behavioral: Negative.      Today's Vitals   04/13/21 1047   BP: 128/72  Pulse: 80  Temp: 98.4 F (36.9 C)  Weight: 212 lb (96.2 kg)  Height: '5\' 5"'$  (1.651 m)   Body mass index is 35.28 kg/m.  Wt Readings from Last 3 Encounters:  04/17/21 207 lb 3.7 oz (94 kg)  04/13/21 212 lb (96.2 kg)  03/09/21 212 lb 6.4 oz (96.3 kg)    Objective:  Physical Exam Vitals reviewed.  Constitutional:      General: She is not in acute distress.    Appearance: Normal appearance. She is obese.  Cardiovascular:     Rate and Rhythm: Normal rate and regular rhythm.     Pulses: Normal pulses.     Heart sounds: Normal heart sounds. No murmur heard. Pulmonary:     Effort: Pulmonary effort is normal. No respiratory distress.     Breath sounds: Normal breath sounds.  Musculoskeletal:        General: Tenderness (low back pain at sacral area, negative radiculopathy) present.  Skin:    Capillary Refill: Capillary refill takes less than 2 seconds.  Neurological:     General: No focal deficit present.     Mental Status: She is alert and oriented to person, place, and time.     Cranial Nerves: No cranial nerve deficit.     Motor: No weakness.  Psychiatric:        Mood and Affect: Mood normal.        Behavior: Behavior normal.        Thought Content: Thought content normal.        Judgment: Judgment normal.        Assessment And Plan:     1. Acute left-sided low back pain with bilateral sciatica Comments: Treated with Kenalog and Toradol Given days off  - ketorolac (TORADOL) injection 60 mg - triamcinolone acetonide (KENALOG-40) injection 40 mg - Ambulatory referral to Chiropractic  2. Fibromyalgia  3. Class 1 obesity due to excess calories without serious comorbidity with body mass index (BMI) of 34.0 to 34.9 in adult  She is encouraged to strive for BMI less than 30 to decrease cardiac risk. Advised to aim for at least 150 minutes of exercise per week.   Patient was given opportunity to ask  questions. Patient verbalized understanding of the plan and  was able to repeat key elements of the plan. All questions were answered to their satisfaction.  Lisa Brine, Lisa Casey   I, Lisa Brine, Lisa Casey, have reviewed all documentation for this visit. The documentation on 04/13/21 for the exam, diagnosis, procedures, and orders are all accurate and complete.   IF YOU HAVE BEEN REFERRED TO A SPECIALIST, IT MAY TAKE 1-2 WEEKS TO SCHEDULE/PROCESS THE REFERRAL. IF YOU HAVE NOT HEARD FROM US/SPECIALIST IN TWO WEEKS, PLEASE GIVE [place] A CALL AT [phone].   THE PATIENT IS ENCOURAGED TO PRACTICE SOCIAL DISTANCING DUE TO THE COVID-19 PANDEMIC.

## 2021-04-15 ENCOUNTER — Inpatient Hospital Stay (HOSPITAL_COMMUNITY): Payer: BC Managed Care – PPO

## 2021-04-15 ENCOUNTER — Emergency Department (HOSPITAL_COMMUNITY): Payer: BC Managed Care – PPO

## 2021-04-15 ENCOUNTER — Encounter (HOSPITAL_COMMUNITY): Payer: Self-pay | Admitting: Student

## 2021-04-15 ENCOUNTER — Inpatient Hospital Stay (HOSPITAL_COMMUNITY)
Admission: EM | Admit: 2021-04-15 | Discharge: 2021-04-18 | DRG: 281 | Disposition: A | Discharge status: Home / Self Care | Payer: BC Managed Care – PPO | Attending: Internal Medicine | Admitting: Internal Medicine

## 2021-04-15 DIAGNOSIS — R7303 Prediabetes: Secondary | ICD-10-CM | POA: Diagnosis present

## 2021-04-15 DIAGNOSIS — N1330 Unspecified hydronephrosis: Secondary | ICD-10-CM | POA: Diagnosis present

## 2021-04-15 DIAGNOSIS — Z87891 Personal history of nicotine dependence: Secondary | ICD-10-CM | POA: Diagnosis not present

## 2021-04-15 DIAGNOSIS — I10 Essential (primary) hypertension: Secondary | ICD-10-CM | POA: Diagnosis present

## 2021-04-15 DIAGNOSIS — F411 Generalized anxiety disorder: Secondary | ICD-10-CM | POA: Diagnosis present

## 2021-04-15 DIAGNOSIS — F419 Anxiety disorder, unspecified: Secondary | ICD-10-CM | POA: Diagnosis not present

## 2021-04-15 DIAGNOSIS — I671 Cerebral aneurysm, nonruptured: Secondary | ICD-10-CM | POA: Diagnosis present

## 2021-04-15 DIAGNOSIS — Z885 Allergy status to narcotic agent status: Secondary | ICD-10-CM | POA: Diagnosis not present

## 2021-04-15 DIAGNOSIS — F909 Attention-deficit hyperactivity disorder, unspecified type: Secondary | ICD-10-CM | POA: Diagnosis present

## 2021-04-15 DIAGNOSIS — Z79899 Other long term (current) drug therapy: Secondary | ICD-10-CM

## 2021-04-15 DIAGNOSIS — R911 Solitary pulmonary nodule: Secondary | ICD-10-CM | POA: Diagnosis present

## 2021-04-15 DIAGNOSIS — K8020 Calculus of gallbladder without cholecystitis without obstruction: Secondary | ICD-10-CM

## 2021-04-15 DIAGNOSIS — Z9851 Tubal ligation status: Secondary | ICD-10-CM | POA: Diagnosis not present

## 2021-04-15 DIAGNOSIS — E1169 Type 2 diabetes mellitus with other specified complication: Secondary | ICD-10-CM | POA: Diagnosis present

## 2021-04-15 DIAGNOSIS — I214 Non-ST elevation (NSTEMI) myocardial infarction: Secondary | ICD-10-CM | POA: Diagnosis present

## 2021-04-15 DIAGNOSIS — R079 Chest pain, unspecified: Secondary | ICD-10-CM | POA: Diagnosis present

## 2021-04-15 DIAGNOSIS — R519 Headache, unspecified: Secondary | ICD-10-CM | POA: Diagnosis present

## 2021-04-15 DIAGNOSIS — R7989 Other specified abnormal findings of blood chemistry: Secondary | ICD-10-CM

## 2021-04-15 DIAGNOSIS — R778 Other specified abnormalities of plasma proteins: Secondary | ICD-10-CM

## 2021-04-15 DIAGNOSIS — Z7984 Long term (current) use of oral hypoglycemic drugs: Secondary | ICD-10-CM

## 2021-04-15 DIAGNOSIS — R0982 Postnasal drip: Secondary | ICD-10-CM

## 2021-04-15 DIAGNOSIS — Z90710 Acquired absence of both cervix and uterus: Secondary | ICD-10-CM

## 2021-04-15 DIAGNOSIS — M797 Fibromyalgia: Secondary | ICD-10-CM | POA: Diagnosis present

## 2021-04-15 DIAGNOSIS — Z6835 Body mass index (BMI) 35.0-35.9, adult: Secondary | ICD-10-CM

## 2021-04-15 DIAGNOSIS — E669 Obesity, unspecified: Secondary | ICD-10-CM | POA: Diagnosis present

## 2021-04-15 DIAGNOSIS — Z20822 Contact with and (suspected) exposure to covid-19: Secondary | ICD-10-CM | POA: Diagnosis present

## 2021-04-15 HISTORY — DX: Obesity, unspecified: E66.9

## 2021-04-15 HISTORY — DX: Calculus of gallbladder without cholecystitis without obstruction: K80.20

## 2021-04-15 HISTORY — DX: Migraine, unspecified, not intractable, without status migrainosus: G43.909

## 2021-04-15 HISTORY — DX: Abnormal electrocardiogram (ECG) (EKG): R94.31

## 2021-04-15 HISTORY — DX: Type 2 diabetes mellitus without complications: E11.9

## 2021-04-15 LAB — BASIC METABOLIC PANEL
Anion gap: 7 (ref 5–15)
BUN: 18 mg/dL (ref 6–20)
CO2: 24 mmol/L (ref 22–32)
Calcium: 8.7 mg/dL — ABNORMAL LOW (ref 8.9–10.3)
Chloride: 106 mmol/L (ref 98–111)
Creatinine, Ser: 0.93 mg/dL (ref 0.44–1.00)
GFR, Estimated: >60 mL/min (ref >60)
Glucose, Bld: 156 mg/dL — ABNORMAL HIGH (ref 70–99)
Potassium: 3.6 mmol/L (ref 3.5–5.1)
Sodium: 137 mmol/L (ref 135–145)

## 2021-04-15 LAB — CBC
HCT: 39.4 % (ref 36.0–46.0)
Hemoglobin: 12.8 g/dL (ref 12.0–15.0)
MCH: 30.5 pg (ref 26.0–34.0)
MCHC: 32.5 g/dL (ref 30.0–36.0)
MCV: 93.8 fL (ref 80.0–100.0)
Platelets: 258 10*3/uL (ref 150–400)
RBC: 4.2 MIL/uL (ref 3.87–5.11)
RDW: 11.7 % (ref 11.5–15.5)
WBC: 7.4 10*3/uL (ref 4.0–10.5)
nRBC: 0 % (ref 0.0–0.2)

## 2021-04-15 LAB — GLUCOSE, CAPILLARY
Glucose-Capillary: 104 mg/dL — ABNORMAL HIGH (ref 70–99)
Glucose-Capillary: 122 mg/dL — ABNORMAL HIGH (ref 70–99)

## 2021-04-15 LAB — TROPONIN I (HIGH SENSITIVITY)
Troponin I (High Sensitivity): 4 ng/L (ref <18)
Troponin I (High Sensitivity): 632 ng/L (ref <18)
Troponin I (High Sensitivity): 2145 ng/L
Troponin I (High Sensitivity): 1865 ng/L (ref <18)
Troponin I (High Sensitivity): 931 ng/L (ref <18)
Troponin I (High Sensitivity): 689 ng/L (ref <18)

## 2021-04-15 LAB — PROTIME-INR
INR: 0.9 (ref 0.8–1.2)
Prothrombin Time: 12.1 seconds (ref 11.4–15.2)

## 2021-04-15 LAB — RESP PANEL BY RT-PCR (FLU A&B, COVID) ARPGX2
Influenza A by PCR: NEGATIVE
Influenza B by PCR: NEGATIVE
SARS Coronavirus 2 by RT PCR: NEGATIVE

## 2021-04-15 LAB — HEPARIN LEVEL (UNFRACTIONATED): Heparin Unfractionated: 0.27 IU/mL — ABNORMAL LOW (ref 0.30–0.70)

## 2021-04-15 LAB — ECHOCARDIOGRAM COMPLETE
Area-P 1/2: 3.85 cm2
Height: 65 in
S' Lateral: 2.1 cm
Weight: 3392 oz

## 2021-04-15 LAB — CBG MONITORING, ED: Glucose-Capillary: 107 mg/dL — ABNORMAL HIGH (ref 70–99)

## 2021-04-15 LAB — D-DIMER, QUANTITATIVE: D-Dimer, Quant: 0.78 ug/mL-FEU — ABNORMAL HIGH (ref 0.00–0.50)

## 2021-04-15 LAB — I-STAT BETA HCG BLOOD, ED (MC, WL, AP ONLY): I-stat hCG, quantitative: <5 m[IU]/mL (ref <5)

## 2021-04-15 LAB — HIV ANTIBODY (ROUTINE TESTING W REFLEX): HIV Screen 4th Generation wRfx: NONREACTIVE

## 2021-04-15 LAB — MAGNESIUM: Magnesium: 2 mg/dL (ref 1.7–2.4)

## 2021-04-15 MED ORDER — FENTANYL CITRATE (PF) 100 MCG/2ML IJ SOLN
50.0000 ug | Freq: Once | INTRAMUSCULAR | Status: AC
  Administered 2021-04-15: 50 ug via INTRAVENOUS
  Filled 2021-04-15: qty 2

## 2021-04-15 MED ORDER — ALBUTEROL SULFATE HFA 108 (90 BASE) MCG/ACT IN AERS
2.0000 | INHALATION_SPRAY | Freq: Four times a day (QID) | RESPIRATORY_TRACT | Status: DC | PRN
  Filled 2021-04-15: qty 6.7

## 2021-04-15 MED ORDER — NITROGLYCERIN 0.4 MG SL SUBL
0.4000 mg | SUBLINGUAL_TABLET | SUBLINGUAL | Status: AC | PRN
  Administered 2021-04-15 (×3): 0.4 mg via SUBLINGUAL
  Filled 2021-04-15 (×2): qty 1

## 2021-04-15 MED ORDER — HYDROXYZINE HCL 25 MG PO TABS: 25.0000 mg | ORAL_TABLET | Freq: Four times a day (QID) | ORAL | Status: DC | PRN

## 2021-04-15 MED ORDER — IOHEXOL 350 MG/ML SOLN
100.0000 mL | Freq: Once | INTRAVENOUS | Status: AC | PRN
  Administered 2021-04-15: 100 mL via INTRAVENOUS

## 2021-04-15 MED ORDER — METOPROLOL TARTRATE 12.5 MG HALF TABLET
12.5000 mg | ORAL_TABLET | Freq: Two times a day (BID) | ORAL | Status: DC
  Administered 2021-04-15 – 2021-04-18 (×7): 12.5 mg via ORAL
  Filled 2021-04-15 (×7): qty 1

## 2021-04-15 MED ORDER — HEPARIN BOLUS VIA INFUSION
4000.0000 [IU] | Freq: Once | INTRAVENOUS | Status: AC
  Administered 2021-04-15: 4000 [IU] via INTRAVENOUS
  Filled 2021-04-15: qty 4000

## 2021-04-15 MED ORDER — CYCLOBENZAPRINE HCL 10 MG PO TABS: 5.0000 mg | ORAL_TABLET | Freq: Three times a day (TID) | ORAL | Status: DC | PRN

## 2021-04-15 MED ORDER — ACETAMINOPHEN 325 MG PO TABS
650.0000 mg | ORAL_TABLET | ORAL | Status: DC | PRN
  Administered 2021-04-15 – 2021-04-16 (×2): 650 mg via ORAL
  Filled 2021-04-15 (×2): qty 2

## 2021-04-15 MED ORDER — SODIUM CHLORIDE 0.9 % IV SOLN: 250.0000 mL | INTRAVENOUS | Status: DC | PRN

## 2021-04-15 MED ORDER — NITROGLYCERIN 0.4 MG SL SUBL: 0.4000 mg | SUBLINGUAL_TABLET | SUBLINGUAL | Status: DC | PRN

## 2021-04-15 MED ORDER — POTASSIUM CHLORIDE CRYS ER 20 MEQ PO TBCR
40.0000 meq | EXTENDED_RELEASE_TABLET | Freq: Once | ORAL | Status: AC
  Administered 2021-04-15: 40 meq via ORAL
  Filled 2021-04-15: qty 2

## 2021-04-15 MED ORDER — CYCLOSPORINE 0.05 % OP EMUL
1.0000 [drp] | Freq: Two times a day (BID) | OPHTHALMIC | Status: DC
  Administered 2021-04-15 – 2021-04-17 (×4): 1 [drp] via OPHTHALMIC
  Filled 2021-04-15 (×8): qty 1

## 2021-04-15 MED ORDER — SODIUM CHLORIDE 0.9% FLUSH
3.0000 mL | Freq: Two times a day (BID) | INTRAVENOUS | Status: DC
Start: 2021-04-15 — End: 2021-04-18
  Administered 2021-04-16 – 2021-04-18 (×4): 3 mL via INTRAVENOUS

## 2021-04-15 MED ORDER — ONDANSETRON HCL 4 MG/2ML IJ SOLN: 4.0000 mg | Freq: Four times a day (QID) | INTRAMUSCULAR | Status: DC | PRN

## 2021-04-15 MED ORDER — PAROXETINE HCL 20 MG PO TABS
20.0000 mg | ORAL_TABLET | Freq: Every day | ORAL | Status: DC
  Administered 2021-04-15 – 2021-04-18 (×4): 20 mg via ORAL
  Filled 2021-04-15 (×4): qty 1

## 2021-04-15 MED ORDER — HEPARIN (PORCINE) 25000 UT/250ML-% IV SOLN
1200.0000 [IU]/h | INTRAVENOUS | Status: DC
  Administered 2021-04-15: 1000 [IU]/h via INTRAVENOUS
  Administered 2021-04-16 – 2021-04-17 (×2): 1200 [IU]/h via INTRAVENOUS
  Filled 2021-04-15 (×3): qty 250

## 2021-04-15 MED ORDER — NEOMYCIN-POLYMYXIN-DEXAMETH 3.5-10000-0.1 OP OINT
1.0000 "application " | TOPICAL_OINTMENT | Freq: Every evening | OPHTHALMIC | Status: DC | PRN
  Filled 2021-04-15: qty 3.5

## 2021-04-15 MED ORDER — INSULIN ASPART 100 UNIT/ML IJ SOLN: 0.0000 [IU] | Freq: Every day | INTRAMUSCULAR | Status: DC

## 2021-04-15 MED ORDER — ASPIRIN EC 81 MG PO TBEC
81.0000 mg | DELAYED_RELEASE_TABLET | Freq: Every day | ORAL | Status: DC
  Administered 2021-04-16 – 2021-04-18 (×3): 81 mg via ORAL
  Filled 2021-04-15 (×3): qty 1

## 2021-04-15 MED ORDER — ATORVASTATIN CALCIUM 40 MG PO TABS
40.0000 mg | ORAL_TABLET | Freq: Every day | ORAL | Status: DC
  Administered 2021-04-16 – 2021-04-17 (×2): 40 mg via ORAL
  Filled 2021-04-15 (×3): qty 1

## 2021-04-15 MED ORDER — SODIUM CHLORIDE 0.9% FLUSH: 3.0000 mL | INTRAVENOUS | Status: DC | PRN

## 2021-04-15 MED ORDER — HYDROXYZINE PAMOATE 25 MG PO CAPS
25.0000 mg | ORAL_CAPSULE | Freq: Four times a day (QID) | ORAL | Status: DC | PRN
  Filled 2021-04-15: qty 1

## 2021-04-15 MED ORDER — SODIUM CHLORIDE 0.9% FLUSH
3.0000 mL | Freq: Two times a day (BID) | INTRAVENOUS | Status: DC
  Administered 2021-04-16 – 2021-04-18 (×4): 3 mL via INTRAVENOUS

## 2021-04-15 MED ORDER — INSULIN ASPART 100 UNIT/ML IJ SOLN
0.0000 [IU] | Freq: Three times a day (TID) | INTRAMUSCULAR | Status: DC
  Administered 2021-04-16: 2 [IU] via SUBCUTANEOUS

## 2021-04-15 MED ORDER — PREGABALIN 75 MG PO CAPS: 150.0000 mg | ORAL_CAPSULE | Freq: Two times a day (BID) | ORAL | Status: DC | PRN

## 2021-04-15 NOTE — ED Provider Notes (Signed)
[facility] EMERGENCY DEPARTMENT Provider Note   CSN: [id number] Arrival date & time: 04/15/21  0440     History Chief Complaint  Patient presents with   Chest Pain    Lisa Casey is a 60 y.o. female.  Patient with PMH of obesity, HTN, and prediabetes presents to the ED with a chief complaint of chest pain.  She states that the pain awakened her from sleep about 1 hour ago.  She describes it as severe.  She was given aspiring 324 and nitro by EMS.  She reports some improvement with the nitro.  She denies fever or cough.  She states that she has had some slight SOB. She denies radiating pain. She states that she recently dove to [place].  She denies hx of PE.  She has had some right calf pain, but states that it is not new.   The history is provided by the patient. No language interpreter was used.      Past Medical History:  Diagnosis Date   Anxiety    HX OF ANXIETY ATTACKS   Back pain, chronic    Fibromyalgia    PT ON LYRIC    GERD (gastroesophageal reflux disease)    NOT ON ANY MEDS AT PRESENT   Headache(784.0)    HX OF MIGRAINES   Hypertension    HX OF ELEVATED B/P FOR SEVERAL YRS--NO MEDS UNTIL LAST WEEK--PT WAS C/O OF CHEST PRESSURE AND B/P ELEVATED--PT SAW NURSE PRACTIONER ON 08/21/11 AND STATES EKG WAS DONE--TOLD ABNORMAL--PUT ON B/P MEDICATION LISINOPRIL AND TOLD SHE WAS TO SEE A CARDIOLOGIST.   Shortness of breath    WITH EXERTION--NEW FOR PT--PT ALSO REPORTS FEELING TIRED    Patient Active Problem List   Diagnosis Date Noted   Breast pain 04/07/2021   Prediabetes 04/07/2021   Essential hypertension 08/09/2020   Abnormal glucose 08/09/2020   Obesity (BMI 30-39.9) 08/09/2020   Trigger finger, right middle finger 06/22/2020   Leiomyoma of uterus, unspecified 09/14/2011    Past Surgical History:  Procedure Laterality Date   CONSTIPATION     CYSTOSCOPY W/ URETERAL STENT PLACEMENT  09/23/2012   Procedure: CYSTOSCOPY WITH RETROGRADE  PYELOGRAM/URETERAL STENT PLACEMENT;  Surgeon: Alexis Frock, MD;  Location: [facility];  Service: Urology;  Laterality: Right;   CYSTOSCOPY WITH RETROGRADE PYELOGRAM, URETEROSCOPY AND STENT PLACEMENT  10/08/2012   Procedure: CYSTOSCOPY WITH RETROGRADE PYELOGRAM, URETEROSCOPY AND STENT PLACEMENT;  Surgeon: Alexis Frock, MD;  Location: [facility];  Service: Urology;  Laterality: Right;  90 MIN NO RETROGRADE NEEDS FLEX URETEROSCOPE    HOLMIUM LASER APPLICATION  [id number]   Procedure: HOLMIUM LASER APPLICATION;  Surgeon: Alexis Frock, MD;  Location: [facility];  Service: Urology;  Laterality: Right;   LEFT HEART CATHETERIZATION WITH CORONARY ANGIOGRAM N/A 10/29/2011   Procedure: LEFT HEART CATHETERIZATION WITH CORONARY ANGIOGRAM;  Surgeon: Leonie Man, MD;  Location: [facility] CATH LAB;  Service: Cardiovascular;  Laterality: N/A;   ROBOTIC ASSISTED LAP VAGINAL HYSTERECTOMY  09/14/2011   Procedure: ROBOTIC ASSISTED LAPAROSCOPIC VAGINAL HYSTERECTOMY;  Surgeon: Agnes Lawrence, MD;  Location: [facility];  Service: Gynecology;  Laterality: N/A;   TUBAL LIGATION       OB History   No obstetric history on file.     Family History  Problem Relation Age of Onset   Breast cancer Mother 72   Breast cancer Paternal Grandmother     Social History   Tobacco Use   Smoking status: Never  Smokeless tobacco: Never  Substance Use Topics   Alcohol use: No   Drug use: No    Home Medications Prior to Admission medications   Medication Sig Start Date End Date Taking? Authorizing Provider  albuterol (VENTOLIN HFA) 108 (90 Base) MCG/ACT inhaler Inhale 2 puffs into the lungs every 6 (six) hours as needed for wheezing or shortness of breath. 09/13/20   [name], FNP  amLODipine (NORVASC) 2.5 MG tablet Take 1 tablet by mouth once daily 03/13/21   [name], FNP  azelastine (ASTELIN) 0.1 % nasal spray Place 2 sprays into both nostrils 2 (two) times daily. Use in each  nostril as directed 01/02/21   [name], FNP  benzonatate (TESSALON PERLES) 100 MG capsule Take 1 capsule (100 mg total) by mouth 3 (three) times daily as needed for cough. Patient not taking: Reported on 03/09/2021 01/05/21 01/05/22  Bary Castilla, NP  cephALEXin (KEFLEX) 500 MG capsule Take 500 mg by mouth 2 (two) times daily. Patient not taking: Reported on 04/13/2021 03/02/21   [provider]  cyclobenzaprine (FLEXERIL) 5 MG tablet Take 1 tablet by mouth three times daily as needed for muscle spasm 03/13/21   [name], FNP  doxycycline (VIBRA-TABS) 100 MG tablet Take 100 mg by mouth 2 (two) times daily. 03/02/21   [provider]  fluconazole (DIFLUCAN) 150 MG tablet Take 150 mg by mouth once. Patient not taking: Reported on 04/13/2021 03/02/21   [provider]  fluticasone (FLONASE) 50 MCG/ACT nasal spray Place 2 sprays into both nostrils daily. 11/05/18   [place], [place], PA-C  hydrOXYzine (VISTARIL) 25 MG capsule TAKE 1 CAPSULE BY MOUTH EVERY 6 HOURS AS NEEDED 03/13/21   [name], FNP  levocetirizine (XYZAL) 5 MG tablet Take 1 tablet (5 mg total) by mouth every evening. 01/02/21   [name], FNP  lisdexamfetamine (VYVANSE) 20 MG capsule Take 1 capsule (20 mg total) by mouth daily. 03/09/21   [name], FNP  metFORMIN (GLUCOPHAGE) 500 MG tablet Take 1 tablet (500 mg total) by mouth daily. 03/09/21 03/09/22  [name], FNP  neomycin-polymyxin b-dexamethasone (MAXITROL) 3.5-10000-0.1 OINT  03/02/21   [provider]  PARoxetine (PAXIL) 20 MG tablet Take 1 tablet by mouth once daily 12/05/20   [name], FNP  pregabalin (LYRICA) 150 MG capsule Take 1 capsule (150 mg total) by mouth 2 (two) times daily. 10/07/20   [name], FNP    Allergies    Codeine  Review of Systems   Review of Systems  All other systems reviewed and are negative.  Physical Exam Updated Vital Signs BP 138/81 (BP Location: Right Arm)   Pulse 83   Temp 97.6  F (36.4 C) (Oral)   Resp (!) 30   Ht '5\' 5"'$  (1.651 m)   Wt 96.2 kg   LMP 08/18/2011   SpO2 92%   BMI 35.28 kg/m   Physical Exam Vitals and nursing note reviewed.  Constitutional:      General: She is not in acute distress.    Appearance: She is well-developed.  HENT:     Head: Normocephalic and atraumatic.  Eyes:     Conjunctiva/sclera: Conjunctivae normal.  Cardiovascular:     Rate and Rhythm: Normal rate and regular rhythm.     Heart sounds: No murmur heard. Pulmonary:     Effort: Pulmonary effort is normal. No respiratory distress.     Breath sounds: Normal breath sounds.  Abdominal:     Palpations: Abdomen is soft.  Tenderness: There is no abdominal tenderness.  Musculoskeletal:        General: Normal range of motion.     Cervical back: Neck supple.  Skin:    General: Skin is warm and dry.  Neurological:     Mental Status: She is alert and oriented to person, place, and time.  Psychiatric:        Mood and Affect: Mood normal.        Behavior: Behavior normal.    ED Results / Procedures / Treatments   Labs (all labs ordered are listed, but only abnormal results are displayed) Labs Reviewed  BASIC METABOLIC PANEL  CBC  D-DIMER, QUANTITATIVE  I-STAT BETA HCG BLOOD, ED ([facility], [facility], [facility] ONLY)  TROPONIN I (HIGH SENSITIVITY)    EKG EKG Interpretation  Date/Time:  Saturday April 15 2021 04:48:33 EDT Ventricular Rate:  65 PR Interval:  115 QRS Duration: 85 QT Interval:  466 QTC Calculation: 485 R Axis:   36 Text Interpretation: Sinus rhythm Borderline short PR interval Borderline T wave abnormalities Confirmed by Quintella Reichert [phone]) on 04/15/2021 5:08:35 AM  Radiology No results found.  Procedures Procedures   Medications Ordered in ED Medications  nitroGLYCERIN (NITROSTAT) SL tablet 0.4 mg (has no administration in time range)    ED Course  I have reviewed the triage vital signs and the nursing notes.  Pertinent labs & imaging results that were  available during my care of the patient were reviewed by me and considered in my medical decision making (see chart for details).    MDM Rules/Calculators/A&P                           Patient here with CP that started just prior to arrival.  It awakened her from sleep.  She is noted to have slightly decreased O2 sat.    RN reports that O2 sat drops during transfer from wheel chair to bed.  Placed on supplemental O2.    D-dimer is elevated at 0.78.   Trop is 4.  Repeat pending.  CXR and dissection study pending.  Case signed out to oncoming team, who will continue care.  EKG shows some t-wave inversions when compared to previous.  Anticipate admission for chest pain rule out if imaging studies are negative.  Case signed out to [facility], who will continue care. Final Clinical Impression(s) / ED Diagnoses Final diagnoses:  Nonspecific chest pain  NSTEMI (non-ST elevated myocardial infarction) [facility])    Rx / [place]essel Orders ED Discharge Orders     None        Montine Circle, PA-C 04/16/21 [id number]    Quintella Reichert, MD 04/16/21 [phone]

## 2021-04-15 NOTE — Progress Notes (Signed)
  Echocardiogram 2D Echocardiogram has been performed.  Lisa Casey 04/15/2021, 3:13 PM

## 2021-04-15 NOTE — Progress Notes (Signed)
ANTICOAGULATION CONSULT NOTE   Pharmacy Consult for Heparin Indication: chest pain/ACS  Allergies  Allergen Reactions   Codeine Itching    With rash    Patient Measurements: Height: '5\' 5"'$  (165.1 cm) Weight: 96.2 kg (212 lb) IBW/kg (Calculated) : 57 Heparin Dosing Weight: 78.7 kg  Vital Signs: BP: 141/82 (07/30 1455) Pulse Rate: 65 (07/30 1430)  Labs: Recent Labs    04/13/21 1018 04/15/21 0511 04/15/21 0511 04/15/21 0806 04/15/21 0938 04/15/21 1503 04/15/21 1547 04/15/21 1749  HGB  --  12.8  --   --   --   --   --   --   HCT  --  39.4  --   --   --   --   --   --   PLT  --  258  --   --   --   --   --   --   LABPROT  --   --   --   --  12.1  --   --   --   INR  --   --   --   --  0.9  --   --   --   HEPARINUNFRC  --   --   --   --   --   --   --  0.27*  CREATININE 0.78 0.93  --   --   --   --   --   --   TROPONINIHS  --  4   < > 632*  --  2,145* 1,865*  --    < > = values in this interval not displayed.     Estimated Creatinine Clearance: 73.8 mL/min (by C-G formula based on SCr of 0.93 mg/dL).   Medical History: Past Medical History:  Diagnosis Date   Anxiety    HX OF ANXIETY ATTACKS   Back pain, chronic    Cholelithiasis 04/15/2021   Diabetes ([place])    started Metformin in 02/2021   Fibromyalgia    GERD (gastroesophageal reflux disease)    Hypertension    Migraines    HX OF MIGRAINES   Obesity    Shortened PR interval     Medications:   Scheduled:   Infusions:   sodium chloride     heparin 1,000 Units/hr (04/15/21 1029)    Assessment: 34 yof arriving to ED via EMS for chest pain that woke patient from sleep. Heparin per pharmacy placed for ACS work-up.  Not on anticoagulation prior to arrival. D-dimer 0.78. CBC stable. PLT 258.  First heparin level this evening just slightly below goal range.  No overt bleeding or complications noted.  Goal of Therapy:  Heparin level 0.3-0.7 units/ml Monitor platelets by anticoagulation protocol: Yes    Plan:  Increase IV heparin to 1200 units/hr. Recheck heparin level in 8 hrs. Daily heparin level and CBC.  [place] Crane, Roylene Reason, BCCP Clinical Pharmacist  04/15/2021 7:11 PM   [facility] pharmacy phone numbers are listed on [email or web address]

## 2021-04-15 NOTE — ED Notes (Signed)
Attempted report 

## 2021-04-15 NOTE — Consult Note (Addendum)
Cardiology Consultation:   Patient ID: Lisa Casey MRN: [id number]; DOB: [birth year]  Admit date: 04/15/2021 Date of Consult: 04/15/2021  PCP:  [name], [place] Providers Cardiologist: New to Dr. Margaretann Loveless (saw Dr. Ellyn Hack in [phone])  Click here to update MD or APP on Care Team, Refresh:1}     Patient Profile:   Lisa Casey is a 60 y.o. female with a hx of obesity, anxiety, pre-DM, fibromyalgia, GERD, HTN, historically short PR interval, former brief tobacco use who is being seen 04/15/2021 for the evaluation of NSTEMI at the request of Dr. Lorin Mercy.  History of Present Illness:   Lisa Casey was remotely seen by Dr. Ellyn Hack in 2013 for chest pain. She had undergone stress testing with abnormal ST depressions but imaging was normal. She underwent more definitive cardiac cath 10/2011 showing no evidence of significant CAD. She had extremely tortuous left coronary system, codominant with large LPLV, EF 65-70%.  The last 2 weeks she has been struggling with frequent generalized headaches. Her husband had been trying to get her to get checked out but she is the mother of a 96 year old son with Down's syndrome so her first priority is usually caring for him. She has also noticed increased anxiety for the last few weeks. She recently got steroids/Toradol for sciatica issues. This AM, she woke up shortly before 4am with severe left sided chest pain associated with SOB, nausea, and sweating. She tried thumping on her chest with her hand but it gave to relief. Her husband summoned EMS shortly after. By EMS she received '324mg'$  ASA, '6mg'$  morphine, 0.'4mg'$  SL NTG, '4mg'$  Zofran, 550m NS which improved symptoms. She still had mild residual chest pain for about an hour after arrival but remains chest pain free. She now just complains of a headache. Labs show troponin 4->632, normal CBC, K 3.6, Cr 0.93, d-dimer 0.78, Covid negative, hCG negative. CT angio to r/o dissection showed normal aorta,  patent central pulmonary arteries, some calcified coronary atheroscerlosis, abnormal kidney suggesting medullary nephrocalcinosis and/or developing renal staghorn calculi also 7 mm calculus at the right ureteropelvic junction appears to be causing mild right hydronephrosis, cholelithasis w/o cholecystitis, streaky lung opacity in the lingula/LL (more c/w atx/scarring than infection), and small benign appearing RLL pulmonary nodule.   Past Medical History:  Diagnosis Date   Anxiety    HX OF ANXIETY ATTACKS   Back pain, chronic    Diabetes ([place]    started Metformin in 02/2021   Fibromyalgia    GERD (gastroesophageal reflux disease)    Hypertension    Migraines    HX OF MIGRAINES   Obesity    Shortened PR interval     Past Surgical History:  Procedure Laterality Date   CONSTIPATION     CYSTOSCOPY W/ URETERAL STENT PLACEMENT  09/23/2012   Procedure: CYSTOSCOPY WITH RETROGRADE PYELOGRAM/URETERAL STENT PLACEMENT;  Surgeon: TAlexis Frock MD;  Location: [facility];  Service: Urology;  Laterality: Right;   CYSTOSCOPY WITH RETROGRADE PYELOGRAM, URETEROSCOPY AND STENT PLACEMENT  10/08/2012   Procedure: CYSTOSCOPY WITH RETROGRADE PYELOGRAM, URETEROSCOPY AND STENT PLACEMENT;  Surgeon: TAlexis Frock MD;  [facility]  Service: Urology;  Laterality: Right;  90 MIN NO RETROGRADE NEEDS FLEX URETEROSCOPE    HOLMIUM LASER APPLICATION  [id number]  Procedure: HOLMIUM LASER APPLICATION;  Surgeon: TAlexis Frock MD;  [facility]  Service: Urology;  Laterality: Right;   LEFT HEART CATHETERIZATION WITH CORONARY ANGIOGRAM N/A 10/29/2011   Procedure: LEFT  HEART CATHETERIZATION WITH CORONARY ANGIOGRAM;  Surgeon: Leonie Man, MD;  Location: [facility];  Service: Cardiovascular;  Laterality: N/A;   ROBOTIC ASSISTED LAP VAGINAL HYSTERECTOMY  09/14/2011   Procedure: ROBOTIC ASSISTED LAPAROSCOPIC VAGINAL HYSTERECTOMY;  Surgeon: Agnes Lawrence, MD;   Location: [facility];  Service: Gynecology;  Laterality: N/A;   TUBAL LIGATION       Home Medications:  Prior to Admission medications   Medication Sig Start Date End Date Taking? Authorizing Provider  acetaminophen (TYLENOL) 500 MG tablet Take 1,000 mg by mouth every 6 (six) hours as needed for mild pain or headache.   Yes [provider]  albuterol (VENTOLIN HFA) 108 (90 Base) MCG/ACT inhaler Inhale 2 puffs into the lungs every 6 (six) hours as needed for wheezing or shortness of breath. 09/13/20  Yes [name], FNP  amLODipine (NORVASC) 2.5 MG tablet Take 1 tablet by mouth once daily 03/13/21  Yes [name], FNP  azelastine (ASTELIN) 0.1 % nasal spray Place 2 sprays into both nostrils 2 (two) times daily. Use in each nostril as directed Patient taking differently: Place 2 sprays into both nostrils 2 (two) times daily as needed for rhinitis or allergies. 01/02/21  Yes [name], FNP  cyclobenzaprine (FLEXERIL) 5 MG tablet Take 1 tablet by mouth three times daily as needed for muscle spasm 03/13/21  Yes [name], FNP  fluticasone (FLONASE) 50 MCG/ACT nasal spray Place 2 sprays into both nostrils daily. Patient taking differently: Place 2 sprays into both nostrils daily as needed for allergies or rhinitis. 11/05/18  Yes [place], [place], PA-C  hydrOXYzine (VISTARIL) 25 MG capsule TAKE 1 CAPSULE BY MOUTH EVERY 6 HOURS AS NEEDED Patient taking differently: Take 25 mg by mouth every 6 (six) hours as needed for anxiety. 03/13/21  Yes [name], FNP  levocetirizine (XYZAL) 5 MG tablet Take 1 tablet (5 mg total) by mouth every evening. Patient taking differently: Take 5 mg by mouth at bedtime as needed for allergies. 01/02/21  Yes [name], FNP  lisdexamfetamine (VYVANSE) 20 MG capsule Take 1 capsule (20 mg total) by mouth daily. 03/09/21  Yes [name], FNP  metFORMIN (GLUCOPHAGE) 500 MG tablet Take 1 tablet (500 mg total) by mouth daily. 03/09/21 03/09/22 Yes [name],  FNP  neomycin-polymyxin b-dexamethasone (MAXITROL) 3.5-10000-0.1 OINT Place 1 application into both eyes at bedtime as needed (eye irritation). 03/02/21  Yes [provider]  PARoxetine (PAXIL) 20 MG tablet Take 1 tablet by mouth once daily Patient taking differently: Take 20 mg by mouth daily. 12/05/20  Yes [name], FNP  pregabalin (LYRICA) 150 MG capsule Take 1 capsule (150 mg total) by mouth 2 (two) times daily. Patient taking differently: Take 150 mg by mouth 2 (two) times daily as needed (pain). 10/07/20  Yes [name], FNP  Probiotic Product (PROBIOTIC PO) Take 1 capsule by mouth daily.   Yes [provider]  RESTASIS 0.05 % ophthalmic emulsion Place 1 drop into both eyes in the morning and at bedtime. 04/14/21  Yes [provider]    Inpatient Medications: Scheduled Meds:  [START ON 04/16/2021] aspirin EC  81 mg Oral Daily   atorvastatin  40 mg Oral q1800   cycloSPORINE  1 drop Both Eyes BID   insulin aspart  0-15 Units Subcutaneous TID WC   insulin aspart  0-5 Units Subcutaneous QHS   PARoxetine  20 mg Oral Daily   potassium chloride  40 mEq Oral Once   sodium chloride flush  3  mL Intravenous Q12H   Continuous Infusions:  sodium chloride     heparin 1,000 Units/hr (04/15/21 1029)   PRN Meds: sodium chloride, acetaminophen, albuterol, cyclobenzaprine, hydrOXYzine, neomycin-polymyxin b-dexamethasone, nitroGLYCERIN, ondansetron (ZOFRAN) IV, pregabalin, sodium chloride flush  Allergies:    Allergies  Allergen Reactions   Codeine Itching    With rash    Social History:   Social History   Socioeconomic History   Marital status: Legally Separated    Spouse name: Not on file   Number of children: Not on file   Years of education: Not on file   Highest education level: Not on file  Occupational History   Occupation: school bus driver  Tobacco Use   Smoking status: Former    Packs/day: 0.25    Years: 10.00    Pack years: 2.50    Types:  Cigarettes   Smokeless tobacco: Never  Substance and Sexual Activity   Alcohol use: No   Drug use: No   Sexual activity: Yes    Birth control/protection: Other-see comments    Comment: hysterectomy  Other Topics Concern   Not on file  Social History Narrative   Not on file   Social Determinants of Health   Financial Resource Strain: Not on file  Food Insecurity: Not on file  Transportation Needs: Not on file  Physical Activity: Not on file  Stress: Not on file  Social Connections: Not on file  Intimate Partner Violence: Not on file    Family History:   Family History  Problem Relation Age of Onset   Breast cancer Mother 35   Breast cancer Paternal Grandmother    Heart failure Neg Hx    Coronary artery disease Neg Hx      ROS:  Please see the history of present illness.  All other ROS reviewed and negative.     Physical Exam/Data:   Vitals:   04/15/21 1130 04/15/21 1200 04/15/21 1300 04/15/21 1315  BP: (!) 142/73 138/87 136/74 (!) 144/82  Pulse: 71 65 63 65  Resp: '16  17 18  '$ Temp:      TempSrc:      SpO2: 95% 94% 91% 91%  Weight:      Height:       No intake or output data in the 24 hours ending 04/15/21 1345 Last 3 Weights 04/15/2021 04/13/2021 03/09/2021  Weight (lbs) 212 lb 212 lb 212 lb 6.4 oz  Weight (kg) 96.163 kg 96.163 kg 96.344 kg     Body mass index is 35.28 kg/m.  General: Well developed, well nourished AAF in no acute distress. Head: Normocephalic, atraumatic, sclera non-icteric, no xanthomas, nares are without discharge. Neck: Negative for carotid bruits. JVP not elevated. Lungs: Clear bilaterally to auscultation without wheezes, rales, or rhonchi. Breathing is unlabored. Heart: RRR S1 S2 without murmurs, rubs, or gallops.  Abdomen: Soft, non-tender, non-distended with normoactive bowel sounds. No rebound/guarding. Extremities: No clubbing or cyanosis. No edema. Distal pedal pulses are 2+ and equal bilaterally. Neuro: Alert and oriented X 3.  Moves all extremities spontaneously. Psych:  Responds to questions appropriately with a normal affect.   EKG:  The EKG was personally reviewed and demonstrates:   First: NSR 75bpm, borderline short PR interval, baseline wander, nonspecific TW changes Second: NSR 68pm with borderline short PR interval, nonspecific TW changes, QT interval possibly prolonged - difficult to ascertain where the end of true T wave lies.  Telemetry:  Telemetry was personally reviewed and demonstrates: NSR, rare PAC   LHC  10/2011 PATIENT:  ABRYANNA VANDEWIELE  61 y.o. female with a history of exertional chest pain and abnormal treadmill portion of her stress test. She had normal myocardial imaging however CV no chest discomfort after hysterectomy. Due to the abnormal treadmill portion of stress test, it was decided to proceed with diagnostic catheterization to delineate her anatomy.   PRE-OPERATIVE DIAGNOSIS:  abnormal cardiolite/chest pain   POST-OPERATIVE DIAGNOSIS:  No evidence of bony artery disease, normal ejection fraction no wall motion abnormalities.  Extremely tortuous Left Coronary system; codominant with large LPLV. Hemodynamics: Left ventricular pressure: 113/6 mmHg: 9 mm Hg LVEDP; central aortic pressure: 117/73 mmHg; 93 mm Hg mean. LV gram: Hyperdynamic left ventricle EF is 65-70%; no wall motion abnormalities. Left main is a long, large caliber vessel it trifurcates into a LAD, Ramus Intermedius, Circumflex, angiographically normal. LAD the large caliber tortuous vessel that reaches down to none around the apex. There is 2 large septal perforators and 2 small diagonal branches. The rest of the vessel reaches down around the apex witness and disease. The Circumflex is obese the same diameter as the LAD is also very tortuous the possible portion there is a very small proximal OMB with a small AV nodal artery. The vessel since it terminates into a large posterior lateral branch that gives off one inferior branch.  Mr. disease in this vessel reaches down around the apex. Ramus intermedius is just slightly smaller than the circumflex is also very tortuous and courses in a distribution that runs in a high OM/diagonal distribution. Angiographically normal. The RCA is a codominant vessel gives rise to PDA and a small AV nodal artery but no significant RPL branch; no evidence radiographically significant disease in this downward takeoff vessel.   PROCEDURE:  Procedure(s): LEFT HEART CATHETERIZATION WITH CORONARY ANGIOGRAM   Surgeon(s): Leonie Man, MD   ANESTHESIA:   local and IV sedation; 1 mg Versed, 20 mcg fentanyl. Oral Valium 5 mg. Medications: Radial Cocktail: 5 mg Verapamil, 400 mcg NTG, 2 ml 2% Lidocaine -- 10 mL IV Heparin: 3500 Units Intracoronary nitroglycerin: 200 mcg Omnipaque contrast: 90 mL LOCAL MEDICATIONS USED:  LIDOCAINE to mL   EBL:   < 5 mL   Equipment: 5 French Right Radial Glide Sheath; 5 [place] JR 4, 5 [place] JL 3.5, 5 [place] Angled Pigtai   TOURNIQUET:   TR band -- 10 mL air; 1409 hours   DICTATION: .Note written in EPIC   PLAN OF CARE: Discharge to home after PACU   PATIENT DISPOSITION:  PACU - hemodynamically stable.   Delay start of Pharmacological VTE agent (>24hrs) due to surgical blood loss or risk of bleeding: not applicable    Laboratory Data:  High Sensitivity Troponin:   Recent Labs  Lab 04/15/21 0511 04/15/21 0806  TROPONINIHS 4 632*     Chemistry Recent Labs  Lab 04/13/21 1018 04/15/21 0511  NA 141 137  K 4.3 3.6  CL 104 106  CO2 23 24  GLUCOSE 106* 156*  BUN 14 18  CREATININE 0.78 0.93  CALCIUM 9.4 8.7*  GFRNONAA  --  >60  ANIONGAP  --  7    Recent Labs  Lab 04/13/21 1018  PROT 6.9  ALBUMIN 4.3  AST 17  ALT 15  ALKPHOS 84  BILITOT 0.7   Hematology Recent Labs  Lab 04/15/21 0511  WBC 7.4  RBC 4.20  HGB 12.8  HCT 39.4  MCV 93.8  MCH 30.5  MCHC 32.5  RDW 11.7  PLT 258  BNPNo results for input(s): BNP, PROBNP in  the last 168 hours.  DDimer  Recent Labs  Lab 04/15/21 0511  DDIMER 0.78*     Radiology/Studies:  DG Chest Port 1 View  Result Date: 04/15/2021 CLINICAL DATA:  60 year old female with chest pain. EXAM: PORTABLE CHEST 1 VIEW COMPARISON:  Chest radiographs 09/13/2020 and earlier. FINDINGS: Portable AP semi upright view at 0552 hours. Lower lung volumes. Chronic lingula scarring. Increased patchy bibasilar opacity most resembling atelectasis. Mediastinal contours are stable and within normal limits. Chronic thoracic scoliosis. Visualized tracheal air column is within normal limits. No pneumothorax, pulmonary edema, pleural effusion or consolidation. Paucity of bowel gas. No acute osseous abnormality identified. IMPRESSION: Lower lung volumes with bibasilar atelectasis.  Scoliosis. Electronically Signed   By: Genevie Ann M.D.   On: 04/15/2021 06:25   CT Angio Chest/Abd/Pel for Dissection W and/or Wo Contrast  Result Date: 04/15/2021 CLINICAL DATA:  60 year old female with central chest and abdominal pain which woke her from sleep. Chest pain and arm numbness. EXAM: CT ANGIOGRAPHY CHEST, ABDOMEN AND PELVIS TECHNIQUE: Non-contrast CT of the chest was initially obtained. Multidetector CT imaging through the chest, abdomen and pelvis was performed using the standard protocol during bolus administration of intravenous contrast. Multiplanar reconstructed images and MIPs were obtained and reviewed to evaluate the vascular anatomy. CONTRAST:  142m OMNIPAQUE IOHEXOL 350 MG/ML SOLN COMPARISON:  CT Abdomen and Pelvis 09/21/2012. Portable chest radiograph earlier today. FINDINGS: CTA CHEST FINDINGS Cardiovascular: Normal caliber thoracic aorta. Negative for aortic dissection. Cardiac size within normal limits. No pericardial effusion. Central and hilar pulmonary arteries also well contrast enhanced and appear to be patent. Some calcified coronary artery atherosclerosis is evident. Mediastinum/Nodes: Negative. No  mediastinal mass or lymphadenopathy. Lungs/Pleura: Atelectatic changes to the major airways which remain patent. Platelike and streaky bilateral lower lobe and lingula opacity more resembles atelectasis than infection. A small right lower lobe lung nodule on series 8, image 68 was present in 2014 and not significantly changed compatible with benign etiology. Similar tiny subpleural left upper lobe nodule on series 8, image 51 is likely postinflammatory. No pleural effusion or consolidation. Musculoskeletal: Mild to moderate scoliosis. No acute osseous abnormality identified. Review of the MIP images confirms the above findings. CTA ABDOMEN AND PELVIS FINDINGS VASCULAR Abdominal aorta is normal. Major aortic branches are patent. Bilateral iliac arteries and visible proximal femoral arteries are normal. Review of the MIP images confirms the above findings. NON-VASCULAR Hepatobiliary: Small gallstones in the neck of the gallbladder. No pericholecystic inflammation. Negative liver. Pancreas: Negative. Spleen: Negative. Adrenals/Urinary Tract: Normal adrenal glands. Best seen on precontrast series 5 there is extensive renal collecting system calcification suggesting medullary nephrocalcinosis and/or developing staghorn renal calculi. There is a 7 mm stone at the right ureteropelvic junction. Mild right hydronephrosis. No perinephric inflammatory stranding. Distal to the stone the right ureter appears decompressed. The left ureter is decompressed, but with indeterminate density throughout the lumen of the left ureter. No excreted IV contrast in the urinary bladder. The bladder is mildly distended but otherwise appears negative. Stomach/Bowel: Mild gas distended sigmoid. Mild retained stool and redundancy of the large bowel. Mild diverticulosis at the hepatic flexure. No active inflammation. Normal appendix series 7, image 204. Negative terminal ileum. No dilated small bowel. Negative stomach and duodenum. No free air,  free fluid, mesenteric inflammation. Lymphatic: No lymphadenopathy. Reproductive: Absent uterus.  Diminutive or absent ovaries. Other: No pelvic free fluid. Musculoskeletal: Chronic degenerative S1 superior endplate sclerosis. No acute osseous abnormality identified. Review  of the MIP images confirms the above findings. IMPRESSION: 1. Normal aorta. Central pulmonary arteries appear patent. Some calcified coronary artery atherosclerosis. 2. Abnormal kidney suggesting medullary nephrocalcinosis and/or developing renal staghorn calculi. And a 7 mm calculus at the right ureteropelvic junction appears to be causing mild Right Hydronephrosis. 3. Cholelithiasis but no CT evidence of acute cholecystitis. 4. Streaky lung opacity in the lingula and both lower lobes more resembles atelectasis or scarring than infection. No pleural effusion. 5. Small benign right lower lobe pulmonary nodule. There is a 2nd tiny left upper lobe nodule on series 8, image 51. No follow-up needed if patient is low-risk. Non-contrast chest CT can be considered in 12 months if patient is high-risk. This recommendation follows the consensus statement: Guidelines for Management of Incidental Pulmonary Nodules Detected on CT Images: From the Fleischner Society 2017; Radiology 2017; 284:228-243. Electronically Signed   By: Genevie Ann M.D.   On: 04/15/2021 08:22     Assessment and Plan:   1. Chest pain/NSTEMI - hsTroponin 4->632, presentation seems consistent with ACS, subsequent trending pending - CTA as above r/o dissection and PE - agree with ASA, statin, IV heparin - start very low dose BB (HR low-mid 60s) - await echocardiogram - await lipid panel - she is now pain free so anticipate cardiac cath on Monday, sooner if she develops refractory pain or develops any signs of instability - have written on add on board and wrote pre-cath orders with usual fluids (if any development of CHF this weekend, these will need to be amended)  Shared  Decision Making/Informed Consent The risks [stroke (1 in 1000), death (1 in 1000), kidney failure [usually temporary] (1 in 500), bleeding (1 in 200), allergic reaction [possibly serious] (1 in 200)], benefits (diagnostic support and management of coronary artery disease) and alternatives of a cardiac catheterization were discussed in detail with Lisa Casey and she is willing to proceed.  2. Possible QT prolongation - 2nd EKG raises question of QT prolongation but it is challenging to discern the end of the T wave - looking at lead III, this is hand-calculated at 41m but will need to follow with serial EKGs - magnesium pending - give KCl 431m x1 given K 3.6 - recommend to keep 4.0 or greater - avoid QT prolonging agents  3. Recent headaches - etiology unclear, will defer to IM (Dr. YaLorin Mercys aware) - question whether imaging would be warranted given plan for further cardiac workup - timing potentially correlates with metformin initiation but not totally clear  4. Essential HTN - BP reasonable, follow with metoprolol  5.  Pre-diabetes - last A1C 6.4 on 04/13/21, recently started on metformin - hold metformin for cath  6. Ancillary findings on CT scan - nephrolithiasis, cholelithiasis, pulm nodule - will defer further to primary team, including timing of f/u of pulm nodule if needed  7. Short PR interval - historical finding for patient - follow on telemetry - continue OP f/u   Risk Assessment/Risk Scores:     TIMI Risk Score for Unstable Angina or Non-ST Elevation MI:   The patient's TIMI risk score is 2, which indicates a 8% risk of all cause mortality, new or recurrent myocardial infarction or need for urgent revascularization in the next 14 days.     For questions or updates, please contact [facility] consult [email or web address] for contact info under    Signed, DaCharlie PitterPA-C  04/15/2021 1:45  PM  ---------------------------------------------------------------------------------------------   History and all data  above reviewed.  Patient examined.  I agree with the findings as above.  Lisa Casey is a 60 yo female with prediabetes, GERD, HTN.  Previously underwent cardiac catheterization in 2013 with no obstructive CAD and extremely tortuous left coronary system with codominant circulation.  I am unable to independently review the images from this cardiac catheterization.  She presents with generalized headaches for the last 2 weeks given significant stress in her life and this morning woke up at 4 AM with severe left-sided chest pain associated with shortness of breath, nausea, and diaphoresis.  She has now been noted to have a troponin elevation to 632 from normal and has an ECG demonstrating evolving anterior T wave inversions.  Repeat ECG is now pending.  Echocardiogram being performed as I exited the room.  I have independently reviewed the CT angiography of the chest that was performed to exclude pulmonary embolism which shows scant and scattered coronary artery calcifications, tortuous coronary arteries, grossly normal biventricular size.  No definite perfusion defects in the myocardium noted.  Constitutional: No acute distress Eyes: pupils equally round and reactive to light, sclera non-icteric, normal conjunctiva and lids ENMT: normal dentition, moist mucous membranes Cardiovascular: regular rhythm, normal rate, no murmurs. S1 and S2 normal. Radial pulses normal bilaterally. No jugular venous distention.  Respiratory: clear to auscultation bilaterally GI : normal bowel sounds, soft and nontender. No distention.   MSK: extremities warm, well perfused. No edema.  NEURO: grossly nonfocal exam, moves all extremities. PSYCH: alert and oriented x 3, normal mood and affect.   All available labs, radiology testing, previous records reviewed. Agree with documented assessment and  plan of my colleague as stated above with the following additions or changes:  Principal Problem:   NSTEMI (non-ST elevated myocardial infarction) ([place]) Active Problems:   Essential hypertension   Obesity (BMI 30-39.9)   Prediabetes   Fibromyalgia   Headache   Cholelithiasis   Anxiety    Plan: NSTEMI -Repeat EKG now to assess for evolving ECG changes.  Fortunately she is chest pain-free. -Continue aspirin, statin, IV heparin. -Plan for cardiac catheterization on Monday unless she develops unrelenting chest pain or worrisome dynamic ECG changes.  INFORMED CONSENT: I have reviewed the risks, indications, and alternatives to cardiac catheterization, possible angioplasty, and stenting with the patient. Risks include but are not limited to bleeding, infection, vascular injury, stroke, myocardial infarction, arrhythmia, kidney injury, radiation-related injury in the case of prolonged fluoroscopy use, emergency cardiac surgery, and death. The patient understands the risks of serious complication is 1-2 in [id number] with diagnostic cardiac cath and 1-2% or less with angioplasty/stenting.     Length of Stay:  LOS: 0 days   Elouise Munroe, MD [facility] 3:30 PM  04/15/2021

## 2021-04-15 NOTE — ED Notes (Signed)
Critical troponin of 632 paged to admitting provider Dr Lorin Mercy. Cardiology consult pending

## 2021-04-15 NOTE — ED Triage Notes (Addendum)
Pt BIB EMS from home for centralized non-radiating chest pain that woke pt from sleep. NSR with occasional PACs noted on EMS EKG. Endorsed nausea and SOB. Pt now stating that she has 10/10 Left-sided chest pain and numbness in both arms  EMS medications given: '324mg'$  aspirin '6mg'$  morphine 0.04 nitro sublingual '4mg'$  zofran  571m normal saline  EMS vitals: 124/83 Pulse 75 RR WDL 99% room air

## 2021-04-15 NOTE — ED Notes (Signed)
Patient transported to MRI 

## 2021-04-15 NOTE — H&P (Signed)
History and Physical    Lisa Casey [id number] DOB: 1961/09/12 DOA: 04/15/2021  PCP: [name], FNP Consultants:  [facility] Serio - orthopedics; [facility] - rheumatology; Ellyn Hack - cardiology Patient coming from:  Home - lives with son (has Down syndrome); NOK: [place], [phone]  Chief Complaint: Chest pain  HPI: Lisa Casey is a 60 y.o. female with medical history significant of fibromyalgia; anxiety; chronic back pain; and HTN presenting with chest pain. She reports that she felt anxious throughout the day yesterday  - has anxiety so thought that was it.  She went to bed about 11 and woke up a few hours later with acute left-sided chest pain.  It was so bad she felt like she was having a heart attack.  She was getting dressed but noticed diaphoresis and SOB and so they called 911.  The pain radiated into both arms with B tingling, limpness.  The pain resolved with NTG and ASA.  It came back off and on in the ambulance and it resolved and hasn't returned.  She was having difficulty with sciatica and was given steroid and Toradol injections (7/28).   She also reports a recent stabbing headache that has been periodic but persistent for the last few weeks.    ED Course: Chest pain, delta troponin in process.  Heart score 5.  CP, radiation to the L arm, felt numbness.  Given ASA and NTG with resolution of the pain.  T wave inversions on EKG.  Troponin 4.  CTA for dissection done after desat to 86%, incidental findings but nothing impressive.  Now CP free.  Last stress test was in 2012.  Review of Systems: As per HPI; otherwise review of systems reviewed and negative.   Ambulatory Status:  Ambulates without assistance  COVID Vaccine Status:   Complete plus 1 booster  Past Medical History:  Diagnosis Date   Anxiety    HX OF ANXIETY ATTACKS   Back pain, chronic    Cholelithiasis 04/15/2021   Diabetes ([place])    started Metformin in 02/2021   Fibromyalgia    GERD (gastroesophageal reflux  disease)    Hypertension    Migraines    HX OF MIGRAINES   Obesity    Shortened PR interval     Past Surgical History:  Procedure Laterality Date   CONSTIPATION     CYSTOSCOPY W/ URETERAL STENT PLACEMENT  09/23/2012   Procedure: CYSTOSCOPY WITH RETROGRADE PYELOGRAM/URETERAL STENT PLACEMENT;  Surgeon: Alexis Frock, MD;  Location: [facility];  Service: Urology;  Laterality: Right;   CYSTOSCOPY WITH RETROGRADE PYELOGRAM, URETEROSCOPY AND STENT PLACEMENT  10/08/2012   Procedure: CYSTOSCOPY WITH RETROGRADE PYELOGRAM, URETEROSCOPY AND STENT PLACEMENT;  Surgeon: Alexis Frock, MD;  Location: [facility];  Service: Urology;  Laterality: Right;  90 MIN NO RETROGRADE NEEDS FLEX URETEROSCOPE    HOLMIUM LASER APPLICATION  [id number]   Procedure: HOLMIUM LASER APPLICATION;  Surgeon: Alexis Frock, MD;  Location: [facility];  Service: Urology;  Laterality: Right;   LEFT HEART CATHETERIZATION WITH CORONARY ANGIOGRAM N/A 10/29/2011   Procedure: LEFT HEART CATHETERIZATION WITH CORONARY ANGIOGRAM;  Surgeon: Leonie Man, MD;  Location: [facility];  Service: Cardiovascular;  Laterality: N/A;   ROBOTIC ASSISTED LAP VAGINAL HYSTERECTOMY  09/14/2011   Procedure: ROBOTIC ASSISTED LAPAROSCOPIC VAGINAL HYSTERECTOMY;  Surgeon: Agnes Lawrence, MD;  Location: [facility];  Service: Gynecology;  Laterality: N/A;   TUBAL LIGATION      Social History   Socioeconomic History   Marital  status: Legally Separated    Spouse name: Not on file   Number of children: Not on file   Years of education: Not on file   Highest education level: Not on file  Occupational History   Occupation: school bus driver  Tobacco Use   Smoking status: Former    Packs/day: 0.25    Years: 10.00    Pack years: 2.50    Types: Cigarettes   Smokeless tobacco: Never  Substance and Sexual Activity   Alcohol use: No   Drug use: No   Sexual activity: Yes    Birth control/protection: Other-see comments     Comment: hysterectomy  Other Topics Concern   Not on file  Social History Narrative   Not on file   Social Determinants of Health   Financial Resource Strain: Not on file  Food Insecurity: Not on file  Transportation Needs: Not on file  Physical Activity: Not on file  Stress: Not on file  Social Connections: Not on file  Intimate Partner Violence: Not on file    Allergies  Allergen Reactions   Codeine Itching    With rash    Family History  Problem Relation Age of Onset   Breast cancer Mother 28   Breast cancer Paternal Grandmother    Heart failure Neg Hx    Coronary artery disease Neg Hx     Prior to Admission medications   Medication Sig Start Date End Date Taking? Authorizing Provider  albuterol (VENTOLIN HFA) 108 (90 Base) MCG/ACT inhaler Inhale 2 puffs into the lungs every 6 (six) hours as needed for wheezing or shortness of breath. 09/13/20   [name], FNP  amLODipine (NORVASC) 2.5 MG tablet Take 1 tablet by mouth once daily 03/13/21   [name], FNP  azelastine (ASTELIN) 0.1 % nasal spray Place 2 sprays into both nostrils 2 (two) times daily. Use in each nostril as directed 01/02/21   [name], FNP  benzonatate (TESSALON PERLES) 100 MG capsule Take 1 capsule (100 mg total) by mouth 3 (three) times daily as needed for cough. Patient not taking: Reported on 03/09/2021 01/05/21 01/05/22  Bary Castilla, NP  cephALEXin (KEFLEX) 500 MG capsule Take 500 mg by mouth 2 (two) times daily. Patient not taking: Reported on 04/13/2021 03/02/21   [provider]  cyclobenzaprine (FLEXERIL) 5 MG tablet Take 1 tablet by mouth three times daily as needed for muscle spasm 03/13/21   [name], FNP  doxycycline (VIBRA-TABS) 100 MG tablet Take 100 mg by mouth 2 (two) times daily. 03/02/21   [provider]  fluconazole (DIFLUCAN) 150 MG tablet Take 150 mg by mouth once. Patient not taking: Reported on 04/13/2021 03/02/21   [provider]   fluticasone (FLONASE) 50 MCG/ACT nasal spray Place 2 sprays into both nostrils daily. 11/05/18   [place], [place], PA-C  hydrOXYzine (VISTARIL) 25 MG capsule TAKE 1 CAPSULE BY MOUTH EVERY 6 HOURS AS NEEDED 03/13/21   [name], FNP  levocetirizine (XYZAL) 5 MG tablet Take 1 tablet (5 mg total) by mouth every evening. 01/02/21   [name], FNP  lisdexamfetamine (VYVANSE) 20 MG capsule Take 1 capsule (20 mg total) by mouth daily. 03/09/21   [name], FNP  metFORMIN (GLUCOPHAGE) 500 MG tablet Take 1 tablet (500 mg total) by mouth daily. 03/09/21 03/09/22  [name], FNP  neomycin-polymyxin b-dexamethasone (MAXITROL) 3.5-10000-0.1 OINT  03/02/21   [provider]  PARoxetine (PAXIL) 20 MG tablet Take 1 tablet by mouth once daily 12/05/20  [name], FNP  pregabalin (LYRICA) 150 MG capsule Take 1 capsule (150 mg total) by mouth 2 (two) times daily. 10/07/20   [name], FNP    Physical Exam: Vitals:   04/15/21 1345 04/15/21 1400 04/15/21 1415 04/15/21 1430  BP: 140/74 128/70 125/81 (!) 145/89  Pulse: 65 73 65 65  Resp:   16 18  Temp:      TempSrc:      SpO2: 92% 91% 91% 95%  Weight:      Height:         General:  Appears calm and comfortable and is in NAD Eyes:  PERRL, EOMI, normal lids, iris ENT:  grossly normal hearing, lips & tongue, mmm; appropriate dentition Neck:  no LAD, masses or thyromegaly Cardiovascular:  RRR, no m/r/g. No LE edema.  Respiratory:   CTA bilaterally with no wheezes/rales/rhonchi.  Normal respiratory effort. Abdomen:  soft, NT, ND Skin:  no rash or induration seen on limited exam Musculoskeletal:  grossly normal tone BUE/BLE, good ROM, no bony abnormality Psychiatric:  grossly normal mood and affect, speech fluent and appropriate, AOx3 Neurologic:  CN 2-12 grossly intact, moves all extremities in coordinated fashion,    Radiological Exams on Admission: Independently reviewed - see discussion in A/P where applicable  DG Chest Port  1 View  Result Date: 04/15/2021 CLINICAL DATA:  60 year old female with chest pain. EXAM: PORTABLE CHEST 1 VIEW COMPARISON:  Chest radiographs 09/13/2020 and earlier. FINDINGS: Portable AP semi upright view at 0552 hours. Lower lung volumes. Chronic lingula scarring. Increased patchy bibasilar opacity most resembling atelectasis. Mediastinal contours are stable and within normal limits. Chronic thoracic scoliosis. Visualized tracheal air column is within normal limits. No pneumothorax, pulmonary edema, pleural effusion or consolidation. Paucity of bowel gas. No acute osseous abnormality identified. IMPRESSION: Lower lung volumes with bibasilar atelectasis.  Scoliosis. Electronically Signed   By: Genevie Ann M.D.   On: 04/15/2021 06:25   CT Angio Chest/Abd/Pel for Dissection W and/or Wo Contrast  Result Date: 04/15/2021 CLINICAL DATA:  60 year old female with central chest and abdominal pain which woke her from sleep. Chest pain and arm numbness. EXAM: CT ANGIOGRAPHY CHEST, ABDOMEN AND PELVIS TECHNIQUE: Non-contrast CT of the chest was initially obtained. Multidetector CT imaging through the chest, abdomen and pelvis was performed using the standard protocol during bolus administration of intravenous contrast. Multiplanar reconstructed images and MIPs were obtained and reviewed to evaluate the vascular anatomy. CONTRAST:  117m OMNIPAQUE IOHEXOL 350 MG/ML SOLN COMPARISON:  CT Abdomen and Pelvis 09/21/2012. Portable chest radiograph earlier today. FINDINGS: CTA CHEST FINDINGS Cardiovascular: Normal caliber thoracic aorta. Negative for aortic dissection. Cardiac size within normal limits. No pericardial effusion. Central and hilar pulmonary arteries also well contrast enhanced and appear to be patent. Some calcified coronary artery atherosclerosis is evident. Mediastinum/Nodes: Negative. No mediastinal mass or lymphadenopathy. Lungs/Pleura: Atelectatic changes to the major airways which remain patent. Platelike and  streaky bilateral lower lobe and lingula opacity more resembles atelectasis than infection. A small right lower lobe lung nodule on series 8, image 68 was present in 2014 and not significantly changed compatible with benign etiology. Similar tiny subpleural left upper lobe nodule on series 8, image 51 is likely postinflammatory. No pleural effusion or consolidation. Musculoskeletal: Mild to moderate scoliosis. No acute osseous abnormality identified. Review of the MIP images confirms the above findings. CTA ABDOMEN AND PELVIS FINDINGS VASCULAR Abdominal aorta is normal. Major aortic branches are patent. Bilateral iliac arteries and visible proximal femoral arteries are normal. Review  of the MIP images confirms the above findings. NON-VASCULAR Hepatobiliary: Small gallstones in the neck of the gallbladder. No pericholecystic inflammation. Negative liver. Pancreas: Negative. Spleen: Negative. Adrenals/Urinary Tract: Normal adrenal glands. Best seen on precontrast series 5 there is extensive renal collecting system calcification suggesting medullary nephrocalcinosis and/or developing staghorn renal calculi. There is a 7 mm stone at the right ureteropelvic junction. Mild right hydronephrosis. No perinephric inflammatory stranding. Distal to the stone the right ureter appears decompressed. The left ureter is decompressed, but with indeterminate density throughout the lumen of the left ureter. No excreted IV contrast in the urinary bladder. The bladder is mildly distended but otherwise appears negative. Stomach/Bowel: Mild gas distended sigmoid. Mild retained stool and redundancy of the large bowel. Mild diverticulosis at the hepatic flexure. No active inflammation. Normal appendix series 7, image 204. Negative terminal ileum. No dilated small bowel. Negative stomach and duodenum. No free air, free fluid, mesenteric inflammation. Lymphatic: No lymphadenopathy. Reproductive: Absent uterus.  Diminutive or absent ovaries.  Other: No pelvic free fluid. Musculoskeletal: Chronic degenerative S1 superior endplate sclerosis. No acute osseous abnormality identified. Review of the MIP images confirms the above findings. IMPRESSION: 1. Normal aorta. Central pulmonary arteries appear patent. Some calcified coronary artery atherosclerosis. 2. Abnormal kidney suggesting medullary nephrocalcinosis and/or developing renal staghorn calculi. And a 7 mm calculus at the right ureteropelvic junction appears to be causing mild Right Hydronephrosis. 3. Cholelithiasis but no CT evidence of acute cholecystitis. 4. Streaky lung opacity in the lingula and both lower lobes more resembles atelectasis or scarring than infection. No pleural effusion. 5. Small benign right lower lobe pulmonary nodule. There is a 2nd tiny left upper lobe nodule on series 8, image 51. No follow-up needed if patient is low-risk. Non-contrast chest CT can be considered in 12 months if patient is high-risk. This recommendation follows the consensus statement: Guidelines for Management of Incidental Pulmonary Nodules Detected on CT Images: From the Fleischner Society 2017; Radiology 2017; 284:228-243. Electronically Signed   By: Genevie Ann M.D.   On: 04/15/2021 08:22    EKG: Independently reviewed.   0448 - NSR with rate 65; nonspecific ST changes with no evidence of acute ischemia 0515- NSR with rate 68; prolonged QTc 570; nonspecific ST changes with no evidence of acute ischemia   Labs on Admission: I have personally reviewed the available labs and imaging studies at the time of the admission.  Pertinent labs:   Glucose 156 HS troponin 4; 632 Normal CBC D-dimer 0.78 HCG <5   Assessment/Plan Principal Problem:   NSTEMI (non-ST elevated myocardial infarction) ([place]) Active Problems:   Essential hypertension   Obesity (BMI 30-39.9)   Prediabetes   Fibromyalgia   Headache   Cholelithiasis   Anxiety   NSTEMI -Patient with left-sided chest pain that came on  acutely with sleep and improved and then resolved with NTG. -2/3 features suggestive of atypical angina.  -Possibly related to steroids/Toradol injection provided by PCP on 7/28 -CXR unremarkable.  Some CAD appreciated on CTA. -Initial HS troponin negative; repeat with markedly positive delta - suggestive of NSTEMI.   -EKG with apparent nonspecific ST depression of uncertain duration, no apparent STEMI. -Will admit since the patient has positive troponins and/or an abnormal EKG with angina necessitating acute intervention. -Risk factor stratification with FLP; will also check TSH and UDS -Cardiology consultation requested -Patient appears likely to need invasive evaluation (cardiac catheterization) based on concerning history, elevated troponin  -Start ASA 81 mg daily -NTG for symptom relief (although there  is no mortality benefit) -Needs beta blocker  -Will plan to start Heparin drip  -Supplemental O2 prn -Start Lipitor 40 mg qhs for now and check lipid panel  HTN -Continue beta-blocker, as above -Will also add prn hydralazine  Fibromyalgia/Chronic pain -Continue Lyrica  DM -Last A1c was 6.4 -Hold Glucophage -Will cover with moderate-scale SSI for now  Anxiety -Continue Paxil, prn Vistaril -Hold ADHD medication - she will not be performing tasks that require attention and focus as an inpatient   Headache -While unlikely to be serious, will obtain MRI/MRA to r/o aneurysm since the patient appears likely to need DAPT  -Consult neurology if MRI is abnormal  Abnormal R kidney -Seen incidentally on imaging with mild hydronephrosis -Recommend outpatient urology f/u unless new symptoms arise  Cholelithiasis -Incidentally seen on CT -F/u as needed  Pulmonary nodule -Seen incidentally on imaging -She is low risk so no f/u is needed  Obesity -Body mass index is 35.28 kg/m..  -Weight loss should be encouraged -Outpatient PCP/bariatric medicine/bariatric surgery f/u  encouraged      DVT prophylaxis: Heparin drip Code Status:  Full - confirmed with patient Family Communication: None present Disposition Plan:  The patient is from: home  Anticipated d/c is to: home without [facility] services once her cardiology issues have been resolved.  Anticipated d/c date will depend on clinical response to treatment, likely sometime after cardiac cath on Monday  Patient is currently: acutely ill Consults called: Cardiology; Neurology by telephone only  Admission status: Admit - It is my clinical opinion that admission to INPATIENT is reasonable and necessary because of the expectation that this patient will require hospital care that crosses at least 2 midnights to treat this condition based on the medical complexity of the problems presented.  Given the aforementioned information, the predictability of an adverse outcome is felt to be significant.    Karmen Bongo MD [facility]   How to contact the [facility] Attending or Consulting provider [place] or covering provider during after hours [place], for this patient?  Check the care team in [facility] and look for a) attending/consulting [facility] provider listed and b) the [facility] team listed Log into [email or web address] and use [place]'s universal password to access. If you do not have the password, please contact the hospital operator. Locate the [facility] provider you are looking for under [facility] and page to a number that you can be directly reached. If you still have difficulty reaching the provider, please page the [facility] (Director on Call) for the Hospitalists listed on amion for assistance.   04/15/2021, 2:51 PM

## 2021-04-15 NOTE — Progress Notes (Signed)
ANTICOAGULATION CONSULT NOTE - Initial Consult  Pharmacy Consult for Heparin Indication: chest pain/ACS  Allergies  Allergen Reactions   Codeine Itching    With rash    Patient Measurements: Height: '5\' 5"'$  (165.1 cm) Weight: 96.2 kg (212 lb) IBW/kg (Calculated) : 57 Heparin Dosing Weight: 78.7 kg  Vital Signs: Temp: 97.6 F (36.4 C) (07/30 0452) Temp Source: Oral (07/30 0452) BP: 149/84 (07/30 0804) Pulse Rate: 84 (07/30 0804)  Labs: Recent Labs    04/13/21 1018 04/15/21 0511 04/15/21 0806  HGB  --  12.8  --   HCT  --  39.4  --   PLT  --  258  --   CREATININE 0.78 0.93  --   TROPONINIHS  --  4 632*    Estimated Creatinine Clearance: 73.8 mL/min (by C-G formula based on SCr of 0.93 mg/dL).   Medical History: Past Medical History:  Diagnosis Date   Anxiety    HX OF ANXIETY ATTACKS   Back pain, chronic    Diabetes ([place])    started Metformin in 02/2021   Fibromyalgia    PT ON LYRIC    GERD (gastroesophageal reflux disease)    NOT ON ANY MEDS AT PRESENT   Headache(784.0)    HX OF MIGRAINES   Hypertension    HX OF ELEVATED B/P FOR SEVERAL YRS--NO MEDS UNTIL LAST WEEK--PT WAS C/O OF CHEST PRESSURE AND B/P ELEVATED--PT SAW NURSE PRACTIONER ON 08/21/11 AND STATES EKG WAS DONE--TOLD ABNORMAL--PUT ON B/P MEDICATION LISINOPRIL AND TOLD SHE WAS TO SEE A CARDIOLOGIST.    Medications:  (Not in a hospital admission)  Scheduled:  Infusions:   Assessment: 59 yof arriving to ED via EMS for chest pain that woke patient from sleep. Heparin per pharmacy placed for ACS work-up.  Not on anticoagulation prior to arrival. D-dimer 0.78. CBC stable. PLT 258.  Goal of Therapy:  Heparin level 0.3-0.7 units/ml Monitor platelets by anticoagulation protocol: Yes   Plan:  Give 4000 units bolus x 1 Start heparin infusion at 1000 units/hr Check anti-Xa level in 6-8 hours and daily while on heparin Continue to monitor H&H and platelets  Lorelei Pont, PharmD, BCPS 04/15/2021  10:17 AM ED Clinical Pharmacist -  [phone]

## 2021-04-15 NOTE — ED Provider Notes (Signed)
06:30: Assumed care of patient from Lisa Munroe PA-C at change of shift pending CTA & plan for admission.   Please see prior provider note for full H&P.  Briefly patient with hx of T2DM, HTN, obesity presented with chest pain that woke her from sleep, left sided, radiates into the LUE with associated dyspnea & LUE paresthesias. Pain resolved S/p aspirin & NTG.   Did desaturate to 86% on RA at one point during ED visit.  EKG with T wave inversions, initial troponin 4, delta pending.   CTA: Multiple findings as below, no dissection or PE noted.   Heart Score is a 5. Patient remains chest pain free on re-assessment. Delta pending/in process. Will discuss w/ hospitalist service for admission.   09:15: CONSULT: Discussed with hospitalist Dr. Lorin Casey- accepts admission.   Re-discussed with Dr. Lorin Casey due to delta troponin elevation noted- aware and has initiated heparin/cardiology consult.    Lisa Casey 04/15/21 1048    Lisa Morgan, MD 04/15/21 [phone]

## 2021-04-15 NOTE — ED Notes (Signed)
Pt in CT at this time.

## 2021-04-16 DIAGNOSIS — I214 Non-ST elevation (NSTEMI) myocardial infarction: Secondary | ICD-10-CM | POA: Diagnosis not present

## 2021-04-16 LAB — GLUCOSE, CAPILLARY
Glucose-Capillary: 104 mg/dL — ABNORMAL HIGH (ref 70–99)
Glucose-Capillary: 121 mg/dL — ABNORMAL HIGH (ref 70–99)
Glucose-Capillary: 103 mg/dL — ABNORMAL HIGH (ref 70–99)
Glucose-Capillary: 106 mg/dL — ABNORMAL HIGH (ref 70–99)
Glucose-Capillary: 113 mg/dL — ABNORMAL HIGH (ref 70–99)

## 2021-04-16 LAB — BASIC METABOLIC PANEL
Anion gap: 8 (ref 5–15)
BUN: 10 mg/dL (ref 6–20)
CO2: 25 mmol/L (ref 22–32)
Calcium: 9.2 mg/dL (ref 8.9–10.3)
Chloride: 105 mmol/L (ref 98–111)
Creatinine, Ser: 0.72 mg/dL (ref 0.44–1.00)
GFR, Estimated: >60 mL/min (ref >60)
Glucose, Bld: 115 mg/dL — ABNORMAL HIGH (ref 70–99)
Potassium: 4.1 mmol/L (ref 3.5–5.1)
Sodium: 138 mmol/L (ref 135–145)

## 2021-04-16 LAB — CBC
HCT: 39.3 % (ref 36.0–46.0)
Hemoglobin: 12.7 g/dL (ref 12.0–15.0)
MCH: 29.9 pg (ref 26.0–34.0)
MCHC: 32.3 g/dL (ref 30.0–36.0)
MCV: 92.5 fL (ref 80.0–100.0)
Platelets: 270 10*3/uL (ref 150–400)
RBC: 4.25 MIL/uL (ref 3.87–5.11)
RDW: 12 % (ref 11.5–15.5)
WBC: 5.6 10*3/uL (ref 4.0–10.5)
nRBC: 0 % (ref 0.0–0.2)

## 2021-04-16 LAB — LIPID PANEL
Cholesterol: 144 mg/dL (ref 0–200)
HDL: 61 mg/dL (ref >40)
LDL Cholesterol: 67 mg/dL (ref 0–99)
Total CHOL/HDL Ratio: 2.4 RATIO
Triglycerides: 81 mg/dL (ref <150)
VLDL: 16 mg/dL (ref 0–40)

## 2021-04-16 LAB — HEPARIN LEVEL (UNFRACTIONATED): Heparin Unfractionated: 0.44 IU/mL (ref 0.30–0.70)

## 2021-04-16 LAB — PROTIME-INR
INR: 1 (ref 0.8–1.2)
Prothrombin Time: 13.5 seconds (ref 11.4–15.2)

## 2021-04-16 MED ORDER — ALPRAZOLAM 0.5 MG PO TABS
0.2500 mg | ORAL_TABLET | Freq: Three times a day (TID) | ORAL | Status: DC | PRN
  Administered 2021-04-16 – 2021-04-17 (×2): 0.25 mg via ORAL
  Filled 2021-04-16 (×2): qty 1

## 2021-04-16 NOTE — Progress Notes (Signed)
Progress Note  Patient Name: Lisa Casey Date of Encounter: 04/16/2021  Primary Cardiologist: None   Subjective   Mild CP overnight. Did not sleep well due to blood draws and hospital activity.   Inpatient Medications    Scheduled Meds:  aspirin EC  81 mg Oral Daily   atorvastatin  40 mg Oral q1800   cycloSPORINE  1 drop Both Eyes BID   insulin aspart  0-15 Units Subcutaneous TID WC   insulin aspart  0-5 Units Subcutaneous QHS   metoprolol tartrate  12.5 mg Oral BID   PARoxetine  20 mg Oral Daily   sodium chloride flush  3 mL Intravenous Q12H   sodium chloride flush  3 mL Intravenous Q12H   Continuous Infusions:  sodium chloride     heparin 1,200 Units/hr (04/16/21 0310)   PRN Meds: sodium chloride, acetaminophen, albuterol, cyclobenzaprine, hydrOXYzine, neomycin-polymyxin b-dexamethasone, nitroGLYCERIN, ondansetron (ZOFRAN) IV, pregabalin, sodium chloride flush   Vital Signs    Vitals:   04/15/21 1824 04/15/21 1900 04/16/21 0000 04/16/21 0307  BP:  (!) 146/95 (!) 147/82 (!) 154/92  Pulse:  (!) 59 64 68  Resp:  '17 16 16  '$ Temp:  98.3 F (36.8 C) 98.2 F (36.8 C) 98.6 F (37 C)  TempSrc:  Oral Oral Oral  SpO2: 98% 99% 95% 98%  Weight:      Height:        Intake/Output Summary (Last 24 hours) at 04/16/2021 0832 Last data filed at 04/16/2021 0300 Gross per 24 hour  Intake 208.66 ml  Output --  Net 208.66 ml   Filed Weights   04/15/21 0449  Weight: 96.2 kg    Telemetry    SR, 3 beat NSVT - Personally Reviewed  ECG    SR, PAC - Personally Reviewed  Physical Exam   GEN: No acute distress.   Neck: No JVD Cardiac: regular rhythm, normal rate, no murmurs, rubs, or gallops.  Respiratory: Clear to auscultation bilaterally. GI: Soft, nontender, non-distended  MS: No edema; No deformity. Neuro:  Nonfocal  Psych: Normal affect   Labs    Chemistry Recent Labs  Lab 04/13/21 1018 04/15/21 0511 04/16/21 0051  NA 141 137 138  K 4.3 3.6 4.1   CL 104 106 105  CO2 '23 24 25  '$ GLUCOSE 106* 156* 115*  BUN '14 18 10  '$ CREATININE 0.78 0.93 0.72  CALCIUM 9.4 8.7* 9.2  PROT 6.9  --   --   ALBUMIN 4.3  --   --   AST 17  --   --   ALT 15  --   --   ALKPHOS 84  --   --   BILITOT 0.7  --   --   GFRNONAA  --  >60 >60  ANIONGAP  --  7 8     Hematology Recent Labs  Lab 04/15/21 0511 04/16/21 0051  WBC 7.4 5.6  RBC 4.20 4.25  HGB 12.8 12.7  HCT 39.4 39.3  MCV 93.8 92.5  MCH 30.5 29.9  MCHC 32.5 32.3  RDW 11.7 12.0  PLT 258 270    Cardiac EnzymesNo results for input(s): TROPONINI in the last 168 hours. No results for input(s): TROPIPOC in the last 168 hours.   BNPNo results for input(s): BNP, PROBNP in the last 168 hours.   DDimer  Recent Labs  Lab 04/15/21 0511  DDIMER 0.78*     Radiology    MR ANGIO HEAD WO CONTRAST  Result Date: 04/16/2021 CLINICAL DATA:  Initial evaluation for acute headache. EXAM: MRI HEAD WITHOUT CONTRAST MRA HEAD WITHOUT CONTRAST TECHNIQUE: Multiplanar, multi-echo pulse sequences of the brain and surrounding structures were acquired without intravenous contrast. Angiographic images of the Circle of Willis were acquired using MRA technique without intravenous contrast. COMPARISON:  No pertinent prior exam. FINDINGS: MRI HEAD FINDINGS Brain: Cerebral volume within normal limits. No significant cerebral white matter disease for age. No abnormal foci of restricted diffusion to suggest acute or subacute ischemia. Gray-white matter differentiation maintained. No encephalomalacia to suggest chronic cortical infarction. No evidence for acute or chronic intracranial hemorrhage. No mass lesion, midline shift or mass effect. No hydrocephalus or extra-axial fluid collection. Pituitary gland suprasellar region normal. Midline structures intact and normal. Vascular: Major intracranial vascular flow voids are maintained. Skull and upper cervical spine: Craniocervical junction within normal limits. Bone marrow signal  intensity normal. No scalp soft tissue abnormality. Sinuses/Orbits: Globes normal soft tissues within normal limits. Paranasal sinuses are largely clear. Small left mastoid effusion noted, of doubtful significance. Inner ear structures grossly normal. Other: None. MRA HEAD FINDINGS Anterior circulation: Visualized distal cervical segments of the internal carotid arteries are patent with antegrade flow. Petrous, cavernous, and supraclinoid segments patent without stenosis. 4 mm saccular outpouching arising from the cavernous right ICA suspicious for an aneurysm (series 1045, image 8). Prominent vascular infundibulum related to a small right posterior communicating artery noted as well. A1 segments widely patent. Normal anterior communicating artery complex. Anterior cerebral arteries patent to their distal aspects without stenosis. No M1 stenosis or occlusion. Left M1 bifurcates early. Bifurcations M cells within normal limits. Distal MCA branches well perfused and symmetric. Posterior circulation: Both vertebral arteries patent to the vertebrobasilar junction without stenosis. Right vertebral artery slightly dominant. Left PICA patent. Right PICA not seen. Basilar mildly tortuous but widely patent to its distal aspect without stenosis. Superior cerebral arteries patent bilaterally. Both PCAs primarily supplied via the basilar well perfused or distal aspects. Anatomic variants: None significant. IMPRESSION: MRI HEAD IMPRESSION: Normal brain MRI for age. No acute intracranial abnormality. MRA HEAD IMPRESSION: 1. Negative intracranial MRA for large vessel occlusion or hemodynamically significant stenosis. 2. 4 mm cavernous right ICA aneurysm. Electronically Signed   By: Jeannine Boga M.D.   On: 04/16/2021 00:09   MR BRAIN WO CONTRAST  Result Date: 04/16/2021 CLINICAL DATA:  Initial evaluation for acute headache. EXAM: MRI HEAD WITHOUT CONTRAST MRA HEAD WITHOUT CONTRAST TECHNIQUE: Multiplanar, multi-echo  pulse sequences of the brain and surrounding structures were acquired without intravenous contrast. Angiographic images of the Circle of Willis were acquired using MRA technique without intravenous contrast. COMPARISON:  No pertinent prior exam. FINDINGS: MRI HEAD FINDINGS Brain: Cerebral volume within normal limits. No significant cerebral white matter disease for age. No abnormal foci of restricted diffusion to suggest acute or subacute ischemia. Gray-white matter differentiation maintained. No encephalomalacia to suggest chronic cortical infarction. No evidence for acute or chronic intracranial hemorrhage. No mass lesion, midline shift or mass effect. No hydrocephalus or extra-axial fluid collection. Pituitary gland suprasellar region normal. Midline structures intact and normal. Vascular: Major intracranial vascular flow voids are maintained. Skull and upper cervical spine: Craniocervical junction within normal limits. Bone marrow signal intensity normal. No scalp soft tissue abnormality. Sinuses/Orbits: Globes normal soft tissues within normal limits. Paranasal sinuses are largely clear. Small left mastoid effusion noted, of doubtful significance. Inner ear structures grossly normal. Other: None. MRA HEAD FINDINGS Anterior circulation: Visualized distal cervical segments of the internal carotid arteries are patent with antegrade flow.  Petrous, cavernous, and supraclinoid segments patent without stenosis. 4 mm saccular outpouching arising from the cavernous right ICA suspicious for an aneurysm (series 1045, image 8). Prominent vascular infundibulum related to a small right posterior communicating artery noted as well. A1 segments widely patent. Normal anterior communicating artery complex. Anterior cerebral arteries patent to their distal aspects without stenosis. No M1 stenosis or occlusion. Left M1 bifurcates early. Bifurcations M cells within normal limits. Distal MCA branches well perfused and symmetric.  Posterior circulation: Both vertebral arteries patent to the vertebrobasilar junction without stenosis. Right vertebral artery slightly dominant. Left PICA patent. Right PICA not seen. Basilar mildly tortuous but widely patent to its distal aspect without stenosis. Superior cerebral arteries patent bilaterally. Both PCAs primarily supplied via the basilar well perfused or distal aspects. Anatomic variants: None significant. IMPRESSION: MRI HEAD IMPRESSION: Normal brain MRI for age. No acute intracranial abnormality. MRA HEAD IMPRESSION: 1. Negative intracranial MRA for large vessel occlusion or hemodynamically significant stenosis. 2. 4 mm cavernous right ICA aneurysm. Electronically Signed   By: Jeannine Boga M.D.   On: 04/16/2021 00:09   DG Chest Port 1 View  Result Date: 04/15/2021 CLINICAL DATA:  60 year old female with chest pain. EXAM: PORTABLE CHEST 1 VIEW COMPARISON:  Chest radiographs 09/13/2020 and earlier. FINDINGS: Portable AP semi upright view at 0552 hours. Lower lung volumes. Chronic lingula scarring. Increased patchy bibasilar opacity most resembling atelectasis. Mediastinal contours are stable and within normal limits. Chronic thoracic scoliosis. Visualized tracheal air column is within normal limits. No pneumothorax, pulmonary edema, pleural effusion or consolidation. Paucity of bowel gas. No acute osseous abnormality identified. IMPRESSION: Lower lung volumes with bibasilar atelectasis.  Scoliosis. Electronically Signed   By: Genevie Ann M.D.   On: 04/15/2021 06:25   ECHOCARDIOGRAM COMPLETE  Result Date: 04/15/2021    ECHOCARDIOGRAM REPORT   Patient Name:   JAIMYA WANKO Casey Date of Exam: 04/15/2021 Medical Rec #:  [id number]        Height:       65.0 in Accession #:    [id number]       Weight:       212.0 lb Date of Birth:  [birth year]        BSA:          2.028 m Patient Age:    29 years         BP:           141/82 mmHg Patient Gender: F                HR:           68 bpm. Exam  Location:  Inpatient Procedure: 2D Echo Indications:    chest pain  History:        Patient has no prior history of Echocardiogram examinations.                 Fibromyalgia; Risk Factors:Hypertension.  Sonographer:    Johny Chess Referring Phys: [place]  1. Left ventricular ejection fraction, by estimation, is 65 to 70%. The left ventricle has normal function. The left ventricle has no regional wall motion abnormalities. Left ventricular diastolic parameters were normal.  2. Right ventricular systolic function is normal. The right ventricular size is normal. There is normal pulmonary artery systolic pressure.  3. The mitral valve is normal in structure. Trivial mitral valve regurgitation. No evidence of mitral stenosis.  4. The aortic valve is tricuspid. Aortic valve regurgitation is not visualized. No  aortic stenosis is present.  5. The inferior vena cava is normal in size with greater than 50% respiratory variability, suggesting right atrial pressure of 3 mmHg. FINDINGS  Left Ventricle: Left ventricular ejection fraction, by estimation, is 65 to 70%. The left ventricle has normal function. The left ventricle has no regional wall motion abnormalities. The left ventricular internal cavity size was normal in size. There is  no left ventricular hypertrophy. Left ventricular diastolic parameters were normal. Right Ventricle: The right ventricular size is normal. No increase in right ventricular wall thickness. Right ventricular systolic function is normal. There is normal pulmonary artery systolic pressure. The tricuspid regurgitant velocity is 2.70 m/s, and  with an assumed right atrial pressure of 3 mmHg, the estimated right ventricular systolic pressure is [id number] mmHg. Left Atrium: Left atrial size was normal in size. Right Atrium: Right atrial size was normal in size. Pericardium: There is no evidence of pericardial effusion. Mitral Valve: The mitral valve is normal in structure.  Trivial mitral valve regurgitation. No evidence of mitral valve stenosis. Tricuspid Valve: The tricuspid valve is normal in structure. Tricuspid valve regurgitation is trivial. No evidence of tricuspid stenosis. Aortic Valve: The aortic valve is tricuspid. Aortic valve regurgitation is not visualized. No aortic stenosis is present. Pulmonic Valve: The pulmonic valve was normal in structure. Pulmonic valve regurgitation is not visualized. No evidence of pulmonic stenosis. Aorta: The aortic root is normal in size and structure. Venous: The inferior vena cava is normal in size with greater than 50% respiratory variability, suggesting right atrial pressure of 3 mmHg. IAS/Shunts: No atrial level shunt detected by color flow Doppler.  LEFT VENTRICLE PLAX 2D LVIDd:         3.80 cm  Diastology LVIDs:         2.10 cm  LV e' medial:    9.90 cm/s LV PW:         0.90 cm  LV E/e' medial:  7.7 LV IVS:        0.70 cm  LV e' lateral:   12.80 cm/s LVOT diam:     1.60 cm  LV E/e' lateral: 5.9 LV SV:         52 LV SV Index:   26 LVOT Area:     2.01 cm  RIGHT VENTRICLE             IVC RV S prime:     14.60 cm/s  IVC diam: 1.60 cm TAPSE (M-mode): 2.2 cm LEFT ATRIUM             Index       RIGHT ATRIUM           Index LA diam:        3.60 cm 1.78 cm/m  RA Area:     11.50 cm LA Vol (A2C):   59.6 ml 29.39 ml/m RA Volume:   23.40 ml  11.54 ml/m LA Vol (A4C):   50.4 ml 24.86 ml/m LA Biplane Vol: 54.6 ml 26.93 ml/m  AORTIC VALVE LVOT Vmax:   110.00 cm/s LVOT Vmean:  77.600 cm/s LVOT VTI:    0.258 m  AORTA Ao Root diam: 2.50 cm Ao Asc diam:  2.80 cm MITRAL VALVE               TRICUSPID VALVE MV Area (PHT): 3.85 cm    TR Peak grad:   29.2 mmHg MV Decel Time: 197 msec    TR Vmax:  270.00 cm/s MV E velocity: 75.80 cm/s MV A velocity: 69.00 cm/s  SHUNTS MV E/A ratio:  1.10        Systemic VTI:  0.26 m                            Systemic Diam: 1.60 cm Skeet Latch MD Electronically signed by Skeet Latch MD Signature  Date/Time: 04/15/2021/3:55:54 PM    Final    CT Angio Chest/Abd/Pel for Dissection W and/or Wo Contrast  Result Date: 04/15/2021 CLINICAL DATA:  60 year old female with central chest and abdominal pain which woke her from sleep. Chest pain and arm numbness. EXAM: CT ANGIOGRAPHY CHEST, ABDOMEN AND PELVIS TECHNIQUE: Non-contrast CT of the chest was initially obtained. Multidetector CT imaging through the chest, abdomen and pelvis was performed using the standard protocol during bolus administration of intravenous contrast. Multiplanar reconstructed images and MIPs were obtained and reviewed to evaluate the vascular anatomy. CONTRAST:  160m OMNIPAQUE IOHEXOL 350 MG/ML SOLN COMPARISON:  CT Abdomen and Pelvis 09/21/2012. Portable chest radiograph earlier today. FINDINGS: CTA CHEST FINDINGS Cardiovascular: Normal caliber thoracic aorta. Negative for aortic dissection. Cardiac size within normal limits. No pericardial effusion. Central and hilar pulmonary arteries also well contrast enhanced and appear to be patent. Some calcified coronary artery atherosclerosis is evident. Mediastinum/Nodes: Negative. No mediastinal mass or lymphadenopathy. Lungs/Pleura: Atelectatic changes to the major airways which remain patent. Platelike and streaky bilateral lower lobe and lingula opacity more resembles atelectasis than infection. A small right lower lobe lung nodule on series 8, image 68 was present in 2014 and not significantly changed compatible with benign etiology. Similar tiny subpleural left upper lobe nodule on series 8, image 51 is likely postinflammatory. No pleural effusion or consolidation. Musculoskeletal: Mild to moderate scoliosis. No acute osseous abnormality identified. Review of the MIP images confirms the above findings. CTA ABDOMEN AND PELVIS FINDINGS VASCULAR Abdominal aorta is normal. Major aortic branches are patent. Bilateral iliac arteries and visible proximal femoral arteries are normal. Review of the  MIP images confirms the above findings. NON-VASCULAR Hepatobiliary: Small gallstones in the neck of the gallbladder. No pericholecystic inflammation. Negative liver. Pancreas: Negative. Spleen: Negative. Adrenals/Urinary Tract: Normal adrenal glands. Best seen on precontrast series 5 there is extensive renal collecting system calcification suggesting medullary nephrocalcinosis and/or developing staghorn renal calculi. There is a 7 mm stone at the right ureteropelvic junction. Mild right hydronephrosis. No perinephric inflammatory stranding. Distal to the stone the right ureter appears decompressed. The left ureter is decompressed, but with indeterminate density throughout the lumen of the left ureter. No excreted IV contrast in the urinary bladder. The bladder is mildly distended but otherwise appears negative. Stomach/Bowel: Mild gas distended sigmoid. Mild retained stool and redundancy of the large bowel. Mild diverticulosis at the hepatic flexure. No active inflammation. Normal appendix series 7, image 204. Negative terminal ileum. No dilated small bowel. Negative stomach and duodenum. No free air, free fluid, mesenteric inflammation. Lymphatic: No lymphadenopathy. Reproductive: Absent uterus.  Diminutive or absent ovaries. Other: No pelvic free fluid. Musculoskeletal: Chronic degenerative S1 superior endplate sclerosis. No acute osseous abnormality identified. Review of the MIP images confirms the above findings. IMPRESSION: 1. Normal aorta. Central pulmonary arteries appear patent. Some calcified coronary artery atherosclerosis. 2. Abnormal kidney suggesting medullary nephrocalcinosis and/or developing renal staghorn calculi. And a 7 mm calculus at the right ureteropelvic junction appears to be causing mild Right Hydronephrosis. 3. Cholelithiasis but no CT evidence of acute cholecystitis. 4.  Streaky lung opacity in the lingula and both lower lobes more resembles atelectasis or scarring than infection. No  pleural effusion. 5. Small benign right lower lobe pulmonary nodule. There is a 2nd tiny left upper lobe nodule on series 8, image 51. No follow-up needed if patient is low-risk. Non-contrast chest CT can be considered in 12 months if patient is high-risk. This recommendation follows the consensus statement: Guidelines for Management of Incidental Pulmonary Nodules Detected on CT Images: From the Fleischner Society 2017; Radiology 2017; 284:228-243. Electronically Signed   By: Genevie Ann M.D.   On: 04/15/2021 08:22    Cardiac Studies  Echo:  1. Left ventricular ejection fraction, by estimation, is 65 to 70%. The  left ventricle has normal function. The left ventricle has no regional  wall motion abnormalities. Left ventricular diastolic parameters were  normal.   2. Right ventricular systolic function is normal. The right ventricular  size is normal. There is normal pulmonary artery systolic pressure.   3. The mitral valve is normal in structure. Trivial mitral valve  regurgitation. No evidence of mitral stenosis.   4. The aortic valve is tricuspid. Aortic valve regurgitation is not  visualized. No aortic stenosis is present.   5. The inferior vena cava is normal in size with greater than 50%  respiratory variability, suggesting right atrial pressure of 3 mmHg.   Patient Profile     60 y.o. female  with a hx of obesity, anxiety, pre-DM, fibromyalgia, GERD, HTN, historically short PR interval, former brief tobacco use, admitted for NSTEMI   Assessment & Plan   Principal Problem:   NSTEMI (non-ST elevated myocardial infarction) ([place]) Active Problems:   Essential hypertension   Obesity (BMI 30-39.9)   Prediabetes   Fibromyalgia   Headache   Cholelithiasis   Anxiety   NSTEMI -Repeat EKG yesterday evening showed normalization of T waves from prior.  -Continue aspirin, statin, IV heparin. -Plan for cardiac catheterization on Monday unless she develops unrelenting chest pain or worrisome  dynamic ECG changes. - echo wnl - continue metoprolol 12.5 mg BID. - 4 mm cavernous ICA aneurysm noted on brain MRA, will review with IC prior to cath if any contraindication to proceeding.      For questions or updates, please contact [place] Please consult [email or web address] for contact info under        Signed, Elouise Munroe, MD  04/16/2021, 8:32 AM

## 2021-04-16 NOTE — Plan of Care (Signed)
  Problem: Education: Goal: Knowledge of General Education information will improve Description Including pain rating scale, medication(s)/side effects and non-pharmacologic comfort measures Outcome: Progressing   Problem: Health Behavior/Discharge Planning: Goal: Ability to manage health-related needs will improve Outcome: Progressing   Problem: Clinical Measurements: Goal: Ability to maintain clinical measurements within normal limits will improve Outcome: Progressing Goal: Will remain free from infection Outcome: Progressing Goal: Diagnostic test results will improve Outcome: Progressing Goal: Respiratory complications will improve Outcome: Progressing Goal: Cardiovascular complication will be avoided Outcome: Progressing   Problem: Activity: Goal: Risk for activity intolerance will decrease Outcome: Progressing   Problem: Nutrition: Goal: Adequate nutrition will be maintained Outcome: Progressing   Problem: Elimination: Goal: Will not experience complications related to bowel motility Outcome: Progressing Goal: Will not experience complications related to urinary retention Outcome: Progressing   Problem: Pain Managment: Goal: General experience of comfort will improve Outcome: Progressing   Problem: Safety: Goal: Ability to remain free from injury will improve Outcome: Progressing   

## 2021-04-16 NOTE — Progress Notes (Signed)
PROGRESS NOTE    Lisa Casey  [id number] DOB: 1961/09/11 DOA: 04/15/2021 PCP: [name], FNP    Chief Complaint  Patient presents with   Chest Pain    Brief Narrative:  Lisa Casey is a 60 y.o. female with medical history significant of fibromyalgia; anxiety; chronic back pain; and HTN presenting with chest pain.  Assessment & Plan:   Principal Problem:   NSTEMI (non-ST elevated myocardial infarction) ([place]) Active Problems:   Essential hypertension   Obesity (BMI 30-39.9)   Prediabetes   Fibromyalgia   Headache   Cholelithiasis   Anxiety   NSTEMI: -chest pain appears to have improved.  Echo unremarkable Cardiology consulted and she is scheduled for cath in the morning.  Meanwhile continue with aspirin, heparin, statin.    Essential hypertension Continue with home medications.   Type 2 diabetes mellitus Last A1c was 6.4 Continue with sliding scale insulin.   Headache Resolved MRI and MRA unremarkable.     Anxiety Prn xanax in addition to vistiril.    Body mass index is 35.28 kg/m. Outpatient follow up .    Fibromyalgia Continue with home medications.    DVT prophylaxis: Heparin Code Status: (Full Code) Family Communication: (none at bedside) Disposition:   Status is: Inpatient  Remains inpatient appropriate because:Ongoing diagnostic testing needed not appropriate for outpatient work up and IV treatments appropriate due to intensity of illness or inability to take PO  Dispo: The patient is from: Home              Anticipated d/c is to: Home              Patient currently is not medically stable to d/c.   Difficult to place patient No       Consultants:  cardiology  Procedures: echo MRI brain MRA head and neck.  Antimicrobials: none.   Subjective: Headache resolved.   Objective: Vitals:   04/16/21 0000 04/16/21 0307 04/16/21 0715 04/16/21 1136  BP: (!) 147/82 (!) 154/92  (!) 156/81  Pulse: 64 68  65   Resp: 16 16    Temp: 98.2 F (36.8 C) 98.6 F (37 C) 98.5 F (36.9 C)   TempSrc: Oral Oral Oral   SpO2: 95% 98%    Weight:      Height:        Intake/Output Summary (Last 24 hours) at 04/16/2021 1308 Last data filed at 04/16/2021 0300 Gross per 24 hour  Intake 208.66 ml  Output --  Net 208.66 ml   Filed Weights   04/15/21 0449  Weight: 96.2 kg    Examination:  General exam: Appears calm and comfortable  Respiratory system: Clear to auscultation. Respiratory effort normal. Cardiovascular system: S1 & S2 heard, RRR. No JVD, murmurs, rubs, gallops or clicks. No pedal edema. Gastrointestinal system: Abdomen is nondistended, soft and nontender. No organomegaly or masses felt. Normal bowel sounds heard. Central nervous system: Alert and oriented. No focal neurological deficits. Extremities: Symmetric 5 x 5 power. Skin: No rashes, lesions or ulcers Psychiatry: Mood & affect appropriate.     Data Reviewed: I have personally reviewed following labs and imaging studies  CBC: Recent Labs  Lab 04/15/21 0511 04/16/21 0051  WBC 7.4 5.6  HGB 12.8 12.7  HCT 39.4 39.3  MCV 93.8 92.5  PLT 258 [id number]    Basic Metabolic Panel: Recent Labs  Lab 04/13/21 1018 04/15/21 0511 04/15/21 1503 04/16/21 0051  NA 141 137  --  138  K 4.3 3.6  --  4.1  CL 104 106  --  105  CO2 23 24  --  25  GLUCOSE 106* 156*  --  115*  BUN 14 18  --  10  CREATININE 0.78 0.93  --  0.72  CALCIUM 9.4 8.7*  --  9.2  MG  --   --  2.0  --     GFR: Estimated Creatinine Clearance: 85.8 mL/min (by C-G formula based on SCr of 0.72 mg/dL).  Liver Function Tests: Recent Labs  Lab 04/13/21 1018  AST 17  ALT 15  ALKPHOS 84  BILITOT 0.7  PROT 6.9  ALBUMIN 4.3    CBG: Recent Labs  Lab 04/15/21 1318 04/15/21 1600 04/15/21 2118 04/16/21 0618 04/16/21 1129  GLUCAP 107* 104* 122* 104* 121*     Recent Results (from the past 240 hour(s))  Resp Panel by RT-PCR (Flu A&B, Covid) Nasopharyngeal  Swab     Status: None   Collection Time: 04/15/21  8:56 AM   Specimen: Nasopharyngeal Swab; Nasopharyngeal(NP) swabs in vial transport medium  Result Value Ref Range Status   SARS Coronavirus 2 by RT PCR NEGATIVE NEGATIVE Final    Comment: (NOTE) SARS-CoV-2 target nucleic acids are NOT DETECTED.  The SARS-CoV-2 RNA is generally detectable in upper respiratory specimens during the acute phase of infection. The lowest concentration of SARS-CoV-2 viral copies this assay can detect is 138 copies/mL. A negative result does not preclude SARS-Cov-2 infection and should not be used as the sole basis for treatment or other patient management decisions. A negative result may occur with  improper specimen collection/handling, submission of specimen other than nasopharyngeal swab, presence of viral mutation(s) within the areas targeted by this assay, and inadequate number of viral copies(<138 copies/mL). A negative result must be combined with clinical observations, patient history, and epidemiological information. The expected result is Negative.  Fact Sheet for Patients:  EntrepreneurPulse.com.au  Fact Sheet for Healthcare Providers:  IncredibleEmployment.be  This test is no t yet approved or cleared by the [place] FDA and  has been authorized for detection and/or diagnosis of SARS-CoV-2 by FDA under an Emergency Use Authorization (EUA). This EUA will remain  in effect (meaning this test can be used) for the duration of the COVID-19 declaration under Section 564(b)(1) of the Act, 21 U.S.C.section 360bbb-3(b)(1), unless the authorization is terminated  or revoked sooner.       Influenza A by PCR NEGATIVE NEGATIVE Final   Influenza B by PCR NEGATIVE NEGATIVE Final    Comment: (NOTE) The Xpert Xpress SARS-CoV-2/FLU/RSV plus assay is intended as an aid in the diagnosis of influenza from Nasopharyngeal swab specimens and should not be used as a sole  basis for treatment. Nasal washings and aspirates are unacceptable for Xpert Xpress SARS-CoV-2/FLU/RSV testing.  Fact Sheet for Patients: EntrepreneurPulse.com.au  Fact Sheet for Healthcare Providers: IncredibleEmployment.be  This test is not yet approved or cleared by the [place] FDA and has been authorized for detection and/or diagnosis of SARS-CoV-2 by FDA under an Emergency Use Authorization (EUA). This EUA will remain in effect (meaning this test can be used) for the duration of the COVID-19 declaration under Section 564(b)(1) of the Act, 21 U.S.C. section 360bbb-3(b)(1), unless the authorization is terminated or revoked.  Performed at [facility], [place] [address] [place], [place] [address]          Radiology Studies: MR ANGIO HEAD WO CONTRAST  Result Date: 04/16/2021 CLINICAL DATA:  Initial evaluation for acute headache. EXAM: MRI HEAD  WITHOUT CONTRAST MRA HEAD WITHOUT CONTRAST TECHNIQUE: Multiplanar, multi-echo pulse sequences of the brain and surrounding structures were acquired without intravenous contrast. Angiographic images of the Circle of Willis were acquired using MRA technique without intravenous contrast. COMPARISON:  No pertinent prior exam. FINDINGS: MRI HEAD FINDINGS Brain: Cerebral volume within normal limits. No significant cerebral white matter disease for age. No abnormal foci of restricted diffusion to suggest acute or subacute ischemia. Gray-white matter differentiation maintained. No encephalomalacia to suggest chronic cortical infarction. No evidence for acute or chronic intracranial hemorrhage. No mass lesion, midline shift or mass effect. No hydrocephalus or extra-axial fluid collection. Pituitary gland suprasellar region normal. Midline structures intact and normal. Vascular: Major intracranial vascular flow voids are maintained. Skull and upper cervical spine: Craniocervical junction within normal limits.  Bone marrow signal intensity normal. No scalp soft tissue abnormality. Sinuses/Orbits: Globes normal soft tissues within normal limits. Paranasal sinuses are largely clear. Small left mastoid effusion noted, of doubtful significance. Inner ear structures grossly normal. Other: None. MRA HEAD FINDINGS Anterior circulation: Visualized distal cervical segments of the internal carotid arteries are patent with antegrade flow. Petrous, cavernous, and supraclinoid segments patent without stenosis. 4 mm saccular outpouching arising from the cavernous right ICA suspicious for an aneurysm (series 1045, image 8). Prominent vascular infundibulum related to a small right posterior communicating artery noted as well. A1 segments widely patent. Normal anterior communicating artery complex. Anterior cerebral arteries patent to their distal aspects without stenosis. No M1 stenosis or occlusion. Left M1 bifurcates early. Bifurcations M cells within normal limits. Distal MCA branches well perfused and symmetric. Posterior circulation: Both vertebral arteries patent to the vertebrobasilar junction without stenosis. Right vertebral artery slightly dominant. Left PICA patent. Right PICA not seen. Basilar mildly tortuous but widely patent to its distal aspect without stenosis. Superior cerebral arteries patent bilaterally. Both PCAs primarily supplied via the basilar well perfused or distal aspects. Anatomic variants: None significant. IMPRESSION: MRI HEAD IMPRESSION: Normal brain MRI for age. No acute intracranial abnormality. MRA HEAD IMPRESSION: 1. Negative intracranial MRA for large vessel occlusion or hemodynamically significant stenosis. 2. 4 mm cavernous right ICA aneurysm. Electronically Signed   By: Jeannine Boga M.D.   On: 04/16/2021 00:09   MR BRAIN WO CONTRAST  Result Date: 04/16/2021 CLINICAL DATA:  Initial evaluation for acute headache. EXAM: MRI HEAD WITHOUT CONTRAST MRA HEAD WITHOUT CONTRAST TECHNIQUE:  Multiplanar, multi-echo pulse sequences of the brain and surrounding structures were acquired without intravenous contrast. Angiographic images of the Circle of Willis were acquired using MRA technique without intravenous contrast. COMPARISON:  No pertinent prior exam. FINDINGS: MRI HEAD FINDINGS Brain: Cerebral volume within normal limits. No significant cerebral white matter disease for age. No abnormal foci of restricted diffusion to suggest acute or subacute ischemia. Gray-white matter differentiation maintained. No encephalomalacia to suggest chronic cortical infarction. No evidence for acute or chronic intracranial hemorrhage. No mass lesion, midline shift or mass effect. No hydrocephalus or extra-axial fluid collection. Pituitary gland suprasellar region normal. Midline structures intact and normal. Vascular: Major intracranial vascular flow voids are maintained. Skull and upper cervical spine: Craniocervical junction within normal limits. Bone marrow signal intensity normal. No scalp soft tissue abnormality. Sinuses/Orbits: Globes normal soft tissues within normal limits. Paranasal sinuses are largely clear. Small left mastoid effusion noted, of doubtful significance. Inner ear structures grossly normal. Other: None. MRA HEAD FINDINGS Anterior circulation: Visualized distal cervical segments of the internal carotid arteries are patent with antegrade flow. Petrous, cavernous, and supraclinoid segments patent without stenosis.  4 mm saccular outpouching arising from the cavernous right ICA suspicious for an aneurysm (series 1045, image 8). Prominent vascular infundibulum related to a small right posterior communicating artery noted as well. A1 segments widely patent. Normal anterior communicating artery complex. Anterior cerebral arteries patent to their distal aspects without stenosis. No M1 stenosis or occlusion. Left M1 bifurcates early. Bifurcations M cells within normal limits. Distal MCA branches well  perfused and symmetric. Posterior circulation: Both vertebral arteries patent to the vertebrobasilar junction without stenosis. Right vertebral artery slightly dominant. Left PICA patent. Right PICA not seen. Basilar mildly tortuous but widely patent to its distal aspect without stenosis. Superior cerebral arteries patent bilaterally. Both PCAs primarily supplied via the basilar well perfused or distal aspects. Anatomic variants: None significant. IMPRESSION: MRI HEAD IMPRESSION: Normal brain MRI for age. No acute intracranial abnormality. MRA HEAD IMPRESSION: 1. Negative intracranial MRA for large vessel occlusion or hemodynamically significant stenosis. 2. 4 mm cavernous right ICA aneurysm. Electronically Signed   By: Jeannine Boga M.D.   On: 04/16/2021 00:09   DG Chest Port 1 View  Result Date: 04/15/2021 CLINICAL DATA:  60 year old female with chest pain. EXAM: PORTABLE CHEST 1 VIEW COMPARISON:  Chest radiographs 09/13/2020 and earlier. FINDINGS: Portable AP semi upright view at 0552 hours. Lower lung volumes. Chronic lingula scarring. Increased patchy bibasilar opacity most resembling atelectasis. Mediastinal contours are stable and within normal limits. Chronic thoracic scoliosis. Visualized tracheal air column is within normal limits. No pneumothorax, pulmonary edema, pleural effusion or consolidation. Paucity of bowel gas. No acute osseous abnormality identified. IMPRESSION: Lower lung volumes with bibasilar atelectasis.  Scoliosis. Electronically Signed   By: Genevie Ann M.D.   On: 04/15/2021 06:25   ECHOCARDIOGRAM COMPLETE  Result Date: 04/15/2021    ECHOCARDIOGRAM REPORT   Patient Name:   Lisa Casey Date of Exam: 04/15/2021 Medical Rec #:  [id number]        Height:       65.0 in Accession #:    [id number]       Weight:       212.0 lb Date of Birth:  March 27, 1961        BSA:          2.028 m Patient Age:    3 years         BP:           141/82 mmHg Patient Gender: F                HR:            68 bpm. Exam Location:  Inpatient Procedure: 2D Echo Indications:    chest pain  History:        Patient has no prior history of Echocardiogram examinations.                 Fibromyalgia; Risk Factors:Hypertension.  Sonographer:    Johny Chess Referring Phys: [place]  1. Left ventricular ejection fraction, by estimation, is 65 to 70%. The left ventricle has normal function. The left ventricle has no regional wall motion abnormalities. Left ventricular diastolic parameters were normal.  2. Right ventricular systolic function is normal. The right ventricular size is normal. There is normal pulmonary artery systolic pressure.  3. The mitral valve is normal in structure. Trivial mitral valve regurgitation. No evidence of mitral stenosis.  4. The aortic valve is tricuspid. Aortic valve regurgitation is not visualized. No aortic stenosis is present.  5. The inferior  vena cava is normal in size with greater than 50% respiratory variability, suggesting right atrial pressure of 3 mmHg. FINDINGS  Left Ventricle: Left ventricular ejection fraction, by estimation, is 65 to 70%. The left ventricle has normal function. The left ventricle has no regional wall motion abnormalities. The left ventricular internal cavity size was normal in size. There is  no left ventricular hypertrophy. Left ventricular diastolic parameters were normal. Right Ventricle: The right ventricular size is normal. No increase in right ventricular wall thickness. Right ventricular systolic function is normal. There is normal pulmonary artery systolic pressure. The tricuspid regurgitant velocity is 2.70 m/s, and  with an assumed right atrial pressure of 3 mmHg, the estimated right ventricular systolic pressure is [id number] mmHg. Left Atrium: Left atrial size was normal in size. Right Atrium: Right atrial size was normal in size. Pericardium: There is no evidence of pericardial effusion. Mitral Valve: The mitral valve is normal in  structure. Trivial mitral valve regurgitation. No evidence of mitral valve stenosis. Tricuspid Valve: The tricuspid valve is normal in structure. Tricuspid valve regurgitation is trivial. No evidence of tricuspid stenosis. Aortic Valve: The aortic valve is tricuspid. Aortic valve regurgitation is not visualized. No aortic stenosis is present. Pulmonic Valve: The pulmonic valve was normal in structure. Pulmonic valve regurgitation is not visualized. No evidence of pulmonic stenosis. Aorta: The aortic root is normal in size and structure. Venous: The inferior vena cava is normal in size with greater than 50% respiratory variability, suggesting right atrial pressure of 3 mmHg. IAS/Shunts: No atrial level shunt detected by color flow Doppler.  LEFT VENTRICLE PLAX 2D LVIDd:         3.80 cm  Diastology LVIDs:         2.10 cm  LV e' medial:    9.90 cm/s LV PW:         0.90 cm  LV E/e' medial:  7.7 LV IVS:        0.70 cm  LV e' lateral:   12.80 cm/s LVOT diam:     1.60 cm  LV E/e' lateral: 5.9 LV SV:         52 LV SV Index:   26 LVOT Area:     2.01 cm  RIGHT VENTRICLE             IVC RV S prime:     14.60 cm/s  IVC diam: 1.60 cm TAPSE (M-mode): 2.2 cm LEFT ATRIUM             Index       RIGHT ATRIUM           Index LA diam:        3.60 cm 1.78 cm/m  RA Area:     11.50 cm LA Vol (A2C):   59.6 ml 29.39 ml/m RA Volume:   23.40 ml  11.54 ml/m LA Vol (A4C):   50.4 ml 24.86 ml/m LA Biplane Vol: 54.6 ml 26.93 ml/m  AORTIC VALVE LVOT Vmax:   110.00 cm/s LVOT Vmean:  77.600 cm/s LVOT VTI:    0.258 m  AORTA Ao Root diam: 2.50 cm Ao Asc diam:  2.80 cm MITRAL VALVE               TRICUSPID VALVE MV Area (PHT): 3.85 cm    TR Peak grad:   29.2 mmHg MV Decel Time: 197 msec    TR Vmax:        270.00 cm/s MV E velocity: 75.80 cm/s MV  A velocity: 69.00 cm/s  SHUNTS MV E/A ratio:  1.10        Systemic VTI:  0.26 m                            Systemic Diam: 1.60 cm Skeet Latch MD Electronically signed by Skeet Latch MD  Signature Date/Time: 04/15/2021/3:55:54 PM    Final    CT Angio Chest/Abd/Pel for Dissection W and/or Wo Contrast  Result Date: 04/15/2021 CLINICAL DATA:  60 year old female with central chest and abdominal pain which woke her from sleep. Chest pain and arm numbness. EXAM: CT ANGIOGRAPHY CHEST, ABDOMEN AND PELVIS TECHNIQUE: Non-contrast CT of the chest was initially obtained. Multidetector CT imaging through the chest, abdomen and pelvis was performed using the standard protocol during bolus administration of intravenous contrast. Multiplanar reconstructed images and MIPs were obtained and reviewed to evaluate the vascular anatomy. CONTRAST:  141m OMNIPAQUE IOHEXOL 350 MG/ML SOLN COMPARISON:  CT Abdomen and Pelvis 09/21/2012. Portable chest radiograph earlier today. FINDINGS: CTA CHEST FINDINGS Cardiovascular: Normal caliber thoracic aorta. Negative for aortic dissection. Cardiac size within normal limits. No pericardial effusion. Central and hilar pulmonary arteries also well contrast enhanced and appear to be patent. Some calcified coronary artery atherosclerosis is evident. Mediastinum/Nodes: Negative. No mediastinal mass or lymphadenopathy. Lungs/Pleura: Atelectatic changes to the major airways which remain patent. Platelike and streaky bilateral lower lobe and lingula opacity more resembles atelectasis than infection. A small right lower lobe lung nodule on series 8, image 68 was present in 2014 and not significantly changed compatible with benign etiology. Similar tiny subpleural left upper lobe nodule on series 8, image 51 is likely postinflammatory. No pleural effusion or consolidation. Musculoskeletal: Mild to moderate scoliosis. No acute osseous abnormality identified. Review of the MIP images confirms the above findings. CTA ABDOMEN AND PELVIS FINDINGS VASCULAR Abdominal aorta is normal. Major aortic branches are patent. Bilateral iliac arteries and visible proximal femoral arteries are normal. Review  of the MIP images confirms the above findings. NON-VASCULAR Hepatobiliary: Small gallstones in the neck of the gallbladder. No pericholecystic inflammation. Negative liver. Pancreas: Negative. Spleen: Negative. Adrenals/Urinary Tract: Normal adrenal glands. Best seen on precontrast series 5 there is extensive renal collecting system calcification suggesting medullary nephrocalcinosis and/or developing staghorn renal calculi. There is a 7 mm stone at the right ureteropelvic junction. Mild right hydronephrosis. No perinephric inflammatory stranding. Distal to the stone the right ureter appears decompressed. The left ureter is decompressed, but with indeterminate density throughout the lumen of the left ureter. No excreted IV contrast in the urinary bladder. The bladder is mildly distended but otherwise appears negative. Stomach/Bowel: Mild gas distended sigmoid. Mild retained stool and redundancy of the large bowel. Mild diverticulosis at the hepatic flexure. No active inflammation. Normal appendix series 7, image 204. Negative terminal ileum. No dilated small bowel. Negative stomach and duodenum. No free air, free fluid, mesenteric inflammation. Lymphatic: No lymphadenopathy. Reproductive: Absent uterus.  Diminutive or absent ovaries. Other: No pelvic free fluid. Musculoskeletal: Chronic degenerative S1 superior endplate sclerosis. No acute osseous abnormality identified. Review of the MIP images confirms the above findings. IMPRESSION: 1. Normal aorta. Central pulmonary arteries appear patent. Some calcified coronary artery atherosclerosis. 2. Abnormal kidney suggesting medullary nephrocalcinosis and/or developing renal staghorn calculi. And a 7 mm calculus at the right ureteropelvic junction appears to be causing mild Right Hydronephrosis. 3. Cholelithiasis but no CT evidence of acute cholecystitis. 4. Streaky lung opacity in the lingula and both  lower lobes more resembles atelectasis or scarring than infection. No  pleural effusion. 5. Small benign right lower lobe pulmonary nodule. There is a 2nd tiny left upper lobe nodule on series 8, image 51. No follow-up needed if patient is low-risk. Non-contrast chest CT can be considered in 12 months if patient is high-risk. This recommendation follows the consensus statement: Guidelines for Management of Incidental Pulmonary Nodules Detected on CT Images: From the Fleischner Society 2017; Radiology 2017; 284:228-243. Electronically Signed   By: Genevie Ann M.D.   On: 04/15/2021 08:22        Scheduled Meds:  aspirin EC  81 mg Oral Daily   atorvastatin  40 mg Oral q1800   cycloSPORINE  1 drop Both Eyes BID   insulin aspart  0-15 Units Subcutaneous TID WC   insulin aspart  0-5 Units Subcutaneous QHS   metoprolol tartrate  12.5 mg Oral BID   PARoxetine  20 mg Oral Daily   sodium chloride flush  3 mL Intravenous Q12H   sodium chloride flush  3 mL Intravenous Q12H   Continuous Infusions:  sodium chloride     heparin 1,200 Units/hr (04/16/21 0310)     LOS: 1 day       Hosie Poisson, MD [facility]   To contact the attending provider between 7A-7P or the covering provider during after hours 7P-7A, please log into the web site [email or web address] and access using universal [place] password for that web site. If you do not have the password, please call the hospital operator.  04/16/2021, 1:08 PM

## 2021-04-16 NOTE — Progress Notes (Signed)
ANTICOAGULATION CONSULT NOTE   Pharmacy Consult for Heparin Indication: chest pain/ACS  Allergies  Allergen Reactions   Codeine Itching    With rash    Patient Measurements: Height: '5\' 5"'$  (165.1 cm) Weight: 96.2 kg (212 lb) IBW/kg (Calculated) : 57 Heparin Dosing Weight: 78.7 kg  Vital Signs: Temp: 98.6 F (37 C) (07/31 0307) Temp Source: Oral (07/31 0307) BP: 154/92 (07/31 0307) Pulse Rate: 68 (07/31 0307)  Labs: Recent Labs    04/13/21 1018 04/15/21 0511 04/15/21 0806 04/15/21 0938 04/15/21 1503 04/15/21 1547 04/15/21 1749 04/15/21 2024 04/15/21 2139 04/16/21 0051  HGB  --  12.8  --   --   --   --   --   --   --  12.7  HCT  --  39.4  --   --   --   --   --   --   --  39.3  PLT  --  258  --   --   --   --   --   --   --  270  LABPROT  --   --   --  12.1  --   --   --   --   --  13.5  INR  --   --   --  0.9  --   --   --   --   --  1.0  HEPARINUNFRC  --   --   --   --   --   --  0.27*  --   --  0.44  CREATININE 0.78 0.93  --   --   --   --   --   --   --  0.72  TROPONINIHS  --  4   < >  --    < > 1,865*  --  931* 689*  --    < > = values in this interval not displayed.     Estimated Creatinine Clearance: 85.8 mL/min (by C-G formula based on SCr of 0.72 mg/dL).   Medical History: Past Medical History:  Diagnosis Date   Anxiety    HX OF ANXIETY ATTACKS   Back pain, chronic    Cholelithiasis 04/15/2021   Diabetes ([place])    started Metformin in 02/2021   Fibromyalgia    GERD (gastroesophageal reflux disease)    Hypertension    Migraines    HX OF MIGRAINES   Obesity    Shortened PR interval     Medications:   Scheduled:   Infusions:   sodium chloride     heparin 1,200 Units/hr (04/16/21 0310)    Assessment: 59 yof arriving to ED via EMS for chest pain that woke patient from sleep. Heparin per pharmacy placed for ACS work-up. Not on anticoagulation prior to arrival. Plans for possible cath on 8/1 -heparin level at goal on 1200 units/hr  Goal  of Therapy:  Heparin level 0.3-0.7 units/ml Monitor platelets by anticoagulation protocol: Yes   Plan:  Continue IV heparin at 1200 units/hr. Daily heparin level and CBC.  Hildred Laser, PharmD Clinical Pharmacist **Pharmacist phone directory can now be found on [email or web address] ([email or web address]).  Listed under [facility].

## 2021-04-17 ENCOUNTER — Encounter (HOSPITAL_COMMUNITY)
Admission: EM | Disposition: A | Discharge status: Home / Self Care | Payer: Self-pay | Source: Home / Self Care | Attending: Internal Medicine

## 2021-04-17 ENCOUNTER — Encounter (HOSPITAL_COMMUNITY): Payer: Self-pay | Admitting: Cardiovascular Disease

## 2021-04-17 DIAGNOSIS — F419 Anxiety disorder, unspecified: Secondary | ICD-10-CM

## 2021-04-17 DIAGNOSIS — R079 Chest pain, unspecified: Secondary | ICD-10-CM | POA: Diagnosis not present

## 2021-04-17 DIAGNOSIS — I214 Non-ST elevation (NSTEMI) myocardial infarction: Secondary | ICD-10-CM | POA: Diagnosis not present

## 2021-04-17 DIAGNOSIS — R778 Other specified abnormalities of plasma proteins: Secondary | ICD-10-CM

## 2021-04-17 DIAGNOSIS — R7989 Other specified abnormal findings of blood chemistry: Secondary | ICD-10-CM

## 2021-04-17 HISTORY — PX: LEFT HEART CATH AND CORONARY ANGIOGRAPHY: CATH118249

## 2021-04-17 LAB — HEPARIN LEVEL (UNFRACTIONATED): Heparin Unfractionated: 0.39 IU/mL (ref 0.30–0.70)

## 2021-04-17 LAB — GLUCOSE, CAPILLARY
Glucose-Capillary: 125 mg/dL — ABNORMAL HIGH (ref 70–99)
Glucose-Capillary: 113 mg/dL — ABNORMAL HIGH (ref 70–99)
Glucose-Capillary: 104 mg/dL — ABNORMAL HIGH (ref 70–99)
Glucose-Capillary: 96 mg/dL (ref 70–99)
Glucose-Capillary: 111 mg/dL — ABNORMAL HIGH (ref 70–99)

## 2021-04-17 SURGERY — LEFT HEART CATH AND CORONARY ANGIOGRAPHY
Anesthesia: LOCAL

## 2021-04-17 MED ORDER — METOPROLOL TARTRATE 25 MG PO TABS
12.5000 mg | ORAL_TABLET | Freq: Two times a day (BID) | ORAL | 0 refills | Status: DC
Start: 2021-04-17 — End: 2021-06-29

## 2021-04-17 MED ORDER — MIDAZOLAM HCL 2 MG/2ML IJ SOLN
INTRAMUSCULAR | Status: DC | PRN
  Administered 2021-04-17: 2 mg via INTRAVENOUS

## 2021-04-17 MED ORDER — LIDOCAINE HCL (PF) 1 % IJ SOLN
INTRAMUSCULAR | Status: AC
  Filled 2021-04-17: qty 30

## 2021-04-17 MED ORDER — SODIUM CHLORIDE 0.9 % WEIGHT BASED INFUSION
1.0000 mL/kg/h | INTRAVENOUS | Status: DC
  Administered 2021-04-17: 1 mL/kg/h via INTRAVENOUS

## 2021-04-17 MED ORDER — SODIUM CHLORIDE 0.9% FLUSH: 3.0000 mL | INTRAVENOUS | Status: DC | PRN

## 2021-04-17 MED ORDER — VERAPAMIL HCL 2.5 MG/ML IV SOLN
INTRAVENOUS | Status: DC | PRN
  Administered 2021-04-17: 10 mL via INTRA_ARTERIAL

## 2021-04-17 MED ORDER — HYDRALAZINE HCL 20 MG/ML IJ SOLN: 10.0000 mg | INTRAMUSCULAR | Status: AC | PRN

## 2021-04-17 MED ORDER — HEPARIN (PORCINE) IN NACL 1000-0.9 UT/500ML-% IV SOLN
INTRAVENOUS | Status: AC
  Filled 2021-04-17: qty 500

## 2021-04-17 MED ORDER — HEPARIN SODIUM (PORCINE) 1000 UNIT/ML IJ SOLN
INTRAMUSCULAR | Status: DC | PRN
  Administered 2021-04-17: 5000 [IU] via INTRAVENOUS

## 2021-04-17 MED ORDER — ASPIRIN 81 MG PO TBEC: 81.0000 mg | DELAYED_RELEASE_TABLET | Freq: Every day | ORAL | 11 refills | Status: DC

## 2021-04-17 MED ORDER — FENTANYL CITRATE (PF) 100 MCG/2ML IJ SOLN
INTRAMUSCULAR | Status: DC | PRN
  Administered 2021-04-17: 50 ug via INTRAVENOUS

## 2021-04-17 MED ORDER — LABETALOL HCL 5 MG/ML IV SOLN: 10.0000 mg | INTRAVENOUS | Status: AC | PRN

## 2021-04-17 MED ORDER — SODIUM CHLORIDE 0.9 % IV SOLN: 250.0000 mL | INTRAVENOUS | Status: DC | PRN

## 2021-04-17 MED ORDER — ASPIRIN 81 MG PO CHEW
81.0000 mg | CHEWABLE_TABLET | ORAL | Status: AC
  Administered 2021-04-17: 81 mg via ORAL
  Filled 2021-04-17: qty 1

## 2021-04-17 MED ORDER — HEPARIN (PORCINE) IN NACL 1000-0.9 UT/500ML-% IV SOLN
INTRAVENOUS | Status: DC | PRN
  Administered 2021-04-17 (×2): 500 mL

## 2021-04-17 MED ORDER — FENTANYL CITRATE (PF) 100 MCG/2ML IJ SOLN
INTRAMUSCULAR | Status: AC
  Filled 2021-04-17: qty 2

## 2021-04-17 MED ORDER — HEPARIN SODIUM (PORCINE) 1000 UNIT/ML IJ SOLN
INTRAMUSCULAR | Status: AC
  Filled 2021-04-17: qty 1

## 2021-04-17 MED ORDER — IOHEXOL 350 MG/ML SOLN
INTRAVENOUS | Status: DC | PRN
  Administered 2021-04-17: 60 mL

## 2021-04-17 MED ORDER — SODIUM CHLORIDE 0.9 % IV SOLN: INTRAVENOUS | Status: AC

## 2021-04-17 MED ORDER — LIDOCAINE HCL (PF) 1 % IJ SOLN
INTRAMUSCULAR | Status: DC | PRN
  Administered 2021-04-17: 4 mL

## 2021-04-17 MED ORDER — MIDAZOLAM HCL 2 MG/2ML IJ SOLN
INTRAMUSCULAR | Status: AC
  Filled 2021-04-17: qty 2

## 2021-04-17 MED ORDER — SODIUM CHLORIDE 0.9 % WEIGHT BASED INFUSION
3.0000 mL/kg/h | INTRAVENOUS | Status: DC
Start: 2021-04-17 — End: 2021-04-17
  Administered 2021-04-17: 3 mL/kg/h via INTRAVENOUS

## 2021-04-17 MED ORDER — SODIUM CHLORIDE 0.9% FLUSH
3.0000 mL | Freq: Two times a day (BID) | INTRAVENOUS | Status: DC
  Administered 2021-04-17 – 2021-04-18 (×2): 3 mL via INTRAVENOUS

## 2021-04-17 MED ORDER — VERAPAMIL HCL 2.5 MG/ML IV SOLN
INTRAVENOUS | Status: AC
  Filled 2021-04-17: qty 2

## 2021-04-17 SURGICAL SUPPLY — 9 items

## 2021-04-17 NOTE — H&P (View-Only) (Signed)
Progress Note  Patient Name: Lisa Casey Date of Encounter: 04/17/2021  Primary Cardiologist: None   Subjective   No CP overnight, Plan for cath today.  Inpatient Medications    Scheduled Meds:  aspirin EC  81 mg Oral Daily   atorvastatin  40 mg Oral q1800   cycloSPORINE  1 drop Both Eyes BID   insulin aspart  0-15 Units Subcutaneous TID WC   insulin aspart  0-5 Units Subcutaneous QHS   metoprolol tartrate  12.5 mg Oral BID   PARoxetine  20 mg Oral Daily   sodium chloride flush  3 mL Intravenous Q12H   sodium chloride flush  3 mL Intravenous Q12H   Continuous Infusions:  sodium chloride     sodium chloride     sodium chloride 1 mL/kg/hr (04/17/21 0506)   heparin 1,200 Units/hr (04/17/21 0143)   PRN Meds: sodium chloride, sodium chloride, acetaminophen, albuterol, ALPRAZolam, cyclobenzaprine, hydrOXYzine, neomycin-polymyxin b-dexamethasone, nitroGLYCERIN, ondansetron (ZOFRAN) IV, pregabalin, sodium chloride flush, sodium chloride flush   Vital Signs    Vitals:   04/16/21 2323 04/17/21 0300 04/17/21 0356 04/17/21 0500  BP: (!) 148/90  (!) 150/77   Pulse: 65  60   Resp: '15 15 14 14  '$ Temp: 98.6 F (37 C)  98.2 F (36.8 C)   TempSrc: Oral  Oral   SpO2: 95%  99%   Weight:    94 kg  Height:        Intake/Output Summary (Last 24 hours) at 04/17/2021 0725 Last data filed at 04/17/2021 0600 Gross per 24 hour  Intake 407.11 ml  Output --  Net 407.11 ml   Filed Weights   04/15/21 0449 04/17/21 0500  Weight: 96.2 kg 94 kg    Telemetry    SR  - Personally Reviewed  ECG    SR, PAC - Personally Reviewed  Physical Exam   GEN: No acute distress.   Neck: No JVD Cardiac: RRR, no murmurs, rubs, or gallops.  Respiratory: Clear to auscultation bilaterally. GI: Soft, nontender, non-distended  MS: No edema; No deformity. Neuro:  Nonfocal  Psych: Normal affect    Labs    Chemistry Recent Labs  Lab 04/13/21 1018 04/15/21 0511 04/16/21 0051  NA 141 137  138  K 4.3 3.6 4.1  CL 104 106 105  CO2 '23 24 25  '$ GLUCOSE 106* 156* 115*  BUN '14 18 10  '$ CREATININE 0.78 0.93 0.72  CALCIUM 9.4 8.7* 9.2  PROT 6.9  --   --   ALBUMIN 4.3  --   --   AST 17  --   --   ALT 15  --   --   ALKPHOS 84  --   --   BILITOT 0.7  --   --   GFRNONAA  --  >60 >60  ANIONGAP  --  7 8     Hematology Recent Labs  Lab 04/15/21 0511 04/16/21 0051  WBC 7.4 5.6  RBC 4.20 4.25  HGB 12.8 12.7  HCT 39.4 39.3  MCV 93.8 92.5  MCH 30.5 29.9  MCHC 32.5 32.3  RDW 11.7 12.0  PLT 258 270    Cardiac EnzymesNo results for input(s): TROPONINI in the last 168 hours. No results for input(s): TROPIPOC in the last 168 hours.   BNPNo results for input(s): BNP, PROBNP in the last 168 hours.   DDimer  Recent Labs  Lab 04/15/21 0511  DDIMER 0.78*     Radiology    MR ANGIO HEAD  WO CONTRAST  Result Date: 04/16/2021 CLINICAL DATA:  Initial evaluation for acute headache. EXAM: MRI HEAD WITHOUT CONTRAST MRA HEAD WITHOUT CONTRAST TECHNIQUE: Multiplanar, multi-echo pulse sequences of the brain and surrounding structures were acquired without intravenous contrast. Angiographic images of the Circle of Willis were acquired using MRA technique without intravenous contrast. COMPARISON:  No pertinent prior exam. FINDINGS: MRI HEAD FINDINGS Brain: Cerebral volume within normal limits. No significant cerebral white matter disease for age. No abnormal foci of restricted diffusion to suggest acute or subacute ischemia. Gray-white matter differentiation maintained. No encephalomalacia to suggest chronic cortical infarction. No evidence for acute or chronic intracranial hemorrhage. No mass lesion, midline shift or mass effect. No hydrocephalus or extra-axial fluid collection. Pituitary gland suprasellar region normal. Midline structures intact and normal. Vascular: Major intracranial vascular flow voids are maintained. Skull and upper cervical spine: Craniocervical junction within normal limits.  Bone marrow signal intensity normal. No scalp soft tissue abnormality. Sinuses/Orbits: Globes normal soft tissues within normal limits. Paranasal sinuses are largely clear. Small left mastoid effusion noted, of doubtful significance. Inner ear structures grossly normal. Other: None. MRA HEAD FINDINGS Anterior circulation: Visualized distal cervical segments of the internal carotid arteries are patent with antegrade flow. Petrous, cavernous, and supraclinoid segments patent without stenosis. 4 mm saccular outpouching arising from the cavernous right ICA suspicious for an aneurysm (series 1045, image 8). Prominent vascular infundibulum related to a small right posterior communicating artery noted as well. A1 segments widely patent. Normal anterior communicating artery complex. Anterior cerebral arteries patent to their distal aspects without stenosis. No M1 stenosis or occlusion. Left M1 bifurcates early. Bifurcations M cells within normal limits. Distal MCA branches well perfused and symmetric. Posterior circulation: Both vertebral arteries patent to the vertebrobasilar junction without stenosis. Right vertebral artery slightly dominant. Left PICA patent. Right PICA not seen. Basilar mildly tortuous but widely patent to its distal aspect without stenosis. Superior cerebral arteries patent bilaterally. Both PCAs primarily supplied via the basilar well perfused or distal aspects. Anatomic variants: None significant. IMPRESSION: MRI HEAD IMPRESSION: Normal brain MRI for age. No acute intracranial abnormality. MRA HEAD IMPRESSION: 1. Negative intracranial MRA for large vessel occlusion or hemodynamically significant stenosis. 2. 4 mm cavernous right ICA aneurysm. Electronically Signed   By: Jeannine Boga M.D.   On: 04/16/2021 00:09   MR BRAIN WO CONTRAST  Result Date: 04/16/2021 CLINICAL DATA:  Initial evaluation for acute headache. EXAM: MRI HEAD WITHOUT CONTRAST MRA HEAD WITHOUT CONTRAST TECHNIQUE:  Multiplanar, multi-echo pulse sequences of the brain and surrounding structures were acquired without intravenous contrast. Angiographic images of the Circle of Willis were acquired using MRA technique without intravenous contrast. COMPARISON:  No pertinent prior exam. FINDINGS: MRI HEAD FINDINGS Brain: Cerebral volume within normal limits. No significant cerebral white matter disease for age. No abnormal foci of restricted diffusion to suggest acute or subacute ischemia. Gray-white matter differentiation maintained. No encephalomalacia to suggest chronic cortical infarction. No evidence for acute or chronic intracranial hemorrhage. No mass lesion, midline shift or mass effect. No hydrocephalus or extra-axial fluid collection. Pituitary gland suprasellar region normal. Midline structures intact and normal. Vascular: Major intracranial vascular flow voids are maintained. Skull and upper cervical spine: Craniocervical junction within normal limits. Bone marrow signal intensity normal. No scalp soft tissue abnormality. Sinuses/Orbits: Globes normal soft tissues within normal limits. Paranasal sinuses are largely clear. Small left mastoid effusion noted, of doubtful significance. Inner ear structures grossly normal. Other: None. MRA HEAD FINDINGS Anterior circulation: Visualized distal cervical segments of  the internal carotid arteries are patent with antegrade flow. Petrous, cavernous, and supraclinoid segments patent without stenosis. 4 mm saccular outpouching arising from the cavernous right ICA suspicious for an aneurysm (series 1045, image 8). Prominent vascular infundibulum related to a small right posterior communicating artery noted as well. A1 segments widely patent. Normal anterior communicating artery complex. Anterior cerebral arteries patent to their distal aspects without stenosis. No M1 stenosis or occlusion. Left M1 bifurcates early. Bifurcations M cells within normal limits. Distal MCA branches well  perfused and symmetric. Posterior circulation: Both vertebral arteries patent to the vertebrobasilar junction without stenosis. Right vertebral artery slightly dominant. Left PICA patent. Right PICA not seen. Basilar mildly tortuous but widely patent to its distal aspect without stenosis. Superior cerebral arteries patent bilaterally. Both PCAs primarily supplied via the basilar well perfused or distal aspects. Anatomic variants: None significant. IMPRESSION: MRI HEAD IMPRESSION: Normal brain MRI for age. No acute intracranial abnormality. MRA HEAD IMPRESSION: 1. Negative intracranial MRA for large vessel occlusion or hemodynamically significant stenosis. 2. 4 mm cavernous right ICA aneurysm. Electronically Signed   By: Jeannine Boga M.D.   On: 04/16/2021 00:09   ECHOCARDIOGRAM COMPLETE  Result Date: 04/15/2021    ECHOCARDIOGRAM REPORT   Patient Name:   PERSEIS BARCLAY Casey Date of Exam: 04/15/2021 Medical Rec #:  [id number]        Height:       65.0 in Accession #:    [id number]       Weight:       212.0 lb Date of Birth:  [birth year]        BSA:          2.028 m Patient Age:    54 years         BP:           141/82 mmHg Patient Gender: F                HR:           68 bpm. Exam Location:  Inpatient Procedure: 2D Echo Indications:    chest pain  History:        Patient has no prior history of Echocardiogram examinations.                 Fibromyalgia; Risk Factors:Hypertension.  Sonographer:    Johny Chess Referring Phys: [place]  1. Left ventricular ejection fraction, by estimation, is 65 to 70%. The left ventricle has normal function. The left ventricle has no regional wall motion abnormalities. Left ventricular diastolic parameters were normal.  2. Right ventricular systolic function is normal. The right ventricular size is normal. There is normal pulmonary artery systolic pressure.  3. The mitral valve is normal in structure. Trivial mitral valve regurgitation. No evidence of  mitral stenosis.  4. The aortic valve is tricuspid. Aortic valve regurgitation is not visualized. No aortic stenosis is present.  5. The inferior vena cava is normal in size with greater than 50% respiratory variability, suggesting right atrial pressure of 3 mmHg. FINDINGS  Left Ventricle: Left ventricular ejection fraction, by estimation, is 65 to 70%. The left ventricle has normal function. The left ventricle has no regional wall motion abnormalities. The left ventricular internal cavity size was normal in size. There is  no left ventricular hypertrophy. Left ventricular diastolic parameters were normal. Right Ventricle: The right ventricular size is normal. No increase in right ventricular wall thickness. Right ventricular systolic function is normal. There is normal  pulmonary artery systolic pressure. The tricuspid regurgitant velocity is 2.70 m/s, and  with an assumed right atrial pressure of 3 mmHg, the estimated right ventricular systolic pressure is [id number] mmHg. Left Atrium: Left atrial size was normal in size. Right Atrium: Right atrial size was normal in size. Pericardium: There is no evidence of pericardial effusion. Mitral Valve: The mitral valve is normal in structure. Trivial mitral valve regurgitation. No evidence of mitral valve stenosis. Tricuspid Valve: The tricuspid valve is normal in structure. Tricuspid valve regurgitation is trivial. No evidence of tricuspid stenosis. Aortic Valve: The aortic valve is tricuspid. Aortic valve regurgitation is not visualized. No aortic stenosis is present. Pulmonic Valve: The pulmonic valve was normal in structure. Pulmonic valve regurgitation is not visualized. No evidence of pulmonic stenosis. Aorta: The aortic root is normal in size and structure. Venous: The inferior vena cava is normal in size with greater than 50% respiratory variability, suggesting right atrial pressure of 3 mmHg. IAS/Shunts: No atrial level shunt detected by color flow Doppler.  LEFT  VENTRICLE PLAX 2D LVIDd:         3.80 cm  Diastology LVIDs:         2.10 cm  LV e' medial:    9.90 cm/s LV PW:         0.90 cm  LV E/e' medial:  7.7 LV IVS:        0.70 cm  LV e' lateral:   12.80 cm/s LVOT diam:     1.60 cm  LV E/e' lateral: 5.9 LV SV:         52 LV SV Index:   26 LVOT Area:     2.01 cm  RIGHT VENTRICLE             IVC RV S prime:     14.60 cm/s  IVC diam: 1.60 cm TAPSE (M-mode): 2.2 cm LEFT ATRIUM             Index       RIGHT ATRIUM           Index LA diam:        3.60 cm 1.78 cm/m  RA Area:     11.50 cm LA Vol (A2C):   59.6 ml 29.39 ml/m RA Volume:   23.40 ml  11.54 ml/m LA Vol (A4C):   50.4 ml 24.86 ml/m LA Biplane Vol: 54.6 ml 26.93 ml/m  AORTIC VALVE LVOT Vmax:   110.00 cm/s LVOT Vmean:  77.600 cm/s LVOT VTI:    0.258 m  AORTA Ao Root diam: 2.50 cm Ao Asc diam:  2.80 cm MITRAL VALVE               TRICUSPID VALVE MV Area (PHT): 3.85 cm    TR Peak grad:   29.2 mmHg MV Decel Time: 197 msec    TR Vmax:        270.00 cm/s MV E velocity: 75.80 cm/s MV A velocity: 69.00 cm/s  SHUNTS MV E/A ratio:  1.10        Systemic VTI:  0.26 m                            Systemic Diam: 1.60 cm Skeet Latch MD Electronically signed by Skeet Latch MD Signature Date/Time: 04/15/2021/3:55:54 PM    Final    CT Angio Chest/Abd/Pel for Dissection W and/or Wo Contrast  Result Date: 04/15/2021 CLINICAL DATA:  60 year old female with central chest  and abdominal pain which woke her from sleep. Chest pain and arm numbness. EXAM: CT ANGIOGRAPHY CHEST, ABDOMEN AND PELVIS TECHNIQUE: Non-contrast CT of the chest was initially obtained. Multidetector CT imaging through the chest, abdomen and pelvis was performed using the standard protocol during bolus administration of intravenous contrast. Multiplanar reconstructed images and MIPs were obtained and reviewed to evaluate the vascular anatomy. CONTRAST:  161m OMNIPAQUE IOHEXOL 350 MG/ML SOLN COMPARISON:  CT Abdomen and Pelvis 09/21/2012. Portable chest  radiograph earlier today. FINDINGS: CTA CHEST FINDINGS Cardiovascular: Normal caliber thoracic aorta. Negative for aortic dissection. Cardiac size within normal limits. No pericardial effusion. Central and hilar pulmonary arteries also well contrast enhanced and appear to be patent. Some calcified coronary artery atherosclerosis is evident. Mediastinum/Nodes: Negative. No mediastinal mass or lymphadenopathy. Lungs/Pleura: Atelectatic changes to the major airways which remain patent. Platelike and streaky bilateral lower lobe and lingula opacity more resembles atelectasis than infection. A small right lower lobe lung nodule on series 8, image 68 was present in 2014 and not significantly changed compatible with benign etiology. Similar tiny subpleural left upper lobe nodule on series 8, image 51 is likely postinflammatory. No pleural effusion or consolidation. Musculoskeletal: Mild to moderate scoliosis. No acute osseous abnormality identified. Review of the MIP images confirms the above findings. CTA ABDOMEN AND PELVIS FINDINGS VASCULAR Abdominal aorta is normal. Major aortic branches are patent. Bilateral iliac arteries and visible proximal femoral arteries are normal. Review of the MIP images confirms the above findings. NON-VASCULAR Hepatobiliary: Small gallstones in the neck of the gallbladder. No pericholecystic inflammation. Negative liver. Pancreas: Negative. Spleen: Negative. Adrenals/Urinary Tract: Normal adrenal glands. Best seen on precontrast series 5 there is extensive renal collecting system calcification suggesting medullary nephrocalcinosis and/or developing staghorn renal calculi. There is a 7 mm stone at the right ureteropelvic junction. Mild right hydronephrosis. No perinephric inflammatory stranding. Distal to the stone the right ureter appears decompressed. The left ureter is decompressed, but with indeterminate density throughout the lumen of the left ureter. No excreted IV contrast in the  urinary bladder. The bladder is mildly distended but otherwise appears negative. Stomach/Bowel: Mild gas distended sigmoid. Mild retained stool and redundancy of the large bowel. Mild diverticulosis at the hepatic flexure. No active inflammation. Normal appendix series 7, image 204. Negative terminal ileum. No dilated small bowel. Negative stomach and duodenum. No free air, free fluid, mesenteric inflammation. Lymphatic: No lymphadenopathy. Reproductive: Absent uterus.  Diminutive or absent ovaries. Other: No pelvic free fluid. Musculoskeletal: Chronic degenerative S1 superior endplate sclerosis. No acute osseous abnormality identified. Review of the MIP images confirms the above findings. IMPRESSION: 1. Normal aorta. Central pulmonary arteries appear patent. Some calcified coronary artery atherosclerosis. 2. Abnormal kidney suggesting medullary nephrocalcinosis and/or developing renal staghorn calculi. And a 7 mm calculus at the right ureteropelvic junction appears to be causing mild Right Hydronephrosis. 3. Cholelithiasis but no CT evidence of acute cholecystitis. 4. Streaky lung opacity in the lingula and both lower lobes more resembles atelectasis or scarring than infection. No pleural effusion. 5. Small benign right lower lobe pulmonary nodule. There is a 2nd tiny left upper lobe nodule on series 8, image 51. No follow-up needed if patient is low-risk. Non-contrast chest CT can be considered in 12 months if patient is high-risk. This recommendation follows the consensus statement: Guidelines for Management of Incidental Pulmonary Nodules Detected on CT Images: From the Fleischner Society 2017; Radiology 2017; 284:228-243. Electronically Signed   By: HGenevie AnnM.D.   On: 04/15/2021 08:22  Cardiac Studies  Echo:  1. Left ventricular ejection fraction, by estimation, is 65 to 70%. The  left ventricle has normal function. The left ventricle has no regional  wall motion abnormalities. Left ventricular  diastolic parameters were  normal.   2. Right ventricular systolic function is normal. The right ventricular  size is normal. There is normal pulmonary artery systolic pressure.   3. The mitral valve is normal in structure. Trivial mitral valve  regurgitation. No evidence of mitral stenosis.   4. The aortic valve is tricuspid. Aortic valve regurgitation is not  visualized. No aortic stenosis is present.   5. The inferior vena cava is normal in size with greater than 50%  respiratory variability, suggesting right atrial pressure of 3 mmHg.   Patient Profile     60 y.o. female  with a hx of obesity, anxiety, pre-DM, fibromyalgia, GERD, HTN, historically short PR interval, former brief tobacco use, admitted for NSTEMI   Assessment & Plan   Principal Problem:   NSTEMI (non-ST elevated myocardial infarction) ([place]) Active Problems:   Essential hypertension   Obesity (BMI 30-39.9)   Prediabetes   Fibromyalgia   Headache   Cholelithiasis   Anxiety   NSTEMI -Continue aspirin, statin, IV heparin. -Plan for cardiac catheterization today  - echo wnl - continue metoprolol 12.5 mg BID.  INFORMED CONSENT: I have reviewed the risks, indications, and alternatives to cardiac catheterization, possible angioplasty, and stenting with the patient. Risks include but are not limited to bleeding, infection, vascular injury, stroke, myocardial infarction, arrhythmia, kidney injury, radiation-related injury in the case of prolonged fluoroscopy use, emergency cardiac surgery, and death. The patient understands the risks of serious complication is 1-2 in [id number] with diagnostic cardiac cath and 1-2% or less with angioplasty/stenting.   Remainder per IM.     For questions or updates, please contact [place] Please consult [email or web address] for contact info under        Signed, Elouise Munroe, MD  04/17/2021, 7:25 AM

## 2021-04-17 NOTE — Interval H&P Note (Signed)
History and Physical Interval Note:  04/17/2021 10:42 AM  Lisa Casey  has presented today for surgery, with the diagnosis of NSTEMI.  The various methods of treatment have been discussed with the patient and family. After consideration of risks, benefits and other options for treatment, the patient has consented to  Procedure(s): LEFT HEART CATH AND CORONARY ANGIOGRAPHY (N/A) as a surgical intervention.  The patient's history has been reviewed, patient examined, no change in status, stable for surgery.  I have reviewed the patient's chart and labs.  Questions were answered to the patient's satisfaction.    Cath Lab Visit (complete for each Cath Lab visit)  Clinical Evaluation Leading to the Procedure:   ACS: Yes.    Non-ACS:    Anginal Classification: CCS IV  Anti-ischemic medical therapy: Minimal Therapy (1 class of medications)  Non-Invasive Test Results: No non-invasive testing performed  Prior CABG: No previous CABG        Lauree Chandler

## 2021-04-17 NOTE — Progress Notes (Signed)
Progress Note  Patient Name: Lisa Casey Date of Encounter: 04/17/2021  Primary Cardiologist: None   Subjective   No CP overnight, Plan for cath today.  Inpatient Medications    Scheduled Meds:  aspirin EC  81 mg Oral Daily   atorvastatin  40 mg Oral q1800   cycloSPORINE  1 drop Both Eyes BID   insulin aspart  0-15 Units Subcutaneous TID WC   insulin aspart  0-5 Units Subcutaneous QHS   metoprolol tartrate  12.5 mg Oral BID   PARoxetine  20 mg Oral Daily   sodium chloride flush  3 mL Intravenous Q12H   sodium chloride flush  3 mL Intravenous Q12H   Continuous Infusions:  sodium chloride     sodium chloride     sodium chloride 1 mL/kg/hr (04/17/21 0506)   heparin 1,200 Units/hr (04/17/21 0143)   PRN Meds: sodium chloride, sodium chloride, acetaminophen, albuterol, ALPRAZolam, cyclobenzaprine, hydrOXYzine, neomycin-polymyxin b-dexamethasone, nitroGLYCERIN, ondansetron (ZOFRAN) IV, pregabalin, sodium chloride flush, sodium chloride flush   Vital Signs    Vitals:   04/16/21 2323 04/17/21 0300 04/17/21 0356 04/17/21 0500  BP: (!) 148/90  (!) 150/77   Pulse: 65  60   Resp: '15 15 14 14  '$ Temp: 98.6 F (37 C)  98.2 F (36.8 C)   TempSrc: Oral  Oral   SpO2: 95%  99%   Weight:    94 kg  Height:        Intake/Output Summary (Last 24 hours) at 04/17/2021 0725 Last data filed at 04/17/2021 0600 Gross per 24 hour  Intake 407.11 ml  Output --  Net 407.11 ml   Filed Weights   04/15/21 0449 04/17/21 0500  Weight: 96.2 kg 94 kg    Telemetry    SR  - Personally Reviewed  ECG    SR, PAC - Personally Reviewed  Physical Exam   GEN: No acute distress.   Neck: No JVD Cardiac: RRR, no murmurs, rubs, or gallops.  Respiratory: Clear to auscultation bilaterally. GI: Soft, nontender, non-distended  MS: No edema; No deformity. Neuro:  Nonfocal  Psych: Normal affect    Labs    Chemistry Recent Labs  Lab 04/13/21 1018 04/15/21 0511 04/16/21 0051  NA 141 137  138  K 4.3 3.6 4.1  CL 104 106 105  CO2 '23 24 25  '$ GLUCOSE 106* 156* 115*  BUN '14 18 10  '$ CREATININE 0.78 0.93 0.72  CALCIUM 9.4 8.7* 9.2  PROT 6.9  --   --   ALBUMIN 4.3  --   --   AST 17  --   --   ALT 15  --   --   ALKPHOS 84  --   --   BILITOT 0.7  --   --   GFRNONAA  --  >60 >60  ANIONGAP  --  7 8     Hematology Recent Labs  Lab 04/15/21 0511 04/16/21 0051  WBC 7.4 5.6  RBC 4.20 4.25  HGB 12.8 12.7  HCT 39.4 39.3  MCV 93.8 92.5  MCH 30.5 29.9  MCHC 32.5 32.3  RDW 11.7 12.0  PLT 258 270    Cardiac EnzymesNo results for input(s): TROPONINI in the last 168 hours. No results for input(s): TROPIPOC in the last 168 hours.   BNPNo results for input(s): BNP, PROBNP in the last 168 hours.   DDimer  Recent Labs  Lab 04/15/21 0511  DDIMER 0.78*     Radiology    MR ANGIO HEAD  WO CONTRAST  Result Date: 04/16/2021 CLINICAL DATA:  Initial evaluation for acute headache. EXAM: MRI HEAD WITHOUT CONTRAST MRA HEAD WITHOUT CONTRAST TECHNIQUE: Multiplanar, multi-echo pulse sequences of the brain and surrounding structures were acquired without intravenous contrast. Angiographic images of the Circle of Willis were acquired using MRA technique without intravenous contrast. COMPARISON:  No pertinent prior exam. FINDINGS: MRI HEAD FINDINGS Brain: Cerebral volume within normal limits. No significant cerebral white matter disease for age. No abnormal foci of restricted diffusion to suggest acute or subacute ischemia. Gray-white matter differentiation maintained. No encephalomalacia to suggest chronic cortical infarction. No evidence for acute or chronic intracranial hemorrhage. No mass lesion, midline shift or mass effect. No hydrocephalus or extra-axial fluid collection. Pituitary gland suprasellar region normal. Midline structures intact and normal. Vascular: Major intracranial vascular flow voids are maintained. Skull and upper cervical spine: Craniocervical junction within normal limits.  Bone marrow signal intensity normal. No scalp soft tissue abnormality. Sinuses/Orbits: Globes normal soft tissues within normal limits. Paranasal sinuses are largely clear. Small left mastoid effusion noted, of doubtful significance. Inner ear structures grossly normal. Other: None. MRA HEAD FINDINGS Anterior circulation: Visualized distal cervical segments of the internal carotid arteries are patent with antegrade flow. Petrous, cavernous, and supraclinoid segments patent without stenosis. 4 mm saccular outpouching arising from the cavernous right ICA suspicious for an aneurysm (series 1045, image 8). Prominent vascular infundibulum related to a small right posterior communicating artery noted as well. A1 segments widely patent. Normal anterior communicating artery complex. Anterior cerebral arteries patent to their distal aspects without stenosis. No M1 stenosis or occlusion. Left M1 bifurcates early. Bifurcations M cells within normal limits. Distal MCA branches well perfused and symmetric. Posterior circulation: Both vertebral arteries patent to the vertebrobasilar junction without stenosis. Right vertebral artery slightly dominant. Left PICA patent. Right PICA not seen. Basilar mildly tortuous but widely patent to its distal aspect without stenosis. Superior cerebral arteries patent bilaterally. Both PCAs primarily supplied via the basilar well perfused or distal aspects. Anatomic variants: None significant. IMPRESSION: MRI HEAD IMPRESSION: Normal brain MRI for age. No acute intracranial abnormality. MRA HEAD IMPRESSION: 1. Negative intracranial MRA for large vessel occlusion or hemodynamically significant stenosis. 2. 4 mm cavernous right ICA aneurysm. Electronically Signed   By: Jeannine Boga M.D.   On: 04/16/2021 00:09   MR BRAIN WO CONTRAST  Result Date: 04/16/2021 CLINICAL DATA:  Initial evaluation for acute headache. EXAM: MRI HEAD WITHOUT CONTRAST MRA HEAD WITHOUT CONTRAST TECHNIQUE:  Multiplanar, multi-echo pulse sequences of the brain and surrounding structures were acquired without intravenous contrast. Angiographic images of the Circle of Willis were acquired using MRA technique without intravenous contrast. COMPARISON:  No pertinent prior exam. FINDINGS: MRI HEAD FINDINGS Brain: Cerebral volume within normal limits. No significant cerebral white matter disease for age. No abnormal foci of restricted diffusion to suggest acute or subacute ischemia. Gray-white matter differentiation maintained. No encephalomalacia to suggest chronic cortical infarction. No evidence for acute or chronic intracranial hemorrhage. No mass lesion, midline shift or mass effect. No hydrocephalus or extra-axial fluid collection. Pituitary gland suprasellar region normal. Midline structures intact and normal. Vascular: Major intracranial vascular flow voids are maintained. Skull and upper cervical spine: Craniocervical junction within normal limits. Bone marrow signal intensity normal. No scalp soft tissue abnormality. Sinuses/Orbits: Globes normal soft tissues within normal limits. Paranasal sinuses are largely clear. Small left mastoid effusion noted, of doubtful significance. Inner ear structures grossly normal. Other: None. MRA HEAD FINDINGS Anterior circulation: Visualized distal cervical segments of  the internal carotid arteries are patent with antegrade flow. Petrous, cavernous, and supraclinoid segments patent without stenosis. 4 mm saccular outpouching arising from the cavernous right ICA suspicious for an aneurysm (series 1045, image 8). Prominent vascular infundibulum related to a small right posterior communicating artery noted as well. A1 segments widely patent. Normal anterior communicating artery complex. Anterior cerebral arteries patent to their distal aspects without stenosis. No M1 stenosis or occlusion. Left M1 bifurcates early. Bifurcations M cells within normal limits. Distal MCA branches well  perfused and symmetric. Posterior circulation: Both vertebral arteries patent to the vertebrobasilar junction without stenosis. Right vertebral artery slightly dominant. Left PICA patent. Right PICA not seen. Basilar mildly tortuous but widely patent to its distal aspect without stenosis. Superior cerebral arteries patent bilaterally. Both PCAs primarily supplied via the basilar well perfused or distal aspects. Anatomic variants: None significant. IMPRESSION: MRI HEAD IMPRESSION: Normal brain MRI for age. No acute intracranial abnormality. MRA HEAD IMPRESSION: 1. Negative intracranial MRA for large vessel occlusion or hemodynamically significant stenosis. 2. 4 mm cavernous right ICA aneurysm. Electronically Signed   By: Jeannine Boga M.D.   On: 04/16/2021 00:09   ECHOCARDIOGRAM COMPLETE  Result Date: 04/15/2021    ECHOCARDIOGRAM REPORT   Patient Name:   OVIDA MATHE Casey Date of Exam: 04/15/2021 Medical Rec #:  [id number]        Height:       65.0 in Accession #:    [id number]       Weight:       212.0 lb Date of Birth:  June 23, 1961        BSA:          2.028 m Patient Age:    60 years         BP:           141/82 mmHg Patient Gender: F                HR:           68 bpm. Exam Location:  Inpatient Procedure: 2D Echo Indications:    chest pain  History:        Patient has no prior history of Echocardiogram examinations.                 Fibromyalgia; Risk Factors:Hypertension.  Sonographer:    Johny Chess Referring Phys: [place]  1. Left ventricular ejection fraction, by estimation, is 65 to 70%. The left ventricle has normal function. The left ventricle has no regional wall motion abnormalities. Left ventricular diastolic parameters were normal.  2. Right ventricular systolic function is normal. The right ventricular size is normal. There is normal pulmonary artery systolic pressure.  3. The mitral valve is normal in structure. Trivial mitral valve regurgitation. No evidence of  mitral stenosis.  4. The aortic valve is tricuspid. Aortic valve regurgitation is not visualized. No aortic stenosis is present.  5. The inferior vena cava is normal in size with greater than 50% respiratory variability, suggesting right atrial pressure of 3 mmHg. FINDINGS  Left Ventricle: Left ventricular ejection fraction, by estimation, is 65 to 70%. The left ventricle has normal function. The left ventricle has no regional wall motion abnormalities. The left ventricular internal cavity size was normal in size. There is  no left ventricular hypertrophy. Left ventricular diastolic parameters were normal. Right Ventricle: The right ventricular size is normal. No increase in right ventricular wall thickness. Right ventricular systolic function is normal. There is normal  pulmonary artery systolic pressure. The tricuspid regurgitant velocity is 2.70 m/s, and  with an assumed right atrial pressure of 3 mmHg, the estimated right ventricular systolic pressure is [id number] mmHg. Left Atrium: Left atrial size was normal in size. Right Atrium: Right atrial size was normal in size. Pericardium: There is no evidence of pericardial effusion. Mitral Valve: The mitral valve is normal in structure. Trivial mitral valve regurgitation. No evidence of mitral valve stenosis. Tricuspid Valve: The tricuspid valve is normal in structure. Tricuspid valve regurgitation is trivial. No evidence of tricuspid stenosis. Aortic Valve: The aortic valve is tricuspid. Aortic valve regurgitation is not visualized. No aortic stenosis is present. Pulmonic Valve: The pulmonic valve was normal in structure. Pulmonic valve regurgitation is not visualized. No evidence of pulmonic stenosis. Aorta: The aortic root is normal in size and structure. Venous: The inferior vena cava is normal in size with greater than 50% respiratory variability, suggesting right atrial pressure of 3 mmHg. IAS/Shunts: No atrial level shunt detected by color flow Doppler.  LEFT  VENTRICLE PLAX 2D LVIDd:         3.80 cm  Diastology LVIDs:         2.10 cm  LV e' medial:    9.90 cm/s LV PW:         0.90 cm  LV E/e' medial:  7.7 LV IVS:        0.70 cm  LV e' lateral:   12.80 cm/s LVOT diam:     1.60 cm  LV E/e' lateral: 5.9 LV SV:         52 LV SV Index:   26 LVOT Area:     2.01 cm  RIGHT VENTRICLE             IVC RV S prime:     14.60 cm/s  IVC diam: 1.60 cm TAPSE (M-mode): 2.2 cm LEFT ATRIUM             Index       RIGHT ATRIUM           Index LA diam:        3.60 cm 1.78 cm/m  RA Area:     11.50 cm LA Vol (A2C):   59.6 ml 29.39 ml/m RA Volume:   23.40 ml  11.54 ml/m LA Vol (A4C):   50.4 ml 24.86 ml/m LA Biplane Vol: 54.6 ml 26.93 ml/m  AORTIC VALVE LVOT Vmax:   110.00 cm/s LVOT Vmean:  77.600 cm/s LVOT VTI:    0.258 m  AORTA Ao Root diam: 2.50 cm Ao Asc diam:  2.80 cm MITRAL VALVE               TRICUSPID VALVE MV Area (PHT): 3.85 cm    TR Peak grad:   29.2 mmHg MV Decel Time: 197 msec    TR Vmax:        270.00 cm/s MV E velocity: 75.80 cm/s MV A velocity: 69.00 cm/s  SHUNTS MV E/A ratio:  1.10        Systemic VTI:  0.26 m                            Systemic Diam: 1.60 cm Skeet Latch MD Electronically signed by Skeet Latch MD Signature Date/Time: 04/15/2021/3:55:54 PM    Final    CT Angio Chest/Abd/Pel for Dissection W and/or Wo Contrast  Result Date: 04/15/2021 CLINICAL DATA:  60 year old female with central chest  and abdominal pain which woke her from sleep. Chest pain and arm numbness. EXAM: CT ANGIOGRAPHY CHEST, ABDOMEN AND PELVIS TECHNIQUE: Non-contrast CT of the chest was initially obtained. Multidetector CT imaging through the chest, abdomen and pelvis was performed using the standard protocol during bolus administration of intravenous contrast. Multiplanar reconstructed images and MIPs were obtained and reviewed to evaluate the vascular anatomy. CONTRAST:  164m OMNIPAQUE IOHEXOL 350 MG/ML SOLN COMPARISON:  CT Abdomen and Pelvis 09/21/2012. Portable chest  radiograph earlier today. FINDINGS: CTA CHEST FINDINGS Cardiovascular: Normal caliber thoracic aorta. Negative for aortic dissection. Cardiac size within normal limits. No pericardial effusion. Central and hilar pulmonary arteries also well contrast enhanced and appear to be patent. Some calcified coronary artery atherosclerosis is evident. Mediastinum/Nodes: Negative. No mediastinal mass or lymphadenopathy. Lungs/Pleura: Atelectatic changes to the major airways which remain patent. Platelike and streaky bilateral lower lobe and lingula opacity more resembles atelectasis than infection. A small right lower lobe lung nodule on series 8, image 68 was present in 2014 and not significantly changed compatible with benign etiology. Similar tiny subpleural left upper lobe nodule on series 8, image 51 is likely postinflammatory. No pleural effusion or consolidation. Musculoskeletal: Mild to moderate scoliosis. No acute osseous abnormality identified. Review of the MIP images confirms the above findings. CTA ABDOMEN AND PELVIS FINDINGS VASCULAR Abdominal aorta is normal. Major aortic branches are patent. Bilateral iliac arteries and visible proximal femoral arteries are normal. Review of the MIP images confirms the above findings. NON-VASCULAR Hepatobiliary: Small gallstones in the neck of the gallbladder. No pericholecystic inflammation. Negative liver. Pancreas: Negative. Spleen: Negative. Adrenals/Urinary Tract: Normal adrenal glands. Best seen on precontrast series 5 there is extensive renal collecting system calcification suggesting medullary nephrocalcinosis and/or developing staghorn renal calculi. There is a 7 mm stone at the right ureteropelvic junction. Mild right hydronephrosis. No perinephric inflammatory stranding. Distal to the stone the right ureter appears decompressed. The left ureter is decompressed, but with indeterminate density throughout the lumen of the left ureter. No excreted IV contrast in the  urinary bladder. The bladder is mildly distended but otherwise appears negative. Stomach/Bowel: Mild gas distended sigmoid. Mild retained stool and redundancy of the large bowel. Mild diverticulosis at the hepatic flexure. No active inflammation. Normal appendix series 7, image 204. Negative terminal ileum. No dilated small bowel. Negative stomach and duodenum. No free air, free fluid, mesenteric inflammation. Lymphatic: No lymphadenopathy. Reproductive: Absent uterus.  Diminutive or absent ovaries. Other: No pelvic free fluid. Musculoskeletal: Chronic degenerative S1 superior endplate sclerosis. No acute osseous abnormality identified. Review of the MIP images confirms the above findings. IMPRESSION: 1. Normal aorta. Central pulmonary arteries appear patent. Some calcified coronary artery atherosclerosis. 2. Abnormal kidney suggesting medullary nephrocalcinosis and/or developing renal staghorn calculi. And a 7 mm calculus at the right ureteropelvic junction appears to be causing mild Right Hydronephrosis. 3. Cholelithiasis but no CT evidence of acute cholecystitis. 4. Streaky lung opacity in the lingula and both lower lobes more resembles atelectasis or scarring than infection. No pleural effusion. 5. Small benign right lower lobe pulmonary nodule. There is a 2nd tiny left upper lobe nodule on series 8, image 51. No follow-up needed if patient is low-risk. Non-contrast chest CT can be considered in 12 months if patient is high-risk. This recommendation follows the consensus statement: Guidelines for Management of Incidental Pulmonary Nodules Detected on CT Images: From the Fleischner Society 2017; Radiology 2017; 284:228-243. Electronically Signed   By: HGenevie AnnM.D.   On: 04/15/2021 08:22  Cardiac Studies  Echo:  1. Left ventricular ejection fraction, by estimation, is 65 to 70%. The  left ventricle has normal function. The left ventricle has no regional  wall motion abnormalities. Left ventricular  diastolic parameters were  normal.   2. Right ventricular systolic function is normal. The right ventricular  size is normal. There is normal pulmonary artery systolic pressure.   3. The mitral valve is normal in structure. Trivial mitral valve  regurgitation. No evidence of mitral stenosis.   4. The aortic valve is tricuspid. Aortic valve regurgitation is not  visualized. No aortic stenosis is present.   5. The inferior vena cava is normal in size with greater than 50%  respiratory variability, suggesting right atrial pressure of 3 mmHg.   Patient Profile     61 y.o. female  with a hx of obesity, anxiety, pre-DM, fibromyalgia, GERD, HTN, historically short PR interval, former brief tobacco use, admitted for NSTEMI   Assessment & Plan   Principal Problem:   NSTEMI (non-ST elevated myocardial infarction) ([place]) Active Problems:   Essential hypertension   Obesity (BMI 30-39.9)   Prediabetes   Fibromyalgia   Headache   Cholelithiasis   Anxiety   NSTEMI -Continue aspirin, statin, IV heparin. -Plan for cardiac catheterization today  - echo wnl - continue metoprolol 12.5 mg BID.  INFORMED CONSENT: I have reviewed the risks, indications, and alternatives to cardiac catheterization, possible angioplasty, and stenting with the patient. Risks include but are not limited to bleeding, infection, vascular injury, stroke, myocardial infarction, arrhythmia, kidney injury, radiation-related injury in the case of prolonged fluoroscopy use, emergency cardiac surgery, and death. The patient understands the risks of serious complication is 1-2 in [id number] with diagnostic cardiac cath and 1-2% or less with angioplasty/stenting.   Remainder per IM.     For questions or updates, please contact [place] Please consult [email or web address] for contact info under        Signed, Elouise Munroe, MD  04/17/2021, 7:25 AM

## 2021-04-17 NOTE — Progress Notes (Signed)
PROGRESS NOTE    Lisa Casey  [id number] DOB: 1961/08/10 DOA: 04/15/2021 PCP: [name], FNP    Chief Complaint  Patient presents with   Chest Pain    Brief Narrative:  Lisa Casey is a 60 y.o. female with medical history significant of fibromyalgia; anxiety; chronic back pain; and HTN presenting with chest pain. Chest pain resolved.  CATH does not show any obstructive CAD.   Assessment & Plan:   Principal Problem:   NSTEMI (non-ST elevated myocardial infarction) ([place]) Active Problems:   Essential hypertension   Obesity (BMI 30-39.9)   Prediabetes   Fibromyalgia   Headache   Cholelithiasis   Anxiety   Elevated troponin   NSTEMI: -chest pain appears to have improved.  Echo unremarkable Cardiology consulted , Underwent cath which desn't to show any obstructive CAD.  Plan for cardiac MRI in am.      Essential hypertension Well controlled.    Type 2 diabetes mellitus Last A1c was 6.4 Continue with sliding scale insulin. Resume metformin on discharge.    Headache Resolved , MRI and MRA unremarkable.     Anxiety Prn xanax in addition to vistiril.    Body mass index is 34.49 kg/m. Outpatient follow up .    Fibromyalgia Continue with home medications.    DVT prophylaxis: Heparin Code Status: (Full Code) Family Communication: (none at bedside) Disposition:   Status is: Inpatient  Remains inpatient appropriate because:Ongoing diagnostic testing needed not appropriate for outpatient work up and IV treatments appropriate due to intensity of illness or inability to take PO  Dispo: The patient is from: Home              Anticipated d/c is to: Home              Patient currently is not medically stable to d/c.   Difficult to place patient No       Consultants:  cardiology  Procedures: echo MRI brain MRA head and neck.  Antimicrobials: none.   Subjective: No chest pain or short ness of breath.   Objective: Vitals:    04/17/21 1400 04/17/21 1430 04/17/21 1500 04/17/21 1636  BP: 138/88 138/86 (!) 122/105 123/70  Pulse: (!) 57 (!) 56 63 61  Resp:      Temp:    98.9 F (37.2 C)  TempSrc:    Oral  SpO2: 93% 91% 96% 93%  Weight:      Height:        Intake/Output Summary (Last 24 hours) at 04/17/2021 1717 Last data filed at 04/17/2021 0600 Gross per 24 hour  Intake 406.11 ml  Output --  Net 406.11 ml    Filed Weights   04/15/21 0449 04/17/21 0500  Weight: 96.2 kg 94 kg    Examination:  General exam: alert and comfortable.  Respiratory system: air entry fair, no wheezing heard.  Cardiovascular system: S1 & S2 heard, RRR, no JVD, no pedal edema.  Gastrointestinal system: Abdomen is soft,NT ND BS+ Central nervous system: Alert and oriented, non focal Extremities: no pedal edema.  Skin: No rashes seen  Psychiatry: Mood is appropriate.     Data Reviewed: I have personally reviewed following labs and imaging studies  CBC: Recent Labs  Lab 04/15/21 0511 04/16/21 0051  WBC 7.4 5.6  HGB 12.8 12.7  HCT 39.4 39.3  MCV 93.8 92.5  PLT 258 270     Basic Metabolic Panel: Recent Labs  Lab 04/13/21 1018 04/15/21 0511 04/15/21 1503 04/16/21 0051  NA 141 137  --  138  K 4.3 3.6  --  4.1  CL 104 106  --  105  CO2 23 24  --  25  GLUCOSE 106* 156*  --  115*  BUN 14 18  --  10  CREATININE 0.78 0.93  --  0.72  CALCIUM 9.4 8.7*  --  9.2  MG  --   --  2.0  --      GFR: Estimated Creatinine Clearance: 84.8 mL/min (by C-G formula based on SCr of 0.72 mg/dL).  Liver Function Tests: Recent Labs  Lab 04/13/21 1018  AST 17  ALT 15  ALKPHOS 84  BILITOT 0.7  PROT 6.9  ALBUMIN 4.3     CBG: Recent Labs  Lab 04/16/21 2053 04/17/21 0354 04/17/21 0747 04/17/21 1136 04/17/21 1639  GLUCAP 113* 125* 113* 104* 96      Recent Results (from the past 240 hour(s))  Resp Panel by RT-PCR (Flu A&B, Covid) Nasopharyngeal Swab     Status: None   Collection Time: 04/15/21  8:56 AM    Specimen: Nasopharyngeal Swab; Nasopharyngeal(NP) swabs in vial transport medium  Result Value Ref Range Status   SARS Coronavirus 2 by RT PCR NEGATIVE NEGATIVE Final    Comment: (NOTE) SARS-CoV-2 target nucleic acids are NOT DETECTED.  The SARS-CoV-2 RNA is generally detectable in upper respiratory specimens during the acute phase of infection. The lowest concentration of SARS-CoV-2 viral copies this assay can detect is 138 copies/mL. A negative result does not preclude SARS-Cov-2 infection and should not be used as the sole basis for treatment or other patient management decisions. A negative result may occur with  improper specimen collection/handling, submission of specimen other than nasopharyngeal swab, presence of viral mutation(s) within the areas targeted by this assay, and inadequate number of viral copies(<138 copies/mL). A negative result must be combined with clinical observations, patient history, and epidemiological information. The expected result is Negative.  Fact Sheet for Patients:  EntrepreneurPulse.com.au  Fact Sheet for Healthcare Providers:  IncredibleEmployment.be  This test is no t yet approved or cleared by the [place] FDA and  has been authorized for detection and/or diagnosis of SARS-CoV-2 by FDA under an Emergency Use Authorization (EUA). This EUA will remain  in effect (meaning this test can be used) for the duration of the COVID-19 declaration under Section 564(b)(1) of the Act, 21 U.S.C.section 360bbb-3(b)(1), unless the authorization is terminated  or revoked sooner.       Influenza A by PCR NEGATIVE NEGATIVE Final   Influenza B by PCR NEGATIVE NEGATIVE Final    Comment: (NOTE) The Xpert Xpress SARS-CoV-2/FLU/RSV plus assay is intended as an aid in the diagnosis of influenza from Nasopharyngeal swab specimens and should not be used as a sole basis for treatment. Nasal washings and aspirates are  unacceptable for Xpert Xpress SARS-CoV-2/FLU/RSV testing.  Fact Sheet for Patients: EntrepreneurPulse.com.au  Fact Sheet for Healthcare Providers: IncredibleEmployment.be  This test is not yet approved or cleared by the [place] FDA and has been authorized for detection and/or diagnosis of SARS-CoV-2 by FDA under an Emergency Use Authorization (EUA). This EUA will remain in effect (meaning this test can be used) for the duration of the COVID-19 declaration under Section 564(b)(1) of the Act, 21 U.S.C. section 360bbb-3(b)(1), unless the authorization is terminated or revoked.  Performed at [facility], [place] [address], [place], [place] [address]           Radiology Studies: MR ANGIO  HEAD WO CONTRAST  Result Date: 04/16/2021 CLINICAL DATA:  Initial evaluation for acute headache. EXAM: MRI HEAD WITHOUT CONTRAST MRA HEAD WITHOUT CONTRAST TECHNIQUE: Multiplanar, multi-echo pulse sequences of the brain and surrounding structures were acquired without intravenous contrast. Angiographic images of the Circle of Willis were acquired using MRA technique without intravenous contrast. COMPARISON:  No pertinent prior exam. FINDINGS: MRI HEAD FINDINGS Brain: Cerebral volume within normal limits. No significant cerebral white matter disease for age. No abnormal foci of restricted diffusion to suggest acute or subacute ischemia. Gray-white matter differentiation maintained. No encephalomalacia to suggest chronic cortical infarction. No evidence for acute or chronic intracranial hemorrhage. No mass lesion, midline shift or mass effect. No hydrocephalus or extra-axial fluid collection. Pituitary gland suprasellar region normal. Midline structures intact and normal. Vascular: Major intracranial vascular flow voids are maintained. Skull and upper cervical spine: Craniocervical junction within normal limits. Bone marrow signal intensity normal. No scalp soft  tissue abnormality. Sinuses/Orbits: Globes normal soft tissues within normal limits. Paranasal sinuses are largely clear. Small left mastoid effusion noted, of doubtful significance. Inner ear structures grossly normal. Other: None. MRA HEAD FINDINGS Anterior circulation: Visualized distal cervical segments of the internal carotid arteries are patent with antegrade flow. Petrous, cavernous, and supraclinoid segments patent without stenosis. 4 mm saccular outpouching arising from the cavernous right ICA suspicious for an aneurysm (series 1045, image 8). Prominent vascular infundibulum related to a small right posterior communicating artery noted as well. A1 segments widely patent. Normal anterior communicating artery complex. Anterior cerebral arteries patent to their distal aspects without stenosis. No M1 stenosis or occlusion. Left M1 bifurcates early. Bifurcations M cells within normal limits. Distal MCA branches well perfused and symmetric. Posterior circulation: Both vertebral arteries patent to the vertebrobasilar junction without stenosis. Right vertebral artery slightly dominant. Left PICA patent. Right PICA not seen. Basilar mildly tortuous but widely patent to its distal aspect without stenosis. Superior cerebral arteries patent bilaterally. Both PCAs primarily supplied via the basilar well perfused or distal aspects. Anatomic variants: None significant. IMPRESSION: MRI HEAD IMPRESSION: Normal brain MRI for age. No acute intracranial abnormality. MRA HEAD IMPRESSION: 1. Negative intracranial MRA for large vessel occlusion or hemodynamically significant stenosis. 2. 4 mm cavernous right ICA aneurysm. Electronically Signed   By: Jeannine Boga M.D.   On: 04/16/2021 00:09   MR BRAIN WO CONTRAST  Result Date: 04/16/2021 CLINICAL DATA:  Initial evaluation for acute headache. EXAM: MRI HEAD WITHOUT CONTRAST MRA HEAD WITHOUT CONTRAST TECHNIQUE: Multiplanar, multi-echo pulse sequences of the brain and  surrounding structures were acquired without intravenous contrast. Angiographic images of the Circle of Willis were acquired using MRA technique without intravenous contrast. COMPARISON:  No pertinent prior exam. FINDINGS: MRI HEAD FINDINGS Brain: Cerebral volume within normal limits. No significant cerebral white matter disease for age. No abnormal foci of restricted diffusion to suggest acute or subacute ischemia. Gray-white matter differentiation maintained. No encephalomalacia to suggest chronic cortical infarction. No evidence for acute or chronic intracranial hemorrhage. No mass lesion, midline shift or mass effect. No hydrocephalus or extra-axial fluid collection. Pituitary gland suprasellar region normal. Midline structures intact and normal. Vascular: Major intracranial vascular flow voids are maintained. Skull and upper cervical spine: Craniocervical junction within normal limits. Bone marrow signal intensity normal. No scalp soft tissue abnormality. Sinuses/Orbits: Globes normal soft tissues within normal limits. Paranasal sinuses are largely clear. Small left mastoid effusion noted, of doubtful significance. Inner ear structures grossly normal. Other: None. MRA HEAD FINDINGS Anterior circulation: Visualized distal cervical segments  of the internal carotid arteries are patent with antegrade flow. Petrous, cavernous, and supraclinoid segments patent without stenosis. 4 mm saccular outpouching arising from the cavernous right ICA suspicious for an aneurysm (series 1045, image 8). Prominent vascular infundibulum related to a small right posterior communicating artery noted as well. A1 segments widely patent. Normal anterior communicating artery complex. Anterior cerebral arteries patent to their distal aspects without stenosis. No M1 stenosis or occlusion. Left M1 bifurcates early. Bifurcations M cells within normal limits. Distal MCA branches well perfused and symmetric. Posterior circulation: Both  vertebral arteries patent to the vertebrobasilar junction without stenosis. Right vertebral artery slightly dominant. Left PICA patent. Right PICA not seen. Basilar mildly tortuous but widely patent to its distal aspect without stenosis. Superior cerebral arteries patent bilaterally. Both PCAs primarily supplied via the basilar well perfused or distal aspects. Anatomic variants: None significant. IMPRESSION: MRI HEAD IMPRESSION: Normal brain MRI for age. No acute intracranial abnormality. MRA HEAD IMPRESSION: 1. Negative intracranial MRA for large vessel occlusion or hemodynamically significant stenosis. 2. 4 mm cavernous right ICA aneurysm. Electronically Signed   By: Jeannine Boga M.D.   On: 04/16/2021 00:09   CARDIAC CATHETERIZATION  Result Date: 04/17/2021 No angiographic evidence of CAD LVEDP 15-17 mmHg Recommendations: No further ischemic testing. Consider coronary vasospasm as the etiology of her chest pain and elevated troponin.        Scheduled Meds:  aspirin EC  81 mg Oral Daily   atorvastatin  40 mg Oral q1800   cycloSPORINE  1 drop Both Eyes BID   insulin aspart  0-15 Units Subcutaneous TID WC   insulin aspart  0-5 Units Subcutaneous QHS   metoprolol tartrate  12.5 mg Oral BID   PARoxetine  20 mg Oral Daily   sodium chloride flush  3 mL Intravenous Q12H   sodium chloride flush  3 mL Intravenous Q12H   sodium chloride flush  3 mL Intravenous Q12H   Continuous Infusions:  sodium chloride     sodium chloride       LOS: 2 days       Hosie Poisson, MD [facility]   To contact the attending provider between 7A-7P or the covering provider during after hours 7P-7A, please log into the web site [email or web address] and access using universal [place] password for that web site. If you do not have the password, please call the hospital operator.  04/17/2021, 5:17 PM

## 2021-04-18 ENCOUNTER — Encounter: Payer: Self-pay | Admitting: Nurse Practitioner

## 2021-04-18 ENCOUNTER — Inpatient Hospital Stay (HOSPITAL_COMMUNITY): Payer: BC Managed Care – PPO

## 2021-04-18 ENCOUNTER — Telehealth: Payer: Self-pay

## 2021-04-18 DIAGNOSIS — R778 Other specified abnormalities of plasma proteins: Secondary | ICD-10-CM

## 2021-04-18 DIAGNOSIS — R079 Chest pain, unspecified: Secondary | ICD-10-CM

## 2021-04-18 DIAGNOSIS — I214 Non-ST elevation (NSTEMI) myocardial infarction: Secondary | ICD-10-CM | POA: Diagnosis not present

## 2021-04-18 LAB — BASIC METABOLIC PANEL
Anion gap: 10 (ref 5–15)
BUN: 17 mg/dL (ref 6–20)
CO2: 23 mmol/L (ref 22–32)
Calcium: 9.5 mg/dL (ref 8.9–10.3)
Chloride: 103 mmol/L (ref 98–111)
Creatinine, Ser: 0.91 mg/dL (ref 0.44–1.00)
GFR, Estimated: >60 mL/min (ref >60)
Glucose, Bld: 112 mg/dL — ABNORMAL HIGH (ref 70–99)
Potassium: 3.8 mmol/L (ref 3.5–5.1)
Sodium: 136 mmol/L (ref 135–145)

## 2021-04-18 LAB — CBC
HCT: 40.2 % (ref 36.0–46.0)
Hemoglobin: 13.7 g/dL (ref 12.0–15.0)
MCH: 30.6 pg (ref 26.0–34.0)
MCHC: 34.1 g/dL (ref 30.0–36.0)
MCV: 89.9 fL (ref 80.0–100.0)
Platelets: 271 10*3/uL (ref 150–400)
RBC: 4.47 MIL/uL (ref 3.87–5.11)
RDW: 11.6 % (ref 11.5–15.5)
WBC: 5.9 10*3/uL (ref 4.0–10.5)
nRBC: 0 % (ref 0.0–0.2)

## 2021-04-18 LAB — GLUCOSE, CAPILLARY
Glucose-Capillary: 114 mg/dL — ABNORMAL HIGH (ref 70–99)
Glucose-Capillary: 111 mg/dL — ABNORMAL HIGH (ref 70–99)
Glucose-Capillary: 89 mg/dL (ref 70–99)

## 2021-04-18 LAB — TSH: TSH: 2.237 u[IU]/mL (ref 0.350–4.500)

## 2021-04-18 MED ORDER — GADOBUTROL 1 MMOL/ML IV SOLN
13.0000 mL | Freq: Once | INTRAVENOUS | Status: AC | PRN
  Administered 2021-04-18: 13 mL via INTRAVENOUS

## 2021-04-18 MED ORDER — CLOPIDOGREL BISULFATE 75 MG PO TABS: 75.0000 mg | ORAL_TABLET | Freq: Every day | ORAL | 3 refills | Status: DC

## 2021-04-18 MED ORDER — CLOPIDOGREL BISULFATE 75 MG PO TABS
75.0000 mg | ORAL_TABLET | Freq: Every day | ORAL | Status: DC
  Administered 2021-04-18: 75 mg via ORAL
  Filled 2021-04-18: qty 1

## 2021-04-18 MED ORDER — AMLODIPINE BESYLATE 2.5 MG PO TABS
2.5000 mg | ORAL_TABLET | Freq: Every day | ORAL | Status: DC
  Administered 2021-04-18: 2.5 mg via ORAL
  Filled 2021-04-18: qty 1

## 2021-04-18 NOTE — Telephone Encounter (Signed)
Transition Care Management Follow-u//p Telephone Call Date of discharge and from where: 04/18/2021 [facility]  How have you been since you were released from the hospital? Pt states she is doing a little better.  Any questions or concerns? No  Items Reviewed: Did the pt receive and understand the discharge instructions provided? Yes  Medications obtained and verified? Yes  Other? No  Any new allergies since your discharge? No  Dietary orders reviewed? Yes Do you have support at home? Yes   Home Care and Equipment/Supplies: Were home health services ordered? not applicable If so, what is the name of the agency? N/a   Has the agency set up a time to come to the patient's home? not applicable Were any new equipment or medical supplies ordered?  No What is the name of the medical supply agency? N/a  Were you able to get the supplies/equipment? not applicable Do you have any questions related to the use of the equipment or supplies? No  Functional Questionnaire: (I = Independent and D = Dependent) ADLs: I    Bathing/Dressing- i  Meal Prep- i  Eating- i  Maintaining continence- i  Transferring/Ambulation- i  Managing Meds- i  Follow up appointments reviewed:  PCP Hospital f/u appt confirmed? Yes  Scheduled to see Raman Ghumman on 04/20/2021 @ 3:30 [facility] f/u appt confirmed? No  Scheduled to see n/a  on n/a  @ n/a . Are transportation arrangements needed? No  If their condition worsens, is the pt aware to call PCP or go to the Emergency Dept.? Yes Was the patient provided with contact information for the PCP's office or ED? Yes Was to pt encouraged to call back with questions or concerns? Yes

## 2021-04-18 NOTE — Progress Notes (Signed)
RN provided patient with verbal discharge instructions. Paper copy of discharge summary provided to patient. RN answered all questions. IV's removed.  VSS at discharge. Patient belonging sent with patient.  RN d/c patient via wheelchair through [employer] entrance to private vehicle.

## 2021-04-18 NOTE — Progress Notes (Signed)
Progress Note  Patient Name: Lisa Casey Date of Encounter: 04/18/2021  Primary Cardiologist: None   Subjective   No epicardial coronary disease on cath.  Feeling well overall today after MRI.  Mri shows very small area of inferolateral apical infarct, likely from small distal vessel occlusion - suspect secondary to diabetes.   Inpatient Medications    Scheduled Meds:  aspirin EC  81 mg Oral Daily   atorvastatin  40 mg Oral q1800   clopidogrel  75 mg Oral Daily   cycloSPORINE  1 drop Both Eyes BID   insulin aspart  0-15 Units Subcutaneous TID WC   insulin aspart  0-5 Units Subcutaneous QHS   metoprolol tartrate  12.5 mg Oral BID   PARoxetine  20 mg Oral Daily   sodium chloride flush  3 mL Intravenous Q12H   sodium chloride flush  3 mL Intravenous Q12H   sodium chloride flush  3 mL Intravenous Q12H   Continuous Infusions:  sodium chloride     sodium chloride     PRN Meds: sodium chloride, sodium chloride, acetaminophen, albuterol, ALPRAZolam, cyclobenzaprine, hydrOXYzine, neomycin-polymyxin b-dexamethasone, nitroGLYCERIN, ondansetron (ZOFRAN) IV, pregabalin, sodium chloride flush, sodium chloride flush   Vital Signs    Vitals:   04/17/21 2326 04/18/21 0300 04/18/21 0416 04/18/21 0833  BP: (!) 148/95  131/80 (!) 149/78  Pulse: 68  61   Resp: '16 17 13 13  '$ Temp: 98.4 F (36.9 C)  98.7 F (37.1 C) 98.5 F (36.9 C)  TempSrc: Oral  Oral Oral  SpO2: 98%  99%   Weight:      Height:       No intake or output data in the 24 hours ending 04/18/21 1045  Filed Weights   04/15/21 0449 04/17/21 0500  Weight: 96.2 kg 94 kg    Telemetry    SR  - Personally Reviewed  ECG    SR, PAC - Personally Reviewed  Physical Exam   GEN: No acute distress.   Neck: No JVD Cardiac: RRR, no murmurs, rubs, or gallops.  Respiratory: Clear to auscultation bilaterally. GI: Soft, nontender, non-distended  MS: No edema; No deformity. Neuro:  Nonfocal  Psych: Normal affect     Labs    Chemistry Recent Labs  Lab 04/13/21 1018 04/15/21 0511 04/16/21 0051 04/18/21 0127  NA 141 137 138 136  K 4.3 3.6 4.1 3.8  CL 104 106 105 103  CO2 '23 24 25 23  '$ GLUCOSE 106* 156* 115* 112*  BUN '14 18 10 17  '$ CREATININE 0.78 0.93 0.72 0.91  CALCIUM 9.4 8.7* 9.2 9.5  PROT 6.9  --   --   --   ALBUMIN 4.3  --   --   --   AST 17  --   --   --   ALT 15  --   --   --   ALKPHOS 84  --   --   --   BILITOT 0.7  --   --   --   GFRNONAA  --  >60 >60 >60  ANIONGAP  --  '7 8 10     '$ Hematology Recent Labs  Lab 04/15/21 0511 04/16/21 0051 04/18/21 0127  WBC 7.4 5.6 5.9  RBC 4.20 4.25 4.47  HGB 12.8 12.7 13.7  HCT 39.4 39.3 40.2  MCV 93.8 92.5 89.9  MCH 30.5 29.9 30.6  MCHC 32.5 32.3 34.1  RDW 11.7 12.0 11.6  PLT 258 270 271    Cardiac EnzymesNo results  for input(s): TROPONINI in the last 168 hours. No results for input(s): TROPIPOC in the last 168 hours.   BNPNo results for input(s): BNP, PROBNP in the last 168 hours.   DDimer  Recent Labs  Lab 04/15/21 0511  DDIMER 0.78*     Radiology    CARDIAC CATHETERIZATION  Result Date: 04/17/2021 No angiographic evidence of CAD LVEDP 15-17 mmHg Recommendations: No further ischemic testing. Consider coronary vasospasm as the etiology of her chest pain and elevated troponin.   MR CARDIAC MORPHOLOGY W WO CONTRAST  Result Date: 04/18/2021 CLINICAL DATA:  Chest pain, elevated troponin COMPARISON: Echo 04/15/21 EXAM: CARDIAC MRI TECHNIQUE: The patient was scanned on a 1.5 Tesla GE magnet. A dedicated cardiac coil was used. Functional imaging was done using Fiesta sequences. 2,3, and 4 chamber views were done to assess for RWMA's. Modified Simpson's rule using a short axis stack was used to calculate an ejection fraction on a dedicated work Conservation officer, nature. The patient received 42m GADAVIST GADOBUTROL 1 MMOL/ML IV SOLN. After 10 minutes inversion recovery sequences were used to assess for infiltration and scar  tissue. This examination is tailored for evaluation cardiac anatomy and function and provides very limited assessment of noncardiac structures, which are accordingly not evaluated during interpretation. If there is clinical concern for extracardiac pathology, further evaluation with CT imaging should be considered. FINDINGS: LEFT VENTRICLE: Normal left ventricular chamber size. Normal left ventricular wall thickness. Normal left ventricular systolic function. LVEF = 69% There are no regional wall motion abnormalities. Focal myocardial edema, T2 = 61 msec in the inferolateral apex. Otherwise normal T2 values in myocardium. Normal first pass perfusion. There is post contrast delayed myocardial enhancement. There is subendocardial LGE in the lateral apex, appears less than 50% of myocardial thickness. This finding and localization is best visualized in series 38 (Hi Res PSIR). This finding most consistent with small area of infarct with preserved viability. Normal T1 myocardial nulling kinetics suggest against a diagnosis of cardiac amyloidosis. ECV = 25%, normal range RIGHT VENTRICLE: Normal right ventricular chamber size. Normal right ventricular wall thickness. Normal right ventricular systolic function. RVEF = 58% There are no regional wall motion abnormalities. No post contrast delayed myocardial enhancement. ATRIA: Normal left atrial size. Normal right atrial size. VALVES: No significant valvular abnormalities. Flow quantitation of aortic valve shows no significant aortic valve regurgitation, likely trivial. PERICARDIUM: Normal pericardium.  No pericardial effusion. OTHER: No significant extracardiac findings. MEASUREMENTS: Left ventricle: LV Female LV EF: 69% (normal 56-78%) Absolute volumes: LV EDV: 1269m(Normal 52-141 mL) LV ESV: 3771mNormal 13-51 mL) LV SV: 79m89mormal 33-97 mL) CO: 4.8L/min (Normal 2.7-6.0 L/min) Indexed volumes: LV EDV: 58mL63mm (Normal 41-81 mL/sq-m) LV ESV: 18mL/56m (Normal 12-21  mL/sq-m) LV SV: 40mL/s35m(Normal 26-56 mL/sq-m) CI: 2.33L/min/sq-m (normal 1.8-3.8 L/min/sq-m) Right ventricle: RV female RV EF: 58% (Normal 47-80%) Absolute volumes: RV EDV: 127 mL (Normal 58-154 mL) RV ESV: 53 mL (Normal 12-68 mL) RV SV: 74 mL (Normal 35-98 mL) CO: 4.3 L/min (Normal 2.7-6 L/min) Indexed volumes: RV EDV: 61 ML/sq-m (Normal 48-87 mL/sq-m) RV ESV: 25 mL/sq-m (Normal 11-28 mL/sq-m) RV SV: 36 mL/sq-m (Normal 27-57 mL/sq-m) CI: 2.07 L/min/sq-m (Normal 1.8-3.8 L/min/sq-m) IMPRESSION: 1. Normal biventricular chamber size and function. LVEF 69%, RVEF 58%. 2. Study performed to evaluate source of elevated troponin and chest pain with grossly normal epicardial coronary arteries. There is small area of myocardial edema and subendocardial delayed enhancement in the inferolateral LV apex, consistent with infarction. Findings less suggestive of  myocarditis. Electronically Signed   By: Cherlynn Kaiser   On: 04/18/2021 10:12    Cardiac Studies  Echo:  1. Left ventricular ejection fraction, by estimation, is 65 to 70%. The  left ventricle has normal function. The left ventricle has no regional  wall motion abnormalities. Left ventricular diastolic parameters were  normal.   2. Right ventricular systolic function is normal. The right ventricular  size is normal. There is normal pulmonary artery systolic pressure.   3. The mitral valve is normal in structure. Trivial mitral valve  regurgitation. No evidence of mitral stenosis.   4. The aortic valve is tricuspid. Aortic valve regurgitation is not  visualized. No aortic stenosis is present.   5. The inferior vena cava is normal in size with greater than 50%  respiratory variability, suggesting right atrial pressure of 3 mmHg.   Patient Profile     60 y.o. female  with a hx of obesity, anxiety, pre-DM, fibromyalgia, GERD, HTN, historically short PR interval, former brief tobacco use, admitted for NSTEMI   Assessment & Plan   Principal Problem:    NSTEMI (non-ST elevated myocardial infarction) ([place]) Active Problems:   Essential hypertension   Obesity (BMI 30-39.9)   Prediabetes   Fibromyalgia   Headache   Cholelithiasis   Anxiety   Elevated troponin   NSTEMI -no epicardial coronary disease - Mri with small area of infarct in inferolateral region of apex. With this finding we will use DAPT for 1 year. ASA 81 mg daily, plavix 75 mg daily. Likely 2/2 to small vessel disease with DM2 - continue metoprolol tartrate 12.5 mg BID. - resume amlodipine 2.5 mg daily.  - continue atorvastatin 40 mg daily.   Anxiety and panic - will check a TSH add on to prior labs.  - continues on paxil at home.  - may need to revisit as outpatient, feels her symptoms have been worsening lately.   Remainder per IM.    F/u with me in clinic 05/19/21, we will arrange. Stable for hospital d/c from cards perspective.  For questions or updates, please contact [place] Please consult [email or web address] for contact info under        Signed, Elouise Munroe, MD  04/18/2021, 10:45 AM

## 2021-04-18 NOTE — Discharge Summary (Addendum)
Physician Discharge Summary  Lisa Casey [id number] DOB: 1960-12-25 DOA: 04/15/2021  PCP: [name], FNP  Admit date: 04/15/2021 Discharge date: 04/18/2021  Admitted From: Home.  Disposition: Home.   Recommendations for Outpatient Follow-up:  Follow up with PCP in 1-2 weeks Please obtain BMP/CBC in one week Please follow up with cardiology as scheduled.   Discharge Condition:stable.  CODE STATUS:full code.  Diet recommendation: Heart Healthy / Carb Modified  Brief/Interim Summary: Lisa Casey is a 60 y.o. female with medical history significant of fibromyalgia; anxiety; chronic back pain; and HTN presenting with chest pain. Chest pain resolved. CATH does not show any obstructive CAD.   Discharge Diagnoses:  Principal Problem:   NSTEMI (non-ST elevated myocardial infarction) ([place]) Active Problems:   Essential hypertension   Obesity (BMI 30-39.9)   Prediabetes   Fibromyalgia   Headache   Cholelithiasis   Anxiety   Elevated troponin   NSTEMI: Chest pain resolved.  Echo unremarkable Cardiology consulted , Underwent cath which desn't to show any obstructive CAD. Underwent cardiac MRI showing very small area of inferolateral apical infarct, likely from small distal vessel occlusion - suspect secondary to diabetes.     Essential hypertension Well controlled.      Type 2 diabetes mellitus Last A1c was 6.4 Continue with sliding scale insulin. Resume metformin on discharge.      Headache Resolved , MRI and MRA unremarkable.         Anxiety Prn xanax in addition to vistiril.      Body mass index is 34.49 kg/m. Outpatient follow up .     Fibromyalgia Continue with home medications.  4 mm cavernous right ICA aneurysm. Recommend outpatient follow up with vascular surgery.  Discussed with the patient.       Discharge Instructions  Discharge Instructions     Diet - low sodium heart healthy   Complete by: As directed    Discharge  instructions   Complete by: As directed    Please follow up with cardiology as recommended.      Allergies as of 04/18/2021       Reactions   Codeine Itching   With rash        Medication List     STOP taking these medications    azelastine 0.1 % nasal spray Commonly known as: ASTELIN       TAKE these medications    acetaminophen 500 MG tablet Commonly known as: TYLENOL Take 1,000 mg by mouth every 6 (six) hours as needed for mild pain or headache.   albuterol 108 (90 Base) MCG/ACT inhaler Commonly known as: VENTOLIN HFA Inhale 2 puffs into the lungs every 6 (six) hours as needed for wheezing or shortness of breath.   amLODipine 2.5 MG tablet Commonly known as: NORVASC Take 1 tablet by mouth once daily   aspirin 81 MG EC tablet Take 1 tablet (81 mg total) by mouth daily. Swallow whole.   clopidogrel 75 MG tablet Commonly known as: PLAVIX Take 1 tablet (75 mg total) by mouth daily.   cyclobenzaprine 5 MG tablet Commonly known as: FLEXERIL Take 1 tablet by mouth three times daily as needed for muscle spasm   fluticasone 50 MCG/ACT nasal spray Commonly known as: FLONASE Place 2 sprays into both nostrils daily. What changed:  when to take this reasons to take this   hydrOXYzine 25 MG capsule Commonly known as: VISTARIL TAKE 1 CAPSULE BY MOUTH EVERY 6 HOURS AS NEEDED What changed: See the new instructions.  levocetirizine 5 MG tablet Commonly known as: Xyzal Take 1 tablet (5 mg total) by mouth every evening. What changed:  when to take this reasons to take this   lisdexamfetamine 20 MG capsule Commonly known as: Vyvanse Take 1 capsule (20 mg total) by mouth daily.   metFORMIN 500 MG tablet Commonly known as: Glucophage Take 1 tablet (500 mg total) by mouth daily.   metoprolol tartrate 25 MG tablet Commonly known as: LOPRESSOR Take 0.5 tablets (12.5 mg total) by mouth 2 (two) times daily.   neomycin-polymyxin b-dexamethasone 3.5-10000-0.1  Oint Commonly known as: MAXITROL Place 1 application into both eyes at bedtime as needed (eye irritation).   PARoxetine 20 MG tablet Commonly known as: PAXIL Take 1 tablet by mouth once daily   pregabalin 150 MG capsule Commonly known as: LYRICA Take 1 capsule (150 mg total) by mouth 2 (two) times daily. What changed:  when to take this reasons to take this   PROBIOTIC PO Take 1 capsule by mouth daily.   Restasis 0.05 % ophthalmic emulsion Generic drug: cycloSPORINE Place 1 drop into both eyes in the morning and at bedtime.        Follow-up Information     Elouise Munroe, MD Follow up on 05/19/2021.   Specialties: Cardiology, Radiology Why: at 0820 AM  try to come 15 min early to check in please Contact information: [address] [place] [address] [phone]         [name], [place]. Schedule an appointment as soon as possible for a visit in 1 week(s).   Specialty: General Practice Contact information: [place] [address] [phone]                Allergies  Allergen Reactions   Codeine Itching    With rash    Consultations: Cardiology.    Procedures/Studies: MR ANGIO HEAD WO CONTRAST  Result Date: 04/16/2021 CLINICAL DATA:  Initial evaluation for acute headache. EXAM: MRI HEAD WITHOUT CONTRAST MRA HEAD WITHOUT CONTRAST TECHNIQUE: Multiplanar, multi-echo pulse sequences of the brain and surrounding structures were acquired without intravenous contrast. Angiographic images of the Circle of Willis were acquired using MRA technique without intravenous contrast. COMPARISON:  No pertinent prior exam. FINDINGS: MRI HEAD FINDINGS Brain: Cerebral volume within normal limits. No significant cerebral white matter disease for age. No abnormal foci of restricted diffusion to suggest acute or subacute ischemia. Gray-white matter differentiation maintained. No encephalomalacia to suggest chronic cortical  infarction. No evidence for acute or chronic intracranial hemorrhage. No mass lesion, midline shift or mass effect. No hydrocephalus or extra-axial fluid collection. Pituitary gland suprasellar region normal. Midline structures intact and normal. Vascular: Major intracranial vascular flow voids are maintained. Skull and upper cervical spine: Craniocervical junction within normal limits. Bone marrow signal intensity normal. No scalp soft tissue abnormality. Sinuses/Orbits: Globes normal soft tissues within normal limits. Paranasal sinuses are largely clear. Small left mastoid effusion noted, of doubtful significance. Inner ear structures grossly normal. Other: None. MRA HEAD FINDINGS Anterior circulation: Visualized distal cervical segments of the internal carotid arteries are patent with antegrade flow. Petrous, cavernous, and supraclinoid segments patent without stenosis. 4 mm saccular outpouching arising from the cavernous right ICA suspicious for an aneurysm (series 1045, image 8). Prominent vascular infundibulum related to a small right posterior communicating artery noted as well. A1 segments widely patent. Normal anterior communicating artery complex. Anterior cerebral arteries patent to their distal aspects without stenosis. No M1 stenosis or occlusion.  Left M1 bifurcates early. Bifurcations M cells within normal limits. Distal MCA branches well perfused and symmetric. Posterior circulation: Both vertebral arteries patent to the vertebrobasilar junction without stenosis. Right vertebral artery slightly dominant. Left PICA patent. Right PICA not seen. Basilar mildly tortuous but widely patent to its distal aspect without stenosis. Superior cerebral arteries patent bilaterally. Both PCAs primarily supplied via the basilar well perfused or distal aspects. Anatomic variants: None significant. IMPRESSION: MRI HEAD IMPRESSION: Normal brain MRI for age. No acute intracranial abnormality. MRA HEAD IMPRESSION: 1.  Negative intracranial MRA for large vessel occlusion or hemodynamically significant stenosis. 2. 4 mm cavernous right ICA aneurysm. Electronically Signed   By: Jeannine Boga M.D.   On: 04/16/2021 00:09   MR BRAIN WO CONTRAST  Result Date: 04/16/2021 CLINICAL DATA:  Initial evaluation for acute headache. EXAM: MRI HEAD WITHOUT CONTRAST MRA HEAD WITHOUT CONTRAST TECHNIQUE: Multiplanar, multi-echo pulse sequences of the brain and surrounding structures were acquired without intravenous contrast. Angiographic images of the Circle of Willis were acquired using MRA technique without intravenous contrast. COMPARISON:  No pertinent prior exam. FINDINGS: MRI HEAD FINDINGS Brain: Cerebral volume within normal limits. No significant cerebral white matter disease for age. No abnormal foci of restricted diffusion to suggest acute or subacute ischemia. Gray-white matter differentiation maintained. No encephalomalacia to suggest chronic cortical infarction. No evidence for acute or chronic intracranial hemorrhage. No mass lesion, midline shift or mass effect. No hydrocephalus or extra-axial fluid collection. Pituitary gland suprasellar region normal. Midline structures intact and normal. Vascular: Major intracranial vascular flow voids are maintained. Skull and upper cervical spine: Craniocervical junction within normal limits. Bone marrow signal intensity normal. No scalp soft tissue abnormality. Sinuses/Orbits: Globes normal soft tissues within normal limits. Paranasal sinuses are largely clear. Small left mastoid effusion noted, of doubtful significance. Inner ear structures grossly normal. Other: None. MRA HEAD FINDINGS Anterior circulation: Visualized distal cervical segments of the internal carotid arteries are patent with antegrade flow. Petrous, cavernous, and supraclinoid segments patent without stenosis. 4 mm saccular outpouching arising from the cavernous right ICA suspicious for an aneurysm (series 1045,  image 8). Prominent vascular infundibulum related to a small right posterior communicating artery noted as well. A1 segments widely patent. Normal anterior communicating artery complex. Anterior cerebral arteries patent to their distal aspects without stenosis. No M1 stenosis or occlusion. Left M1 bifurcates early. Bifurcations M cells within normal limits. Distal MCA branches well perfused and symmetric. Posterior circulation: Both vertebral arteries patent to the vertebrobasilar junction without stenosis. Right vertebral artery slightly dominant. Left PICA patent. Right PICA not seen. Basilar mildly tortuous but widely patent to its distal aspect without stenosis. Superior cerebral arteries patent bilaterally. Both PCAs primarily supplied via the basilar well perfused or distal aspects. Anatomic variants: None significant. IMPRESSION: MRI HEAD IMPRESSION: Normal brain MRI for age. No acute intracranial abnormality. MRA HEAD IMPRESSION: 1. Negative intracranial MRA for large vessel occlusion or hemodynamically significant stenosis. 2. 4 mm cavernous right ICA aneurysm. Electronically Signed   By: Jeannine Boga M.D.   On: 04/16/2021 00:09   CARDIAC CATHETERIZATION  Result Date: 04/17/2021 No angiographic evidence of CAD LVEDP 15-17 mmHg Recommendations: No further ischemic testing. Consider coronary vasospasm as the etiology of her chest pain and elevated troponin.   DG Chest Port 1 View  Result Date: 04/15/2021 CLINICAL DATA:  61 year old female with chest pain. EXAM: PORTABLE CHEST 1 VIEW COMPARISON:  Chest radiographs 09/13/2020 and earlier. FINDINGS: Portable AP semi upright view at 0552 hours. Lower lung  volumes. Chronic lingula scarring. Increased patchy bibasilar opacity most resembling atelectasis. Mediastinal contours are stable and within normal limits. Chronic thoracic scoliosis. Visualized tracheal air column is within normal limits. No pneumothorax, pulmonary edema, pleural effusion or  consolidation. Paucity of bowel gas. No acute osseous abnormality identified. IMPRESSION: Lower lung volumes with bibasilar atelectasis.  Scoliosis. Electronically Signed   By: Genevie Ann M.D.   On: 04/15/2021 06:25   MR CARDIAC MORPHOLOGY W WO CONTRAST  Result Date: 04/18/2021 CLINICAL DATA:  Chest pain, elevated troponin COMPARISON: Echo 04/15/21 EXAM: CARDIAC MRI TECHNIQUE: The patient was scanned on a 1.5 Tesla GE magnet. A dedicated cardiac coil was used. Functional imaging was done using Fiesta sequences. 2,3, and 4 chamber views were done to assess for RWMA's. Modified Simpson's rule using a short axis stack was used to calculate an ejection fraction on a dedicated work Conservation officer, nature. The patient received 24m GADAVIST GADOBUTROL 1 MMOL/ML IV SOLN. After 10 minutes inversion recovery sequences were used to assess for infiltration and scar tissue. This examination is tailored for evaluation cardiac anatomy and function and provides very limited assessment of noncardiac structures, which are accordingly not evaluated during interpretation. If there is clinical concern for extracardiac pathology, further evaluation with CT imaging should be considered. FINDINGS: LEFT VENTRICLE: Normal left ventricular chamber size. Normal left ventricular wall thickness. Normal left ventricular systolic function. LVEF = 69% There are no regional wall motion abnormalities. Focal myocardial edema, T2 = 61 msec in the inferolateral apex. Otherwise normal T2 values in myocardium. Normal first pass perfusion. There is post contrast delayed myocardial enhancement. There is subendocardial LGE in the lateral apex, appears less than 50% of myocardial thickness. This finding and localization is best visualized in series 38 (Hi Res PSIR). This finding most consistent with small area of infarct with preserved viability. Normal T1 myocardial nulling kinetics suggest against a diagnosis of cardiac amyloidosis. ECV = 25%, normal  range RIGHT VENTRICLE: Normal right ventricular chamber size. Normal right ventricular wall thickness. Normal right ventricular systolic function. RVEF = 58% There are no regional wall motion abnormalities. No post contrast delayed myocardial enhancement. ATRIA: Normal left atrial size. Normal right atrial size. VALVES: No significant valvular abnormalities. Flow quantitation of aortic valve shows no significant aortic valve regurgitation, likely trivial. PERICARDIUM: Normal pericardium.  No pericardial effusion. OTHER: No significant extracardiac findings. MEASUREMENTS: Left ventricle: LV Female LV EF: 69% (normal 56-78%) Absolute volumes: LV EDV: 1272m(Normal 52-141 mL) LV ESV: 3754mNormal 13-51 mL) LV SV: 55m64mormal 33-97 mL) CO: 4.8L/min (Normal 2.7-6.0 L/min) Indexed volumes: LV EDV: 58mL64mm (Normal 41-81 mL/sq-m) LV ESV: 18mL/58m (Normal 12-21 mL/sq-m) LV SV: 40mL/s40m(Normal 26-56 mL/sq-m) CI: 2.33L/min/sq-m (normal 1.8-3.8 L/min/sq-m) Right ventricle: RV female RV EF: 58% (Normal 47-80%) Absolute volumes: RV EDV: 127 mL (Normal 58-154 mL) RV ESV: 53 mL (Normal 12-68 mL) RV SV: 74 mL (Normal 35-98 mL) CO: 4.3 L/min (Normal 2.7-6 L/min) Indexed volumes: RV EDV: 61 ML/sq-m (Normal 48-87 mL/sq-m) RV ESV: 25 mL/sq-m (Normal 11-28 mL/sq-m) RV SV: 36 mL/sq-m (Normal 27-57 mL/sq-m) CI: 2.07 L/min/sq-m (Normal 1.8-3.8 L/min/sq-m) IMPRESSION: 1. Normal biventricular chamber size and function. LVEF 69%, RVEF 58%. 2. Study performed to evaluate source of elevated troponin and chest pain with grossly normal epicardial coronary arteries. There is small area of myocardial edema and subendocardial delayed enhancement in the inferolateral LV apex, consistent with infarction. Findings less suggestive of myocarditis. Electronically Signed   By: GayatriCherlynn Kaiser08/10/2020 10:12  ECHOCARDIOGRAM COMPLETE  Result Date: 04/15/2021    ECHOCARDIOGRAM REPORT   Patient Name:   GRADY VILLADA Casey Date of Exam:  04/15/2021 Medical Rec #:  [id number]        Height:       65.0 in Accession #:    [id number]       Weight:       212.0 lb Date of Birth:  [birth year]        BSA:          2.028 m Patient Age:    11 years         BP:           141/82 mmHg Patient Gender: F                HR:           68 bpm. Exam Location:  Inpatient Procedure: 2D Echo Indications:    chest pain  History:        Patient has no prior history of Echocardiogram examinations.                 Fibromyalgia; Risk Factors:Hypertension.  Sonographer:    Johny Chess Referring Phys: [place]  1. Left ventricular ejection fraction, by estimation, is 65 to 70%. The left ventricle has normal function. The left ventricle has no regional wall motion abnormalities. Left ventricular diastolic parameters were normal.  2. Right ventricular systolic function is normal. The right ventricular size is normal. There is normal pulmonary artery systolic pressure.  3. The mitral valve is normal in structure. Trivial mitral valve regurgitation. No evidence of mitral stenosis.  4. The aortic valve is tricuspid. Aortic valve regurgitation is not visualized. No aortic stenosis is present.  5. The inferior vena cava is normal in size with greater than 50% respiratory variability, suggesting right atrial pressure of 3 mmHg. FINDINGS  Left Ventricle: Left ventricular ejection fraction, by estimation, is 65 to 70%. The left ventricle has normal function. The left ventricle has no regional wall motion abnormalities. The left ventricular internal cavity size was normal in size. There is  no left ventricular hypertrophy. Left ventricular diastolic parameters were normal. Right Ventricle: The right ventricular size is normal. No increase in right ventricular wall thickness. Right ventricular systolic function is normal. There is normal pulmonary artery systolic pressure. The tricuspid regurgitant velocity is 2.70 m/s, and  with an assumed right atrial pressure  of 3 mmHg, the estimated right ventricular systolic pressure is [id number] mmHg. Left Atrium: Left atrial size was normal in size. Right Atrium: Right atrial size was normal in size. Pericardium: There is no evidence of pericardial effusion. Mitral Valve: The mitral valve is normal in structure. Trivial mitral valve regurgitation. No evidence of mitral valve stenosis. Tricuspid Valve: The tricuspid valve is normal in structure. Tricuspid valve regurgitation is trivial. No evidence of tricuspid stenosis. Aortic Valve: The aortic valve is tricuspid. Aortic valve regurgitation is not visualized. No aortic stenosis is present. Pulmonic Valve: The pulmonic valve was normal in structure. Pulmonic valve regurgitation is not visualized. No evidence of pulmonic stenosis. Aorta: The aortic root is normal in size and structure. Venous: The inferior vena cava is normal in size with greater than 50% respiratory variability, suggesting right atrial pressure of 3 mmHg. IAS/Shunts: No atrial level shunt detected by color flow Doppler.  LEFT VENTRICLE PLAX 2D LVIDd:         3.80 cm  Diastology LVIDs:  2.10 cm  LV e' medial:    9.90 cm/s LV PW:         0.90 cm  LV E/e' medial:  7.7 LV IVS:        0.70 cm  LV e' lateral:   12.80 cm/s LVOT diam:     1.60 cm  LV E/e' lateral: 5.9 LV SV:         52 LV SV Index:   26 LVOT Area:     2.01 cm  RIGHT VENTRICLE             IVC RV S prime:     14.60 cm/s  IVC diam: 1.60 cm TAPSE (M-mode): 2.2 cm LEFT ATRIUM             Index       RIGHT ATRIUM           Index LA diam:        3.60 cm 1.78 cm/m  RA Area:     11.50 cm LA Vol (A2C):   59.6 ml 29.39 ml/m RA Volume:   23.40 ml  11.54 ml/m LA Vol (A4C):   50.4 ml 24.86 ml/m LA Biplane Vol: 54.6 ml 26.93 ml/m  AORTIC VALVE LVOT Vmax:   110.00 cm/s LVOT Vmean:  77.600 cm/s LVOT VTI:    0.258 m  AORTA Ao Root diam: 2.50 cm Ao Asc diam:  2.80 cm MITRAL VALVE               TRICUSPID VALVE MV Area (PHT): 3.85 cm    TR Peak grad:   29.2 mmHg MV  Decel Time: 197 msec    TR Vmax:        270.00 cm/s MV E velocity: 75.80 cm/s MV A velocity: 69.00 cm/s  SHUNTS MV E/A ratio:  1.10        Systemic VTI:  0.26 m                            Systemic Diam: 1.60 cm Skeet Latch MD Electronically signed by Skeet Latch MD Signature Date/Time: 04/15/2021/3:55:54 PM    Final    CT Angio Chest/Abd/Pel for Dissection W and/or Wo Contrast  Result Date: 04/15/2021 CLINICAL DATA:  60 year old female with central chest and abdominal pain which woke her from sleep. Chest pain and arm numbness. EXAM: CT ANGIOGRAPHY CHEST, ABDOMEN AND PELVIS TECHNIQUE: Non-contrast CT of the chest was initially obtained. Multidetector CT imaging through the chest, abdomen and pelvis was performed using the standard protocol during bolus administration of intravenous contrast. Multiplanar reconstructed images and MIPs were obtained and reviewed to evaluate the vascular anatomy. CONTRAST:  149m OMNIPAQUE IOHEXOL 350 MG/ML SOLN COMPARISON:  CT Abdomen and Pelvis 09/21/2012. Portable chest radiograph earlier today. FINDINGS: CTA CHEST FINDINGS Cardiovascular: Normal caliber thoracic aorta. Negative for aortic dissection. Cardiac size within normal limits. No pericardial effusion. Central and hilar pulmonary arteries also well contrast enhanced and appear to be patent. Some calcified coronary artery atherosclerosis is evident. Mediastinum/Nodes: Negative. No mediastinal mass or lymphadenopathy. Lungs/Pleura: Atelectatic changes to the major airways which remain patent. Platelike and streaky bilateral lower lobe and lingula opacity more resembles atelectasis than infection. A small right lower lobe lung nodule on series 8, image 68 was present in 2014 and not significantly changed compatible with benign etiology. Similar tiny subpleural left upper lobe nodule on series 8, image 51 is likely postinflammatory. No pleural effusion or consolidation. Musculoskeletal:  Mild to moderate scoliosis. No  acute osseous abnormality identified. Review of the MIP images confirms the above findings. CTA ABDOMEN AND PELVIS FINDINGS VASCULAR Abdominal aorta is normal. Major aortic branches are patent. Bilateral iliac arteries and visible proximal femoral arteries are normal. Review of the MIP images confirms the above findings. NON-VASCULAR Hepatobiliary: Small gallstones in the neck of the gallbladder. No pericholecystic inflammation. Negative liver. Pancreas: Negative. Spleen: Negative. Adrenals/Urinary Tract: Normal adrenal glands. Best seen on precontrast series 5 there is extensive renal collecting system calcification suggesting medullary nephrocalcinosis and/or developing staghorn renal calculi. There is a 7 mm stone at the right ureteropelvic junction. Mild right hydronephrosis. No perinephric inflammatory stranding. Distal to the stone the right ureter appears decompressed. The left ureter is decompressed, but with indeterminate density throughout the lumen of the left ureter. No excreted IV contrast in the urinary bladder. The bladder is mildly distended but otherwise appears negative. Stomach/Bowel: Mild gas distended sigmoid. Mild retained stool and redundancy of the large bowel. Mild diverticulosis at the hepatic flexure. No active inflammation. Normal appendix series 7, image 204. Negative terminal ileum. No dilated small bowel. Negative stomach and duodenum. No free air, free fluid, mesenteric inflammation. Lymphatic: No lymphadenopathy. Reproductive: Absent uterus.  Diminutive or absent ovaries. Other: No pelvic free fluid. Musculoskeletal: Chronic degenerative S1 superior endplate sclerosis. No acute osseous abnormality identified. Review of the MIP images confirms the above findings. IMPRESSION: 1. Normal aorta. Central pulmonary arteries appear patent. Some calcified coronary artery atherosclerosis. 2. Abnormal kidney suggesting medullary nephrocalcinosis and/or developing renal staghorn calculi. And a 7  mm calculus at the right ureteropelvic junction appears to be causing mild Right Hydronephrosis. 3. Cholelithiasis but no CT evidence of acute cholecystitis. 4. Streaky lung opacity in the lingula and both lower lobes more resembles atelectasis or scarring than infection. No pleural effusion. 5. Small benign right lower lobe pulmonary nodule. There is a 2nd tiny left upper lobe nodule on series 8, image 51. No follow-up needed if patient is low-risk. Non-contrast chest CT can be considered in 12 months if patient is high-risk. This recommendation follows the consensus statement: Guidelines for Management of Incidental Pulmonary Nodules Detected on CT Images: From the Fleischner Society 2017; Radiology 2017; 284:228-243. Electronically Signed   By: Genevie Ann M.D.   On: 04/15/2021 08:22      Subjective:  No new complaints.   Discharge Exam: Vitals:   04/18/21 0833 04/18/21 1048  BP: (!) 149/78 (!) 148/82  Pulse:  63  Resp: 13 14  Temp: 98.5 F (36.9 C) 98.8 F (37.1 C)  SpO2:  92%   Vitals:   04/18/21 0300 04/18/21 0416 04/18/21 0833 04/18/21 1048  BP:  131/80 (!) 149/78 (!) 148/82  Pulse:  61  63  Resp: '17 13 13 14  '$ Temp:  98.7 F (37.1 C) 98.5 F (36.9 C) 98.8 F (37.1 C)  TempSrc:  Oral Oral Oral  SpO2:  99%  92%  Weight:      Height:        General: Pt is alert, awake, not in acute distress Cardiovascular: RRR, S1/S2 +, no rubs, no gallops Respiratory: CTA bilaterally, no wheezing, no rhonchi Abdominal: Soft, NT, ND, bowel sounds + Extremities: no edema, no cyanosis    The results of significant diagnostics from this hospitalization (including imaging, microbiology, ancillary and laboratory) are listed below for reference.     Microbiology: Recent Results (from the past 240 hour(s))  Resp Panel by RT-PCR (Flu A&B, Covid) Nasopharyngeal Swab  Status: None   Collection Time: 04/15/21  8:56 AM   Specimen: Nasopharyngeal Swab; Nasopharyngeal(NP) swabs in vial transport  medium  Result Value Ref Range Status   SARS Coronavirus 2 by RT PCR NEGATIVE NEGATIVE Final    Comment: (NOTE) SARS-CoV-2 target nucleic acids are NOT DETECTED.  The SARS-CoV-2 RNA is generally detectable in upper respiratory specimens during the acute phase of infection. The lowest concentration of SARS-CoV-2 viral copies this assay can detect is 138 copies/mL. A negative result does not preclude SARS-Cov-2 infection and should not be used as the sole basis for treatment or other patient management decisions. A negative result may occur with  improper specimen collection/handling, submission of specimen other than nasopharyngeal swab, presence of viral mutation(s) within the areas targeted by this assay, and inadequate number of viral copies(<138 copies/mL). A negative result must be combined with clinical observations, patient history, and epidemiological information. The expected result is Negative.  Fact Sheet for Patients:  EntrepreneurPulse.com.au  Fact Sheet for Healthcare Providers:  IncredibleEmployment.be  This test is no t yet approved or cleared by the [place] FDA and  has been authorized for detection and/or diagnosis of SARS-CoV-2 by FDA under an Emergency Use Authorization (EUA). This EUA will remain  in effect (meaning this test can be used) for the duration of the COVID-19 declaration under Section 564(b)(1) of the Act, 21 U.S.C.section 360bbb-3(b)(1), unless the authorization is terminated  or revoked sooner.       Influenza A by PCR NEGATIVE NEGATIVE Final   Influenza B by PCR NEGATIVE NEGATIVE Final    Comment: (NOTE) The Xpert Xpress SARS-CoV-2/FLU/RSV plus assay is intended as an aid in the diagnosis of influenza from Nasopharyngeal swab specimens and should not be used as a sole basis for treatment. Nasal washings and aspirates are unacceptable for Xpert Xpress SARS-CoV-2/FLU/RSV testing.  Fact Sheet for  Patients: EntrepreneurPulse.com.au  Fact Sheet for Healthcare Providers: IncredibleEmployment.be  This test is not yet approved or cleared by the [place] FDA and has been authorized for detection and/or diagnosis of SARS-CoV-2 by FDA under an Emergency Use Authorization (EUA). This EUA will remain in effect (meaning this test can be used) for the duration of the COVID-19 declaration under Section 564(b)(1) of the Act, 21 U.S.C. section 360bbb-3(b)(1), unless the authorization is terminated or revoked.  Performed at [facility], [place] [address] [place], [place] [address]      Labs: BNP (last 3 results) No results for input(s): BNP in the last 8760 hours. Basic Metabolic Panel: Recent Labs  Lab 04/13/21 1018 04/15/21 0511 04/15/21 1503 04/16/21 0051 04/18/21 0127  NA 141 137  --  138 136  K 4.3 3.6  --  4.1 3.8  CL 104 106  --  105 103  CO2 23 24  --  25 23  GLUCOSE 106* 156*  --  115* 112*  BUN 14 18  --  10 17  CREATININE 0.78 0.93  --  0.72 0.91  CALCIUM 9.4 8.7*  --  9.2 9.5  MG  --   --  2.0  --   --    Liver Function Tests: Recent Labs  Lab 04/13/21 1018  AST 17  ALT 15  ALKPHOS 84  BILITOT 0.7  PROT 6.9  ALBUMIN 4.3   No results for input(s): LIPASE, AMYLASE in the last 168 hours. No results for input(s): AMMONIA in the last 168 hours. CBC: Recent Labs  Lab 04/15/21 0511 04/16/21 0051 04/18/21 0127  WBC 7.4 5.6 5.9  HGB 12.8 12.7 13.7  HCT 39.4 39.3 40.2  MCV 93.8 92.5 89.9  PLT 258 270 271   Cardiac Enzymes: No results for input(s): CKTOTAL, CKMB, CKMBINDEX, TROPONINI in the last 168 hours. BNP: Invalid input(s): POCBNP CBG: Recent Labs  Lab 04/17/21 1639 04/17/21 2116 04/18/21 0415 04/18/21 0849 04/18/21 1052  GLUCAP 96 111* 114* 111* 89   D-Dimer No results for input(s): DDIMER in the last 72 hours. Hgb A1c No results for input(s): HGBA1C in the last 72 hours. Lipid  Profile Recent Labs    04/16/21 0051  CHOL 144  HDL 61  LDLCALC 67  TRIG 81  CHOLHDL 2.4   Thyroid function studies No results for input(s): TSH, T4TOTAL, T3FREE, THYROIDAB in the last 72 hours.  Invalid input(s): FREET3 Anemia work up No results for input(s): VITAMINB12, FOLATE, FERRITIN, TIBC, IRON, RETICCTPCT in the last 72 hours. Urinalysis    Component Value Date/Time   COLORURINE YELLOW 06/08/2016 0835   APPEARANCEUR HAZY (A) 06/08/2016 0835   LABSPEC 1.026 06/08/2016 0835   PHURINE 6.0 06/08/2016 0835   GLUCOSEU NEGATIVE 06/08/2016 0835   HGBUR NEGATIVE 06/08/2016 0835   BILIRUBINUR negative 11/03/2019 1616   KETONESUR NEGATIVE 06/08/2016 0835   PROTEINUR Negative 11/03/2019 1616   PROTEINUR NEGATIVE 06/08/2016 0835   UROBILINOGEN 0.2 11/03/2019 1616   UROBILINOGEN 1.0 10/28/2013 1924   NITRITE negative 11/03/2019 1616   NITRITE NEGATIVE 06/08/2016 0835   LEUKOCYTESUR Trace (A) 11/03/2019 1616   Sepsis Labs Invalid input(s): PROCALCITONIN,  WBC,  LACTICIDVEN Microbiology Recent Results (from the past 240 hour(s))  Resp Panel by RT-PCR (Flu A&B, Covid) Nasopharyngeal Swab     Status: None   Collection Time: 04/15/21  8:56 AM   Specimen: Nasopharyngeal Swab; Nasopharyngeal(NP) swabs in vial transport medium  Result Value Ref Range Status   SARS Coronavirus 2 by RT PCR NEGATIVE NEGATIVE Final    Comment: (NOTE) SARS-CoV-2 target nucleic acids are NOT DETECTED.  The SARS-CoV-2 RNA is generally detectable in upper respiratory specimens during the acute phase of infection. The lowest concentration of SARS-CoV-2 viral copies this assay can detect is 138 copies/mL. A negative result does not preclude SARS-Cov-2 infection and should not be used as the sole basis for treatment or other patient management decisions. A negative result may occur with  improper specimen collection/handling, submission of specimen other than nasopharyngeal swab, presence of viral  mutation(s) within the areas targeted by this assay, and inadequate number of viral copies(<138 copies/mL). A negative result must be combined with clinical observations, patient history, and epidemiological information. The expected result is Negative.  Fact Sheet for Patients:  EntrepreneurPulse.com.au  Fact Sheet for Healthcare Providers:  IncredibleEmployment.be  This test is no t yet approved or cleared by the [place] FDA and  has been authorized for detection and/or diagnosis of SARS-CoV-2 by FDA under an Emergency Use Authorization (EUA). This EUA will remain  in effect (meaning this test can be used) for the duration of the COVID-19 declaration under Section 564(b)(1) of the Act, 21 U.S.C.section 360bbb-3(b)(1), unless the authorization is terminated  or revoked sooner.       Influenza A by PCR NEGATIVE NEGATIVE Final   Influenza B by PCR NEGATIVE NEGATIVE Final    Comment: (NOTE) The Xpert Xpress SARS-CoV-2/FLU/RSV plus assay is intended as an aid in the diagnosis of influenza from Nasopharyngeal swab specimens and should not be used as a sole basis for treatment. Nasal washings and aspirates are unacceptable  for Xpert Xpress SARS-CoV-2/FLU/RSV testing.  Fact Sheet for Patients: EntrepreneurPulse.com.au  Fact Sheet for Healthcare Providers: IncredibleEmployment.be  This test is not yet approved or cleared by the [place] FDA and has been authorized for detection and/or diagnosis of SARS-CoV-2 by FDA under an Emergency Use Authorization (EUA). This EUA will remain in effect (meaning this test can be used) for the duration of the COVID-19 declaration under Section 564(b)(1) of the Act, 21 U.S.C. section 360bbb-3(b)(1), unless the authorization is terminated or revoked.  Performed at [facility], [place] [address], [place], [place] [address]      Time coordinating discharge:  36 minutes.   SIGNED:   Hosie Poisson, MD  [facility] 04/18/2021, 11:33 AM

## 2021-04-18 NOTE — Plan of Care (Signed)

## 2021-04-20 ENCOUNTER — Other Ambulatory Visit: Payer: Self-pay

## 2021-04-20 ENCOUNTER — Ambulatory Visit: Payer: BC Managed Care – PPO | Admitting: Nurse Practitioner

## 2021-04-20 ENCOUNTER — Encounter: Payer: Self-pay | Admitting: Nurse Practitioner

## 2021-04-20 VITALS — BP 126/84 | HR 66 | Temp 98.5°F | Ht 65.0 in | Wt 208.4 lb

## 2021-04-20 DIAGNOSIS — I214 Non-ST elevation (NSTEMI) myocardial infarction: Secondary | ICD-10-CM | POA: Diagnosis not present

## 2021-04-20 DIAGNOSIS — R7303 Prediabetes: Secondary | ICD-10-CM

## 2021-04-20 DIAGNOSIS — Z09 Encounter for follow-up examination after completed treatment for conditions other than malignant neoplasm: Secondary | ICD-10-CM

## 2021-04-20 DIAGNOSIS — E1165 Type 2 diabetes mellitus with hyperglycemia: Secondary | ICD-10-CM

## 2021-04-20 MED ORDER — ONETOUCH VERIO VI STRP: ORAL_STRIP | 5 refills | Status: DC

## 2021-04-20 MED ORDER — ONETOUCH VERIO W/DEVICE KIT: PACK | 1 refills | Status: DC

## 2021-04-20 MED ORDER — METFORMIN HCL 500 MG PO TABS: 500.0000 mg | ORAL_TABLET | Freq: Two times a day (BID) | ORAL | 3 refills | Status: DC

## 2021-04-20 MED ORDER — ONETOUCH DELICA LANCETS 33G MISC: 5 refills | Status: DC

## 2021-04-20 NOTE — Progress Notes (Signed)
I,[name],acting as a Education administrator for [employer], NP.,have documented all relevant documentation on the behalf of [employer], NP,as directed by  Bary Castilla, NP while in the presence of Bary Castilla, NP.  This visit occurred during the SARS-CoV-2 public health emergency.  Safety protocols were in place, including screening questions prior to the visit, additional usage of staff PPE, and extensive cleaning of exam room while observing appropriate contact time as indicated for disinfecting solutions.  Subjective:     Patient ID: Lisa Casey , female    DOB: [birth year] , 60 y.o.   MRN: [id number]   Chief Complaint  Patient presents with   Hospitalization Follow-up    HPI  The is here today for a hospital f/u.  The patient recently had a heart attack. She went to the hospital. On 8/2 she had a angiograph ca.  She has an appointment with the cardiologist on Sept 2nd. She is starting to take care of herself more. She is starting to exercise more and eat a lot healthier.    Other Pertinent negatives include no arthralgias, chest pain, chills, congestion, coughing, fatigue, headaches, myalgias or weakness.    Past Medical History:  Diagnosis Date   Anxiety    HX OF ANXIETY ATTACKS   Back pain, chronic    Cholelithiasis 04/15/2021   Diabetes ([place])    started Metformin in 02/2021   Fibromyalgia    GERD (gastroesophageal reflux disease)    Hypertension    Migraines    HX OF MIGRAINES   Obesity    Shortened PR interval      Family History  Problem Relation Age of Onset   Breast cancer Mother 40   Breast cancer Paternal Grandmother    Heart failure Neg Hx    Coronary artery disease Neg Hx      Current Outpatient Medications:    acetaminophen (TYLENOL) 500 MG tablet, Take 1,000 mg by mouth every 6 (six) hours as needed for mild pain or headache., Disp: , Rfl:    albuterol (VENTOLIN HFA) 108 (90 Base) MCG/ACT inhaler, Inhale 2 puffs into the lungs  every 6 (six) hours as needed for wheezing or shortness of breath., Disp: 8 g, Rfl: 2   amLODipine (NORVASC) 2.5 MG tablet, Take 1 tablet by mouth once daily, Disp: 90 tablet, Rfl: 0   aspirin EC 81 MG EC tablet, Take 1 tablet (81 mg total) by mouth daily. Swallow whole., Disp: 30 tablet, Rfl: 11   clopidogrel (PLAVIX) 75 MG tablet, Take 1 tablet (75 mg total) by mouth daily., Disp: 30 tablet, Rfl: 3   cyclobenzaprine (FLEXERIL) 5 MG tablet, Take 1 tablet by mouth three times daily as needed for muscle spasm, Disp: 40 tablet, Rfl: 0   fluticasone (FLONASE) 50 MCG/ACT nasal spray, Place 2 sprays into both nostrils daily., Disp: 16 g, Rfl: 0   hydrOXYzine (VISTARIL) 25 MG capsule, TAKE 1 CAPSULE BY MOUTH EVERY 6 HOURS AS NEEDED, Disp: 30 capsule, Rfl: 0   levocetirizine (XYZAL) 5 MG tablet, Take 1 tablet (5 mg total) by mouth every evening., Disp: 90 tablet, Rfl: 1   lisdexamfetamine (VYVANSE) 20 MG capsule, Take 1 capsule (20 mg total) by mouth daily., Disp: 30 capsule, Rfl: 0   metoprolol tartrate (LOPRESSOR) 25 MG tablet, Take 0.5 tablets (12.5 mg total) by mouth 2 (two) times daily., Disp: 60 tablet, Rfl: 0   neomycin-polymyxin b-dexamethasone (MAXITROL) 3.5-10000-0.1 OINT, Place 1 application into both eyes at bedtime as needed (eye irritation)., Disp: ,  Rfl:    PARoxetine (PAXIL) 20 MG tablet, Take 1 tablet by mouth once daily, Disp: 90 tablet, Rfl: 0   pregabalin (LYRICA) 150 MG capsule, Take 1 capsule (150 mg total) by mouth 2 (two) times daily., Disp: 60 capsule, Rfl: 3   Probiotic Product (PROBIOTIC PO), Take 1 capsule by mouth daily., Disp: , Rfl:    RESTASIS 0.05 % ophthalmic emulsion, Place 1 drop into both eyes in the morning and at bedtime., Disp: , Rfl:    Blood Glucose Monitoring Suppl (ONETOUCH VERIO) w/Device KIT, Use as directed to check blood sugars 2 times per day dx: e11.65, Disp: 1 kit, Rfl: 1   glucose blood (ONETOUCH VERIO) test strip, Use as directed to check blood sugars 2  times per day dx: e11.65, Disp: 100 each, Rfl: 5   metFORMIN (GLUCOPHAGE) 500 MG tablet, Take 1 tablet (500 mg total) by mouth 2 (two) times daily with a meal., Disp: 180 tablet, Rfl: 3   OneTouch Delica Lancets 16R MISC, Use as directed to check blood sugars 2 times per day dx: e11.65, Disp: 100 each, Rfl: 5   Allergies  Allergen Reactions   Codeine Itching    With rash     Review of Systems  Constitutional: Negative.  Negative for chills and fatigue.  HENT:  Negative for congestion, rhinorrhea, sinus pressure and sinus pain.   Respiratory: Negative.  Negative for cough and shortness of breath.   Cardiovascular: Negative.  Negative for chest pain and palpitations.  Gastrointestinal: Negative.   Musculoskeletal:  Negative for arthralgias and myalgias.  Neurological:  Negative for weakness and headaches.  Psychiatric/Behavioral: Negative.    All other systems reviewed and are negative.   Today's Vitals   04/20/21 1538  BP: 126/84  Pulse: 66  Temp: 98.5 F (36.9 C)  TempSrc: Oral  Weight: 208 lb 6.4 oz (94.5 kg)  Height: $Remove'5\' 5"'PlCjnlS$  (1.651 m)  PainSc: 4   PainLoc: Generalized   Body mass index is 34.68 kg/m.  Wt Readings from Last 3 Encounters:  04/20/21 208 lb 6.4 oz (94.5 kg)  04/17/21 207 lb 3.7 oz (94 kg)  04/13/21 212 lb (96.2 kg)    BP Readings from Last 3 Encounters:  04/20/21 126/84  04/18/21 140/88  04/13/21 128/72    Objective:  Physical Exam Constitutional:      Appearance: Normal appearance. She is obese.  HENT:     Head: Normocephalic and atraumatic.  Cardiovascular:     Rate and Rhythm: Normal rate and regular rhythm.     Pulses: Normal pulses.     Heart sounds: Normal heart sounds. No murmur heard. Pulmonary:     Effort: Pulmonary effort is normal. No respiratory distress.     Breath sounds: Normal breath sounds. No wheezing.  Skin:    General: Skin is warm and dry.     Capillary Refill: Capillary refill takes less than 2 seconds.  Neurological:      Mental Status: She is alert.  Psychiatric:        Mood and Affect: Mood normal.        Behavior: Behavior normal.        Assessment And Plan:     1. Hospital discharge follow-up Patient is here for a follow up visit from the hospital after she had some chest pain and felt palpitations. She has not been eating healthy. She also has been under a lot of stress at home. She has started to eat better and started to exercise.  She is taking her meds as prescribed. She has a cardiologist appt on Sept 2nd.   2. NSTEMI (non-ST elevated myocardial infarction) ([place]) -Post NSTEMI will do repeat lab work. Cardiologist appt Sept 2nd  - CBC no Diff - CMP14+EGFR  3. Prediabetes -Last Hgb A1c was 6.4. will increase metformin to BID  --Discussed with patient the importance of glycemic control and long term complications from uncontrolled diabetes. Discussed with the patient the importance of compliance with home glucose monitoring, diet which includes decrease amount of sugary drinks and foods. Importance of exercise was also discussed with the patient. Importance of eye exams, self foot care and compliance to office visits was also discussed with the patient.  - metFORMIN (GLUCOPHAGE) 500 MG tablet; Take 1 tablet (500 mg total) by mouth 2 (two) times daily with a meal.  Dispense: 180 tablet; Refill: 3   The patient was encouraged to call or send a message through [facility] for any questions or concerns.   Follow up: if symptoms persist or do not get better.   Side effects and appropriate use of all the medication(s) were discussed with the patient today. Patient advised to use the medication(s) as directed by their healthcare provider. The patient was encouraged to read, review, and understand all associated package inserts and contact our office with any questions or concerns. The patient accepts the risks of the treatment plan and had an opportunity to ask questions.   Patient was given opportunity to ask  questions. Patient verbalized understanding of the plan and was able to repeat key elements of the plan. All questions were answered to their satisfaction.  Raman Jennier Schissler, DNP   I, Raman Amariyana Heacox have reviewed all documentation for this visit. The documentation on 04/20/21 for the exam, diagnosis, procedures, and orders are all accurate and complete.    IF YOU HAVE BEEN REFERRED TO A SPECIALIST, IT MAY TAKE 1-2 WEEKS TO SCHEDULE/PROCESS THE REFERRAL. IF YOU HAVE NOT HEARD FROM US/SPECIALIST IN TWO WEEKS, PLEASE GIVE [place] A CALL AT [phone] X [phone].   THE PATIENT IS ENCOURAGED TO PRACTICE SOCIAL DISTANCING DUE TO THE COVID-19 PANDEMIC.

## 2021-04-21 LAB — CBC
Hematocrit: 40.9 % (ref 34.0–46.6)
Hemoglobin: 13.6 g/dL (ref 11.1–15.9)
MCH: 29.8 pg (ref 26.6–33.0)
MCHC: 33.3 g/dL (ref 31.5–35.7)
MCV: 90 fL (ref 79–97)
Platelets: 261 10*3/uL (ref 150–450)
RBC: 4.57 x10E6/uL (ref 3.77–5.28)
RDW: 12.2 % (ref 11.7–15.4)
WBC: 5.2 10*3/uL (ref 3.4–10.8)

## 2021-04-21 LAB — CMP14+EGFR
ALT: 14 IU/L (ref 0–32)
AST: 15 IU/L (ref 0–40)
Albumin/Globulin Ratio: 1.8 (ref 1.2–2.2)
Albumin: 4.5 g/dL (ref 3.8–4.9)
Alkaline Phosphatase: 82 IU/L (ref 44–121)
BUN/Creatinine Ratio: 21 (ref 12–28)
BUN: 19 mg/dL (ref 8–27)
Bilirubin Total: 0.5 mg/dL (ref 0.0–1.2)
CO2: 23 mmol/L (ref 20–29)
Calcium: 9.8 mg/dL (ref 8.7–10.3)
Chloride: 107 mmol/L — ABNORMAL HIGH (ref 96–106)
Creatinine, Ser: 0.9 mg/dL (ref 0.57–1.00)
Globulin, Total: 2.5 g/dL (ref 1.5–4.5)
Glucose: 99 mg/dL (ref 65–99)
Potassium: 4.8 mmol/L (ref 3.5–5.2)
Sodium: 144 mmol/L (ref 134–144)
Total Protein: 7 g/dL (ref 6.0–8.5)
eGFR: 73 mL/min/{1.73_m2} (ref >59)

## 2021-04-28 ENCOUNTER — Other Ambulatory Visit: Payer: Self-pay | Admitting: Nurse Practitioner

## 2021-04-28 DIAGNOSIS — M797 Fibromyalgia: Secondary | ICD-10-CM

## 2021-05-01 ENCOUNTER — Encounter: Payer: Self-pay | Admitting: Nurse Practitioner

## 2021-05-01 ENCOUNTER — Telehealth: Payer: Self-pay | Admitting: Internal Medicine

## 2021-05-01 NOTE — Telephone Encounter (Signed)
Returned call to patient who states that she has been having intermittent chest discomfort since being discharged from the hospital 3 weeks ago. Patient states that the chest discomfort is more of an uneasy feeling and states it is hard describe. Patient also reports nausea and sweating this morning before she ate her breakfast, patient states this comes and goes. Patient is monitoring blood sugar closely. Patient reports that she is also short of breath on exertion that is about the same as before her heart cath. Patient has been unable to check her vital signs since discharge. Patient reports that she is taking all of her medications as prescribed on discharge. Patient states she does have anxiety and has not been sleeping at night. Patient reports that she has taken her Hydroxyzine for anxiety but reports that it has not helped. Patient states that she is just concerned about the intermittent chest discomfort and shortness of breath. Made patient an appointment to discuss symptoms on 8/16 with Coletta Memos at the [facility] office. Advised patient to also call her PCP for advice on anxiety and insomnia. Patient states she will do this. Advised patient of ED precautions should new or worsening symptoms develop. Patient verbalized understanding of all instructions.

## 2021-05-01 NOTE — Progress Notes (Signed)
Cardiology Office Note:    Date:  05/02/2021   ID:  Lisa Casey, DOB January 01, 1961, MRN [id number]  PCP:  [name], [place] Providers Cardiologist:  Elouise Munroe, MD      Referring MD: [name], FNP   Evaluation of chest discomfort  History of Present Illness:    Lisa Casey is a 60 y.o. female with a hx of obesity, anxiety, prediabetes, fibromyalgia, GERD, hypertension, short PR interval, former tobacco use and NSTEMI.  She presented to the emergency department on 04/15/2021 with central chest pain and abdominal pain that woke her from sleep.  She underwent chest CT which showed normal aorta, abnormal kidney suggesting nephro calcinosis, and some calcified coronary artery atherosclerosis.  She underwent cardiac catheterization on 04/17/2021 which showed normal coronary anatomy and no plaque.  MRI showed a small area of inferior lateral apical infarct.  It was felt to be likely to be related to small distal vessel occlusion secondary to diabetes.  She contacted the nurse triage line on 05/01/2021 with complaints of intermittent chest discomfort since discharge.  She reported that she was having uneasy feeling which was hard to describe.  She reported nausea and sweating.  Her symptoms would come and go.  She reported she was monitoring her blood sugar closely.  She was taking her hydroxyzine which has not helped with her symptoms.  She presents to the clinic today for follow-up evaluation states she continues to have intermittent episodes of chest discomfort.  She notes increased shortness of breath with increased physical activity.  We reviewed her cardiac catheterization, cardiac MRI, and echocardiogram.  She expressed understanding.  I will increase her amlodipine to 5 mg daily, have her continue her heart healthy diet, and increase physical activity.  She reports that she plans to join the [place] [employer].  We will have her return for follow-up in 3 months.   I will also provide her with a return to work note.  Today she denies  increased shortness of breath, lower extremity edema, fatigue, palpitations, melena, hematuria, hemoptysis, diaphoresis, weakness, presyncope, syncope, orthopnea, and PND.   Past Medical History:  Diagnosis Date   Anxiety    HX OF ANXIETY ATTACKS   Back pain, chronic    Cholelithiasis 04/15/2021   Diabetes ([place])    started Metformin in 02/2021   Fibromyalgia    GERD (gastroesophageal reflux disease)    Hypertension    Migraines    HX OF MIGRAINES   Obesity    Shortened PR interval     Past Surgical History:  Procedure Laterality Date   CONSTIPATION     CYSTOSCOPY W/ URETERAL STENT PLACEMENT  09/23/2012   Procedure: CYSTOSCOPY WITH RETROGRADE PYELOGRAM/URETERAL STENT PLACEMENT;  Surgeon: Alexis Frock, MD;  Location: [facility];  Service: Urology;  Laterality: Right;   CYSTOSCOPY WITH RETROGRADE PYELOGRAM, URETEROSCOPY AND STENT PLACEMENT  10/08/2012   Procedure: CYSTOSCOPY WITH RETROGRADE PYELOGRAM, URETEROSCOPY AND STENT PLACEMENT;  Surgeon: Alexis Frock, MD;  Location: [facility];  Service: Urology;  Laterality: Right;  90 MIN NO RETROGRADE NEEDS FLEX URETEROSCOPE    HOLMIUM LASER APPLICATION  5/82/5189   Procedure: HOLMIUM LASER APPLICATION;  Surgeon: Alexis Frock, MD;  Location: [facility];  Service: Urology;  Laterality: Right;   LEFT HEART CATH AND CORONARY ANGIOGRAPHY N/A 04/17/2021   Procedure: LEFT HEART CATH AND CORONARY ANGIOGRAPHY;  Surgeon: Burnell Blanks, MD;  Location: [facility];  Service: Cardiovascular;  Laterality: N/A;   LEFT HEART CATHETERIZATION WITH CORONARY ANGIOGRAM N/A 10/29/2011   Procedure: LEFT HEART CATHETERIZATION WITH CORONARY ANGIOGRAM;  Surgeon: Leonie Man, MD;  Location: [facility];  Service: Cardiovascular;  Laterality: N/A;   ROBOTIC ASSISTED LAP VAGINAL HYSTERECTOMY  09/14/2011   Procedure: ROBOTIC ASSISTED LAPAROSCOPIC  VAGINAL HYSTERECTOMY;  Surgeon: Agnes Lawrence, MD;  Location: [facility];  Service: Gynecology;  Laterality: N/A;   TUBAL LIGATION      Current Medications: Current Meds  Medication Sig   acetaminophen (TYLENOL) 500 MG tablet Take 1,000 mg by mouth every 6 (six) hours as needed for mild pain or headache.   albuterol (VENTOLIN HFA) 108 (90 Base) MCG/ACT inhaler Inhale 2 puffs into the lungs every 6 (six) hours as needed for wheezing or shortness of breath.   aspirin EC 81 MG EC tablet Take 1 tablet (81 mg total) by mouth daily. Swallow whole.   Blood Glucose Monitoring Suppl (ONETOUCH VERIO) w/Device KIT Use as directed to check blood sugars 2 times per day dx: e11.65   clopidogrel (PLAVIX) 75 MG tablet Take 1 tablet (75 mg total) by mouth daily.   cyclobenzaprine (FLEXERIL) 5 MG tablet Take 1 tablet by mouth three times daily as needed for muscle spasm   fluticasone (FLONASE) 50 MCG/ACT nasal spray Place 2 sprays into both nostrils daily.   glucose blood (ONETOUCH VERIO) test strip Use as directed to check blood sugars 2 times per day dx: e11.65   hydrOXYzine (VISTARIL) 25 MG capsule TAKE 1 CAPSULE BY MOUTH EVERY 6 HOURS AS NEEDED   levocetirizine (XYZAL) 5 MG tablet Take 1 tablet (5 mg total) by mouth every evening.   lisdexamfetamine (VYVANSE) 20 MG capsule Take 1 capsule (20 mg total) by mouth daily.   metFORMIN (GLUCOPHAGE) 500 MG tablet Take 1 tablet (500 mg total) by mouth 2 (two) times daily with a meal.   metoprolol tartrate (LOPRESSOR) 25 MG tablet Take 0.5 tablets (12.5 mg total) by mouth 2 (two) times daily.   neomycin-polymyxin b-dexamethasone (MAXITROL) 3.5-10000-0.1 OINT Place 1 application into both eyes at bedtime as needed (eye irritation).   OneTouch Delica Lancets 68E MISC Use as directed to check blood sugars 2 times per day dx: e11.65   PARoxetine (PAXIL) 20 MG tablet Take 1 tablet by mouth once daily   pregabalin (LYRICA) 150 MG capsule Take 1 capsule (150 mg total) by  mouth 2 (two) times daily.   Probiotic Product (PROBIOTIC PO) Take 1 capsule by mouth daily.   RESTASIS 0.05 % ophthalmic emulsion Place 1 drop into both eyes in the morning and at bedtime.   [DISCONTINUED] amLODipine (NORVASC) 2.5 MG tablet Take 1 tablet by mouth once daily     Allergies:   Codeine   Social History   Socioeconomic History   Marital status: Divorced    Spouse name: Not on file   Number of children: Not on file   Years of education: Not on file   Highest education level: Not on file  Occupational History   Occupation: school bus driver  Tobacco Use   Smoking status: Former    Packs/day: 0.25    Years: 10.00    Pack years: 2.50    Types: Cigarettes   Smokeless tobacco: Never  Substance and Sexual Activity   Alcohol use: No   Drug use: No   Sexual activity: Yes    Birth control/protection: Other-see comments    Comment: hysterectomy  Other Topics Concern   Not on  file  Social History Narrative   Not on file   Social Determinants of Health   Financial Resource Strain: Not on file  Food Insecurity: Not on file  Transportation Needs: Not on file  Physical Activity: Not on file  Stress: Not on file  Social Connections: Not on file     Family History: The patient's urine family history includes Breast cancer in her paternal grandmother; Breast cancer (age of onset: 38) in her mother. There is no history of Heart failure or Coronary artery disease.  ROS:   Please see the history of present illness.     all other systems reviewed and are negative.   Risk Assessment/Calculations:           Physical Exam:    VS:  BP (!) 124/8   Pulse 61   Ht 5\' 5"  (1.651 m)   Wt 206 lb 3.2 oz (93.5 kg)   LMP 08/18/2011   SpO2 96%   BMI 34.31 kg/m     Wt Readings from Last 3 Encounters:  05/02/21 206 lb 3.2 oz (93.5 kg)  04/20/21 208 lb 6.4 oz (94.5 kg)  04/17/21 207 lb 3.7 oz (94 kg)     GEN:  Well nourished, well developed in no acute distress HEENT:  Normal NECK: No JVD; No carotid bruits LYMPHATICS: No lymphadenopathy CARDIAC: RRR, no murmurs, rubs, gallops RESPIRATORY:  Clear to auscultation without rales, wheezing or rhonchi  ABDOMEN: Soft, non-tender, non-distended MUSCULOSKELETAL:  No edema; No deformity  SKIN: Warm and dry NEUROLOGIC:  Alert and oriented x 3 PSYCHIATRIC:  Normal affect    EKGs/Labs/Other Studies Reviewed:    The following studies were reviewed today:  Cardiac catheterization 04/17/2021 No angiographic evidence of CAD LVEDP 15-17 mmHg   Recommendations: No further ischemic testing. Consider coronary vasospasm as the etiology of her chest pain and elevated troponin.   Cardiac MRI 04/18/2021 FINDINGS: LEFT VENTRICLE:   Normal left ventricular chamber size.   Normal left ventricular wall thickness.   Normal left ventricular systolic function.   LVEF = 69%   There are no regional wall motion abnormalities.   Focal myocardial edema, T2 = 61 msec in the inferolateral apex. Otherwise normal T2 values in myocardium.   Normal first pass perfusion.   There is post contrast delayed myocardial enhancement. There is subendocardial LGE in the lateral apex, appears less than 50% of myocardial thickness. This finding and localization is best visualized in series 38 (Hi Res PSIR). This finding most consistent with small area of infarct with preserved viability.   Normal T1 myocardial nulling kinetics suggest against a diagnosis of cardiac amyloidosis.   ECV = 25%, normal range   RIGHT VENTRICLE:   Normal right ventricular chamber size.   Normal right ventricular wall thickness.   Normal right ventricular systolic function.   RVEF = 58%   There are no regional wall motion abnormalities.   No post contrast delayed myocardial enhancement.   ATRIA:   Normal left atrial size.   Normal right atrial size.   VALVES:   No significant valvular abnormalities.   Flow quantitation of aortic valve  shows no significant aortic valve regurgitation, likely trivial.   PERICARDIUM:   Normal pericardium.  No pericardial effusion.   OTHER: No significant extracardiac findings.   MEASUREMENTS: Left ventricle:   LV Female   LV EF: 69% (normal 56-78%)   Absolute volumes:   LV EDV: 06/18/2021 (Normal 52-141 mL)   LV ESV: 53mL (Normal 13-51 mL)  LV SV: 70mL (Normal 33-97 mL)   CO: 4.8L/min (Normal 2.7-6.0 L/min)   Indexed volumes:   LV EDV: 19mL/sq-m (Normal 41-81 mL/sq-m)   LV ESV: 66mL/sq-m (Normal 12-21 mL/sq-m)   LV SV: 38mL/sq-m (Normal 26-56 mL/sq-m)   CI: 2.33L/min/sq-m (normal 1.8-3.8 L/min/sq-m)   Right ventricle:   RV female   RV EF: 58% (Normal 47-80%)   Absolute volumes:   RV EDV: 127 mL (Normal 58-154 mL)   RV ESV: 53 mL (Normal 12-68 mL)   RV SV: 74 mL (Normal 35-98 mL)   CO: 4.3 L/min (Normal 2.7-6 L/min)   Indexed volumes:   RV EDV: 61 ML/sq-m (Normal 48-87 mL/sq-m)   RV ESV: 25 mL/sq-m (Normal 11-28 mL/sq-m)   RV SV: 36 mL/sq-m (Normal 27-57 mL/sq-m)   CI: 2.07 L/min/sq-m (Normal 1.8-3.8 L/min/sq-m)   IMPRESSION: 1. Normal biventricular chamber size and function. LVEF 69%, RVEF 58%.   2. Study performed to evaluate source of elevated troponin and chest pain with grossly normal epicardial coronary arteries. There is small area of myocardial edema and subendocardial delayed enhancement in the inferolateral LV apex, consistent with infarction. Findings less suggestive of myocarditis.      EKG:  EKG is  ordered today.  The ekg ordered today demonstrates normal sinus rhythm nonspecific T wave abnormality 61 bpm  Recent Labs: 04/15/2021: Magnesium 2.0 04/18/2021: TSH 2.237 04/20/2021: ALT 14; BUN 19; Creatinine, Ser 0.90; Hemoglobin 13.6; Platelets 261; Potassium 4.8; Sodium 144  Recent Lipid Panel    Component Value Date/Time   CHOL 144 04/16/2021 0051   CHOL 148 11/03/2019 1708   TRIG 81 04/16/2021 0051   HDL 61 04/16/2021 0051    HDL 62 11/03/2019 1708   CHOLHDL 2.4 04/16/2021 0051   VLDL 16 04/16/2021 0051   LDLCALC 67 04/16/2021 0051   LDLCALC 66 11/03/2019 1708    ASSESSMENT & PLAN    NSTEMI/chest discomfort-no chest pain today.  Contacted nurse triage line on 04/30/2021 with complaints of intermittent episodes of chest discomfort.  She had taken her hydroxyzine with no relief.  Patient underwent cardiac catheterization which showed no coronary obstruction.  She underwent cardiac MRI which showed small area of myocardial edema and subendocardial delayed enhancement in the inferolateral LV apex, consistent with infarction. Continue amlodipine, aspirin, Plavix, metoprolol Heart healthy low-sodium diet-salty 6 given Increase physical activity as tolerated Reassured that her continued symptoms were not related to cardiac causes.  Anxiety-chronic but stable.  Tried hydroxyzine with little symptom relief. Continue Paxil Heart healthy low-sodium diet-salty 6 given Increase physical activity as tolerated  Insomnia-reports she is only sleeping around 4 hours per night. Sleep hygiene instructions Increase physical activity May take melatonin 5 mg as needed for sleep Disposition: Follow-up with Dr.Acharya in 4 to 6 months.       Medication Adjustments/Labs and Tests Ordered: Current medicines are reviewed at length with the patient today.  Concerns regarding medicines are outlined above.  Orders Placed This Encounter  Procedures   EKG 12-Lead   Meds ordered this encounter  Medications   amLODipine (NORVASC) 5 MG tablet    Sig: Take 1 tablet (5 mg total) by mouth daily.    Dispense:  90 tablet    Refill:  3    There are no Patient Instructions on file for this visit.   Signed, Deberah Pelton, NP  05/02/2021 4:02 PM      Notice: This dictation was prepared with Dragon dictation along with smaller phrase technology. Any transcriptional errors that  result from this process are unintentional and may not  be corrected upon review.  I spent 13 minutes examining this patient, reviewing medications, and using patient centered shared decision making involving her cardiac care.  Prior to her visit I spent greater than 20 minutes reviewing her past medical history,  medications, and prior cardiac tests.

## 2021-05-01 NOTE — Telephone Encounter (Signed)
Pt c/o of Chest Pain: 1. Are you having CP right now? No  2. Are you experiencing any other symptoms (ex. SOB, nausea, vomiting, sweating)? Sweating nausea 3. How long have you been experiencing CP? Patient had a heart attack 3 weeks ago  4. Is your CP continuous or coming and going? Discomfort comes and gose 5. Have you taken Nitroglycerin? No     Pt c/o Shortness Of Breath: STAT if SOB developed within the last 24 hours or pt is noticeably SOB on the phone  1. Are you currently SOB (can you hear that pt is SOB on the phone)? No not now and I can not hear the patient   2. How long have you been experiencing SOB? Since before heart attack  3. Are you SOB when sitting or when up moving around? When moving around  4. Are you currently experiencing any other symptoms? Weakness and not feeling well at all.

## 2021-05-02 ENCOUNTER — Encounter (HOSPITAL_BASED_OUTPATIENT_CLINIC_OR_DEPARTMENT_OTHER): Payer: Self-pay | Admitting: General Practice

## 2021-05-02 ENCOUNTER — Ambulatory Visit (HOSPITAL_BASED_OUTPATIENT_CLINIC_OR_DEPARTMENT_OTHER): Payer: BC Managed Care – PPO | Admitting: General Practice

## 2021-05-02 ENCOUNTER — Other Ambulatory Visit: Payer: Self-pay

## 2021-05-02 ENCOUNTER — Encounter (HOSPITAL_BASED_OUTPATIENT_CLINIC_OR_DEPARTMENT_OTHER): Payer: Self-pay | Admitting: *Deleted

## 2021-05-02 VITALS — BP 124/8 | HR 61 | Ht 65.0 in | Wt 206.2 lb

## 2021-05-02 DIAGNOSIS — I214 Non-ST elevation (NSTEMI) myocardial infarction: Secondary | ICD-10-CM | POA: Diagnosis not present

## 2021-05-02 DIAGNOSIS — F419 Anxiety disorder, unspecified: Secondary | ICD-10-CM

## 2021-05-02 MED ORDER — AMLODIPINE BESYLATE 5 MG PO TABS: 5.0000 mg | ORAL_TABLET | Freq: Every day | ORAL | 3 refills | Status: DC

## 2021-05-02 NOTE — Patient Instructions (Signed)
Medication Instructions:   INCREASE Amlodipine one tablet by mouth ( '5mg'$  ) daily.   You can try melatonin one tablet ( 5 mg) at night as needed for sleep OTC.   *If you need a refill on your cardiac medications before your next appointment, please call your pharmacy*   Lab Work:  NONE  If you have labs (blood work) drawn today and your tests are completely normal, you will receive your results only by: [place] (if you have MyChart) OR A paper copy in the mail If you have any lab test that is abnormal or we need to change your treatment, we will call you to review the results.   Testing/Procedures:  -NONE-   Follow-Up: At [facility], you and your health needs are our priority.  As part of our continuing mission to provide you with exceptional heart care, we have created designated Provider Care Teams.  These Care Teams include your primary Cardiologist (physician) and Advanced Practice Providers (APPs -  Physician Assistants and Nurse Practitioners) who all work together to provide you with the care you need, when you need it.  We recommend signing up for the patient portal called "MyChart".  Sign up information is provided on this After Visit Summary.  MyChart is used to connect with patients for Virtual Visits (Telemedicine).  Patients are able to view lab/test results, encounter notes, upcoming appointments, etc.  Non-urgent messages can be sent to your provider as well.   To learn more about what you can do with MyChart, go to [email or web address].    Your next appointment:   3 month(s) Thursday, October 20 @ 2:40 pm.   The format for your next appointment:   In Person  Provider:   Cherlynn Kaiser, MD   Other Instructions Quality Sleep Information, Adult Quality sleep is important for your mental and physical health. It also improves your quality of life. Quality sleep means you: Are asleep for most of the time you are in bed. Fall asleep within 30  minutes. Wake up no more than once a night.  Are awake for no longer than 20 minutes if you do wake up during the night. Most adults need 7-8 hours of quality sleep each night. How can poor sleep affect me? If you do not get enough quality sleep, you may have: Mood swings. Daytime sleepiness. Confusion. Decreased reaction time. Sleep disorders, such as insomnia and sleep apnea. Difficulty with: Solving problems. Coping with stress. Paying attention. These issues may affect your performance and productivity at work, school, and at home. Lack of sleep may also put you at higher risk for accidents, suicide,and risky behaviors. If you do not get quality sleep you may also be at higher risk for several health problems, including: Infections. Type 2 diabetes. Heart disease. High blood pressure. Obesity. Worsening of long-term conditions, like arthritis, kidney disease, depression, Parkinson's disease, and epilepsy. What actions can I take to get more quality sleep?     Stick to a sleep schedule. Go to sleep and wake up at about the same time each day. Do not try to sleep less on weekdays and make up for lost sleep on weekends. This does not work. Try to get about 30 minutes of exercise on most days. Do not exercise 2-3 hours before going to bed. Limit naps during the day to 30 minutes or less. Do not use any products that contain nicotine or tobacco, such as cigarettes or e-cigarettes. If you need help quitting, ask your health  care provider. Do not drink caffeinated beverages for at least 8 hours before going to bed. Coffee, tea, and some sodas contain caffeine. Do not drink alcohol close to bedtime. Do not eat large meals close to bedtime. Do not take naps in the late afternoon. Try to get at least 30 minutes of sunlight every day. Morning sunlight is best. Make time to relax before bed. Reading, listening to music, or taking a hot bath promotes quality sleep. Make your bedroom a  place that promotes quality sleep. Keep your bedroom dark, quiet, and at a comfortable room temperature. Make sure your bed is comfortable. Take out sleep distractions like TV, a computer, smartphone, and bright lights. If you are lying awake in bed for longer than 20 minutes, get up and do a relaxing activity until you feel sleepy. Work with your health care provider to treat medical conditions that may affect sleeping, such as: Nasal obstruction. Snoring. Sleep apnea and other sleep disorders. Talk to your health care provider if you think any of your prescription medicines may cause you to have difficulty falling or staying asleep. If you have sleep problems, talk with a sleep consultant. If you think you have a sleep disorder, talk with your health care provider about getting evaluated by a specialist. Where to find more information [place] website: https://sleepfoundation.org National Heart, Lung, and [place] ([place]): http://www.saunders.info/.pdf Centers for Disease Control and Prevention (CDC): LearningDermatology.pl Contact a health care provider if you: Have trouble getting to sleep or staying asleep. Often wake up very early in the morning and cannot get back to sleep. Have daytime sleepiness. Have daytime sleep attacks of suddenly falling asleep and sudden muscle weakness (narcolepsy). Have a tingling sensation in your legs with a strong urge to move your legs (restless legs syndrome). Stop breathing briefly during sleep (sleep apnea). Think you have a sleep disorder or are taking a medicine that is affecting your quality of sleep. Summary Most adults need 7-8 hours of quality sleep each night. Getting enough quality sleep is an important part of health and well-being. Make your bedroom a place that promotes quality sleep and avoid things that may cause you to have poor sleep, such as alcohol, caffeine, smoking, and large  meals. Talk to your health care provider if you have trouble falling asleep or staying asleep. This information is not intended to replace advice given to you by your health care provider. Make sure you discuss any questions you have with your healthcare provider. Document Revised: 12/11/2017 Document Reviewed: 12/11/2017 Elsevier Patient Education  [place].

## 2021-05-03 ENCOUNTER — Ambulatory Visit: Payer: BC Managed Care – PPO | Admitting: Nurse Practitioner

## 2021-05-04 ENCOUNTER — Encounter: Payer: Self-pay | Admitting: Nurse Practitioner

## 2021-05-04 ENCOUNTER — Ambulatory Visit: Payer: BC Managed Care – PPO | Admitting: Nurse Practitioner

## 2021-05-04 ENCOUNTER — Other Ambulatory Visit: Payer: Self-pay

## 2021-05-04 VITALS — BP 136/70 | HR 69 | Temp 98.3°F | Ht 65.0 in | Wt 205.6 lb

## 2021-05-04 DIAGNOSIS — F419 Anxiety disorder, unspecified: Secondary | ICD-10-CM

## 2021-05-04 DIAGNOSIS — G4700 Insomnia, unspecified: Secondary | ICD-10-CM | POA: Diagnosis not present

## 2021-05-04 DIAGNOSIS — M797 Fibromyalgia: Secondary | ICD-10-CM

## 2021-05-04 MED ORDER — AMITRIPTYLINE HCL 10 MG PO TABS: 10.0000 mg | ORAL_TABLET | Freq: Every day | ORAL | 2 refills | Status: DC

## 2021-05-04 NOTE — Patient Instructions (Signed)

## 2021-05-04 NOTE — Progress Notes (Signed)
I,[name],acting as a Education administrator for [employer], FNP.,have documented all relevant documentation on the behalf of Lisa Brine, FNP,as directed by  Lisa Brine, FNP while in the presence of Lisa Casey, [place].   This visit occurred during the SARS-CoV-2 public health emergency.  Safety protocols were in place, including screening questions prior to the visit, additional usage of staff PPE, and extensive cleaning of exam room while observing appropriate contact time as indicated for disinfecting solutions.  Subjective:     Patient ID: Lisa Casey , female    DOB: 21-Jul-1961 , 60 y.o.   MRN: [id number]   Chief Complaint  Patient presents with   Anxiety   Insomnia    HPI  The patient is here today for an evaluation for anxiety and insomnia. She is reporting she will only get 5 hours of sleep. Difficulty with going to sleep and staying asleep. She is having heaviness to the center of her chest.  She feels her brain is still working when she tries to go to sleep at night  She is trying to eat better. She does feel like coming off the coffee has helped her. Wt Readings from Last 3 Encounters: 05/04/21 : 205 lb 9.6 oz (93.3 kg) 05/02/21 : 206 lb 3.2 oz (93.5 kg) 04/20/21 : 208 lb 6.4 oz (94.5 kg)    Anxiety Presents for follow-up visit. Symptoms include decreased concentration, insomnia and nervous/anxious behavior. Patient reports no chest pain, dizziness or palpitations. The quality of sleep is poor. Nighttime awakenings: one to two, several (2-3 times).    Insomnia Primary symptoms: sleep disturbance.   Typical bedtime:  11-12 P.M. (she will sleep until 5 am and will have to come home on her break to take a nap).  PMH includes: no hypertension.     Past Medical History:  Diagnosis Date   Anxiety    HX OF ANXIETY ATTACKS   Back pain, chronic    Cholelithiasis 04/15/2021   Diabetes ([place])    started Metformin in 02/2021   Fibromyalgia    GERD (gastroesophageal reflux  disease)    Hypertension    Migraines    HX OF MIGRAINES   Obesity    Shortened PR interval      Family History  Problem Relation Age of Onset   Breast cancer Mother 16   Breast cancer Paternal Grandmother    Heart failure Neg Hx    Coronary artery disease Neg Hx      Current Outpatient Medications:    acetaminophen (TYLENOL) 500 MG tablet, Take 1,000 mg by mouth every 6 (six) hours as needed for mild pain or headache., Disp: , Rfl:    albuterol (VENTOLIN HFA) 108 (90 Base) MCG/ACT inhaler, Inhale 2 puffs into the lungs every 6 (six) hours as needed for wheezing or shortness of breath., Disp: 8 g, Rfl: 2   amitriptyline (ELAVIL) 10 MG tablet, Take 1 tablet (10 mg total) by mouth at bedtime., Disp: 30 tablet, Rfl: 2   amLODipine (NORVASC) 5 MG tablet, Take 1 tablet (5 mg total) by mouth daily., Disp: 90 tablet, Rfl: 3   aspirin EC 81 MG EC tablet, Take 1 tablet (81 mg total) by mouth daily. Swallow whole., Disp: 30 tablet, Rfl: 11   Blood Glucose Monitoring Suppl (ONETOUCH VERIO) w/Device KIT, Use as directed to check blood sugars 2 times per day dx: e11.65, Disp: 1 kit, Rfl: 1   clopidogrel (PLAVIX) 75 MG tablet, Take 1 tablet (75 mg total) by mouth  daily., Disp: 30 tablet, Rfl: 3   cyclobenzaprine (FLEXERIL) 5 MG tablet, Take 1 tablet by mouth three times daily as needed for muscle spasm, Disp: 40 tablet, Rfl: 0   fluticasone (FLONASE) 50 MCG/ACT nasal spray, Place 2 sprays into both nostrils daily., Disp: 16 g, Rfl: 0   glucose blood (ONETOUCH VERIO) test strip, Use as directed to check blood sugars 2 times per day dx: e11.65, Disp: 100 each, Rfl: 5   hydrOXYzine (VISTARIL) 25 MG capsule, TAKE 1 CAPSULE BY MOUTH EVERY 6 HOURS AS NEEDED, Disp: 30 capsule, Rfl: 0   levocetirizine (XYZAL) 5 MG tablet, Take 1 tablet (5 mg total) by mouth every evening., Disp: 90 tablet, Rfl: 1   metFORMIN (GLUCOPHAGE) 500 MG tablet, Take 1 tablet (500 mg total) by mouth 2 (two) times daily with a meal.,  Disp: 180 tablet, Rfl: 3   metoprolol tartrate (LOPRESSOR) 25 MG tablet, Take 0.5 tablets (12.5 mg total) by mouth 2 (two) times daily., Disp: 60 tablet, Rfl: 0   neomycin-polymyxin b-dexamethasone (MAXITROL) 3.5-10000-0.1 OINT, Place 1 application into both eyes at bedtime as needed (eye irritation)., Disp: , Rfl:    OneTouch Delica Lancets 38V MISC, Use as directed to check blood sugars 2 times per day dx: e11.65, Disp: 100 each, Rfl: 5   pregabalin (LYRICA) 150 MG capsule, Take 1 capsule (150 mg total) by mouth 2 (two) times daily., Disp: 60 capsule, Rfl: 3   Probiotic Product (PROBIOTIC PO), Take 1 capsule by mouth daily., Disp: , Rfl:    RESTASIS 0.05 % ophthalmic emulsion, Place 1 drop into both eyes in the morning and at bedtime., Disp: , Rfl:    Allergies  Allergen Reactions   Codeine Itching    With rash     Review of Systems  Constitutional: Negative.   Eyes: Negative.   Respiratory: Negative.  Negative for cough.   Cardiovascular: Negative.  Negative for chest pain, palpitations and leg swelling.  Skin: Negative.   Neurological:  Negative for dizziness and headaches.  Psychiatric/Behavioral:  Positive for decreased concentration and sleep disturbance. Negative for agitation. The patient is nervous/anxious and has insomnia.     Today's Vitals   05/04/21 1529  BP: 136/70  Pulse: 69  Temp: 98.3 F (36.8 C)  TempSrc: Oral  Weight: 205 lb 9.6 oz (93.3 kg)  Height: _0  (1.651 m)   Body mass index is 34.21 kg/m.  Wt Readings from Last 3 Encounters:  05/04/21 205 lb 9.6 oz (93.3 kg)  05/02/21 206 lb 3.2 oz (93.5 kg)  04/20/21 208 lb 6.4 oz (94.5 kg)    BP Readings from Last 3 Encounters:  05/04/21 136/70  05/02/21 (!) 124/8  04/20/21 126/84    Objective:  Physical Exam Vitals reviewed.  Constitutional:      General: She is not in acute distress.    Appearance: Normal appearance.  Cardiovascular:     Rate and Rhythm: Normal rate and regular rhythm.     Pulses:  Normal pulses.     Heart sounds: Normal heart sounds. No murmur heard. Pulmonary:     Effort: Pulmonary effort is normal. No respiratory distress.     Breath sounds: Normal breath sounds. No wheezing.  Neurological:     General: No focal deficit present.     Mental Status: She is alert and oriented to person, place, and time.     Cranial Nerves: No cranial nerve deficit.     Motor: No weakness.  Psychiatric:  Mood and Affect: Mood normal.        Behavior: Behavior normal.        Thought Content: Thought content normal.        Judgment: Judgment normal.        Assessment And Plan:     1. Anxiety Comments: Recent MI,  Will start her on amitriptyline this may help with her insomnia as well  - amitriptyline (ELAVIL) 10 MG tablet; Take 1 tablet (10 mg total) by mouth at bedtime.  Dispense: 30 tablet; Refill: 2 - Ambulatory referral to Psychiatry  2. Insomnia, unspecified type Comments: Hopes the amitriptyline will be helpful for her sleep as well - amitriptyline (ELAVIL) 10 MG tablet; Take 1 tablet (10 mg total) by mouth at bedtime.  Dispense: 30 tablet; Refill: 2 - Ambulatory referral to Psychiatry  3. Fibromyalgia   [employer].  We are stopping the paroxetine and starting amitriptylline She is exercising more and cut back on bad foods.  Long discussion about her anxiety and insomnia and her recent MI.   I personally spent 25 minutes face-to-face and non-face-to-face in the care of this patient, which includes all pre-, intra-, and post visit time on the date of service.   Patient was given opportunity to ask questions. Patient verbalized understanding of the plan and was able to repeat key elements of the plan. All questions were answered to their satisfaction.  Lisa Brine, FNP   I, Lisa Brine, FNP, have reviewed all documentation for this visit. The documentation on 05/14/21 for the exam, diagnosis, procedures, and orders are all accurate and complete.   IF YOU  HAVE BEEN REFERRED TO A SPECIALIST, IT MAY TAKE 1-2 WEEKS TO SCHEDULE/PROCESS THE REFERRAL. IF YOU HAVE NOT HEARD FROM US/SPECIALIST IN TWO WEEKS, PLEASE GIVE [place] A CALL AT [phone].   THE PATIENT IS ENCOURAGED TO PRACTICE SOCIAL DISTANCING DUE TO THE COVID-19 PANDEMIC.

## 2021-05-05 ENCOUNTER — Encounter: Payer: Self-pay | Admitting: Nurse Practitioner

## 2021-05-08 ENCOUNTER — Encounter: Payer: Self-pay | Admitting: Nurse Practitioner

## 2021-05-09 ENCOUNTER — Encounter: Payer: Self-pay | Admitting: Nurse Practitioner

## 2021-05-11 ENCOUNTER — Encounter: Payer: Self-pay | Admitting: Nurse Practitioner

## 2021-05-15 ENCOUNTER — Other Ambulatory Visit: Payer: Self-pay | Admitting: Nurse Practitioner

## 2021-05-15 DIAGNOSIS — M797 Fibromyalgia: Secondary | ICD-10-CM

## 2021-05-18 ENCOUNTER — Other Ambulatory Visit: Payer: Self-pay | Admitting: Nurse Practitioner

## 2021-05-18 DIAGNOSIS — M797 Fibromyalgia: Secondary | ICD-10-CM

## 2021-05-19 ENCOUNTER — Ambulatory Visit: Payer: BC Managed Care – PPO | Admitting: Internal Medicine

## 2021-05-23 ENCOUNTER — Encounter: Payer: Self-pay | Admitting: Nurse Practitioner

## 2021-05-25 ENCOUNTER — Other Ambulatory Visit: Payer: Self-pay

## 2021-05-25 ENCOUNTER — Encounter (HOSPITAL_BASED_OUTPATIENT_CLINIC_OR_DEPARTMENT_OTHER): Payer: Self-pay | Admitting: *Deleted

## 2021-05-25 ENCOUNTER — Other Ambulatory Visit (HOSPITAL_COMMUNITY)
Admission: EM | Admit: 2021-05-25 | Discharge: 2021-05-25 | Disposition: A | Discharge status: Home / Self Care | Payer: BC Managed Care – PPO | Attending: Behavioral Health | Admitting: Behavioral Health

## 2021-05-25 ENCOUNTER — Emergency Department (HOSPITAL_BASED_OUTPATIENT_CLINIC_OR_DEPARTMENT_OTHER)
Admission: EM | Admit: 2021-05-25 | Discharge: 2021-05-25 | Disposition: A | Discharge status: Other Acute Inpatient Hospital | Payer: BC Managed Care – PPO | Attending: Emergency Medicine | Admitting: Emergency Medicine

## 2021-05-25 DIAGNOSIS — E119 Type 2 diabetes mellitus without complications: Secondary | ICD-10-CM | POA: Insufficient documentation

## 2021-05-25 DIAGNOSIS — Z79899 Other long term (current) drug therapy: Secondary | ICD-10-CM | POA: Insufficient documentation

## 2021-05-25 DIAGNOSIS — F419 Anxiety disorder, unspecified: Secondary | ICD-10-CM | POA: Diagnosis present

## 2021-05-25 DIAGNOSIS — Z20822 Contact with and (suspected) exposure to covid-19: Secondary | ICD-10-CM | POA: Diagnosis not present

## 2021-05-25 DIAGNOSIS — Z7982 Long term (current) use of aspirin: Secondary | ICD-10-CM | POA: Insufficient documentation

## 2021-05-25 DIAGNOSIS — F329 Major depressive disorder, single episode, unspecified: Secondary | ICD-10-CM | POA: Insufficient documentation

## 2021-05-25 DIAGNOSIS — Z87891 Personal history of nicotine dependence: Secondary | ICD-10-CM | POA: Insufficient documentation

## 2021-05-25 DIAGNOSIS — Z7902 Long term (current) use of antithrombotics/antiplatelets: Secondary | ICD-10-CM | POA: Insufficient documentation

## 2021-05-25 DIAGNOSIS — Z7984 Long term (current) use of oral hypoglycemic drugs: Secondary | ICD-10-CM | POA: Insufficient documentation

## 2021-05-25 DIAGNOSIS — F4322 Adjustment disorder with anxiety: Secondary | ICD-10-CM | POA: Diagnosis not present

## 2021-05-25 DIAGNOSIS — I10 Essential (primary) hypertension: Secondary | ICD-10-CM | POA: Insufficient documentation

## 2021-05-25 LAB — CBC WITH DIFFERENTIAL/PLATELET
Abs Immature Granulocytes: 0 10*3/uL (ref 0.00–0.07)
Basophils Absolute: 0 10*3/uL (ref 0.0–0.1)
Basophils Relative: 1 %
Eosinophils Absolute: 0 10*3/uL (ref 0.0–0.5)
Eosinophils Relative: 1 %
HCT: 41.5 % (ref 36.0–46.0)
Hemoglobin: 13.8 g/dL (ref 12.0–15.0)
Immature Granulocytes: 0 %
Lymphocytes Relative: 49 %
Lymphs Abs: 1.9 10*3/uL (ref 0.7–4.0)
MCH: 30 pg (ref 26.0–34.0)
MCHC: 33.3 g/dL (ref 30.0–36.0)
MCV: 90.2 fL (ref 80.0–100.0)
Monocytes Absolute: 0.3 10*3/uL (ref 0.1–1.0)
Monocytes Relative: 7 %
Neutro Abs: 1.7 10*3/uL (ref 1.7–7.7)
Neutrophils Relative %: 42 %
Platelets: 250 10*3/uL (ref 150–400)
RBC: 4.6 MIL/uL (ref 3.87–5.11)
RDW: 12.1 % (ref 11.5–15.5)
WBC: 3.9 10*3/uL — ABNORMAL LOW (ref 4.0–10.5)
nRBC: 0 % (ref 0.0–0.2)

## 2021-05-25 LAB — BASIC METABOLIC PANEL
Anion gap: 10 (ref 5–15)
BUN: 18 mg/dL (ref 6–20)
CO2: 27 mmol/L (ref 22–32)
Calcium: 9.9 mg/dL (ref 8.9–10.3)
Chloride: 104 mmol/L (ref 98–111)
Creatinine, Ser: 0.72 mg/dL (ref 0.44–1.00)
GFR, Estimated: >60 mL/min (ref >60)
Glucose, Bld: 100 mg/dL — ABNORMAL HIGH (ref 70–99)
Potassium: 3.8 mmol/L (ref 3.5–5.1)
Sodium: 141 mmol/L (ref 135–145)

## 2021-05-25 LAB — ACETAMINOPHEN LEVEL: Acetaminophen (Tylenol), Serum: <10 ug/mL — ABNORMAL LOW (ref 10–30)

## 2021-05-25 LAB — SALICYLATE LEVEL: Salicylate Lvl: <7 mg/dL — ABNORMAL LOW (ref 7.0–30.0)

## 2021-05-25 LAB — RESP PANEL BY RT-PCR (FLU A&B, COVID) ARPGX2
Influenza A by PCR: NEGATIVE
Influenza B by PCR: NEGATIVE
SARS Coronavirus 2 by RT PCR: NEGATIVE

## 2021-05-25 LAB — ETHANOL: Alcohol, Ethyl (B): <10 mg/dL (ref <10)

## 2021-05-25 MED ORDER — LEVOCETIRIZINE DIHYDROCHLORIDE 5 MG PO TABS: 5.0000 mg | ORAL_TABLET | Freq: Every evening | ORAL | Status: DC

## 2021-05-25 MED ORDER — LORATADINE 10 MG PO TABS: 10.0000 mg | ORAL_TABLET | Freq: Every evening | ORAL | Status: DC

## 2021-05-25 MED ORDER — LORAZEPAM 1 MG PO TABS
1.0000 mg | ORAL_TABLET | Freq: Once | ORAL | Status: AC
  Administered 2021-05-25: 1 mg via ORAL
  Filled 2021-05-25: qty 1

## 2021-05-25 MED ORDER — METFORMIN HCL 500 MG PO TABS: 500.0000 mg | ORAL_TABLET | Freq: Two times a day (BID) | ORAL | Status: DC

## 2021-05-25 MED ORDER — AMITRIPTYLINE HCL 10 MG PO TABS: 10.0000 mg | ORAL_TABLET | Freq: Every day | ORAL | Status: DC

## 2021-05-25 MED ORDER — METOPROLOL TARTRATE 25 MG PO TABS: 12.5000 mg | ORAL_TABLET | Freq: Two times a day (BID) | ORAL | Status: DC

## 2021-05-25 MED ORDER — ASPIRIN EC 81 MG PO TBEC: 81.0000 mg | DELAYED_RELEASE_TABLET | Freq: Every day | ORAL | Status: DC

## 2021-05-25 MED ORDER — AMLODIPINE BESYLATE 5 MG PO TABS: 5.0000 mg | ORAL_TABLET | Freq: Every day | ORAL | Status: DC

## 2021-05-25 MED ORDER — CLOPIDOGREL BISULFATE 75 MG PO TABS: 75.0000 mg | ORAL_TABLET | Freq: Every day | ORAL | Status: DC

## 2021-05-25 NOTE — ED Notes (Signed)
Pt up to nurse's station asking if she can pack her stuff and leave. Confirmed with Darrol Angel, NP that pt can be discharged. Pt A&O x4, ambulatory. Denies SI/HI/AVH. Belongings from locker #15 returned to pt. Pt discharged in no acute distress. Verbalized understanding of AVS instructions reviewed by RN. Pt escorted to front lobby. Safety maintained.

## 2021-05-25 NOTE — ED Notes (Signed)
Pt transported to [facility] by safe transport

## 2021-05-25 NOTE — ED Provider Notes (Signed)
Behavioral Health Admission H&P [facility] & OBS)  Date: 05/25/21 Patient Name: Lisa Casey MRN: [id number] Chief Complaint: Panic attacks after heart attack      Diagnoses:  Final diagnoses:  Adjustment disorder with anxiety    HPI: Patient presented to [facility] with increasing anxiety. Patient admits to new medication changes since 05/06/2021. States she was on Paxil 20 mg QD and Hydroxyzine 25 mg QD and was changed to Amitriptyline 10 mg QD. Pt admits to feelings of worthlessness, depressed mood, tearfulness, and decreased sleep. Pt denies any suicidal or homicidal ideation. Patient was transferred to the [facility] for medication management.  Patient seen and examined face to face by this provider, chart reviewed and case discussed with Dr. Serafina Mitchell.  On arrival, patient is alert and oriented x4. Her mood is dysphoric and affect is congruent. She is hesitant about staying overnight because she has a son that she cares for that has Down syndrome. We discussed the patient staying overnight and speaking with the psychiatrist in the morning about medication changes for depression and anxiety. She is agreeable to staying overnight and speaking with the psychiatrist in the morning. She denies suicidal ideations. She denies homicidal ideations. She denies auditory and visual hallucinations. She does not appear to be responding to internal or external stimuli. She denies feeling depressed. She reports experiencing increased anxiety since her heart attack on July 28, 22. She reports having panic attacks every other day. She describes her anxiety attacks as feeling short of breath, waking up out of her sleep, sweating, nervousness, and worrying about having another heart attack. She reports a past hx of anxiety and depression but states her anxiety has strengthened since the heart attack.  PHQ 2-9:  Flowsheet Row Video Visit from  01/05/2021 in [facility]  Thoughts that you would be better off dead, or of hurting yourself in some way Not at all  PHQ-9 Total Score 0       Flowsheet Row ED from 05/25/2021 in [facility] ED to Hosp-Admission (Discharged) from 04/15/2021 in [place] No Risk No Risk        Total Time spent with patient: 30 minutes  Musculoskeletal  Strength & Muscle Tone: within normal limits Gait & Station: normal Patient leans: N/A  Psychiatric Specialty Exam  Presentation General Appearance: Appropriate for Environment  Eye Contact:Fair  Speech:Clear and Coherent  Speech Volume:Normal  Handedness:No data recorded  Mood and Affect  Mood:Dysphoric  Affect:Appropriate   Thought Process  Thought Processes:Coherent; Goal Directed  Descriptions of Associations:Intact  Orientation:Full (Time, Place and Person)  Thought Content:Logical    Hallucinations:Hallucinations: None  Ideas of Reference:None  Suicidal Thoughts:Suicidal Thoughts: No  Homicidal Thoughts:Homicidal Thoughts: No   Sensorium  Memory:Immediate Fair; Recent Fair; Remote Fair  Judgment:Fair  Insight:Fair   Executive Functions  Concentration:Fair  Attention Span:Fair  [place]   Psychomotor Activity  Psychomotor Activity:Psychomotor Activity: Normal   Assets  Assets:Desire for Improvement; Armed forces logistics/support/administrative officer; Financial Resources/Insurance; Housing; Intimacy; Leisure Time; Physical Health; Social Support   Sleep  Sleep:Sleep: Poor   Nutritional Assessment (For OBS and FBC admissions only) Has the patient had a weight loss or gain of 10 pounds or more in the last 3 months?: No Has the patient had a decrease in food intake/or appetite?: No Does the patient have dental problems?: No Does the patient have eating  habits or behaviors that may be  indicators of an eating disorder including binging or inducing vomiting?: No Has the patient recently lost weight without trying?: 0 Has the patient been eating poorly because of a decreased appetite?: 0 Malnutrition Screening Tool Score: 0   Physical Exam HENT:     Head: Normocephalic.     Nose: Nose normal.  Eyes:     Conjunctiva/sclera: Conjunctivae normal.  Cardiovascular:     Rate and Rhythm: Normal rate.  Pulmonary:     Effort: Pulmonary effort is normal.  Musculoskeletal:     Cervical back: Normal range of motion.  Neurological:     Mental Status: She is alert and oriented to person, place, and time.   Review of Systems  Constitutional: Negative.   HENT: Negative.    Eyes: Negative.   Respiratory: Negative.    Cardiovascular: Negative.   Gastrointestinal: Negative.   Genitourinary: Negative.   Musculoskeletal: Negative.   Skin: Negative.   Neurological: Negative.   Endo/Heme/Allergies: Negative.   Psychiatric/Behavioral:  The patient is nervous/anxious and has insomnia.    Last menstrual period 08/18/2011. There is no height or weight on file to calculate BMI.  Past Psychiatric History: hx of depression and anxiety   Is the patient at risk to self? No  Has the patient been a risk to self in the past 6 months? No .    Has the patient been a risk to self within the distant past? No   Is the patient a risk to others? No   Has the patient been a risk to others in the past 6 months? No   Has the patient been a risk to others within the distant past? No   Past Medical History:  Past Medical History:  Diagnosis Date   Anxiety    HX OF ANXIETY ATTACKS   Back pain, chronic    Cholelithiasis 04/15/2021   Diabetes ([place])    started Metformin in 02/2021   Fibromyalgia    GERD (gastroesophageal reflux disease)    Hypertension    Migraines    HX OF MIGRAINES   Obesity    Shortened PR interval     Past Surgical History:  Procedure Laterality Date   CONSTIPATION      CYSTOSCOPY W/ URETERAL STENT PLACEMENT  09/23/2012   Procedure: CYSTOSCOPY WITH RETROGRADE PYELOGRAM/URETERAL STENT PLACEMENT;  Surgeon: Alexis Frock, MD;  Location: [facility];  Service: Urology;  Laterality: Right;   CYSTOSCOPY WITH RETROGRADE PYELOGRAM, URETEROSCOPY AND STENT PLACEMENT  10/08/2012   Procedure: CYSTOSCOPY WITH RETROGRADE PYELOGRAM, URETEROSCOPY AND STENT PLACEMENT;  Surgeon: Alexis Frock, MD;  Location: [facility];  Service: Urology;  Laterality: Right;  90 MIN NO RETROGRADE NEEDS FLEX URETEROSCOPE    HOLMIUM LASER APPLICATION  5/70/1779   Procedure: HOLMIUM LASER APPLICATION;  Surgeon: Alexis Frock, MD;  Location: [facility];  Service: Urology;  Laterality: Right;   LEFT HEART CATH AND CORONARY ANGIOGRAPHY N/A 04/17/2021   Procedure: LEFT HEART CATH AND CORONARY ANGIOGRAPHY;  Surgeon: Burnell Blanks, MD;  Location: [facility];  Service: Cardiovascular;  Laterality: N/A;   LEFT HEART CATHETERIZATION WITH CORONARY ANGIOGRAM N/A 10/29/2011   Procedure: LEFT HEART CATHETERIZATION WITH CORONARY ANGIOGRAM;  Surgeon: Leonie Man, MD;  Location: [facility];  Service: Cardiovascular;  Laterality: N/A;   ROBOTIC ASSISTED LAP VAGINAL HYSTERECTOMY  09/14/2011   Procedure: ROBOTIC ASSISTED LAPAROSCOPIC VAGINAL HYSTERECTOMY;  Surgeon: Agnes Lawrence, MD;  Location: [facility];  Service: Gynecology;  Laterality: N/A;   TUBAL LIGATION      Family History:  Family History  Problem Relation Age of Onset   Breast cancer Mother 32   Breast cancer Paternal Grandmother    Heart failure Neg Hx    Coronary artery disease Neg Hx     Social History:  Social History   Socioeconomic History   Marital status: Divorced    Spouse name: Not on file   Number of children: Not on file   Years of education: Not on file   Highest education level: Not on file  Occupational History   Occupation: school bus driver  Tobacco Use   Smoking  status: Former    Packs/day: 0.25    Years: 10.00    Pack years: 2.50    Types: Cigarettes   Smokeless tobacco: Never  Vaping Use   Vaping Use: Never used  Substance and Sexual Activity   Alcohol use: No   Drug use: No   Sexual activity: Yes    Birth control/protection: Other-see comments    Comment: hysterectomy  Other Topics Concern   Not on file  Social History Narrative   Not on file   Social Determinants of Health   Financial Resource Strain: Not on file  Food Insecurity: Not on file  Transportation Needs: Not on file  Physical Activity: Not on file  Stress: Not on file  Social Connections: Not on file  Intimate Partner Violence: Not on file    SDOH:  SDOH Screenings   Alcohol Screen: Not on file  Depression (PHQ2-9): Low Risk    PHQ-2 Score: 0  Financial Resource Strain: Not on file  Food Insecurity: Not on file  Housing: Not on file  Physical Activity: Not on file  Social Connections: Not on file  Stress: Not on file  Tobacco Use: Medium Risk   Smoking Tobacco Use: Former   Smokeless Tobacco Use: Never  Transportation Needs: Not on file    Last Labs:  Admission on 05/25/2021, Discharged on 05/25/2021  Component Date Value Ref Range Status   WBC 05/25/2021 3.9 (A) 4.0 - 10.5 K/uL Final   RBC 05/25/2021 4.60  3.87 - 5.11 MIL/uL Final   Hemoglobin 05/25/2021 13.8  12.0 - 15.0 g/dL Final   HCT 05/25/2021 41.5  36.0 - 46.0 % Final   MCV 05/25/2021 90.2  80.0 - 100.0 fL Final   MCH 05/25/2021 30.0  26.0 - 34.0 pg Final   MCHC 05/25/2021 33.3  30.0 - 36.0 g/dL Final   RDW 05/25/2021 12.1  11.5 - 15.5 % Final   Platelets 05/25/2021 250  150 - 400 K/uL Final   nRBC 05/25/2021 0.0  0.0 - 0.2 % Final   Neutrophils Relative % 05/25/2021 42  % Final   Neutro Abs 05/25/2021 1.7  1.7 - 7.7 K/uL Final   Lymphocytes Relative 05/25/2021 49  % Final   Lymphs Abs 05/25/2021 1.9  0.7 - 4.0 K/uL Final   Monocytes Relative 05/25/2021 7  % Final   Monocytes Absolute  05/25/2021 0.3  0.1 - 1.0 K/uL Final   Eosinophils Relative 05/25/2021 1  % Final   Eosinophils Absolute 05/25/2021 0.0  0.0 - 0.5 K/uL Final   Basophils Relative 05/25/2021 1  % Final   Basophils Absolute 05/25/2021 0.0  0.0 - 0.1 K/uL Final   Immature Granulocytes 05/25/2021 0  % Final   Abs Immature Granulocytes 05/25/2021 0.00  0.00 - 0.07 K/uL Final   Performed at  [facility], [address] [place], [place], [place] [address]   Sodium 05/25/2021 141  135 - 145 mmol/L Final   Potassium 05/25/2021 3.8  3.5 - 5.1 mmol/L Final   Chloride 05/25/2021 104  98 - 111 mmol/L Final   CO2 05/25/2021 27  22 - 32 mmol/L Final   Glucose, Bld 05/25/2021 100 (A) 70 - 99 mg/dL Final   Glucose reference range applies only to samples taken after fasting for at least 8 hours.   BUN 05/25/2021 18  6 - 20 mg/dL Final   Creatinine, Ser 05/25/2021 0.72  0.44 - 1.00 mg/dL Final   Calcium 05/25/2021 9.9  8.9 - 10.3 mg/dL Final   GFR, Estimated 05/25/2021 >60  >60 mL/min Final   Comment: (NOTE) Calculated using the CKD-EPI Creatinine Equation (2021)    Anion gap 05/25/2021 10  5 - 15 Final   Performed at [facility], [address], [place], [place] [address]   Alcohol, Ethyl (B) 05/25/2021 <10  <10 mg/dL Final   Comment: (NOTE) Lowest detectable limit for serum alcohol is 10 mg/dL.  For medical purposes only. Performed at [facility], [place], [place] [address]    Acetaminophen (Tylenol), Serum 05/25/2021 <10 (A) 10 - 30 ug/mL Final   Comment: (NOTE) Therapeutic concentrations vary significantly. A range of 10-30 ug/mL  may be an effective concentration for many patients. However, some  are best treated at concentrations outside of this range. Acetaminophen concentrations >150 ug/mL at 4 hours after ingestion  and >50 ug/mL at 12 hours after ingestion are often associated with  toxic reactions.  Performed at [facility], [address], [place], [place] [address]    Salicylate Lvl 61/95/0932 <7.0 (A) 7.0 - 30.0 mg/dL Final   Performed at [facility], [address], [place], [place] [address]   SARS Coronavirus 2 by RT PCR 05/25/2021 NEGATIVE  NEGATIVE Final   Comment: (NOTE) SARS-CoV-2 target nucleic acids are NOT DETECTED.  The SARS-CoV-2 RNA is generally detectable in upper respiratory specimens during the acute phase of infection. The lowest concentration of SARS-CoV-2 viral copies this assay can detect is 138 copies/mL. A negative result does not preclude SARS-Cov-2 infection and should not be used as the sole basis for treatment or other patient management decisions. A negative result may occur with  improper specimen collection/handling, submission of specimen other than nasopharyngeal swab, presence of viral mutation(s) within the areas targeted by this assay, and inadequate number of viral copies(<138 copies/mL). A negative result must be combined with clinical observations, patient history, and epidemiological information. The expected result is Negative.  Fact Sheet for Patients:  EntrepreneurPulse.com.au  Fact Sheet for Healthcare Providers:  IncredibleEmployment.be  This test is no                          t yet approved or cleared by the [place] FDA and  has been authorized for detection and/or diagnosis of SARS-CoV-2 by FDA under an Emergency Use Authorization (EUA). This EUA will remain  in effect (meaning this test can be used) for the duration of the COVID-19 declaration under Section 564(b)(1) of the Act, 21 U.S.C.section 360bbb-3(b)(1), unless the authorization is terminated  or revoked sooner.       Influenza A by PCR 05/25/2021 NEGATIVE  NEGATIVE Final   Influenza B by PCR 05/25/2021 NEGATIVE  NEGATIVE Final   Comment: (NOTE) The Xpert Xpress SARS-CoV-2/FLU/RSV plus assay  is intended as an  aid in the diagnosis of influenza from Nasopharyngeal swab specimens and should not be used as a sole basis for treatment. Nasal washings and aspirates are unacceptable for Xpert Xpress SARS-CoV-2/FLU/RSV testing.  Fact Sheet for Patients: EntrepreneurPulse.com.au  Fact Sheet for Healthcare Providers: IncredibleEmployment.be  This test is not yet approved or cleared by the [place] FDA and has been authorized for detection and/or diagnosis of SARS-CoV-2 by FDA under an Emergency Use Authorization (EUA). This EUA will remain in effect (meaning this test can be used) for the duration of the COVID-19 declaration under Section 564(b)(1) of the Act, 21 U.S.C. section 360bbb-3(b)(1), unless the authorization is terminated or revoked.  Performed at [facility], [address], [place], [place] [address]   Office Visit on 04/20/2021  Component Date Value Ref Range Status   WBC 04/20/2021 5.2  3.4 - 10.8 x10E3/uL Final   RBC 04/20/2021 4.57  3.77 - 5.28 x10E6/uL Final   Hemoglobin 04/20/2021 13.6  11.1 - 15.9 g/dL Final   Hematocrit 04/20/2021 40.9  34.0 - 46.6 % Final   MCV 04/20/2021 90  79 - 97 fL Final   MCH 04/20/2021 29.8  26.6 - 33.0 pg Final   MCHC 04/20/2021 33.3  31.5 - 35.7 g/dL Final   RDW 04/20/2021 12.2  11.7 - 15.4 % Final   Platelets 04/20/2021 261  150 - 450 x10E3/uL Final   Glucose 04/20/2021 99  65 - 99 mg/dL Final   BUN 04/20/2021 19  8 - 27 mg/dL Final   Creatinine, Ser 04/20/2021 0.90  0.57 - 1.00 mg/dL Final   eGFR 04/20/2021 73  >59 mL/min/1.73 Final   BUN/Creatinine Ratio 04/20/2021 21  12 - 28 Final   Sodium 04/20/2021 144  134 - 144 mmol/L Final   Potassium 04/20/2021 4.8  3.5 - 5.2 mmol/L Final   Chloride 04/20/2021 107 (A) 96 - 106 mmol/L Final   CO2 04/20/2021 23  20 - 29 mmol/L Final   Calcium 04/20/2021 9.8  8.7 - 10.3 mg/dL Final   Total Protein 04/20/2021 7.0  6.0 - 8.5 g/dL Final    Albumin 04/20/2021 4.5  3.8 - 4.9 g/dL Final   Globulin, Total 04/20/2021 2.5  1.5 - 4.5 g/dL Final   Albumin/Globulin Ratio 04/20/2021 1.8  1.2 - 2.2 Final   Bilirubin Total 04/20/2021 0.5  0.0 - 1.2 mg/dL Final   Alkaline Phosphatase 04/20/2021 82  44 - 121 IU/L Final   AST 04/20/2021 15  0 - 40 IU/L Final   ALT 04/20/2021 14  0 - 32 IU/L Final  Admission on 04/15/2021, Discharged on 04/18/2021  Component Date Value Ref Range Status   Sodium 04/15/2021 137  135 - 145 mmol/L Final   Potassium 04/15/2021 3.6  3.5 - 5.1 mmol/L Final   Chloride 04/15/2021 106  98 - 111 mmol/L Final   CO2 04/15/2021 24  22 - 32 mmol/L Final   Glucose, Bld 04/15/2021 156 (A) 70 - 99 mg/dL Final   Glucose reference range applies only to samples taken after fasting for at least 8 hours.   BUN 04/15/2021 18  6 - 20 mg/dL Final   Creatinine, Ser 04/15/2021 0.93  0.44 - 1.00 mg/dL Final   Calcium 04/15/2021 8.7 (A) 8.9 - 10.3 mg/dL Final   GFR, Estimated 04/15/2021 >60  >60 mL/min Final   Comment: (NOTE) Calculated using the CKD-EPI Creatinine Equation (2021)    Anion gap 04/15/2021 7  5 - 15 Final  Performed at [facility], [place] [address], [place], [place] [address]   WBC 04/15/2021 7.4  4.0 - 10.5 K/uL Final   RBC 04/15/2021 4.20  3.87 - 5.11 MIL/uL Final   Hemoglobin 04/15/2021 12.8  12.0 - 15.0 g/dL Final   HCT 04/15/2021 39.4  36.0 - 46.0 % Final   MCV 04/15/2021 93.8  80.0 - 100.0 fL Final   MCH 04/15/2021 30.5  26.0 - 34.0 pg Final   MCHC 04/15/2021 32.5  30.0 - 36.0 g/dL Final   RDW 04/15/2021 11.7  11.5 - 15.5 % Final   Platelets 04/15/2021 258  150 - 400 K/uL Final   nRBC 04/15/2021 0.0  0.0 - 0.2 % Final   Performed at [facility] [address] [place], [place] [address]   Troponin I (High Sensitivity) 04/15/2021 4  <18 ng/L Final   Comment: (NOTE) Elevated high sensitivity troponin I (hsTnI) values and significant  changes across serial measurements may suggest ACS but many  other  chronic and acute conditions are known to elevate hsTnI results.  Refer to the "Links" section for chest pain algorithms and additional  guidance. Performed at [facility], [place] [address] [place], [place] [address]    I-stat hCG, quantitative 04/15/2021 <5.0  <5 mIU/mL Final   Comment 3 04/15/2021          Final   Comment:   GEST. AGE      CONC.  (mIU/mL)   <=1 WEEK        5 - 50     2 WEEKS       50 - 500     3 WEEKS       100 - 10,000     4 WEEKS     1,000 - 30,000        FEMALE AND NON-PREGNANT FEMALE:     LESS THAN 5 mIU/mL    D-Dimer, Quant 04/15/2021 0.78 (A) 0.00 - 0.50 ug/mL-FEU Final   Comment: (NOTE) At the manufacturer cut-off value of 0.5 g/mL FEU, this assay has a negative predictive value of 95-100%.This assay is intended for use in conjunction with a clinical pretest probability (PTP) assessment model to exclude pulmonary embolism (PE) and deep venous thrombosis (DVT) in outpatients suspected of PE or DVT. Results should be correlated with clinical presentation. Performed at [facility], [place] [address] [place], [place] [address]    Troponin I (High Sensitivity) 04/15/2021 632 (A) <18 ng/L Final   Comment: PHD T. SHROPSHIRE RN BY SSTEPHENS [phone] CRITICAL RESULT CALLED TO, READ BACK BY AND VERIFIED WITH: (NOTE) Elevated high sensitivity troponin I (hsTnI) values and significant  changes across serial measurements may suggest ACS but many other  chronic and acute conditions are known to elevate hsTnI results.  Refer to the Links section for chest pain algorithms and additional  guidance. Performed at [facility], [place] [address] [place], [place] [address]    SARS Coronavirus 2 by RT PCR 04/15/2021 NEGATIVE  NEGATIVE Final   Comment: (NOTE) SARS-CoV-2 target nucleic acids are NOT DETECTED.  The SARS-CoV-2 RNA is generally detectable in upper respiratory specimens during the acute phase of infection. The  lowest concentration of SARS-CoV-2 viral copies this assay can detect is 138 copies/mL. A negative result does not preclude SARS-Cov-2 infection and should not be used as the sole basis for treatment or other patient management decisions. A negative result may occur with  improper  specimen collection/handling, submission of specimen other than nasopharyngeal swab, presence of viral mutation(s) within the areas targeted by this assay, and inadequate number of viral copies(<138 copies/mL). A negative result must be combined with clinical observations, patient history, and epidemiological information. The expected result is Negative.  Fact Sheet for Patients:  EntrepreneurPulse.com.au  Fact Sheet for Healthcare Providers:  IncredibleEmployment.be  This test is no                          t yet approved or cleared by the [place] FDA and  has been authorized for detection and/or diagnosis of SARS-CoV-2 by FDA under an Emergency Use Authorization (EUA). This EUA will remain  in effect (meaning this test can be used) for the duration of the COVID-19 declaration under Section 564(b)(1) of the Act, 21 U.S.C.section 360bbb-3(b)(1), unless the authorization is terminated  or revoked sooner.       Influenza A by PCR 04/15/2021 NEGATIVE  NEGATIVE Final   Influenza B by PCR 04/15/2021 NEGATIVE  NEGATIVE Final   Comment: (NOTE) The Xpert Xpress SARS-CoV-2/FLU/RSV plus assay is intended as an aid in the diagnosis of influenza from Nasopharyngeal swab specimens and should not be used as a sole basis for treatment. Nasal washings and aspirates are unacceptable for Xpert Xpress SARS-CoV-2/FLU/RSV testing.  Fact Sheet for Patients: EntrepreneurPulse.com.au  Fact Sheet for Healthcare Providers: IncredibleEmployment.be  This test is not yet approved or cleared by the [place] FDA and has been authorized for  detection and/or diagnosis of SARS-CoV-2 by FDA under an Emergency Use Authorization (EUA). This EUA will remain in effect (meaning this test can be used) for the duration of the COVID-19 declaration under Section 564(b)(1) of the Act, 21 U.S.C. section 360bbb-3(b)(1), unless the authorization is terminated or revoked.  Performed at [facility], [place] [address] [place], [place] [address]    Heparin Unfractionated 04/15/2021 0.27 (A) 0.30 - 0.70 IU/mL Final   Comment: (NOTE) The clinical reportable range upper limit is being lowered to >1.10 to align with the FDA approved guidance for the current laboratory assay.  If heparin results are below expected values, and patient dosage has  been confirmed, suggest follow up testing of antithrombin III levels. Performed at [facility], [place] [address], [place], [place] [address]    Prothrombin Time 04/15/2021 12.1  11.4 - 15.2 seconds Final   INR 04/15/2021 0.9  0.8 - 1.2 Final   Comment: (NOTE) INR goal varies based on device and disease states. Performed at [facility], [place] [address], [place], [place] [address]    HIV Screen 4th Generation wRfx 04/15/2021 Non Reactive  Non Reactive Final   Performed at [facility], [place] [address] [place], [place] [address]   Troponin I (High Sensitivity) 04/15/2021 2,145 (A) <18 ng/L Final   Comment: CRITICAL VALUE NOTED.  VALUE IS CONSISTENT WITH PREVIOUSLY REPORTED AND CALLED VALUE. (NOTE) Elevated high sensitivity troponin I (hsTnI) values and significant  changes across serial measurements may suggest ACS but many other  chronic and acute conditions are known to elevate hsTnI results.  Refer to the Links section for chest pain algorithms and additional  guidance. Performed at [facility], [place] [address], [place], [place] [address]    Weight 04/15/2021 3,392  oz Final   Height 04/15/2021 65  in Final   BP 04/15/2021 141/82  mmHg Final   S' Lateral 04/15/2021  2.10  cm Final   Area-P 1/2 04/15/2021 3.85  cm2 Final   Magnesium 04/15/2021 2.0  1.7 - 2.4 mg/dL Final   Performed at [facility] [address], [place], [place] [address]   Troponin I (High Sensitivity) 04/15/2021 1,865 (A) <18 ng/L Final   Comment: CRITICAL VALUE NOTED.  VALUE IS CONSISTENT WITH PREVIOUSLY REPORTED AND CALLED VALUE. (NOTE) Elevated high sensitivity troponin I (hsTnI) values and significant  changes across serial measurements may suggest ACS but many other  chronic and acute conditions are known to elevate hsTnI results.  Refer to the Links section for chest pain algorithms and additional  guidance. Performed at [facility], [place] [address], [place], [place] [address]    Glucose-Capillary 04/15/2021 107 (A) 70 - 99 mg/dL Final   Glucose reference range applies only to samples taken after fasting for at least 8 hours.   Comment 1 04/15/2021 Notify RN   Final   Glucose-Capillary 04/15/2021 104 (A) 70 - 99 mg/dL Final   Glucose reference range applies only to samples taken after fasting for at least 8 hours.   Comment 1 04/15/2021 Notify RN   Final   Comment 2 04/15/2021 Document in Chart   Final   Sodium 04/16/2021 138  135 - 145 mmol/L Final   Potassium 04/16/2021 4.1  3.5 - 5.1 mmol/L Final   Chloride 04/16/2021 105  98 - 111 mmol/L Final   CO2 04/16/2021 25  22 - 32 mmol/L Final   Glucose, Bld 04/16/2021 115 (A) 70 - 99 mg/dL Final   Glucose reference range applies only to samples taken after fasting for at least 8 hours.   BUN 04/16/2021 10  6 - 20 mg/dL Final   Creatinine, Ser 04/16/2021 0.72  0.44 - 1.00 mg/dL Final   Calcium 04/16/2021 9.2  8.9 - 10.3 mg/dL Final   GFR, Estimated 04/16/2021 >60  >60 mL/min Final   Comment: (NOTE) Calculated using the CKD-EPI Creatinine Equation (2021)    Anion gap 04/16/2021 8  5 - 15 Final   Performed at [facility], [place] [address] [place], [place] [address]   Cholesterol 04/16/2021 144  0 - 200  mg/dL Final   Triglycerides 04/16/2021 81  <150 mg/dL Final   HDL 04/16/2021 61  >40 mg/dL Final   Total CHOL/HDL Ratio 04/16/2021 2.4  RATIO Final   VLDL 04/16/2021 16  0 - 40 mg/dL Final   LDL Cholesterol 04/16/2021 67  0 - 99 mg/dL Final   Comment:        Total Cholesterol/HDL:CHD Risk Coronary Heart Disease Risk Table                     Men   Women  1/2 Average Risk   3.4   3.3  Average Risk       5.0   4.4  2 X Average Risk   9.6   7.1  3 X Average Risk  23.4   11.0        Use the calculated Patient Ratio above and the CHD Risk Table to determine the patient's CHD Risk.        ATP III CLASSIFICATION (LDL):  <100     mg/dL   Optimal  100-129  mg/dL   Near or Above                    Optimal  130-159  mg/dL   Borderline  160-189  mg/dL   High  >190     mg/dL   Very High Performed at [facility] [address] [place], [place] [address]    WBC 04/16/2021 5.6  4.0 - 10.5 K/uL Final   RBC 04/16/2021 4.25  3.87 - 5.11 MIL/uL Final   Hemoglobin 04/16/2021 12.7  12.0 - 15.0 g/dL Final   HCT 04/16/2021 39.3  36.0 - 46.0 % Final   MCV 04/16/2021 92.5  80.0 - 100.0 fL Final   MCH 04/16/2021 29.9  26.0 - 34.0 pg Final   MCHC 04/16/2021 32.3  30.0 - 36.0 g/dL Final   RDW 04/16/2021 12.0  11.5 - 15.5 % Final   Platelets 04/16/2021 270  150 - 400 K/uL Final   nRBC 04/16/2021 0.0  0.0 - 0.2 % Final   Performed at [facility] [address] [place], [place] [address]   Prothrombin Time 04/16/2021 13.5  11.4 - 15.2 seconds Final   INR 04/16/2021 1.0  0.8 - 1.2 Final   Comment: (NOTE) INR goal varies based on device and disease states. Performed at [facility], [place] [address] [place], [place] [address]    Troponin I (High Sensitivity) 04/15/2021 931 (A) <18 ng/L Final   Comment: CRITICAL VALUE NOTED.  VALUE IS CONSISTENT WITH PREVIOUSLY REPORTED AND CALLED VALUE. (NOTE) Elevated high sensitivity troponin I (hsTnI) values and significant  changes across  serial measurements may suggest ACS but many other  chronic and acute conditions are known to elevate hsTnI results.  Refer to the Links section for chest pain algorithms and additional  guidance. Performed at [facility], [place] [address], [place], [place] [address]    Glucose-Capillary 04/15/2021 122 (A) 70 - 99 mg/dL Final   Glucose reference range applies only to samples taken after fasting for at least 8 hours.   Troponin I (High Sensitivity) 04/15/2021 689 (A) <18 ng/L Final   Comment: CRITICAL VALUE NOTED.  VALUE IS CONSISTENT WITH PREVIOUSLY REPORTED AND CALLED VALUE. (NOTE) Elevated high sensitivity troponin I (hsTnI) values and significant  changes across serial measurements may suggest ACS but many other  chronic and acute conditions are known to elevate hsTnI results.  Refer to the Links section for chest pain algorithms and additional  guidance. Performed at [facility], [place] [address] [place], [place] [address]    Heparin Unfractionated 04/16/2021 0.44  0.30 - 0.70 IU/mL Final   Comment: (NOTE) The clinical reportable range upper limit is being lowered to >1.10 to align with the FDA approved guidance for the current laboratory assay.  If heparin results are below expected values, and patient dosage has  been confirmed, suggest follow up testing of antithrombin III levels. Performed at [facility], [place] [address], [place], [place] [address]    Glucose-Capillary 04/16/2021 104 (A) 70 - 99 mg/dL Final   Glucose reference range applies only to samples taken after fasting for at least 8 hours.   Glucose-Capillary 04/16/2021 121 (A) 70 - 99 mg/dL Final   Glucose reference range applies only to samples taken after fasting for at least 8 hours.   Glucose-Capillary 04/16/2021 103 (A) 70 - 99 mg/dL Final   Glucose reference range applies only to samples taken after fasting for at least 8 hours.   Heparin Unfractionated 04/17/2021 0.39  0.30 - 0.70 IU/mL  Final   Comment: (NOTE) The clinical reportable range upper limit is being lowered to >1.10 to align with the  FDA approved guidance for the current laboratory assay.  If heparin results are below expected values, and patient dosage has  been confirmed, suggest follow up testing of antithrombin III levels. Performed at [facility], [place] [address] [place], [place] [address]    Glucose-Capillary 04/16/2021 106 (A) 70 - 99 mg/dL Final   Glucose reference range applies only to samples taken after fasting for at least 8 hours.   Glucose-Capillary 04/16/2021 113 (A) 70 - 99 mg/dL Final   Glucose reference range applies only to samples taken after fasting for at least 8 hours.   Glucose-Capillary 04/17/2021 125 (A) 70 - 99 mg/dL Final   Glucose reference range applies only to samples taken after fasting for at least 8 hours.   Glucose-Capillary 04/17/2021 113 (A) 70 - 99 mg/dL Final   Glucose reference range applies only to samples taken after fasting for at least 8 hours.   Glucose-Capillary 04/17/2021 104 (A) 70 - 99 mg/dL Final   Glucose reference range applies only to samples taken after fasting for at least 8 hours.   Glucose-Capillary 04/17/2021 96  70 - 99 mg/dL Final   Glucose reference range applies only to samples taken after fasting for at least 8 hours.   Sodium 04/18/2021 136  135 - 145 mmol/L Final   Potassium 04/18/2021 3.8  3.5 - 5.1 mmol/L Final   Chloride 04/18/2021 103  98 - 111 mmol/L Final   CO2 04/18/2021 23  22 - 32 mmol/L Final   Glucose, Bld 04/18/2021 112 (A) 70 - 99 mg/dL Final   Glucose reference range applies only to samples taken after fasting for at least 8 hours.   BUN 04/18/2021 17  6 - 20 mg/dL Final   Creatinine, Ser 04/18/2021 0.91  0.44 - 1.00 mg/dL Final   Calcium 04/18/2021 9.5  8.9 - 10.3 mg/dL Final   GFR, Estimated 04/18/2021 >60  >60 mL/min Final   Comment: (NOTE) Calculated using the CKD-EPI Creatinine Equation (2021)    Anion gap  04/18/2021 10  5 - 15 Final   Performed at [facility], [place] [address], [place], [place] [address]   WBC 04/18/2021 5.9  4.0 - 10.5 K/uL Final   RBC 04/18/2021 4.47  3.87 - 5.11 MIL/uL Final   Hemoglobin 04/18/2021 13.7  12.0 - 15.0 g/dL Final   HCT 04/18/2021 40.2  36.0 - 46.0 % Final   MCV 04/18/2021 89.9  80.0 - 100.0 fL Final   MCH 04/18/2021 30.6  26.0 - 34.0 pg Final   MCHC 04/18/2021 34.1  30.0 - 36.0 g/dL Final   RDW 04/18/2021 11.6  11.5 - 15.5 % Final   Platelets 04/18/2021 271  150 - 400 K/uL Final   nRBC 04/18/2021 0.0  0.0 - 0.2 % Final   Performed at [facility] [address], [place], [place] [address]   Glucose-Capillary 04/17/2021 111 (A) 70 - 99 mg/dL Final   Glucose reference range applies only to samples taken after fasting for at least 8 hours.   Glucose-Capillary 04/18/2021 114 (A) 70 - 99 mg/dL Final   Glucose reference range applies only to samples taken after fasting for at least 8 hours.   Glucose-Capillary 04/18/2021 111 (A) 70 - 99 mg/dL Final   Glucose reference range applies only to samples taken after fasting for at least 8 hours.   TSH 04/18/2021 2.237  0.350 - 4.500 uIU/mL Final   Comment: Performed by a 3rd Generation assay with a  functional sensitivity of <=0.01 uIU/mL. Performed at [facility], [place] [address], [place], [place] [address]    Glucose-Capillary 04/18/2021 89  70 - 99 mg/dL Final   Glucose reference range applies only to samples taken after fasting for at least 8 hours.  Office Visit on 03/09/2021  Component Date Value Ref Range Status   Hgb A1c MFr Bld 04/13/2021 6.4 (A) 4.8 - 5.6 % Final   Comment:          Prediabetes: 5.7 - 6.4          Diabetes: >6.4          Glycemic control for adults with diabetes: <7.0    Est. average glucose Bld gHb Est-m* 04/13/2021 137  mg/dL Final   Glucose 04/13/2021 106 (A) 65 - 99 mg/dL Final   BUN 04/13/2021 14  8 - 27 mg/dL Final   Creatinine, Ser 04/13/2021 0.78  0.57 - 1.00  mg/dL Final   eGFR 04/13/2021 87  >59 mL/min/1.73 Final   BUN/Creatinine Ratio 04/13/2021 18  12 - 28 Final   Sodium 04/13/2021 141  134 - 144 mmol/L Final   Potassium 04/13/2021 4.3  3.5 - 5.2 mmol/L Final   Chloride 04/13/2021 104  96 - 106 mmol/L Final   CO2 04/13/2021 23  20 - 29 mmol/L Final   Calcium 04/13/2021 9.4  8.7 - 10.3 mg/dL Final   Total Protein 04/13/2021 6.9  6.0 - 8.5 g/dL Final   Albumin 04/13/2021 4.3  3.8 - 4.9 g/dL Final   Globulin, Total 04/13/2021 2.6  1.5 - 4.5 g/dL Final   Albumin/Globulin Ratio 04/13/2021 1.7  1.2 - 2.2 Final   Bilirubin Total 04/13/2021 0.7  0.0 - 1.2 mg/dL Final   Alkaline Phosphatase 04/13/2021 84  44 - 121 IU/L Final   AST 04/13/2021 17  0 - 40 IU/L Final   ALT 04/13/2021 15  0 - 32 IU/L Final  Office Visit on 01/02/2021  Component Date Value Ref Range Status   dsDNA Ab 01/02/2021 <1  0 - 9 IU/mL Final   Comment:                                    Negative      <5                                    Equivocal  5 - 9                                    Positive      >9    Complement C3, Serum 01/02/2021 161  82 - 167 mg/dL Final   Anti Nuclear Antibody (ANA) 01/02/2021 Negative  Negative Final   Vitamin B-12 01/02/2021 768  232 - 1,245 pg/mL Final   Vit D, 25-Hydroxy 01/02/2021 30.3  30.0 - 100.0 ng/mL Final   Comment: Vitamin D deficiency has been defined by the Institute of Medicine and an Endocrine Society practice guideline as a level of serum 25-OH vitamin D less than 20 ng/mL (1,2). The Endocrine Society went on to further define vitamin D insufficiency as a level between 21 and 29 ng/mL (2). 1. IOM (Institute of Medicine). 2010. Dietary reference    intakes for calcium  and D. [place]: The    [employer]. 2. Holick MF, Binkley [place], Bischoff-Ferrari HA, et al.    Evaluation, treatment, and prevention of vitamin D    deficiency: an Endocrine Society clinical practice    guideline. JCEM. 2011 Jul; 96(7):1911-30.      Allergies: Codeine  PTA Medications: (Not in a hospital admission)   Medical Decision Making  Patient [facility] for medication adjustments.   Home medications verified and restarted:  amitriptyline  10 mg Oral QHS   [START ON 05/26/2021] amLODipine  5 mg Oral Daily   [START ON 05/26/2021] aspirin EC  81 mg Oral Daily   [START ON 05/26/2021] clopidogrel  75 mg Oral Daily   [START ON 05/26/2021] loratadine  10 mg Oral QPM   [START ON 05/26/2021] metFORMIN  500 mg Oral BID WC   metoprolol tartrate  12.5 mg Oral BID   Labs reviewed.  Recommendations  Based on my evaluation the patient does not appear to have an emergency medical condition.  Marissa Calamity, NP 05/25/21  6:45 PM

## 2021-05-25 NOTE — Consult Note (Signed)
[facility] Discharge Suicide Risk Assessment   Principal Problem: <principal problem not specified> Discharge Diagnoses: Active Problems:   Adjustment disorder with anxiety   Total Time spent with patient: 30 minutes  Musculoskeletal: Strength & Muscle Tone: within normal limits Gait & Station: normal Patient leans: N/A  Psychiatric Specialty Exam  Presentation  General Appearance: Appropriate for Environment  Eye Contact:Fair  Speech:Clear and Coherent  Speech Volume:Normal  Handedness: No data recorded  Mood and Affect  Mood:Dysphoric  Duration of Depression Symptoms: Greater than two weeks  Affect:Appropriate   Thought Process  Thought Processes:Coherent; Goal Directed  Descriptions of Associations:Intact  Orientation:Full (Time, Place and Person)  Thought Content:Logical  History of Schizophrenia/Schizoaffective disorder:No data recorded Duration of Psychotic Symptoms:No data recorded Hallucinations:Hallucinations: None  Ideas of Reference:None  Suicidal Thoughts:Suicidal Thoughts: No  Homicidal Thoughts:Homicidal Thoughts: No   Sensorium  Memory:Immediate Fair; Recent Fair; Remote Fair  Judgment:Fair  Insight:Fair   Executive Functions  Concentration:Fair  Attention Span:Fair  [place]   Psychomotor Activity  Psychomotor Activity:Psychomotor Activity: Normal   Assets  Assets:Desire for Improvement; Armed forces logistics/support/administrative officer; Financial Resources/Insurance; Housing; Intimacy; Leisure Time; Physical Health; Social Support   Sleep  Sleep:Sleep: Poor   Physical Exam: Physical Exam HENT:     Head: Normocephalic.     Nose: Nose normal.  Eyes:     Conjunctiva/sclera: Conjunctivae normal.  Cardiovascular:     Rate and Rhythm: Normal rate.  Pulmonary:     Effort: Pulmonary effort is normal.  Musculoskeletal:        General: Normal range of motion.     Cervical back: Normal range of motion.   Neurological:     Mental Status: She is alert and oriented to person, place, and time.   Review of Systems  Constitutional: Negative.   HENT: Negative.    Eyes: Negative.   Cardiovascular: Negative.   Gastrointestinal: Negative.   Genitourinary: Negative.   Musculoskeletal: Negative.   Neurological: Negative.   Endo/Heme/Allergies: Negative.   Psychiatric/Behavioral:  The patient is nervous/anxious.   Last menstrual period 08/18/2011. There is no height or weight on file to calculate BMI.  Mental Status Per Nursing Assessment::   On Admission:     Demographic Factors:  NA  Loss Factors: Decline in physical health  Historical Factors: NA  Risk Reduction Factors:   Sense of responsibility to family, Religious beliefs about death, Living with another person, especially a relative, and Positive social support  Continued Clinical Symptoms:  Panic Attacks  Cognitive Features That Contribute To Risk:  None    Suicide Risk:  Minimal: No identifiable suicidal ideation.  Patients presenting with no risk factors but with morbid ruminations; may be classified as minimal risk based on the severity of the depressive symptoms  Plan Of Care/Follow-up recommendations:  Activity:  as tolerated    Follow-up Information     [facility] on 06/03/2021.   Why: At: 1:20 pm Contact information: [place] [address] [phone]                 Marissa Calamity, NP 05/25/2021, 7:33 PM

## 2021-05-25 NOTE — ED Notes (Signed)
I have called [facility] [phone] several times to followup why we have not heard from them and there is no answer

## 2021-05-25 NOTE — Discharge Instructions (Addendum)
For your behavioral health needs, you are advised to follow up with an outpatient psychiatrist.  You are scheduled for an intake appointment with Leanord Hawking, MD, on Saturday, June 03, 2021 at 1:20 pm.  This appointment will be in-person:       Leanord Hawking, MD      [facility]      [address] [place], [place] [address]      [phone]  You would also benefit from seeing an outpatient therapist.  Contact one of the following providers at your earliest opportunity to schedule an intake appointment:       [facility]. [employer] [place], [place] [address]      [phone]       [facility]      [id number]      [place], [place] [address]      [phone]       [facility]      [address], [place]      [place], [place] [address]      [phone]

## 2021-05-25 NOTE — Discharge Instructions (Signed)
For your behavioral health needs, you are advised to follow up with an outpatient psychiatrist.   You are scheduled for an intake appointment with Leanord Hawking, MD, on Saturday, June 03, 2021 at 1:20 pm. This appointment will be in-person: Leanord Hawking, MD [facility] [address] [place], [place] [address] [phone]  You would also benefit from seeing an outpatient therapist. Contact one of the following providers at your earliest opportunity to schedule an intake appointment:  Take all of you medications as prescribed by your mental healthcare provider.  Report any adverse effects and reactions from your medications to your outpatient provider promptly.  Do not engage in alcohol and or illegal drug use while on prescription medicines.  Keep all scheduled appointments. This is to ensure that you are getting refills on time and to avoid any interruption in your medication.  If you are unable to keep an appointment call to reschedule.  Be sure to follow up with resources and follow ups given.  In the event of worsening symptoms call the crisis hotline, 911, and or go to the nearest emergency department for appropriate evaluation and treatment of symptoms.  Follow-up with your primary care provider for your medical issues, concerns and or health care needs.
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
## 2021-05-25 NOTE — ED Notes (Signed)
Pt d/c'd via safe transport with voluntary commitment paperwork and EMTALA form.

## 2021-05-25 NOTE — ED Notes (Signed)
Report given to accepting nurse at [facility].  Patient will be transported via [facility].

## 2021-05-25 NOTE — ED Provider Notes (Signed)
FBC/OBS ASAP Discharge Summary  Date and Time: 05/25/2021 7:22 PM  Name: Lisa Casey  MRN:  [id number]   Discharge Diagnoses:  Final diagnoses:  Adjustment disorder with anxiety    Subjective: "I want to leave. I thought this was outpatient treatment. I can't stay here for days. I have to care for my son who has down syndrome."   Stay Summary: After admitting the patient to the unit, she returned 5 minutes later requesting to discharged. She states that she would like to follow up with outpatient psychiatry for medication management.   Admission H&P note-HPI: Patient presented to [facility] with increasing anxiety. Patient admits to new medication changes since 05/06/2021. States she was on Paxil 20 mg QD and Hydroxyzine 25 mg QD and was changed to Amitriptyline 10 mg QD. Pt admits to feelings of worthlessness, depressed mood, tearfulness, and decreased sleep. Pt denies any suicidal or homicidal ideation. Patient was transferred to the [place] for medication management.   Patient seen and examined face to face by this provider, chart reviewed and case discussed with Dr. Serafina Mitchell.  On arrival, patient is alert and oriented x4. Her mood is dysphoric and affect is congruent. She is hesitant about staying overnight because she has a son that she cares for that has Down syndrome. We discussed the patient staying overnight and speaking with the psychiatrist in the morning about medication changes for depression and anxiety. She is agreeable to staying overnight and speaking with the psychiatrist in the morning. She denies suicidal ideations. She denies homicidal ideations. She denies auditory and visual hallucinations. She does not appear to be responding to internal or external stimuli. She denies feeling depressed. She reports experiencing increased anxiety since her heart attack on July 28, 22. She reports having panic  attacks every other day. She describes her anxiety attacks as feeling short of breath, waking up out of her sleep, sweating, nervousness, and worrying about having another heart attack. She reports a past hx of anxiety and depression but states her anxiety has strengthened since the heart attack.  Total Time spent with patient: 30 minutes  Past Psychiatric History: Hx of MDD and anxiety  Past Medical History:  Past Medical History:  Diagnosis Date   Anxiety    HX OF ANXIETY ATTACKS   Back pain, chronic    Cholelithiasis 04/15/2021   Diabetes ([place])    started Metformin in 02/2021   Fibromyalgia    GERD (gastroesophageal reflux disease)    Hypertension    Migraines    HX OF MIGRAINES   Obesity    Shortened PR interval     Past Surgical History:  Procedure Laterality Date   CONSTIPATION     CYSTOSCOPY W/ URETERAL STENT PLACEMENT  09/23/2012   Procedure: CYSTOSCOPY WITH RETROGRADE PYELOGRAM/URETERAL STENT PLACEMENT;  Surgeon: Alexis Frock, MD;  Location: [facility];  Service: Urology;  Laterality: Right;   CYSTOSCOPY WITH RETROGRADE PYELOGRAM, URETEROSCOPY AND STENT PLACEMENT  10/08/2012   Procedure: CYSTOSCOPY WITH RETROGRADE PYELOGRAM, URETEROSCOPY AND STENT PLACEMENT;  Surgeon: Alexis Frock, MD;  Location: [facility];  Service: Urology;  Laterality: Right;  90 MIN NO RETROGRADE NEEDS FLEX URETEROSCOPE    HOLMIUM LASER APPLICATION  5/80/9983   Procedure: HOLMIUM LASER APPLICATION;  Surgeon: Alexis Frock, MD;  Location[facility];  Service: Urology;  Laterality: Right;   LEFT HEART CATH AND CORONARY ANGIOGRAPHY N/A 04/17/2021   Procedure: LEFT HEART CATH AND CORONARY  ANGIOGRAPHY;  Surgeon: Burnell Blanks, MD;  Location: [facility];  Service: Cardiovascular;  Laterality: N/A;   LEFT HEART CATHETERIZATION WITH CORONARY ANGIOGRAM N/A 10/29/2011   Procedure: LEFT HEART CATHETERIZATION WITH CORONARY ANGIOGRAM;  Surgeon: Leonie Man, MD;   Location[facility];  Service: Cardiovascular;  Laterality: N/A;   ROBOTIC ASSISTED LAP VAGINAL HYSTERECTOMY  09/14/2011   Procedure: ROBOTIC ASSISTED LAPAROSCOPIC VAGINAL HYSTERECTOMY;  Surgeon: Agnes Lawrence, MD;  Location: [facility];  Service: Gynecology;  Laterality: N/A;   TUBAL LIGATION     Family History:  Family History  Problem Relation Age of Onset   Breast cancer Mother 1   Breast cancer Paternal Grandmother    Heart failure Neg Hx    Coronary artery disease Neg Hx    Family Psychiatric History: Patient denies a family hx of mental illness. Report son has down syndrome  Social History:  Social History   Substance and Sexual Activity  Alcohol Use No     Social History   Substance and Sexual Activity  Drug Use No    Social History   Socioeconomic History   Marital status: Divorced    Spouse name: Not on file   Number of children: Not on file   Years of education: Not on file   Highest education level: Not on file  Occupational History   Occupation: school bus driver  Tobacco Use   Smoking status: Former    Packs/day: 0.25    Years: 10.00    Pack years: 2.50    Types: Cigarettes   Smokeless tobacco: Never  Vaping Use   Vaping Use: Never used  Substance and Sexual Activity   Alcohol use: No   Drug use: No   Sexual activity: Yes    Birth control/protection: Other-see comments    Comment: hysterectomy  Other Topics Concern   Not on file  Social History Narrative   Not on file   Social Determinants of Health   Financial Resource Strain: Not on file  Food Insecurity: Not on file  Transportation Needs: Not on file  Physical Activity: Not on file  Stress: Not on file  Social Connections: Not on file   SDOH:  SDOH Screenings   Alcohol Screen: Not on file  Depression (PHQ2-9): Low Risk    PHQ-2 Score: 0  Financial Resource Strain: Not on file  Food Insecurity: Not on file  Housing: Not on file  Physical Activity: Not on file  Social  Connections: Not on file  Stress: Not on file  Tobacco Use: Medium Risk   Smoking Tobacco Use: Former   Smokeless Tobacco Use: Never  Transportation Needs: Not on file    Tobacco Cessation:  N/A, patient does not currently use tobacco products  Current Medications:  Current Facility-Administered Medications  Medication Dose Route Frequency Provider Last Rate Last Admin   amitriptyline (ELAVIL) tablet 10 mg  10 mg Oral QHS Ople Girgis L, NP       [START ON 05/26/2021] amLODipine (NORVASC) tablet 5 mg  5 mg Oral Daily Yulisa Chirico L, NP       [START ON 05/26/2021] aspirin EC tablet 81 mg  81 mg Oral Daily Hanifa Antonetti L, NP       [START ON 05/26/2021] clopidogrel (PLAVIX) tablet 75 mg  75 mg Oral Daily Costas Sena L, NP       [START ON 05/26/2021] loratadine (CLARITIN) tablet 10 mg  10 mg Oral QPM Suzzanne Cloud, [place]       [  START ON 05/26/2021] metFORMIN (GLUCOPHAGE) tablet 500 mg  500 mg Oral BID WC Jovi Zavadil L, NP       metoprolol tartrate (LOPRESSOR) tablet 12.5 mg  12.5 mg Oral BID Sherrika Weakland L, NP       Current Outpatient Medications  Medication Sig Dispense Refill   amitriptyline (ELAVIL) 10 MG tablet Take 1 tablet (10 mg total) by mouth at bedtime. 30 tablet 2   amLODipine (NORVASC) 5 MG tablet Take 1 tablet (5 mg total) by mouth daily. 90 tablet 3   aspirin EC 81 MG EC tablet Take 1 tablet (81 mg total) by mouth daily. Swallow whole. 30 tablet 11   Blood Glucose Monitoring Suppl (ONETOUCH VERIO) w/Device KIT Use as directed to check blood sugars 2 times per day dx: e11.65 1 kit 1   clopidogrel (PLAVIX) 75 MG tablet Take 1 tablet (75 mg total) by mouth daily. 30 tablet 3   glucose blood (ONETOUCH VERIO) test strip Use as directed to check blood sugars 2 times per day dx: e11.65 100 each 5   levocetirizine (XYZAL) 5 MG tablet Take 1 tablet (5 mg total) by mouth every evening. 90 tablet 1   metFORMIN (GLUCOPHAGE) 500 MG tablet Take 1 tablet (500 mg total) by mouth 2 (two)  times daily with a meal. 180 tablet 3   metoprolol tartrate (LOPRESSOR) 25 MG tablet Take 0.5 tablets (12.5 mg total) by mouth 2 (two) times daily. 60 tablet 0   OneTouch Delica Lancets 40J MISC Use as directed to check blood sugars 2 times per day dx: e11.65 100 each 5    PTA Medications: (Not in a hospital admission)   Musculoskeletal  Strength & Muscle Tone: within normal limits Gait & Station: normal Patient leans: N/A  Psychiatric Specialty Exam  Presentation  General Appearance: Appropriate for Environment  Eye Contact:Fair  Speech:Clear and Coherent  Speech Volume:Normal  Handedness: No data recorded  Mood and Affect  Mood:Dysphoric  Affect:Appropriate   Thought Process  Thought Processes:Coherent; Goal Directed  Descriptions of Associations:Intact  Orientation:Full (Time, Place and Person)  Thought Content:Logical     Hallucinations:Hallucinations: None  Ideas of Reference:None  Suicidal Thoughts:Suicidal Thoughts: No  Homicidal Thoughts:Homicidal Thoughts: No   Sensorium  Memory:Immediate Fair; Recent Fair; Remote Fair  Judgment:Fair  Insight:Fair   Executive Functions  Concentration:Fair  Attention Span:Fair  [place]   Psychomotor Activity  Psychomotor Activity:Psychomotor Activity: Normal   Assets  Assets:Desire for Improvement; Armed forces logistics/support/administrative officer; Financial Resources/Insurance; Housing; Intimacy; Leisure Time; Physical Health; Social Support   Sleep  Sleep:Sleep: Poor   Nutritional Assessment (For OBS and FBC admissions only) Has the patient had a weight loss or gain of 10 pounds or more in the last 3 months?: No Has the patient had a decrease in food intake/or appetite?: No Does the patient have dental problems?: No Does the patient have eating habits or behaviors that may be indicators of an eating disorder including binging or inducing vomiting?: No Has the patient  recently lost weight without trying?: 0 Has the patient been eating poorly because of a decreased appetite?: 0 Malnutrition Screening Tool Score: 0    Physical Exam  Physical Exam HENT:     Head: Normocephalic.     Nose: Nose normal.  Eyes:     Conjunctiva/sclera: Conjunctivae normal.  Cardiovascular:     Rate and Rhythm: Normal rate.  Pulmonary:     Effort: Pulmonary effort is normal.  Musculoskeletal:  Cervical back: Normal range of motion.  Neurological:     Mental Status: She is alert and oriented to person, place, and time.   Review of Systems  Constitutional: Negative.   HENT: Negative.    Eyes: Negative.   Respiratory: Negative.    Cardiovascular: Negative.   Gastrointestinal: Negative.   Genitourinary: Negative.   Musculoskeletal: Negative.   Skin: Negative.   Neurological: Negative.   Endo/Heme/Allergies: Negative.   Psychiatric/Behavioral:  Negative for suicidal ideas. The patient is nervous/anxious.   Last menstrual period 08/18/2011. There is no height or weight on file to calculate BMI.  Demographic Factors:  NA  Loss Factors: Decline in physical health  Historical Factors: NA  Risk Reduction Factors:   Sense of responsibility to family and Living with another person, especially a relative  Continued Clinical Symptoms:  Panic Attacks  Cognitive Features That Contribute To Risk:  None    Suicide Risk:  Minimal: No identifiable suicidal ideation.  Patients presenting with no risk factors but with morbid ruminations; may be classified as minimal risk based on the severity of the depressive symptoms  Plan Of Care/Follow-up recommendations:  Activity:  as tolerated   Disposition: Discharge to home.  For your behavioral health needs, you are advised to follow up with an outpatient psychiatrist. You are scheduled for an intake appointment with Leanord Hawking, MD, on Saturday, June 03, 2021 at 1:20 pm. This appointment will be  in-person: Leanord Hawking, MD [facility] [address] [place], [place] [address] [phone] You would also benefit from seeing an outpatient therapist. Contact one of the following providers at your earliest opportunity to schedule an intake appointment:  Marissa Calamity, NP 05/25/2021, 7:22 PM

## 2021-05-25 NOTE — BH Assessment (Signed)
[facility] Assessment Progress Note   Per Darrol Angel, NP, this pt does not require psychiatric hospitalization at this time.  Pt is psychiatrically cleared.  At the request of EDP Campbell Stall, DO this writer sought an intake appointment for outpatient psychiatry.  At 13:13 I called [facility] and spoke to [employer].  She has scheduled pt for an appointment on Saturday, 06/03/2021 at 13:20.  This has been included in pt's discharge instructions along with several area therapists for pt to follow up with at pt's initiative.  Dr Pearline Cables and charge nurse Patty have been notified.   Jalene Mullet, [place] Triage Specialist [phone]

## 2021-05-25 NOTE — ED Notes (Addendum)
Pt has signed safe transport consent, has been faxed.  Pt has signed voluntary admission form and will go with pt to [facility]

## 2021-05-25 NOTE — ED Notes (Signed)
60 yo female transfer from [facility] ED d/t increased anxiety r/t recent changes to her medication regimen. Pt arrived via safe transport and hesitant about being admitted to [facility]. Reassured pt of her concerns. Pt stated, "I just want to get my medication regulated because I had a heart attack in July and everything spiraled out of control after that. They changed me from Paxil, which I have been on for years. I started having panic attacks and going through withdrawals. I need my medication adjusted". Calm, cooperative but sad throughout interview process. Denies SI/HI/AVH. Skin assessment completed. Oriented to unit. Meal and drink offered. Pt verbally contract for safety. Will monitor for safety.

## 2021-05-25 NOTE — ED Notes (Signed)
Pt and staff has been waiting for TTS to respond to our call.  Pt is getting anxious.  MD went to patient's room to talk to her.

## 2021-05-25 NOTE — ED Triage Notes (Signed)
Anxiety started yesterday evening.  Patient is not on any anxiety medicine.

## 2021-05-25 NOTE — ED Notes (Signed)
Pt discharged to [facility].  Discharge instructions have been discussed with pt and/or family members.  Pt verbally acknowledges understanding of discharge instructions and endorses comprehension to check out at registration prior to leaving.

## 2021-05-25 NOTE — ED Provider Notes (Signed)
[facility] EMERGENCY DEPT Provider Note   CSN: [id number] Arrival date & time: 05/25/21  0735     History Chief Complaint  Patient presents with   Anxiety    Lisa Casey is a 60 y.o. female.  Pt is a 60 yo female with pmh of anxiety and depression presenting for increasing anxiety. Patient admits to new medication changes since 05/06/2021. States she was on Paxil 20 mg QD and Hydroxyzine 25 mg QD and was changed to Amitriptyline 10 mg QD.  Pt admits to feelings of worthlessness, depressed mood, tearfulness, and decreased sleep. Pt denies any suicidal or homicidal ideation.   The history is provided by the patient. No language interpreter was used.  Anxiety This is a recurrent problem. The current episode started more than 1 week ago. The problem occurs constantly. The problem has been gradually worsening. Pertinent negatives include no chest pain, no abdominal pain and no shortness of breath.      Past Medical History:  Diagnosis Date   Anxiety    HX OF ANXIETY ATTACKS   Back pain, chronic    Cholelithiasis 04/15/2021   Diabetes ([place])    started Metformin in 02/2021   Fibromyalgia    GERD (gastroesophageal reflux disease)    Hypertension    Migraines    HX OF MIGRAINES   Obesity    Shortened PR interval     Patient Active Problem List   Diagnosis Date Noted   Elevated troponin    NSTEMI (non-ST elevated myocardial infarction) ([place]) 04/15/2021   Fibromyalgia 04/15/2021   Headache 04/15/2021   Cholelithiasis 04/15/2021   Anxiety 04/15/2021   Breast pain 04/07/2021   Prediabetes 04/07/2021   Essential hypertension 08/09/2020   Abnormal glucose 08/09/2020   Obesity (BMI 30-39.9) 08/09/2020   Trigger finger, right middle finger 06/22/2020   Leiomyoma of uterus, unspecified 09/14/2011    Past Surgical History:  Procedure Laterality Date   CONSTIPATION     CYSTOSCOPY W/ URETERAL STENT PLACEMENT  09/23/2012   Procedure: CYSTOSCOPY WITH RETROGRADE  PYELOGRAM/URETERAL STENT PLACEMENT;  Surgeon: Alexis Frock, MD;  Location: [facility];  Service: Urology;  Laterality: Right;   CYSTOSCOPY WITH RETROGRADE PYELOGRAM, URETEROSCOPY AND STENT PLACEMENT  10/08/2012   Procedure: CYSTOSCOPY WITH RETROGRADE PYELOGRAM, URETEROSCOPY AND STENT PLACEMENT;  Surgeon: Alexis Frock, MD;  Location: [facility];  Service: Urology;  Laterality: Right;  90 MIN NO RETROGRADE NEEDS FLEX URETEROSCOPE    HOLMIUM LASER APPLICATION  03/06/3558   Procedure: HOLMIUM LASER APPLICATION;  Surgeon: Alexis Frock, MD;  Location: [facility];  Service: Urology;  Laterality: Right;   LEFT HEART CATH AND CORONARY ANGIOGRAPHY N/A 04/17/2021   Procedure: LEFT HEART CATH AND CORONARY ANGIOGRAPHY;  Surgeon: Burnell Blanks, MD;  Location: [facility];  Service: Cardiovascular;  Laterality: N/A;   LEFT HEART CATHETERIZATION WITH CORONARY ANGIOGRAM N/A 10/29/2011   Procedure: LEFT HEART CATHETERIZATION WITH CORONARY ANGIOGRAM;  Surgeon: Leonie Man, MD;  Location: [facility] CATH LAB;  Service: Cardiovascular;  Laterality: N/A;   ROBOTIC ASSISTED LAP VAGINAL HYSTERECTOMY  09/14/2011   Procedure: ROBOTIC ASSISTED LAPAROSCOPIC VAGINAL HYSTERECTOMY;  Surgeon: Agnes Lawrence, MD;  Location: [facility];  Service: Gynecology;  Laterality: N/A;   TUBAL LIGATION       OB History     Gravida  2   Para  2   Term      Preterm      AB      Living  SAB      IAB      Ectopic      Multiple      Live Births              Family History  Problem Relation Age of Onset   Breast cancer Mother 63   Breast cancer Paternal Grandmother    Heart failure Neg Hx    Coronary artery disease Neg Hx     Social History   Tobacco Use   Smoking status: Former    Packs/day: 0.25    Years: 10.00    Pack years: 2.50    Types: Cigarettes   Smokeless tobacco: Never  Vaping Use   Vaping Use: Never used  Substance Use Topics   Alcohol  use: No   Drug use: No    Home Medications Prior to Admission medications   Medication Sig Start Date End Date Taking? Authorizing Provider  amitriptyline (ELAVIL) 10 MG tablet Take 1 tablet (10 mg total) by mouth at bedtime. 05/04/21  Yes [name], FNP  amLODipine (NORVASC) 5 MG tablet Take 1 tablet (5 mg total) by mouth daily. 05/02/21  Yes Deberah Pelton, NP  aspirin EC 81 MG EC tablet Take 1 tablet (81 mg total) by mouth daily. Swallow whole. 04/18/21  Yes Hosie Poisson, MD  clopidogrel (PLAVIX) 75 MG tablet Take 1 tablet (75 mg total) by mouth daily. 04/18/21  Yes Hosie Poisson, MD  fluticasone (FLONASE) 50 MCG/ACT nasal spray Place 2 sprays into both nostrils daily. 11/05/18  Yes Wurst, [place], PA-C  levocetirizine (XYZAL) 5 MG tablet Take 1 tablet (5 mg total) by mouth every evening. 01/02/21  Yes [name], FNP  metFORMIN (GLUCOPHAGE) 500 MG tablet Take 1 tablet (500 mg total) by mouth 2 (two) times daily with a meal. 04/20/21 04/20/22 Yes Ghumman, Ramandeep, NP  metoprolol tartrate (LOPRESSOR) 25 MG tablet Take 0.5 tablets (12.5 mg total) by mouth 2 (two) times daily. 04/17/21  Yes Hosie Poisson, MD  pregabalin (LYRICA) 150 MG capsule Take 1 capsule by mouth twice daily 05/23/21  Yes [name], FNP  RESTASIS 0.05 % ophthalmic emulsion Place 1 drop into both eyes in the morning and at bedtime. 04/14/21  Yes [provider]  acetaminophen (TYLENOL) 500 MG tablet Take 1,000 mg by mouth every 6 (six) hours as needed for mild pain or headache.    [provider]  Blood Glucose Monitoring Suppl (ONETOUCH VERIO) w/Device KIT Use as directed to check blood sugars 2 times per day dx: e11.65 04/20/21   Bary Castilla, NP  cyclobenzaprine (FLEXERIL) 5 MG tablet Take 1 tablet by mouth three times daily as needed for muscle spasm 05/01/21   [name], FNP  glucose blood (ONETOUCH VERIO) test strip Use as directed to check blood sugars 2 times per day dx: e11.65 04/20/21    Bary Castilla, NP  neomycin-polymyxin b-dexamethasone (MAXITROL) 3.5-10000-0.1 OINT Place 1 application into both eyes at bedtime as needed (eye irritation). 03/02/21   [provider]  OneTouch Delica Lancets 27C MISC Use as directed to check blood sugars 2 times per day dx: e11.65 04/20/21   Bary Castilla, NP  Probiotic Product (PROBIOTIC PO) Take 1 capsule by mouth daily.    [provider]    Allergies    Codeine  Review of Systems   Review of Systems  Constitutional:  Negative for chills and fever.  HENT:  Negative for ear pain and sore throat.   Eyes:  Negative for  pain and visual disturbance.  Respiratory:  Negative for cough and shortness of breath.   Cardiovascular:  Negative for chest pain and palpitations.  Gastrointestinal:  Negative for abdominal pain and vomiting.  Genitourinary:  Negative for dysuria and hematuria.  Musculoskeletal:  Negative for arthralgias and back pain.  Skin:  Negative for color change and rash.  Neurological:  Negative for seizures and syncope.  Psychiatric/Behavioral:  Positive for decreased concentration and sleep disturbance. The patient is nervous/anxious.   All other systems reviewed and are negative.  Physical Exam Updated Vital Signs BP 131/81 (BP Location: Right Arm)   Pulse 77   Temp 98.9 F (37.2 C) (Oral)   Resp 17   Ht $R'5\' 5"'oA$  (1.651 m)   Wt 90.7 kg   LMP 08/18/2011   SpO2 97%   BMI 33.28 kg/m   Physical Exam Vitals and nursing note reviewed.  Constitutional:      General: She is not in acute distress.    Appearance: She is well-developed.  HENT:     Head: Normocephalic and atraumatic.  Eyes:     Conjunctiva/sclera: Conjunctivae normal.  Cardiovascular:     Rate and Rhythm: Normal rate and regular rhythm.     Heart sounds: No murmur heard. Pulmonary:     Effort: Pulmonary effort is normal. No respiratory distress.     Breath sounds: Normal breath sounds.  Abdominal:     Palpations: Abdomen is  soft.     Tenderness: There is no abdominal tenderness.  Musculoskeletal:     Cervical back: Neck supple.  Skin:    General: Skin is warm and dry.  Neurological:     Mental Status: She is alert.  Psychiatric:        Attention and Perception: She does not perceive auditory or visual hallucinations.        Mood and Affect: Mood is anxious and depressed. Affect is tearful.        Speech: Speech normal.        Behavior: Behavior normal. Behavior is cooperative.        Thought Content: Thought content normal.    ED Results / Procedures / Treatments   Labs (all labs ordered are listed, but only abnormal results are displayed) Labs Reviewed - No data to display  EKG None  Radiology No results found.  Procedures Procedures   Medications Ordered in ED Medications - No data to display  ED Course  I have reviewed the triage vital signs and the nursing notes.  Pertinent labs & imaging results that were available during my care of the patient were reviewed by me and considered in my medical decision making (see chart for details).    MDM Rules/Calculators/A&P                          8:14 AM 60 yo female with pmh of anxiety and depression presenting for increasing anxiety, tearfulness, depression, and sleep changes after psychiatric medication changes on 8/20. Pt medically cleared. Concerns that patient is withdrawing from Paxil. Pt evaluated by TTS and psychiatry with recommendations for [facility] transport and stay for changes/regulation of medications. Patient agreeable to plan and safe upon transfer.          Final Clinical Impression(s) / ED Diagnoses Final diagnoses:  Anxiety    Rx / DC Orders ED Discharge Orders     None        Lianne Cure, DO  05/26/21 1321

## 2021-05-25 NOTE — ED Notes (Signed)
Patient is aware that she is going the behavioral health urgent care with her fiance at the bedside.  She is encouraged to call  Dr. Altamese [place] to verify for her appointment on Sept. 17.  Patient and fiance verbalized understanding.

## 2021-05-25 NOTE — ED Notes (Signed)
Pt up to nurse's station asking if she can pack her stuff and leave.

## 2021-05-26 ENCOUNTER — Encounter: Payer: Self-pay | Admitting: Nurse Practitioner

## 2021-06-12 ENCOUNTER — Other Ambulatory Visit: Payer: Self-pay | Admitting: Nurse Practitioner

## 2021-06-12 MED ORDER — HYDROXYZINE PAMOATE 25 MG PO CAPS: 25.0000 mg | ORAL_CAPSULE | Freq: Three times a day (TID) | ORAL | 2 refills | Status: DC | PRN

## 2021-06-19 ENCOUNTER — Other Ambulatory Visit: Payer: Self-pay | Admitting: Nurse Practitioner

## 2021-06-19 DIAGNOSIS — M797 Fibromyalgia: Secondary | ICD-10-CM

## 2021-06-21 ENCOUNTER — Other Ambulatory Visit: Payer: Self-pay | Admitting: Nurse Practitioner

## 2021-06-21 DIAGNOSIS — M797 Fibromyalgia: Secondary | ICD-10-CM

## 2021-06-29 ENCOUNTER — Ambulatory Visit: Payer: BC Managed Care – PPO | Admitting: Nurse Practitioner

## 2021-06-29 ENCOUNTER — Encounter: Payer: Self-pay | Admitting: Nurse Practitioner

## 2021-06-29 ENCOUNTER — Other Ambulatory Visit: Payer: Self-pay

## 2021-06-29 ENCOUNTER — Other Ambulatory Visit: Payer: Self-pay | Admitting: Nurse Practitioner

## 2021-06-29 VITALS — BP 114/80 | HR 76 | Temp 98.1°F | Ht 65.0 in | Wt 199.0 lb

## 2021-06-29 DIAGNOSIS — Z23 Encounter for immunization: Secondary | ICD-10-CM | POA: Diagnosis not present

## 2021-06-29 DIAGNOSIS — N200 Calculus of kidney: Secondary | ICD-10-CM

## 2021-06-29 DIAGNOSIS — M797 Fibromyalgia: Secondary | ICD-10-CM

## 2021-06-29 DIAGNOSIS — I10 Essential (primary) hypertension: Secondary | ICD-10-CM | POA: Diagnosis not present

## 2021-06-29 DIAGNOSIS — I252 Old myocardial infarction: Secondary | ICD-10-CM

## 2021-06-29 DIAGNOSIS — F419 Anxiety disorder, unspecified: Secondary | ICD-10-CM | POA: Diagnosis not present

## 2021-06-29 DIAGNOSIS — I7090 Unspecified atherosclerosis: Secondary | ICD-10-CM | POA: Diagnosis not present

## 2021-06-29 DIAGNOSIS — R0683 Snoring: Secondary | ICD-10-CM

## 2021-06-29 DIAGNOSIS — E6609 Other obesity due to excess calories: Secondary | ICD-10-CM

## 2021-06-29 DIAGNOSIS — R82998 Other abnormal findings in urine: Secondary | ICD-10-CM

## 2021-06-29 DIAGNOSIS — Z6833 Body mass index (BMI) 33.0-33.9, adult: Secondary | ICD-10-CM

## 2021-06-29 DIAGNOSIS — R7303 Prediabetes: Secondary | ICD-10-CM | POA: Diagnosis not present

## 2021-06-29 LAB — POCT URINALYSIS DIPSTICK
Bilirubin, UA: NEGATIVE
Blood, UA: NEGATIVE
Glucose, UA: NEGATIVE
Nitrite, UA: NEGATIVE
Protein, UA: NEGATIVE
Spec Grav, UA: 1.025 (ref 1.010–1.025)
Urobilinogen, UA: 0.2 E.U./dL
pH, UA: 5.5 (ref 5.0–8.0)

## 2021-06-29 LAB — CMP14+EGFR
ALT: 17 IU/L (ref 0–32)
AST: 13 IU/L (ref 0–40)
Albumin/Globulin Ratio: 2 (ref 1.2–2.2)
Albumin: 4.7 g/dL (ref 3.8–4.9)
Alkaline Phosphatase: 69 IU/L (ref 44–121)
BUN/Creatinine Ratio: 23 (ref 12–28)
BUN: 18 mg/dL (ref 8–27)
Bilirubin Total: 0.7 mg/dL (ref 0.0–1.2)
CO2: 22 mmol/L (ref 20–29)
Calcium: 9.6 mg/dL (ref 8.7–10.3)
Chloride: 104 mmol/L (ref 96–106)
Creatinine, Ser: 0.8 mg/dL (ref 0.57–1.00)
Globulin, Total: 2.4 g/dL (ref 1.5–4.5)
Glucose: 100 mg/dL — ABNORMAL HIGH (ref 70–99)
Potassium: 4 mmol/L (ref 3.5–5.2)
Sodium: 141 mmol/L (ref 134–144)
Total Protein: 7.1 g/dL (ref 6.0–8.5)
eGFR: 84 mL/min/{1.73_m2} (ref >59)

## 2021-06-29 LAB — POCT UA - MICROALBUMIN
Albumin/Creatinine Ratio, Urine, POC: 30
Creatinine, POC: 300 mg/dL
Microalbumin Ur, POC: 30 mg/L

## 2021-06-29 LAB — HEMOGLOBIN A1C
Est. average glucose Bld gHb Est-mCnc: 134 mg/dL
Hgb A1c MFr Bld: 6.3 % — ABNORMAL HIGH (ref 4.8–5.6)

## 2021-06-29 MED ORDER — ATORVASTATIN CALCIUM 10 MG PO TABS: ORAL_TABLET | ORAL | 1 refills | Status: DC

## 2021-06-29 MED ORDER — METOPROLOL TARTRATE 25 MG PO TABS: 12.5000 mg | ORAL_TABLET | Freq: Two times a day (BID) | ORAL | 0 refills | Status: DC

## 2021-06-29 NOTE — Progress Notes (Signed)
I,Yamilka J Llittleton,acting as a Education administrator for [employer], FNP.,have documented all relevant documentation on the behalf of [name], FNP,as directed by  [name], FNP while in the presence of [name], [place].   This visit occurred during the SARS-CoV-2 public health emergency.  Safety protocols were in place, including screening questions prior to the visit, additional usage of staff PPE, and extensive cleaning of exam room while observing appropriate contact time as indicated for disinfecting solutions.  Subjective:     Patient ID: Lisa Casey , female    DOB: 31-Dec-1960 , 60 y.o.   MRN: [id number]   Chief Complaint  Patient presents with   Anxiety     HPI  Patient presents today for anxiety. She was put back on hydroxyzine and she is tolerating it well. She is taking daily once a day. She no longer has problems sleeping, she is no longer taking amitriptylline. She is taking her cardiac medications 3 times a day and has been sticking to a regimen  Wt Readings from Last 3 Encounters: 06/29/21 : 199 lb (90.3 kg) 05/25/21 : 200 lb (90.7 kg) 05/04/21 : 205 lb 9.6 oz (93.3 kg)     Anxiety Presents for follow-up visit. Symptoms include decreased concentration, insomnia and nervous/anxious behavior. Patient reports no chest pain, dizziness or palpitations. The quality of sleep is poor. Nighttime awakenings: one to two, several (2-3 times).    Insomnia Primary symptoms: sleep disturbance.   Typical bedtime:  11-12 P.M. (she will sleep until 5 am and will have to come home on her break to take a nap).  PMH includes: no hypertension.     Past Medical History:  Diagnosis Date   Anxiety    HX OF ANXIETY ATTACKS   Back pain, chronic    Cholelithiasis 04/15/2021   Diabetes ([place])    started Metformin in 02/2021   Fibromyalgia    GERD (gastroesophageal reflux disease)    Hypertension    Migraines    HX OF MIGRAINES   Obesity    Shortened PR interval      Family  History  Problem Relation Age of Onset   Breast cancer Mother 76   Breast cancer Paternal Grandmother    Heart failure Neg Hx    Coronary artery disease Neg Hx      Current Outpatient Medications:    amLODipine (NORVASC) 5 MG tablet, Take 1 tablet (5 mg total) by mouth daily., Disp: 90 tablet, Rfl: 3   aspirin EC 81 MG EC tablet, Take 1 tablet (81 mg total) by mouth daily. Swallow whole., Disp: 30 tablet, Rfl: 11   atorvastatin (LIPITOR) 10 MG tablet, Take 1 tab by mouth MWF, Disp: 45 tablet, Rfl: 1   Blood Glucose Monitoring Suppl (ONETOUCH VERIO) w/Device KIT, Use as directed to check blood sugars 2 times per day dx: e11.65, Disp: 1 kit, Rfl: 1   clopidogrel (PLAVIX) 75 MG tablet, Take 1 tablet (75 mg total) by mouth daily., Disp: 30 tablet, Rfl: 3   glucose blood (ONETOUCH VERIO) test strip, Use as directed to check blood sugars 2 times per day dx: e11.65, Disp: 100 each, Rfl: 5   hydrOXYzine (VISTARIL) 25 MG capsule, Take 1 capsule (25 mg total) by mouth 3 (three) times daily as needed., Disp: 30 capsule, Rfl: 2   levocetirizine (XYZAL) 5 MG tablet, Take 1 tablet (5 mg total) by mouth every evening., Disp: 90 tablet, Rfl: 1   metFORMIN (GLUCOPHAGE) 500 MG tablet, Take 1 tablet (  500 mg total) by mouth 2 (two) times daily with a meal., Disp: 180 tablet, Rfl: 3   OneTouch Delica Lancets 56E MISC, Use as directed to check blood sugars 2 times per day dx: e11.65, Disp: 100 each, Rfl: 5   metoprolol tartrate (LOPRESSOR) 25 MG tablet, Take 0.5 tablets (12.5 mg total) by mouth 2 (two) times daily., Disp: 60 tablet, Rfl: 0   Allergies  Allergen Reactions   Codeine Itching    With rash     Review of Systems  Constitutional: Negative.   Respiratory: Negative.    Cardiovascular:  Negative for chest pain, palpitations and leg swelling.  Neurological:  Negative for dizziness and headaches.  Psychiatric/Behavioral:  Positive for decreased concentration and sleep disturbance. The patient is  nervous/anxious and has insomnia.     Today's Vitals   06/29/21 1000  BP: 114/80  Pulse: 76  Temp: 98.1 F (36.7 C)  Weight: 199 lb (90.3 kg)  Height: $Remove'5\' 5"'ZvTaPgv$  (1.651 m)  PainSc: 0-No pain   Body mass index is 33.12 kg/m.   Objective:  Physical Exam Vitals reviewed.  Constitutional:      General: She is not in acute distress.    Appearance: Normal appearance. She is obese.  Cardiovascular:     Rate and Rhythm: Normal rate and regular rhythm.     Pulses: Normal pulses.     Heart sounds: Normal heart sounds. No murmur heard. Pulmonary:     Effort: Pulmonary effort is normal. No respiratory distress.     Breath sounds: Normal breath sounds. No wheezing.  Neurological:     General: No focal deficit present.     Mental Status: She is alert and oriented to person, place, and time.     Cranial Nerves: No cranial nerve deficit.     Motor: No weakness.  Psychiatric:        Mood and Affect: Mood normal.        Behavior: Behavior normal.        Thought Content: Thought content normal.        Judgment: Judgment normal.        Assessment And Plan:     1. Anxiety Comments: Continue hydroxyzine, no longer taking amitriptylline  2. Essential hypertension Comments: Blood pressure is under good control Continue current medications - metoprolol tartrate (LOPRESSOR) 25 MG tablet; Take 0.5 tablets (12.5 mg total) by mouth 2 (two) times daily.  Dispense: 60 tablet; Refill: 0  3. Atherosclerosis Comments: Incidental finding with CT chest, will start on statin MWF Discussed side effects to include muscle weakness and leg cramping - atorvastatin (LIPITOR) 10 MG tablet; Take 1 tab by mouth MWF  Dispense: 45 tablet; Refill: 1  4. Kidney stone Comments: Incidental finding with her CT chest, will refer to Urology  - Ambulatory referral to Urology  5. Prediabetes Comments: Stable, continue with healthy diet - Hemoglobin A1c - CMP14+EGFR - POCT Urinalysis Dipstick (81002) - POCT UA -  Microalbumin  6. Snoring Comments: She needs to be evaluated for sleep apnea, epworth sleep score -  Recent MI in July 2022 - Ambulatory referral to Sleep Studies  7. Class 1 obesity due to excess calories without serious comorbidity with body mass index (BMI) of 33.0 to 33.9 in adult Chronic Discussed healthy diet and regular exercise options  Encouraged to exercise at least 150 minutes per week with 2 days of strength training  8. Immunization due Influenza vaccine administered Encouraged to take Tylenol as needed for fever or  muscle aches. - Flu Vaccine QUAD 6+ mos PF IM (Fluarix Quad PF)  9. History of non-ST elevation myocardial infarction (NSTEMI) Continue follow up with Cardiology Will send for sleep study to evaluate for possible  - metoprolol tartrate (LOPRESSOR) 25 MG tablet; Take 0.5 tablets (12.5 mg total) by mouth 2 (two) times daily.  Dispense: 60 tablet; Refill: 0 - Ambulatory referral to Sleep Studies  10. Urine white blood cells increased - Urine Culture    Patient was given opportunity to ask questions. Patient verbalized understanding of the plan and was able to repeat key elements of the plan. All questions were answered to their satisfaction.  [name], FNP   I, [name], FNP, have reviewed all documentation for this visit. The documentation on 07/13/21 for the exam, diagnosis, procedures, and orders are all accurate and complete.   IF YOU HAVE BEEN REFERRED TO A SPECIALIST, IT MAY TAKE 1-2 WEEKS TO SCHEDULE/PROCESS THE REFERRAL. IF YOU HAVE NOT HEARD FROM US/SPECIALIST IN TWO WEEKS, PLEASE GIVE [place] A CALL AT [phone] X [phone].   THE PATIENT IS ENCOURAGED TO PRACTICE SOCIAL DISTANCING DUE TO THE COVID-19 PANDEMIC.

## 2021-07-02 LAB — URINE CULTURE

## 2021-07-06 ENCOUNTER — Ambulatory Visit: Payer: BC Managed Care – PPO | Admitting: Internal Medicine

## 2021-07-09 ENCOUNTER — Other Ambulatory Visit: Payer: Self-pay | Admitting: Nurse Practitioner

## 2021-07-09 DIAGNOSIS — R0982 Postnasal drip: Secondary | ICD-10-CM

## 2021-07-09 DIAGNOSIS — M797 Fibromyalgia: Secondary | ICD-10-CM

## 2021-07-19 DIAGNOSIS — E785 Hyperlipidemia, unspecified: Secondary | ICD-10-CM | POA: Insufficient documentation

## 2021-07-19 DIAGNOSIS — E119 Type 2 diabetes mellitus without complications: Secondary | ICD-10-CM | POA: Insufficient documentation

## 2021-07-28 ENCOUNTER — Telehealth: Payer: Self-pay | Admitting: Internal Medicine

## 2021-07-28 ENCOUNTER — Other Ambulatory Visit: Payer: Self-pay | Admitting: Urology

## 2021-07-28 NOTE — Telephone Encounter (Signed)
   [facility] Pre-operative Risk Assessment    Patient Name: Lisa Casey  DOB: [birth year] MRN: [id number]  HEARTCARE STAFF:  - IMPORTANT!!!!!! Under Visit Info/Reason for Call, type in Other and utilize the format Clearance MM/DD/YY or Clearance TBD. Do not use dashes or single digits. - Please review there is not already an duplicate clearance open for this procedure. - If request is for dental extraction, please clarify the # of teeth to be extracted. - If the patient is currently at the dentist's office, call Pre-Op Callback Staff (MA/nurse) to input urgent request.  - If the patient is not currently in the dentist office, please route to the Pre-Op pool.  Request for surgical clearance:  What type of surgery is being performed? Ureteroscopy for kidney stones   When is this surgery scheduled? 08/21/21  What type of clearance is required (medical clearance vs. Pharmacy clearance to hold med vs. Both)? Both   Are there any medications that need to be held prior to surgery and how long? Plavix 5 days preferred   Practice name and name of physician performing surgery? [facility]/ Lisa Casey   What is the office phone number? [phone]   7.   What is the office fax number? [phone]  8.   Anesthesia type (None, local, MAC, general) ? General    Lisa Casey 07/28/2021, 12:20 PM  _________________________________________________________________   (provider comments below)

## 2021-07-28 NOTE — Telephone Encounter (Signed)
    Patient Name: Lisa Casey  DOB: 1961-04-02 MRN: [id number]  Primary Cardiologist: Elouise Munroe, MD  Chart reviewed as part of pre-operative protocol coverage. Given past medical history and time since last visit, based on ACC/AHA guidelines, [name] would be at acceptable risk for the planned procedure without further cardiovascular testing.    Patient underwent cardiac catheterization 04/17/21 for NSTEMI which showed no coronary obstruction.  She underwent cardiac MRI which showed small area of myocardial edema and subendocardial delayed enhancement in the inferolateral LV apex, consistent with infarction.  Placed on DAPT with ASA and Plavix.   Dr. Margaretann Loveless, Is it premature to hold Plavix as requested? Currently, patient denies any pain from stone. Please provide your recommendations.   Please forward your response to P [facility] DIV PREOP.   Thank you   Leanor Kail, PA 07/28/2021, 2:09 PM

## 2021-07-31 NOTE — Telephone Encounter (Signed)
   Patient Name: Lisa Casey  DOB: 09-15-1961 MRN: [id number]  Primary Cardiologist: Elouise Munroe, MD  Chart reviewed as part of pre-operative protocol coverage. Pre-op clearance already addressed by colleagues in earlier phone notes. To summarize recommendations:  - Per Robbie Lis, PA-C: "Given past medical history and time since last visit, based on ACC/AHA guidelines, Lisa Casey would be at acceptable risk for the planned procedure without further cardiovascular testing."  - Per Dr. Margaretann Loveless: "I think it would be safe to hold Plavix for 5 days prior to procedure, and continue aspirin throughout the periprocedural period. Please resume plavix when safe from postoperative standpoint. "  Will route this bundled recommendation to requesting provider via Epic fax function and remove from pre-op pool. Please call with questions.  Charlie Pitter, PA-C 07/31/2021, 11:08 AM

## 2021-08-07 ENCOUNTER — Other Ambulatory Visit: Payer: Self-pay | Admitting: Nurse Practitioner

## 2021-08-07 DIAGNOSIS — M797 Fibromyalgia: Secondary | ICD-10-CM

## 2021-08-07 DIAGNOSIS — R0982 Postnasal drip: Secondary | ICD-10-CM

## 2021-08-15 NOTE — Patient Instructions (Signed)
DUE TO COVID-19 ONLY ONE VISITOR IS ALLOWED TO COME WITH YOU AND STAY IN THE WAITING ROOM ONLY DURING PRE OP AND PROCEDURE DAY OF SURGERY.   Up to two visitors ages 16+ are allowed at one time in a patient's room.  The visitors may rotate out with other people throughout the day.  Additionally, up to two children between the ages of 63 and 37 are allowed and do not count toward the number of allowed visitors.  Children within this age range must be accompanied by an adult visitor.  One adult visitor may remain with the patient overnight and must be in the room by 8 PM.          Your procedure is scheduled on: 08-21-21   Report to [facility] Main  Entrance   Report to admitting at      [place] AM     Call this number if you have problems the morning of surgery [phone]   Remember: Do not eat food  :After Midnight.You may have clear liquids until 0430 am then nothing by mouth    CLEAR LIQUID DIET                                                                    water Black Coffee and tea, regular and decaf No Creamer                            Plain Jell-O any favor except red or purple                                  Fruit ices (not with fruit pulp)                                      Iced Popsicles                                     Carbonated beverages, regular and diet                                    Cranberry, grape and apple juices Sports drinks like Gatorade Lightly seasoned clear broth or consume(fat free) Sugar, honey syrup  _____________________________________________________________________     BRUSH YOUR TEETH MORNING OF SURGERY AND RINSE YOUR MOUTH OUT, NO CHEWING GUM CANDY OR MINTS.     Stop taking _Plavix___________as directed by your Surgeon/Cardiologist.  Contact your Surgeon/Cardiologist for instructions on Anticoagulant Therapy prior to surgery.    Take these medicines the morning of surgery with A SIP OF WATER: metoprolol, atorvastatin,  amlodipine  DO NOT TAKE ANY DIABETIC MEDICATIONS DAY OF YOUR SURGERY                               You may not have any metal on your body including hair pins and  piercings  Do not wear jewelry, make-up, lotions, powders,perfumes,        deodorant             Do not wear nail polish on your fingernails or toenails .  Do not shave  48 hours prior to surgery.          Do not bring valuables to the hospital. [place].  Contacts, dentures or bridgework may not be worn into surgery.      Patients discharged the day of surgery will not be allowed to drive home. IF YOU ARE HAVING SURGERY AND GOING HOME THE SAME DAY, YOU MUST HAVE AN ADULT TO DRIVE YOU HOME AND BE WITH YOU FOR 24 HOURS. YOU MAY GO HOME BY TAXI OR UBER OR ORTHERWISE, BUT AN ADULT MUST ACCOMPANY YOU HOME AND STAY WITH YOU FOR 24 HOURS.  Name and phone number of your driver:  Special Instructions: N/A              Please read over the following fact sheets you were given: _____________________________________________________________________             [facility] - Preparing for Surgery Before surgery, you can play an important role.  Because skin is not sterile, your skin needs to be as free of germs as possible.  You can reduce the number of germs on your skin by washing with CHG (chlorahexidine gluconate) soap before surgery.  CHG is an antiseptic cleaner which kills germs and bonds with the skin to continue killing germs even after washing. Please DO NOT use if you have an allergy to CHG or antibacterial soaps.  If your skin becomes reddened/irritated stop using the CHG and inform your nurse when you arrive at Short Stay. Do not shave (including legs and underarms) for at least 48 hours prior to the first CHG shower.  You may shave your face/neck. Please follow these instructions carefully:  1.  Shower with CHG Soap the night before surgery and the  morning of  Surgery.  2.  If you choose to wash your hair, wash your hair first as usual with your  normal  shampoo.  3.  After you shampoo, rinse your hair and body thoroughly to remove the  shampoo.                           4.  Use CHG as you would any other liquid soap.  You can apply chg directly  to the skin and wash                       Gently with a scrungie or clean washcloth.  5.  Apply the CHG Soap to your body ONLY FROM THE NECK DOWN.   Do not use on face/ open                           Wound or open sores. Avoid contact with eyes, ears mouth and genitals (private parts).                       Wash face,  Genitals (private parts) with your normal soap.             6.  Wash thoroughly, paying special attention to  the area where your surgery  will be performed.  7.  Thoroughly rinse your body with warm water from the neck down.  8.  DO NOT shower/wash with your normal soap after using and rinsing off  the CHG Soap.                9.  Pat yourself dry with a clean towel.            10.  Wear clean pajamas.            11.  Place clean sheets on your bed the night of your first shower and do not  sleep with pets. Day of Surgery : Do not apply any lotions/deodorants the morning of surgery.  Please wear clean clothes to the hospital/surgery center.  FAILURE TO FOLLOW THESE INSTRUCTIONS MAY RESULT IN THE CANCELLATION OF YOUR SURGERY PATIENT SIGNATURE_________________________________  NURSE SIGNATURE__________________________________  ________________________________________________________________________

## 2021-08-15 NOTE — Progress Notes (Addendum)
PCP - [name] , MD Cardiologist - Dr. Diana Eves Clearance Melina Copa PA-C 07-31-21 epic  PPM/ICD -  Device Orders -  Rep Notified -   Chest x-ray - 03-2021 EKG - 05-02-21 epic Stress Test -  ECHO - 04-15-21 epic Cardiac Cath - 04-17-21 epic  Sleep Study -  CPAP -   Fasting Blood Sugar - 140's Checks Blood Sugar _3____ times a week  Blood Thinner Instructions:Plavix hold 5 days, last dose 11-30 Aspirin Instructions:81 mg remain on   ERAS Protcol - PRE-SURGERY Ensure or G2-   COVID TEST- N/A COVID vaccine -yes x4   Activity--Able to walk around home without SOB some SOB when walking stairs in home  Since MI  not new MD aware  Anesthesia review: DM, MI July 2022 , WBC 2.4  Patient denies shortness of breath, fever, cough and chest pain at PAT appointment   All instructions explained to the patient, with a verbal understanding of the material. Patient agrees to go over the instructions while at home for a better understanding. Patient also instructed to self quarantine after being tested for COVID-19. The opportunity to ask questions was provided.

## 2021-08-17 ENCOUNTER — Encounter (HOSPITAL_COMMUNITY): Payer: Self-pay

## 2021-08-17 ENCOUNTER — Other Ambulatory Visit: Payer: Self-pay

## 2021-08-17 ENCOUNTER — Encounter (HOSPITAL_COMMUNITY)
Admission: RE | Admit: 2021-08-17 | Discharge: 2021-08-17 | Disposition: A | Discharge status: Home / Self Care | Payer: BC Managed Care – PPO | Source: Ambulatory Visit | Attending: Urology | Admitting: Urology

## 2021-08-17 VITALS — BP 149/67 | HR 89 | Temp 98.7°F | Resp 16 | Ht 65.0 in | Wt 194.0 lb

## 2021-08-17 DIAGNOSIS — Z7902 Long term (current) use of antithrombotics/antiplatelets: Secondary | ICD-10-CM | POA: Insufficient documentation

## 2021-08-17 DIAGNOSIS — Z01812 Encounter for preprocedural laboratory examination: Secondary | ICD-10-CM | POA: Insufficient documentation

## 2021-08-17 DIAGNOSIS — I10 Essential (primary) hypertension: Secondary | ICD-10-CM | POA: Insufficient documentation

## 2021-08-17 DIAGNOSIS — Z87891 Personal history of nicotine dependence: Secondary | ICD-10-CM | POA: Diagnosis not present

## 2021-08-17 DIAGNOSIS — E119 Type 2 diabetes mellitus without complications: Secondary | ICD-10-CM | POA: Insufficient documentation

## 2021-08-17 DIAGNOSIS — K219 Gastro-esophageal reflux disease without esophagitis: Secondary | ICD-10-CM | POA: Insufficient documentation

## 2021-08-17 DIAGNOSIS — N201 Calculus of ureter: Secondary | ICD-10-CM | POA: Diagnosis not present

## 2021-08-17 DIAGNOSIS — Z7982 Long term (current) use of aspirin: Secondary | ICD-10-CM | POA: Insufficient documentation

## 2021-08-17 HISTORY — DX: Personal history of urinary calculi: Z87.442

## 2021-08-17 HISTORY — DX: Pneumonia, unspecified organism: J18.9

## 2021-08-17 HISTORY — DX: Acute myocardial infarction, unspecified: I21.9

## 2021-08-17 HISTORY — DX: Bronchitis, not specified as acute or chronic: J40

## 2021-08-17 LAB — BASIC METABOLIC PANEL
Anion gap: 6 (ref 5–15)
BUN: 15 mg/dL (ref 8–23)
CO2: 26 mmol/L (ref 22–32)
Calcium: 8.9 mg/dL (ref 8.9–10.3)
Chloride: 109 mmol/L (ref 98–111)
Creatinine, Ser: 0.7 mg/dL (ref 0.44–1.00)
GFR, Estimated: >60 mL/min (ref >60)
Glucose, Bld: 110 mg/dL — ABNORMAL HIGH (ref 70–99)
Potassium: 3.1 mmol/L — ABNORMAL LOW (ref 3.5–5.1)
Sodium: 141 mmol/L (ref 135–145)

## 2021-08-17 LAB — CBC
HCT: 40.3 % (ref 36.0–46.0)
Hemoglobin: 13.2 g/dL (ref 12.0–15.0)
MCH: 30.5 pg (ref 26.0–34.0)
MCHC: 32.8 g/dL (ref 30.0–36.0)
MCV: 93.1 fL (ref 80.0–100.0)
Platelets: 250 10*3/uL (ref 150–400)
RBC: 4.33 MIL/uL (ref 3.87–5.11)
RDW: 12.3 % (ref 11.5–15.5)
WBC: 2.4 10*3/uL — ABNORMAL LOW (ref 4.0–10.5)
nRBC: 0 % (ref 0.0–0.2)

## 2021-08-17 LAB — GLUCOSE, CAPILLARY: Glucose-Capillary: 150 mg/dL — ABNORMAL HIGH (ref 70–99)

## 2021-08-17 NOTE — Progress Notes (Signed)
No Answer for pharmacy med tech. Pt. Will call pharmacy herself

## 2021-08-18 NOTE — Anesthesia Preprocedure Evaluation (Addendum)
Anesthesia Evaluation  Patient identified by MRN, date of birth, ID band Patient awake    Reviewed: Allergy & Precautions, NPO status , Patient's Chart, lab work & pertinent test results  Airway Mallampati: II       Dental no notable dental hx.    Pulmonary neg pulmonary ROS, former smoker,    Pulmonary exam normal        Cardiovascular hypertension, Pt. on medications and Pt. on home beta blockers + CAD and + Past MI (07/22)  Normal cardiovascular exam Rhythm:Regular     Neuro/Psych  Headaches, Anxiety    GI/Hepatic Neg liver ROS, GERD  ,  Endo/Other  diabetes, Type 2, Oral Hypoglycemic Agents  Renal/GU Ureteral stone  negative genitourinary   Musculoskeletal  (+) Fibromyalgia -  Abdominal Normal abdominal exam  (+)   Peds  Hematology negative hematology ROS (+)   Anesthesia Other Findings   Reproductive/Obstetrics                           Anesthesia Physical Anesthesia Plan  ASA: 3  Anesthesia Plan: General   Post-op Pain Management:    Induction: Intravenous  PONV Risk Score and Plan: 3 and Ondansetron, Dexamethasone, Midazolam and Treatment may vary due to age or medical condition  Airway Management Planned: Mask and LMA  Additional Equipment: None  Intra-op Plan:   Post-operative Plan: Extubation in OR  Informed Consent: I have reviewed the patients History and Physical, chart, labs and discussed the procedure including the risks, benefits and alternatives for the proposed anesthesia with the patient or authorized representative who has indicated his/her understanding and acceptance.     Dental advisory given  Plan Discussed with: CRNA  Anesthesia Plan Comments: (See PAT note 08/17/21, Konrad Felix Ward, PA-C Lab Results      Component                Value               Date                      WBC                      2.4 (L)             08/17/2021                 HGB                      13.2                08/17/2021                HCT                      40.3                08/17/2021                MCV                      93.1                08/17/2021                PLT  250                 08/17/2021           Lab Results      Component                Value               Date                      NA                       141                 08/17/2021                K                        3.1 (L)             08/17/2021                CO2                      26                  08/17/2021                GLUCOSE                  110 (H)             08/17/2021                BUN                      15                  08/17/2021                CREATININE               0.70                08/17/2021                CALCIUM                  8.9                 08/17/2021                EGFR                     84                  06/29/2021                GFRNONAA                 >60                 08/17/2021           Cardiac Cath 04/17/21 No angiographic evidence of CAD LVEDP 15-17 mmHg  Recommendations: No further ischemic testing. Consider coronary vasospasm as the etiology of her chest pain and elevated troponin  Echo 04/15/2021 1. Left ventricular ejection fraction, by estimation, is 65 to 70%.  The  left ventricle has normal function. The left ventricle has no regional  wall motion abnormalities. Left ventricular diastolic parameters were  normal.  2. Right ventricular systolic function is normal. The right ventricular  size is normal. There is normal pulmonary artery systolic pressure.  3. The mitral valve is normal in structure. Trivial mitral valve  regurgitation. No evidence of mitral stenosis.  4. The aortic valve is tricuspid. Aortic valve regurgitation is not  visualized. No aortic stenosis is present.  5. The inferior vena cava is normal in size with greater than 50%  respiratory variability,  suggesting right atrial pressure of 3 mmHg.)      Anesthesia Quick Evaluation

## 2021-08-18 NOTE — Progress Notes (Signed)
Anesthesia Chart Review   Case: [id number] Date/Time: 08/21/21 0715   Procedure: CYSTOSCOPY RIGHT /URETEROSCOPY/HOLMIUM LASER/STENT PLACEMENT (Right)   Anesthesia type: General   Pre-op diagnosis: RIGHT URETERAL STONE   Location: [facility] PROC[facility]   Surgeons: Lucas Mallow, MD       DISCUSSION:60 y.o. former smoker with h/o GERD, HTN, DM II, CAD, right ureteral stone scheduled for above procedure 08/21/2021 with Dr. Link Snuffer.   Per cardiology preoperative evaluation 07/31/2021, "Chart reviewed as part of pre-operative protocol coverage. Pre-op clearance already addressed by colleagues in earlier phone notes. To summarize recommendations:   - Per Robbie Lis, PA-C: "Given past medical history and time since last visit, based on ACC/AHA guidelines, Lisa Casey would be at acceptable risk for the planned procedure without further cardiovascular testing."   - Per Dr. Margaretann Loveless: "I think it would be safe to hold Plavix for 5 days prior to procedure, and continue aspirin throughout the periprocedural period. Please resume plavix when safe from postoperative standpoint. "  Anticipate pt can proceed with planned procedure barring acute status change.   VS: BP (!) 149/67   Pulse 89   Temp 37.1 C (Oral)   Resp 16   Ht _0  (1.651 m)   Wt 88 kg   LMP 08/18/2011   SpO2 99%   BMI 32.28 kg/m   PROVIDERS: [name], FNP is PCP   Primary Cardiologist: Elouise Munroe, MD LABS: Labs reviewed: Acceptable for surgery. (all labs ordered are listed, but only abnormal results are displayed)  Labs Reviewed  GLUCOSE, CAPILLARY - Abnormal; Notable for the following components:      Result Value   Glucose-Capillary 150 (*)    All other components within normal limits  BASIC METABOLIC PANEL - Abnormal; Notable for the following components:   Potassium 3.1 (*)    Glucose, Bld 110 (*)    All other components within normal limits  CBC - Abnormal; Notable for the following  components:   WBC 2.4 (*)    All other components within normal limits     IMAGES:   EKG: 05/02/2021 Rate 61 bpm  NSR Nonspecific T wave abnormality  CV: Cardiac Cath 04/17/21 No angiographic evidence of CAD LVEDP 15-17 mmHg   Recommendations: No further ischemic testing. Consider coronary vasospasm as the etiology of her chest pain and elevated troponin  Echo 04/15/2021  1. Left ventricular ejection fraction, by estimation, is 65 to 70%. The  left ventricle has normal function. The left ventricle has no regional  wall motion abnormalities. Left ventricular diastolic parameters were  normal.   2. Right ventricular systolic function is normal. The right ventricular  size is normal. There is normal pulmonary artery systolic pressure.   3. The mitral valve is normal in structure. Trivial mitral valve  regurgitation. No evidence of mitral stenosis.   4. The aortic valve is tricuspid. Aortic valve regurgitation is not  visualized. No aortic stenosis is present.   5. The inferior vena cava is normal in size with greater than 50%  respiratory variability, suggesting right atrial pressure of 3 mmHg. Past Medical History:  Diagnosis Date   Anxiety    HX OF ANXIETY ATTACKS   Back pain, chronic    Bronchitis    Cholelithiasis 04/15/2021   Diabetes ([place])    started Metformin in 02/2021   Fibromyalgia    GERD (gastroesophageal reflux disease)    History of kidney stones    Hypertension  Migraines    HX OF MIGRAINES   Myocardial infarction [facility])    july 2022   Obesity    Pneumonia    06-2021   Shortened PR interval     Past Surgical History:  Procedure Laterality Date   CONSTIPATION     CYSTOSCOPY W/ URETERAL STENT PLACEMENT  09/23/2012   Procedure: CYSTOSCOPY WITH RETROGRADE PYELOGRAM/URETERAL STENT PLACEMENT;  Surgeon: Alexis Frock, MD;  Location: [facility];  Service: Urology;  Laterality: Right;   CYSTOSCOPY WITH RETROGRADE PYELOGRAM, URETEROSCOPY AND STENT PLACEMENT   10/08/2012   Procedure: CYSTOSCOPY WITH RETROGRADE PYELOGRAM, URETEROSCOPY AND STENT PLACEMENT;  Surgeon: Alexis Frock, MD;  Location: [facility];  Service: Urology;  Laterality: Right;  90 MIN NO RETROGRADE NEEDS FLEX URETEROSCOPE    HOLMIUM LASER APPLICATION  0/35/5974   Procedure: HOLMIUM LASER APPLICATION;  Surgeon: Alexis Frock, MD;  Location: [facility];  Service: Urology;  Laterality: Right;   LEFT HEART CATH AND CORONARY ANGIOGRAPHY N/A 04/17/2021   Procedure: LEFT HEART CATH AND CORONARY ANGIOGRAPHY;  Surgeon: Burnell Blanks, MD;  Location: [place] CV LAB;  Service: Cardiovascular;  Laterality: N/A;   LEFT HEART CATHETERIZATION WITH CORONARY ANGIOGRAM N/A 10/29/2011   Procedure: LEFT HEART CATHETERIZATION WITH CORONARY ANGIOGRAM;  Surgeon: Leonie Man, MD;  Location: [facility] CATH LAB;  Service: Cardiovascular;  Laterality: N/A;   ROBOTIC ASSISTED LAP VAGINAL HYSTERECTOMY  09/14/2011   Procedure: ROBOTIC ASSISTED LAPAROSCOPIC VAGINAL HYSTERECTOMY;  Surgeon: Agnes Lawrence, MD;  Location: [facility];  Service: Gynecology;  Laterality: N/A;   TUBAL LIGATION      MEDICATIONS:  acetaminophen (TYLENOL) 500 MG tablet   albuterol (VENTOLIN HFA) 108 (90 Base) MCG/ACT inhaler   amLODipine (NORVASC) 5 MG tablet   aspirin EC 81 MG EC tablet   atorvastatin (LIPITOR) 10 MG tablet   Blood Glucose Monitoring Suppl (ONETOUCH VERIO) w/Device KIT   clopidogrel (PLAVIX) 75 MG tablet   glucose blood (ONETOUCH VERIO) test strip   hydrOXYzine (VISTARIL) 25 MG capsule   levocetirizine (XYZAL) 5 MG tablet   metFORMIN (GLUCOPHAGE) 500 MG tablet   metoprolol tartrate (LOPRESSOR) 25 MG tablet   Multiple Vitamin (MULTIVITAMIN WITH MINERALS) TABS tablet   OneTouch Delica Lancets 16L MISC   pregabalin (LYRICA) 150 MG capsule   No current facility-administered medications for this encounter.    Konrad Felix Ward, PA-C [facility] [phone]

## 2021-08-21 ENCOUNTER — Ambulatory Visit (HOSPITAL_COMMUNITY): Payer: BC Managed Care – PPO | Admitting: Physician Assistant

## 2021-08-21 ENCOUNTER — Ambulatory Visit (HOSPITAL_COMMUNITY)
Admission: RE | Admit: 2021-08-21 | Discharge: 2021-08-21 | Disposition: A | Discharge status: Home / Self Care | Payer: BC Managed Care – PPO | Attending: Urology | Admitting: Urology

## 2021-08-21 ENCOUNTER — Ambulatory Visit (HOSPITAL_COMMUNITY): Payer: BC Managed Care – PPO

## 2021-08-21 ENCOUNTER — Ambulatory Visit (HOSPITAL_COMMUNITY): Payer: BC Managed Care – PPO | Admitting: Anesthesiology

## 2021-08-21 ENCOUNTER — Encounter (HOSPITAL_COMMUNITY): Payer: Self-pay | Admitting: Urology

## 2021-08-21 ENCOUNTER — Encounter (HOSPITAL_COMMUNITY)
Admission: RE | Disposition: A | Discharge status: Home / Self Care | Payer: Self-pay | Source: Home / Self Care | Attending: Urology

## 2021-08-21 DIAGNOSIS — N132 Hydronephrosis with renal and ureteral calculous obstruction: Secondary | ICD-10-CM | POA: Diagnosis present

## 2021-08-21 DIAGNOSIS — Z7984 Long term (current) use of oral hypoglycemic drugs: Secondary | ICD-10-CM | POA: Diagnosis not present

## 2021-08-21 DIAGNOSIS — I252 Old myocardial infarction: Secondary | ICD-10-CM | POA: Insufficient documentation

## 2021-08-21 DIAGNOSIS — Z87891 Personal history of nicotine dependence: Secondary | ICD-10-CM | POA: Insufficient documentation

## 2021-08-21 DIAGNOSIS — K219 Gastro-esophageal reflux disease without esophagitis: Secondary | ICD-10-CM | POA: Diagnosis not present

## 2021-08-21 DIAGNOSIS — I2510 Atherosclerotic heart disease of native coronary artery without angina pectoris: Secondary | ICD-10-CM | POA: Diagnosis not present

## 2021-08-21 DIAGNOSIS — I10 Essential (primary) hypertension: Secondary | ICD-10-CM | POA: Insufficient documentation

## 2021-08-21 DIAGNOSIS — M797 Fibromyalgia: Secondary | ICD-10-CM | POA: Diagnosis not present

## 2021-08-21 DIAGNOSIS — F419 Anxiety disorder, unspecified: Secondary | ICD-10-CM | POA: Diagnosis not present

## 2021-08-21 DIAGNOSIS — R519 Headache, unspecified: Secondary | ICD-10-CM | POA: Insufficient documentation

## 2021-08-21 DIAGNOSIS — Z7982 Long term (current) use of aspirin: Secondary | ICD-10-CM | POA: Diagnosis not present

## 2021-08-21 DIAGNOSIS — Z7902 Long term (current) use of antithrombotics/antiplatelets: Secondary | ICD-10-CM | POA: Diagnosis not present

## 2021-08-21 DIAGNOSIS — E119 Type 2 diabetes mellitus without complications: Secondary | ICD-10-CM | POA: Insufficient documentation

## 2021-08-21 HISTORY — PX: CYSTOSCOPY/URETEROSCOPY/HOLMIUM LASER/STENT PLACEMENT: SHX6546

## 2021-08-21 LAB — GLUCOSE, CAPILLARY
Glucose-Capillary: 100 mg/dL — ABNORMAL HIGH (ref 70–99)
Glucose-Capillary: 106 mg/dL — ABNORMAL HIGH (ref 70–99)

## 2021-08-21 SURGERY — CYSTOSCOPY/URETEROSCOPY/HOLMIUM LASER/STENT PLACEMENT
Anesthesia: General | Site: Ureter | Laterality: Right

## 2021-08-21 MED ORDER — MIDAZOLAM HCL 2 MG/2ML IJ SOLN
INTRAMUSCULAR | Status: AC
  Filled 2021-08-21: qty 2

## 2021-08-21 MED ORDER — DEXAMETHASONE SODIUM PHOSPHATE 4 MG/ML IJ SOLN
INTRAMUSCULAR | Status: DC | PRN
  Administered 2021-08-21: 8 mg via INTRAVENOUS

## 2021-08-21 MED ORDER — CEFAZOLIN SODIUM-DEXTROSE 2-4 GM/100ML-% IV SOLN
2.0000 g | INTRAVENOUS | Status: AC
  Administered 2021-08-21: 2 g via INTRAVENOUS
  Filled 2021-08-21: qty 100

## 2021-08-21 MED ORDER — FENTANYL CITRATE (PF) 100 MCG/2ML IJ SOLN
INTRAMUSCULAR | Status: DC | PRN
  Administered 2021-08-21: 25 ug via INTRAVENOUS

## 2021-08-21 MED ORDER — LIDOCAINE HCL (PF) 2 % IJ SOLN
INTRAMUSCULAR | Status: AC
  Filled 2021-08-21: qty 5

## 2021-08-21 MED ORDER — ONDANSETRON HCL 4 MG/2ML IJ SOLN
INTRAMUSCULAR | Status: AC
  Filled 2021-08-21: qty 2

## 2021-08-21 MED ORDER — PROPOFOL 10 MG/ML IV BOLUS
INTRAVENOUS | Status: AC
  Filled 2021-08-21: qty 20

## 2021-08-21 MED ORDER — IOHEXOL 300 MG/ML  SOLN
INTRAMUSCULAR | Status: DC | PRN
  Administered 2021-08-21: 10 mL

## 2021-08-21 MED ORDER — CHLORHEXIDINE GLUCONATE 0.12 % MT SOLN
15.0000 mL | Freq: Once | OROMUCOSAL | Status: AC
  Administered 2021-08-21: 15 mL via OROMUCOSAL

## 2021-08-21 MED ORDER — FENTANYL CITRATE (PF) 100 MCG/2ML IJ SOLN
INTRAMUSCULAR | Status: AC
  Filled 2021-08-21: qty 2

## 2021-08-21 MED ORDER — ACETAMINOPHEN 10 MG/ML IV SOLN: 1000.0000 mg | Freq: Once | INTRAVENOUS | Status: DC | PRN

## 2021-08-21 MED ORDER — PROMETHAZINE HCL 25 MG/ML IJ SOLN: 6.2500 mg | INTRAMUSCULAR | Status: DC | PRN

## 2021-08-21 MED ORDER — MIDAZOLAM HCL 5 MG/5ML IJ SOLN
INTRAMUSCULAR | Status: DC | PRN
  Administered 2021-08-21 (×2): 1 mg via INTRAVENOUS

## 2021-08-21 MED ORDER — ORAL CARE MOUTH RINSE: 15.0000 mL | Freq: Once | OROMUCOSAL | Status: AC

## 2021-08-21 MED ORDER — LIDOCAINE HCL (CARDIAC) PF 100 MG/5ML IV SOSY
PREFILLED_SYRINGE | INTRAVENOUS | Status: DC | PRN
  Administered 2021-08-21: 60 mg via INTRAVENOUS

## 2021-08-21 MED ORDER — ONDANSETRON HCL 4 MG/2ML IJ SOLN
INTRAMUSCULAR | Status: DC | PRN
  Administered 2021-08-21: 4 mg via INTRAVENOUS

## 2021-08-21 MED ORDER — PROPOFOL 10 MG/ML IV BOLUS
INTRAVENOUS | Status: DC | PRN
  Administered 2021-08-21: 180 mg via INTRAVENOUS

## 2021-08-21 MED ORDER — FENTANYL CITRATE PF 50 MCG/ML IJ SOSY: 25.0000 ug | PREFILLED_SYRINGE | INTRAMUSCULAR | Status: DC | PRN

## 2021-08-21 MED ORDER — DEXAMETHASONE SODIUM PHOSPHATE 10 MG/ML IJ SOLN
INTRAMUSCULAR | Status: AC
  Filled 2021-08-21: qty 1

## 2021-08-21 MED ORDER — SODIUM CHLORIDE 0.9 % IR SOLN
Status: DC | PRN
  Administered 2021-08-21: 3000 mL

## 2021-08-21 MED ORDER — LACTATED RINGERS IV SOLN: INTRAVENOUS | Status: DC

## 2021-08-21 SURGICAL SUPPLY — 23 items
BAG URO CATCHER STRL LF (MISCELLANEOUS) ×2 IMPLANT
BASKET LASER NITINOL 1.9FR (BASKET) IMPLANT
BASKET ZERO TIP NITINOL 2.4FR (BASKET) IMPLANT
BSKT STON RTRVL 120 1.9FR (BASKET)
BSKT STON RTRVL ZERO TP 2.4FR (BASKET)
CATH URETL OPEN END 6FR 70 (CATHETERS) ×2 IMPLANT
CLOTH BEACON ORANGE TIMEOUT ST (SAFETY) ×2 IMPLANT
EXTRACTOR STONE 1.7FRX115CM (UROLOGICAL SUPPLIES) IMPLANT
GLOVE SURG ENC MOIS LTX SZ7.5 (GLOVE) ×2 IMPLANT
GOWN STRL REUS W/TWL XL LVL3 (GOWN DISPOSABLE) ×2 IMPLANT
GUIDEWIRE ANG ZIPWIRE 038X150 (WIRE) IMPLANT
GUIDEWIRE STR DUAL SENSOR (WIRE) ×3 IMPLANT
KIT TURNOVER KIT A (KITS) IMPLANT
LASER FIB FLEXIVA PULSE ID 365 (Laser) IMPLANT
MANIFOLD NEPTUNE II (INSTRUMENTS) ×2 IMPLANT
PACK CYSTO (CUSTOM PROCEDURE TRAY) ×2 IMPLANT
SHEATH NAVIGATOR HD 11/13X28 (SHEATH) IMPLANT
SHEATH NAVIGATOR HD 11/13X36 (SHEATH) ×1 IMPLANT
STENT URET 6FRX24 CONTOUR (STENTS) ×1 IMPLANT
TRACTIP FLEXIVA PULS ID 200XHI (Laser) IMPLANT
TRACTIP FLEXIVA PULSE ID 200 (Laser) ×2
TUBING CONNECTING 10 (TUBING) ×2 IMPLANT
TUBING UROLOGY SET (TUBING) ×2 IMPLANT

## 2021-08-21 NOTE — Op Note (Signed)
Operative Note  Preoperative diagnosis:  1.  Right ureteral calculus  Postoperative diagnosis: 1.  Right ureteral calculus  Procedure(s): 1.  Cystoscopy with right retrograde pyelogram, right ureteroscopy with laser lithotripsy, ureteral stent placement  Surgeon: Link Snuffer, MD  Assistants: None  Anesthesia: General  Complications: None immediate  EBL: Minimal  Specimens: 1.  None  Drains/Catheters: 1.  6 x 24 double-J ureteral stent  Intraoperative findings: 1.  Normal urethra and bladder 2.  Proximal submillimeter ureteral calculus dusted to tiny fragments.  Retrograde pyelogram revealed hydronephrosis.  There was no filling defect after treatment of stone.  Indication: 60 year old female with a right ureteral calculus presents for the previously mentioned operation.  Description of procedure:  The patient was identified and consent was obtained.  The patient was taken to the operating room and placed in the supine position.  The patient was placed under general anesthesia.  Perioperative antibiotics were administered.  The patient was placed in dorsal lithotomy.  Patient was prepped and draped in a standard sterile fashion and a timeout was performed.  A 21 French rigid cystoscope was advanced into the urethra and into the bladder.  Complete cystoscopy was performed with no abnormal findings.  The right ureter was cannulated with a sensor wire which was advanced up to the kidney under fluoroscopic guidance.  A semirigid ureteroscope was advanced alongside the wire.  No stones were seen.  I was able to advance to the proximal ureter.  A second wire was advanced through the scope and into the kidney and the scope was withdrawn.  A 12 x 14 ureteral access sheath was advanced over the wire under continuous fluoroscopic guidance into the proximal ureter.  Inner sheath and wire were withdrawn.  Digital ureteroscopy was performed.  The proximal ureteral calculus had retropulsed into  the kidney.  On dust settings, I dusted the stone to tiny fragments.  No other calculi were seen.  I shot a retrograde pyelogram through the scope with findings noted above.  I withdrew the scope along with the access sheath visualizing the entire ureter upon removal.  There was some ureteral edema at the ureteropelvic junction at the level of stone impaction but no obvious injury or ureteral calculi.  I backloaded the wire onto rigid cystoscope and advanced that into the bladder followed by routine placement of a 6 x 24 double-J ureteral stent.  Fluoroscopy confirmed proximal placement and direct visualization confirmed a good coil within the bladder.  I drained the bladder and withdrew the scope.  Patient tolerated the procedure well and was stable postoperative.  Plan: Follow-up in 5 to 7 days for stent removal.

## 2021-08-21 NOTE — Transfer of Care (Signed)
Immediate Anesthesia Transfer of Care Note  Patient: Lisa Casey  Procedure(s) Performed: Procedure(s): CYSTOSCOPY RIGHT /URETEROSCOPY/HOLMIUM LASER/STENT PLACEMENT (Right)  Patient Location: PACU  Anesthesia Type:General  Level of Consciousness: Patient easily awoken, sedated, comfortable, cooperative, following commands, responds to stimulation.   Airway & Oxygen Therapy: Patient spontaneously breathing, ventilating well, oxygen via simple oxygen mask.  Post-op Assessment: Report given to PACU RN, vital signs reviewed and stable, moving all extremities.   Post vital signs: Reviewed and stable.  Complications: No apparent anesthesia complications Last Vitals:  Vitals Value Taken Time  BP    Temp    Pulse 59 08/21/21 0831  Resp 13 08/21/21 0831  SpO2 100 % 08/21/21 0831  Vitals shown include unvalidated device data.  Last Pain:  Vitals:   08/21/21 0537  TempSrc:   PainSc: 0-No pain         Complications: No notable events documented.

## 2021-08-21 NOTE — Interval H&P Note (Signed)
History and Physical Interval Note:  08/21/2021 7:35 AM  Lisa Casey  has presented today for surgery, with the diagnosis of RIGHT URETERAL STONE.  The various methods of treatment have been discussed with the patient and family. After consideration of risks, benefits and other options for treatment, the patient has consented to  Procedure(s): CYSTOSCOPY RIGHT /URETEROSCOPY/HOLMIUM LASER/STENT PLACEMENT (Right) as a surgical intervention.  The patient's history has been reviewed, patient examined, no change in status, stable for surgery.  I have reviewed the patient's chart and labs.  Questions were answered to the patient's satisfaction.     Marton Redwood, III

## 2021-08-21 NOTE — H&P (Signed)
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 She has not had follow-up imaging. This imaging was performed in July. She denies any pain or discomfort, hematuria or dysuria. She denies recurrent urinary tract infections. She has required ureteroscopy in the past. She first had an urgent stent placed followed by ureteroscopy. She states she had a MI back in July and is on aspirin and Plavix. She underwent cardiac catheterization that did not show any obstructive coronary artery disease.     ALLERGIES: Codeine    MEDICATIONS: Aspirin 81 mg tablet,chewable  Metformin Hcl 500 mg tablet  Metoprolol Tartrate 25 mg tablet  Amlodipine Besylate 5 mg tablet  Atorvastatin Calcium 10 mg tablet  Clopidogrel 75 mg tablet  Hydroxyzine Hcl  Lyrica  Xyzal 5 mg tablet     GU PSH: None   NON-GU PSH: Hysterectomy     GU PMH: None   NON-GU PMH: Anxiety Arthritis Diabetes Type 2 Hypercholesterolemia Hypertension Myocardial Infarction    FAMILY HISTORY: 1 Daughter - Other 1 son - Other Diabetes - Runs in Family fibromyalgia - Runs in Family Kidney Failure - Father Myocardial Infarction - Runs in Family   SOCIAL HISTORY: Marital Status: Married Preferred Language: English; Race: Black or African American Current Smoking Status: Patient does not smoke anymore. Has not smoked since 07/19/1991.   Tobacco Use Assessment Completed: Used Tobacco in last 30 days? Drinks 1 caffeinated drink per day.    REVIEW OF SYSTEMS:    GU Review Female:   Patient denies frequent urination, hard to postpone urination, burning /pain with urination, get up at night to urinate, leakage of urine, stream starts and stops, trouble starting your stream, have to  strain to urinate, and being pregnant.  Gastrointestinal (Upper):   Patient denies nausea, vomiting, and indigestion/ heartburn.  Gastrointestinal (Lower):   Patient denies diarrhea and constipation.  Constitutional:   Patient denies fever, night sweats, weight loss, and fatigue.  Skin:   Patient denies skin rash/ lesion and itching.  Eyes:   Patient denies blurred vision and double vision.  Ears/ Nose/ Throat:   Patient denies sore throat and sinus problems.  Hematologic/Lymphatic:   Patient denies swollen glands and easy bruising.  Cardiovascular:   Patient denies leg swelling and chest pains.  Respiratory:   Patient reports cough and shortness of breath.   Endocrine:   Patient denies excessive thirst.  Musculoskeletal:   Patient reports joint pain. Patient denies back pain.  Neurological:   Patient denies headaches and dizziness.  Psychologic:   Patient reports anxiety. Patient denies depression.   VITAL SIGNS:      07/27/2021 09:17 AM  Weight 197 lb / 89.36 kg  Height 65 in / 165.1 cm  BP 130/83 mmHg  Heart Rate 59 /min  Temperature 97.3 F / 36.2 C  BMI 32.8 kg/m   MULTI-SYSTEM PHYSICAL EXAMINATION:    Constitutional: Well-nourished. No physical deformities. Normally developed. Good grooming.  Respiratory: No labored breathing, no use of accessory muscles.   Cardiovascular: Normal temperature, normal extremity pulses, no swelling, no varicosities.  Skin: No paleness, no jaundice, no cyanosis. No lesion, no ulcer, no rash.  Neurologic / Psychiatric: Oriented to time, oriented to place, oriented to person. No depression, no anxiety, no agitation.  Gastrointestinal: No mass, no  tenderness, no rigidity, non obese abdomen.  Eyes: Normal conjunctivae. Normal eyelids.  Musculoskeletal: Normal gait and station of head and neck.     Complexity of Data:  Source Of History:  Patient  Records Review:   Previous Doctor Records, Previous Patient Records  Urine Test Review:   Urinalysis   X-Ray Review: C.T. Chest: Reviewed Films. Reviewed Report. Discussed With Patient.     PROCEDURES:         C.T. Urogram - [id number]  Right ureteropelvic junction calculus with hydronephrosis. Measures about 7 to 8 mm.      Patient confirmed No Neulasta OnPro Device.         Urinalysis w/Scope - 81001 Dipstick Dipstick Cont'd Micro  Color: Yellow Bilirubin: Neg WBC/hpf: 10 - 20/hpf  Appearance: Clear Ketones: Neg RBC/hpf: NS (Not Seen)  Specific Gravity: 1.020 Blood: Neg Bacteria: NS (Not Seen)  pH: 6.5 Protein: Trace Cystals: NS (Not Seen)  Glucose: Neg Urobilinogen: 0.2 Casts: NS (Not Seen)    Nitrites: Neg Trichomonas: Not Present    Leukocyte Esterase: 1+ Mucous: Present      Epithelial Cells: 0 - 5/hpf      Yeast: NS (Not Seen)      Sperm: Not Present    Notes:      ASSESSMENT:      ICD-10 Details  1 GU:   Renal and ureteral calculus - N20.2 Undiagnosed New Problem  2   Ureteral obstruction secondary to calculous - N13.2 Undiagnosed New Problem   PLAN:           Orders X-Rays: C.T. Stone Protocol Without I.V. Contrast  X-Ray Notes: History:  Hematuria: Yes/No  Patient to see MD after exam: Yes/No  Previous exam: CT / IVP/ US/ KUB/ None  When:  Where:  Diabetic: Yes/ No  BUN/ Creatinine:  Date of last BUN Creatinine:  Weight in pounds:  Allergy- IV Contrast: Yes/ No  Conflicting diabetic meds: Yes/ No  Diabetic Meds:  Prior Authorization #Luberta [id number] #[id number] Valid 07/27/21 thru 08/25/21            Schedule         Document Letter(s):  Created for Patient: Clinical Summary         Notes:   We discussed the management of urinary stones. These options include observation, ureteroscopy, and shockwave lithotripsy. We discussed which options are relevant to these particular stones. We discussed the natural history of stones as well as the complications of untreated stones and the impact on quality of life without treatment as well as  with each of the above listed treatments. We also discussed the efficacy of each treatment in its ability to clear the stone burden. With any of these management options I discussed the signs and symptoms of infection and the need for emergent treatment should these be experienced. For each option we discussed the ability of each procedure to clear the patient of their stone burden.   For observation I described the risks which include but are not limited to silent renal damage, life-threatening infection, need for emergent surgery, failure to pass stone, and pain.   For ureteroscopy I described the risks which include heart attack, stroke, pulmonary embolus, death, bleeding, infection, damage to contiguous structures, positioning injury, ureteral stricture, ureteral avulsion, ureteral injury, need for ureteral stent, inability to perform ureteroscopy, need for an interval procedure, inability to clear stone burden, stent discomfort and pain.   For shockwave lithotripsy I described the risks which include  arrhythmia, kidney contusion, kidney hemorrhage, need for transfusion, pain, inability to break up stone, inability to pass stone fragments, Steinstrasse, infection associated with obstructing stones, need for different surgical procedure, need for repeat shockwave lithotripsy.    She would like to proceed with right ureteroscopy with laser lithotripsy with ureteral stent placement   Obtain 24-hour urinalysis   CC: Dr. Baird Cancer    Signed by Link Snuffer, III, M.D. on 07/27/21 at 11:16 AM (EST

## 2021-08-21 NOTE — Anesthesia Postprocedure Evaluation (Signed)
Anesthesia Post Note  Patient: Fara Worthy Propst  Procedure(s) Performed: CYSTOSCOPY RIGHT /URETEROSCOPY/HOLMIUM LASER/STENT PLACEMENT (Right: Ureter)     Patient location during evaluation: PACU Anesthesia Type: General Level of consciousness: awake and alert Pain management: pain level controlled Vital Signs Assessment: post-procedure vital signs reviewed and stable Respiratory status: spontaneous breathing, nonlabored ventilation, respiratory function stable and patient connected to nasal cannula oxygen Cardiovascular status: blood pressure returned to baseline and stable Postop Assessment: no apparent nausea or vomiting Anesthetic complications: no   No notable events documented.  Last Vitals:  Vitals:   08/21/21 0845 08/21/21 0900  BP: (!) 142/86 (!) 146/86  Pulse: (!) 59 (!) 57  Resp: 15 12  Temp:  36.6 C  SpO2: 93% 96%    Last Pain:  Vitals:   08/21/21 0900  TempSrc:   PainSc: 3                  Kyrollos Cordell P Jayr Lupercio

## 2021-08-21 NOTE — Anesthesia Procedure Notes (Addendum)
Procedure Name: LMA Insertion Date/Time: 08/21/2021 7:50 AM Performed by: Deliah Boston, CRNA Pre-anesthesia Checklist: Patient identified, Emergency Drugs available, Suction available and Patient being monitored Patient Re-evaluated:Patient Re-evaluated prior to induction Oxygen Delivery Method: Circle system utilized Preoxygenation: Pre-oxygenation with 100% oxygen Induction Type: IV induction Ventilation: Mask ventilation without difficulty LMA: LMA flexible inserted LMA Size: 4.0 Number of attempts: 1 Placement Confirmation: positive ETCO2 and breath sounds checked- equal and bilateral Tube secured with: Tape Dental Injury: Teeth and Oropharynx as per pre-operative assessment

## 2021-08-21 NOTE — Discharge Instructions (Signed)

## 2021-08-22 ENCOUNTER — Encounter (HOSPITAL_COMMUNITY): Payer: Self-pay | Admitting: Urology

## 2021-09-04 ENCOUNTER — Other Ambulatory Visit: Payer: Self-pay | Admitting: Nurse Practitioner

## 2021-09-06 ENCOUNTER — Other Ambulatory Visit: Payer: Self-pay

## 2021-09-06 MED ORDER — HYDROXYZINE PAMOATE 25 MG PO CAPS: 25.0000 mg | ORAL_CAPSULE | Freq: Three times a day (TID) | ORAL | 1 refills | Status: DC | PRN

## 2021-09-06 MED ORDER — PREGABALIN 150 MG PO CAPS: 150.0000 mg | ORAL_CAPSULE | Freq: Two times a day (BID) | ORAL | 1 refills | Status: DC

## 2021-10-19 ENCOUNTER — Other Ambulatory Visit: Payer: Self-pay

## 2021-10-19 MED ORDER — CLOPIDOGREL BISULFATE 75 MG PO TABS: 75.0000 mg | ORAL_TABLET | Freq: Every day | ORAL | 3 refills | Status: DC

## 2021-10-26 ENCOUNTER — Other Ambulatory Visit: Payer: Self-pay | Admitting: Nurse Practitioner

## 2021-10-26 DIAGNOSIS — I10 Essential (primary) hypertension: Secondary | ICD-10-CM

## 2021-10-26 DIAGNOSIS — I252 Old myocardial infarction: Secondary | ICD-10-CM

## 2021-10-31 ENCOUNTER — Encounter: Payer: BC Managed Care – PPO | Admitting: Nurse Practitioner

## 2021-12-08 ENCOUNTER — Other Ambulatory Visit: Payer: Self-pay | Admitting: Nurse Practitioner

## 2021-12-08 DIAGNOSIS — R0982 Postnasal drip: Secondary | ICD-10-CM

## 2021-12-14 ENCOUNTER — Other Ambulatory Visit: Payer: Self-pay | Admitting: Nurse Practitioner

## 2021-12-14 DIAGNOSIS — R0982 Postnasal drip: Secondary | ICD-10-CM

## 2021-12-19 ENCOUNTER — Other Ambulatory Visit: Payer: Self-pay

## 2021-12-19 DIAGNOSIS — I7090 Unspecified atherosclerosis: Secondary | ICD-10-CM

## 2021-12-19 DIAGNOSIS — I252 Old myocardial infarction: Secondary | ICD-10-CM

## 2021-12-19 DIAGNOSIS — R0982 Postnasal drip: Secondary | ICD-10-CM

## 2021-12-19 DIAGNOSIS — I10 Essential (primary) hypertension: Secondary | ICD-10-CM

## 2021-12-19 MED ORDER — METOPROLOL TARTRATE 25 MG PO TABS: ORAL_TABLET | ORAL | 0 refills | Status: DC

## 2021-12-19 MED ORDER — ATORVASTATIN CALCIUM 10 MG PO TABS: ORAL_TABLET | ORAL | 1 refills | Status: DC

## 2021-12-19 MED ORDER — LEVOCETIRIZINE DIHYDROCHLORIDE 5 MG PO TABS: 5.0000 mg | ORAL_TABLET | Freq: Every evening | ORAL | 0 refills | Status: DC

## 2021-12-19 MED ORDER — HYDROXYZINE PAMOATE 25 MG PO CAPS: 25.0000 mg | ORAL_CAPSULE | Freq: Three times a day (TID) | ORAL | 1 refills | Status: DC | PRN

## 2021-12-19 MED ORDER — AMLODIPINE BESYLATE 5 MG PO TABS: 5.0000 mg | ORAL_TABLET | Freq: Every day | ORAL | 3 refills | Status: DC

## 2021-12-26 ENCOUNTER — Ambulatory Visit: Payer: BC Managed Care – PPO | Admitting: Nurse Practitioner

## 2021-12-26 ENCOUNTER — Encounter: Payer: Self-pay | Admitting: Nurse Practitioner

## 2021-12-26 VITALS — BP 112/78 | HR 69 | Temp 98.9°F | Ht 65.0 in | Wt 193.0 lb

## 2021-12-26 DIAGNOSIS — I10 Essential (primary) hypertension: Secondary | ICD-10-CM | POA: Diagnosis not present

## 2021-12-26 DIAGNOSIS — I7090 Unspecified atherosclerosis: Secondary | ICD-10-CM | POA: Diagnosis not present

## 2021-12-26 DIAGNOSIS — T148XXA Other injury of unspecified body region, initial encounter: Secondary | ICD-10-CM

## 2021-12-26 DIAGNOSIS — F419 Anxiety disorder, unspecified: Secondary | ICD-10-CM

## 2021-12-26 DIAGNOSIS — R7303 Prediabetes: Secondary | ICD-10-CM | POA: Diagnosis not present

## 2021-12-26 NOTE — Progress Notes (Signed)
?Industrial/product designer as a Education administrator for [employer], FNP.,have documented all relevant documentation on the behalf of Lisa Brine, FNP,as directed by  Lisa Brine, FNP while in the presence of Lisa Casey, [place]. ? ?This visit occurred during the SARS-CoV-2 public health emergency.  Safety protocols were in place, including screening questions prior to the visit, additional usage of staff PPE, and extensive cleaning of exam room while observing appropriate contact time as indicated for disinfecting solutions. ? ?Subjective:  ?  ? Patient ID: Lisa Casey , female    DOB: March 29, 1961 , 61 y.o.   MRN: [id number] ? ? ?Chief Complaint  ?Patient presents with  ? Hypertension  ? ? ?HPI ? ?Patient presents for HTN and prediabetes follow up. She continues to be followed by Dr. Radford Casey for her Cardiac needs. She has had a back procedure 2 weeks ago with Dr Lisa Casey -  She continues to be out of work. She had an injection to her back and feels like it is working so far. She had a cystoscopy done in December. ? ?She is concerned about her anxiety. With her son who has down's syndrome as he ages ? ?Hypertension ?This is a chronic problem. The current episode started more than 1 year ago. The problem is controlled. Pertinent negatives include no anxiety. There are no associated agents to hypertension. Risk factors for coronary artery disease include obesity and sedentary lifestyle. Past treatments include diuretics. There are no compliance problems.  There is no history of angina or kidney disease. There is no history of chronic renal disease.   ? ?Past Medical History:  ?Diagnosis Date  ? Anxiety   ? HX OF ANXIETY ATTACKS  ? Back pain, chronic   ? Bronchitis   ? Cholelithiasis 04/15/2021  ? Diabetes ([place])   ? started Metformin in 02/2021  ? Fibromyalgia   ? GERD (gastroesophageal reflux disease)   ? History of kidney stones   ? Hypertension   ? Migraines   ? HX OF MIGRAINES  ? Myocardial infarction [facility])   ? july 2022  ?  Obesity   ? Pneumonia   ? 06-2021  ? Shortened PR interval   ?  ? ?Family History  ?Problem Relation Age of Onset  ? Breast cancer Mother 62  ? Breast cancer Paternal Grandmother   ? Heart failure Neg Hx   ? Coronary artery disease Neg Hx   ? ? ? ?Current Outpatient Medications:  ?  acetaminophen (TYLENOL) 500 MG tablet, Take 1,000 mg by mouth every 6 (six) hours as needed for moderate pain or headache., Disp: , Rfl:  ?  albuterol (VENTOLIN HFA) 108 (90 Base) MCG/ACT inhaler, Inhale 2 puffs into the lungs every 6 (six) hours as needed for wheezing or shortness of breath., Disp: , Rfl:  ?  amLODipine (NORVASC) 5 MG tablet, Take 1 tablet (5 mg total) by mouth daily., Disp: 90 tablet, Rfl: 3 ?  aspirin EC 81 MG EC tablet, Take 1 tablet (81 mg total) by mouth daily. Swallow whole., Disp: 30 tablet, Rfl: 11 ?  atorvastatin (LIPITOR) 10 MG tablet, Take 1 tab by mouth MWF, Disp: 45 tablet, Rfl: 1 ?  Blood Glucose Monitoring Suppl (ONETOUCH VERIO) w/Device KIT, Use as directed to check blood sugars 2 times per day dx: e11.65, Disp: 1 kit, Rfl: 1 ?  clopidogrel (PLAVIX) 75 MG tablet, Take 1 tablet (75 mg total) by mouth daily., Disp: 30 tablet, Rfl: 3 ?  glucose blood (ONETOUCH VERIO) test strip, Use  as directed to check blood sugars 2 times per day dx: e11.65, Disp: 100 each, Rfl: 5 ?  hydrOXYzine (VISTARIL) 25 MG capsule, Take 1 capsule (25 mg total) by mouth 3 (three) times daily as needed for anxiety., Disp: 90 capsule, Rfl: 1 ?  levocetirizine (XYZAL) 5 MG tablet, Take 1 tablet (5 mg total) by mouth every evening., Disp: 90 tablet, Rfl: 0 ?  metoprolol tartrate (LOPRESSOR) 25 MG tablet, Take 1/2 (one-half) tablet by mouth twice daily, Disp: 60 tablet, Rfl: 0 ?  Multiple Vitamin (MULTIVITAMIN WITH MINERALS) TABS tablet, Take 1 tablet by mouth daily., Disp: , Rfl:  ?  OneTouch Delica Lancets 74Y MISC, Use as directed to check blood sugars 2 times per day dx: e11.65, Disp: 100 each, Rfl: 5 ?  pregabalin (LYRICA) 150 MG  capsule, Take 1 capsule (150 mg total) by mouth 2 (two) times daily., Disp: 180 capsule, Rfl: 1 ?  metFORMIN (GLUCOPHAGE) 1000 MG tablet, Take 1 tablet (1,000 mg total) by mouth 2 (two) times daily with a meal., Disp: 180 tablet, Rfl: 1  ? ?Allergies  ?Allergen Reactions  ? Codeine Itching  ?  ? ?Review of Systems  ?Constitutional: Negative.   ?Respiratory: Negative.    ?Cardiovascular: Negative.   ?Gastrointestinal: Negative.   ?Neurological: Negative.   ?Psychiatric/Behavioral: Negative.     ? ?Today's Vitals  ? 12/26/21 1119  ?BP: 112/78  ?Pulse: 69  ?Temp: 98.9 ?F (37.2 ?C)  ?Weight: 193 lb (87.5 kg)  ?Height: _0  (1.651 m)  ? ?Body mass index is 32.12 kg/m?.  ? ?Objective:  ?Physical Exam ?Vitals reviewed.  ?Constitutional:   ?   General: She is not in acute distress. ?   Appearance: Normal appearance. She is obese.  ?Cardiovascular:  ?   Rate and Rhythm: Normal rate and regular rhythm.  ?   Pulses: Normal pulses.  ?   Heart sounds: Normal heart sounds. No murmur heard. ?Pulmonary:  ?   Effort: Pulmonary effort is normal. No respiratory distress.  ?   Breath sounds: Normal breath sounds. No wheezing.  ?Neurological:  ?   General: No focal deficit present.  ?   Mental Status: She is alert and oriented to person, place, and time.  ?   Cranial Nerves: No cranial nerve deficit.  ?   Motor: No weakness.  ?Psychiatric:     ?   Behavior: Behavior normal.     ?   Thought Content: Thought content normal.     ?   Cognition and Memory: Cognition normal.     ?   Judgment: Judgment normal.  ?   Comments: She is tearful during this visit  ?  ? ?   ?Assessment And Plan:  ?   ?1. Essential hypertension ?Comments: Blood pressure is well controlled, continue current medications ?- CMP14+EGFR ? ?2. Atherosclerosis ?Comments: Contnue statin, tolerating well ?- Lipid panel ? ?3. Prediabetes ?Comments: Stable, diet controlled, continue healthy diet and encouraged to exercise regularly at least 150 minutes a week. Continue  metformin tolerating well ?- Hemoglobin A1c ?- CMP14+EGFR ? ?4. Anxiety ?Comments: I have encouraged her to seek counseling to help her to adjust to the changes she sees in her son.  ? ?5. Bruising ?Comments: Scattered bruising, she is taking aspirin but will check CBC for platelets.  ?- CBC ?  ? ? ?Patient was given opportunity to ask questions. Patient verbalized understanding of the plan and was able to repeat key elements of the plan. All questions  were answered to their satisfaction.  ?Lisa Brine, FNP  ? ?I, Lisa Brine, FNP, have reviewed all documentation for this visit. The documentation on 12/26/21 for the exam, diagnosis, procedures, and orders are all accurate and complete.  ? ?IF YOU HAVE BEEN REFERRED TO A SPECIALIST, IT MAY TAKE 1-2 WEEKS TO SCHEDULE/PROCESS THE REFERRAL. IF YOU HAVE NOT HEARD FROM US/SPECIALIST IN TWO WEEKS, PLEASE GIVE [place] A CALL AT [phone] X [phone].  ? ?THE PATIENT IS ENCOURAGED TO PRACTICE SOCIAL DISTANCING DUE TO THE COVID-19 PANDEMIC.   ? ?

## 2021-12-26 NOTE — Patient Instructions (Signed)

## 2021-12-27 ENCOUNTER — Other Ambulatory Visit: Payer: Self-pay | Admitting: Nurse Practitioner

## 2021-12-27 DIAGNOSIS — R7303 Prediabetes: Secondary | ICD-10-CM

## 2021-12-27 LAB — CMP14+EGFR
ALT: 10 IU/L (ref 0–32)
AST: 16 IU/L (ref 0–40)
Albumin/Globulin Ratio: 2 (ref 1.2–2.2)
Albumin: 4.5 g/dL (ref 3.8–4.8)
Alkaline Phosphatase: 76 IU/L (ref 44–121)
BUN/Creatinine Ratio: 22 (ref 12–28)
BUN: 19 mg/dL (ref 8–27)
Bilirubin Total: 1 mg/dL (ref 0.0–1.2)
CO2: 26 mmol/L (ref 20–29)
Calcium: 9.9 mg/dL (ref 8.7–10.3)
Chloride: 108 mmol/L — ABNORMAL HIGH (ref 96–106)
Creatinine, Ser: 0.86 mg/dL (ref 0.57–1.00)
Globulin, Total: 2.3 g/dL (ref 1.5–4.5)
Glucose: 95 mg/dL (ref 70–99)
Potassium: 4.2 mmol/L (ref 3.5–5.2)
Sodium: 146 mmol/L — ABNORMAL HIGH (ref 134–144)
Total Protein: 6.8 g/dL (ref 6.0–8.5)
eGFR: 77 mL/min/{1.73_m2} (ref >59)

## 2021-12-27 LAB — LIPID PANEL
Chol/HDL Ratio: 1.9 ratio (ref 0.0–4.4)
Cholesterol, Total: 124 mg/dL (ref 100–199)
HDL: 65 mg/dL (ref >39)
LDL Chol Calc (NIH): 40 mg/dL (ref 0–99)
Triglycerides: 104 mg/dL (ref 0–149)
VLDL Cholesterol Cal: 19 mg/dL (ref 5–40)

## 2021-12-27 LAB — CBC
Hematocrit: 40 % (ref 34.0–46.6)
Hemoglobin: 13.4 g/dL (ref 11.1–15.9)
MCH: 30.2 pg (ref 26.6–33.0)
MCHC: 33.5 g/dL (ref 31.5–35.7)
MCV: 90 fL (ref 79–97)
Platelets: 274 10*3/uL (ref 150–450)
RBC: 4.43 x10E6/uL (ref 3.77–5.28)
RDW: 12.2 % (ref 11.7–15.4)
WBC: 4.1 10*3/uL (ref 3.4–10.8)

## 2021-12-27 LAB — HEMOGLOBIN A1C
Est. average glucose Bld gHb Est-mCnc: 131 mg/dL
Hgb A1c MFr Bld: 6.2 % — ABNORMAL HIGH (ref 4.8–5.6)

## 2021-12-27 MED ORDER — METFORMIN HCL 1000 MG PO TABS: 1000.0000 mg | ORAL_TABLET | Freq: Two times a day (BID) | ORAL | 1 refills | Status: DC

## 2022-01-15 ENCOUNTER — Encounter: Payer: Self-pay | Admitting: Nurse Practitioner

## 2022-01-16 ENCOUNTER — Other Ambulatory Visit: Payer: Self-pay | Admitting: Nurse Practitioner

## 2022-01-16 DIAGNOSIS — I252 Old myocardial infarction: Secondary | ICD-10-CM

## 2022-01-17 ENCOUNTER — Telehealth: Payer: Self-pay | Admitting: Internal Medicine

## 2022-01-17 NOTE — Telephone Encounter (Signed)
Patient is requesting to switch from Dr. Margaretann Loveless to Dr. Radford Pax.  ?

## 2022-01-22 NOTE — Telephone Encounter (Signed)
Dr. Johney Frame, are you okay with the switch? ?

## 2022-02-15 ENCOUNTER — Other Ambulatory Visit: Payer: Self-pay | Admitting: Nurse Practitioner

## 2022-02-28 ENCOUNTER — Other Ambulatory Visit: Payer: Self-pay | Admitting: Internal Medicine

## 2022-02-28 MED ORDER — PREGABALIN 150 MG PO CAPS: 150.0000 mg | ORAL_CAPSULE | Freq: Two times a day (BID) | ORAL | 0 refills | Status: DC

## 2022-03-05 ENCOUNTER — Other Ambulatory Visit: Payer: Self-pay | Admitting: Nurse Practitioner

## 2022-03-08 NOTE — Progress Notes (Deleted)
Cardiology Office Note:    Date:  03/08/2022   ID:  Lisa Casey, DOB 1961-08-18, MRN [id number]  PCP:  [name], [place] [facility] Cardiologist:  Elouise Munroe, MD     Referring MD: [name], FNP   History of Present Illness:    Lisa Casey is a 61 y.o. female with a hx of NSTEMI on 03/2021, obesity, anxiety, pre-DM, fibromyalgia, GERD, HTN, historically short PR interval, former brief tobacco use who presents to clinic for follow-up.  She presented to the emergency department on 04/15/2021 with central chest pain and abdominal pain that woke her from sleep.  Trop 3>903. She underwent chest CT which showed normal aorta, abnormal kidney suggesting nephro calcinosis, and some calcified coronary artery atherosclerosis.  She underwent cardiac catheterization on 04/17/2021 which showed normal coronary anatomy and no plaque.  MRI showed a small area of inferior lateral apical infarct.  It was felt to be likely to be related to small distal vessel occlusion secondary to diabetes and was recommended for 1 year of DAPT.  Last seen in clinic by Coletta Memos, NP for intermittent chest pain. Her amlodipine was increased to '5mg'$  daily.  Past Medical History:  Diagnosis Date   Anxiety    HX OF ANXIETY ATTACKS   Back pain, chronic    Bronchitis    Cholelithiasis 04/15/2021   Diabetes ([place])    started Metformin in 02/2021   Fibromyalgia    GERD (gastroesophageal reflux disease)    History of kidney stones    Hypertension    Migraines    HX OF MIGRAINES   Myocardial infarction [facility])    july 2022   Obesity    Pneumonia    06-2021   Shortened PR interval     Past Surgical History:  Procedure Laterality Date   CONSTIPATION     CYSTOSCOPY W/ URETERAL STENT PLACEMENT  09/23/2012   Procedure: CYSTOSCOPY WITH RETROGRADE PYELOGRAM/URETERAL STENT PLACEMENT;  Surgeon: Alexis Frock, MD;  Location: [facility];  Service: Urology;  Laterality: Right;   CYSTOSCOPY WITH  RETROGRADE PYELOGRAM, URETEROSCOPY AND STENT PLACEMENT  10/08/2012   Procedure: CYSTOSCOPY WITH RETROGRADE PYELOGRAM, URETEROSCOPY AND STENT PLACEMENT;  Surgeon: Alexis Frock, MD;  Location: [facility];  Service: Urology;  Laterality: Right;  90 MIN NO RETROGRADE NEEDS FLEX URETEROSCOPE    CYSTOSCOPY/URETEROSCOPY/HOLMIUM LASER/STENT PLACEMENT Right 08/21/2021   Procedure: CYSTOSCOPY RIGHT /URETEROSCOPY/HOLMIUM LASER/STENT PLACEMENT;  Surgeon: Lucas Mallow, MD;  Location: [facility];  Service: Urology;  Laterality: Right;   HOLMIUM LASER APPLICATION  0/05/2329   Procedure: HOLMIUM LASER APPLICATION;  Surgeon: Alexis Frock, MD;  Location: [facility];  Service: Urology;  Laterality: Right;   LEFT HEART CATH AND CORONARY ANGIOGRAPHY N/A 04/17/2021   Procedure: LEFT HEART CATH AND CORONARY ANGIOGRAPHY;  Surgeon: Burnell Blanks, MD;  Location: [facility];  Service: Cardiovascular;  Laterality: N/A;   LEFT HEART CATHETERIZATION WITH CORONARY ANGIOGRAM N/A 10/29/2011   Procedure: LEFT HEART CATHETERIZATION WITH CORONARY ANGIOGRAM;  Surgeon: Leonie Man, MD;  Location: [facility];  Service: Cardiovascular;  Laterality: N/A;   ROBOTIC ASSISTED LAP VAGINAL HYSTERECTOMY  09/14/2011   Procedure: ROBOTIC ASSISTED LAPAROSCOPIC VAGINAL HYSTERECTOMY;  Surgeon: Agnes Lawrence, MD;  Location: [facility];  Service: Gynecology;  Laterality: N/A;   TUBAL LIGATION      Current Medications: No outpatient medications have been marked as taking for the 03/09/22 encounter (Appointment) with Freada Bergeron, MD.  Allergies:   Codeine   Social History   Socioeconomic History   Marital status: Divorced    Spouse name: Not on file   Number of children: Not on file   Years of education: Not on file   Highest education level: Not on file  Occupational History   Occupation: school bus driver  Tobacco Use   Smoking status: Former    Packs/day: 0.25     Years: 10.00    Total pack years: 2.50    Types: Cigarettes    Quit date: 1997    Years since quitting: 26.4   Smokeless tobacco: Never  Vaping Use   Vaping Use: Never used  Substance and Sexual Activity   Alcohol use: No   Drug use: No   Sexual activity: Yes    Birth control/protection: Other-see comments    Comment: hysterectomy  Other Topics Concern   Not on file  Social History Narrative   Not on file   Social Determinants of Health   Financial Resource Strain: Not on file  Food Insecurity: Not on file  Transportation Needs: Not on file  Physical Activity: Not on file  Stress: Not on file  Social Connections: Not on file     Family History: The patient's ***family history includes Breast cancer in her paternal grandmother; Breast cancer (age of onset: 28) in her mother. There is no history of Heart failure or Coronary artery disease.  ROS:   Please see the history of present illness.    *** All other systems reviewed and are negative.  EKGs/Labs/Other Studies Reviewed:    The following studies were reviewed today: Cardiac catheterization 04/17/2021 No angiographic evidence of CAD LVEDP 15-17 mmHg   Recommendations: No further ischemic testing. Consider coronary vasospasm as the etiology of her chest pain and elevated troponin.    Cardiac MRI 04/18/2021 FINDINGS: LEFT VENTRICLE:   Normal left ventricular chamber size.   Normal left ventricular wall thickness.   Normal left ventricular systolic function.   LVEF = 69%   There are no regional wall motion abnormalities.   Focal myocardial edema, T2 = 61 msec in the inferolateral apex. Otherwise normal T2 values in myocardium.   Normal first pass perfusion.   There is post contrast delayed myocardial enhancement. There is subendocardial LGE in the lateral apex, appears less than 50% of myocardial thickness. This finding and localization is best visualized in series 38 (Hi Res PSIR). This finding most  consistent with small area of infarct with preserved viability.   Normal T1 myocardial nulling kinetics suggest against a diagnosis of cardiac amyloidosis.   ECV = 25%, normal range   RIGHT VENTRICLE:   Normal right ventricular chamber size.   Normal right ventricular wall thickness.   Normal right ventricular systolic function.   RVEF = 58%   There are no regional wall motion abnormalities.   No post contrast delayed myocardial enhancement.   ATRIA:   Normal left atrial size.   Normal right atrial size.   VALVES:   No significant valvular abnormalities.   Flow quantitation of aortic valve shows no significant aortic valve regurgitation, likely trivial.   PERICARDIUM:   Normal pericardium.  No pericardial effusion.   OTHER: No significant extracardiac findings.   MEASUREMENTS: Left ventricle:   LV Female   LV EF: 69% (normal 56-78%)   Absolute volumes:   LV EDV: 169m (Normal 52-141 mL)   LV ESV: 312m(Normal 13-51 mL)   LV SV: 8458mNormal 33-97 mL)  CO: 4.8L/min (Normal 2.7-6.0 L/min)   Indexed volumes:   LV EDV: 47m/sq-m (Normal 41-81 mL/sq-m)   LV ESV: 12msq-m (Normal 12-21 mL/sq-m)   LV SV: 4034mq-m (Normal 26-56 mL/sq-m)   CI: 2.33L/min/sq-m (normal 1.8-3.8 L/min/sq-m)   Right ventricle:   RV female   RV EF: 58% (Normal 47-80%)   Absolute volumes:   RV EDV: 127 mL (Normal 58-154 mL)   RV ESV: 53 mL (Normal 12-68 mL)   RV SV: 74 mL (Normal 35-98 mL)   CO: 4.3 L/min (Normal 2.7-6 L/min)   Indexed volumes:   RV EDV: 61 ML/sq-m (Normal 48-87 mL/sq-m)   RV ESV: 25 mL/sq-m (Normal 11-28 mL/sq-m)   RV SV: 36 mL/sq-m (Normal 27-57 mL/sq-m)   CI: 2.07 L/min/sq-m (Normal 1.8-3.8 L/min/sq-m)   IMPRESSION: 1. Normal biventricular chamber size and function. LVEF 69%, RVEF 58%.   2. Study performed to evaluate source of elevated troponin and chest pain with grossly normal epicardial coronary arteries. There is small area  of myocardial edema and subendocardial delayed enhancement in the inferolateral LV apex, consistent with infarction. Findings less suggestive of myocarditis.      EKG:  EKG is *** ordered today.  The ekg ordered today demonstrates ***  Recent Labs: 04/15/2021: Magnesium 2.0 04/18/2021: TSH 2.237 12/26/2021: ALT 10; BUN 19; Creatinine, Ser 0.86; Hemoglobin 13.4; Platelets 274; Potassium 4.2; Sodium 146  Recent Lipid Panel    Component Value Date/Time   CHOL 124 12/26/2021 1200   TRIG 104 12/26/2021 1200   HDL 65 12/26/2021 1200   CHOLHDL 1.9 12/26/2021 1200   CHOLHDL 2.4 04/16/2021 0051   VLDL 16 04/16/2021 0051   LDLCALC 40 12/26/2021 1200     Risk Assessment/Calculations:   {Does this patient have ATRIAL FIBRILLATION?:[phone]}       Physical Exam:    VS:  LMP 08/04/2011     Wt Readings from Last 3 Encounters:  12/26/21 193 lb (87.5 kg)  08/21/21 194 lb 0.1 oz (88 kg)  08/17/21 194 lb (88 kg)     GEN: *** Well nourished, well developed in no acute distress HEENT: Normal NECK: No JVD; No carotid bruits LYMPHATICS: No lymphadenopathy CARDIAC: ***RRR, no murmurs, rubs, gallops RESPIRATORY:  Clear to auscultation without rales, wheezing or rhonchi  ABDOMEN: Soft, non-tender, non-distended MUSCULOSKELETAL:  No edema; No deformity  SKIN: Warm and dry NEUROLOGIC:  Alert and oriented x 3 PSYCHIATRIC:  Normal affect   ASSESSMENT:    No diagnosis found. PLAN:    In order of problems listed above:  #NSTEMI: Patient with history of NSTEMI in 03/2021 with no epicardial disease on cath. RMI with small area of inferolateral infarct thought to be small vessel disease in the setting of DMII. Recommended for 1 year of DAPT.  -Cath with no epicardial coronary disease - Mri with small area of infarct in inferolateral region of apex.  - Continue ASA 81 mg daily, plavix 75 mg daily for 1 year  - Continue metoprolol tartrate 12.5 mg BID. - Continue amlodipine '5mg'$  daily -  continue atorvastatin 40 mg daily.    #Anxiety: -Management per PCP       {Are you ordering a CV Procedure (e.g. stress test, cath, DCCV, TEE, etc)?   Press F2        :[id number]} Medication Adjustments/Labs and Tests Ordered: Current medicines are reviewed at length with the patient today.  Concerns regarding medicines are outlined above.  No orders of the defined types were placed in this  encounter.  No orders of the defined types were placed in this encounter.   There are no Patient Instructions on file for this visit.   Signed, Freada Bergeron, MD  03/08/2022 12:46 PM    [place]

## 2022-03-09 ENCOUNTER — Ambulatory Visit: Payer: BC Managed Care – PPO | Admitting: Cardiology

## 2022-03-10 ENCOUNTER — Other Ambulatory Visit: Payer: Self-pay | Admitting: Nurse Practitioner

## 2022-03-10 DIAGNOSIS — R0982 Postnasal drip: Secondary | ICD-10-CM

## 2022-03-21 NOTE — Progress Notes (Signed)
Cardiology Office Note:    Date:  03/23/2022   ID:  Lisa Casey, DOB 1961-06-26, MRN [id number]  PCP:  Lisa Casey, [place] Providers Cardiologist:  Lisa Munroe, Lisa Casey     Referring Lisa Casey: Lisa Brine, Lisa Casey   History of Present Illness:    Lisa Casey is a 61 y.o. female with a hx of NSTEMI on 03/2021, obesity, anxiety, pre-DM, fibromyalgia, GERD, HTN, historically short PR interval, former brief tobacco use who presents to clinic for follow-up.  She presented to the emergency department on 04/15/2021 with central chest pain and abdominal pain that woke her from sleep.  Trop 5>364. She underwent chest CT which showed normal aorta, abnormal kidney suggesting nephro calcinosis, and some calcified coronary artery atherosclerosis.  She underwent cardiac catheterization on 04/17/2021 which showed normal coronary anatomy and no plaque.  MRI showed a small area of inferior lateral apical infarct.  It was felt to be likely to be related to small distal vessel occlusion secondary to diabetes and was recommended for 1 year of DAPT.  Last seen in clinic by Lisa Memos, Lisa Casey for intermittent chest pain. Her amlodipine was increased to $RemoveBefo'5mg'EUKpEGYAKbr$  daily.  Today, the patient overall feels okay. Has been struggling with a lot of anxiety and is planning to talk to her primary care provider further and is interested in seeking out a counselor/therapist. Has chest pain when she becomes very overwhelmed. No lightheadedness, dizziness, or syncope. Has some SOB which again worsens with her anxiety. No exertional symptoms. No LE edema, orthopnea or PND.  Past Medical History:  Diagnosis Date   Anxiety    HX OF ANXIETY ATTACKS   Back pain, chronic    Bronchitis    Cholelithiasis 04/15/2021   Diabetes ([place])    started Metformin in 02/2021   Fibromyalgia    GERD (gastroesophageal reflux disease)    History of kidney stones    Hypertension    Migraines    HX OF MIGRAINES   Myocardial  infarction [facility])    july 2022   Obesity    Pneumonia    06-2021   Shortened PR interval     Past Surgical History:  Procedure Laterality Date   CONSTIPATION     CYSTOSCOPY W/ URETERAL STENT PLACEMENT  09/23/2012   Procedure: CYSTOSCOPY WITH RETROGRADE PYELOGRAM/URETERAL STENT PLACEMENT;  Surgeon: Alexis Frock, Lisa Casey;  Location: [facility];  Service: Urology;  Laterality: Right;   CYSTOSCOPY WITH RETROGRADE PYELOGRAM, URETEROSCOPY AND STENT PLACEMENT  10/08/2012   Procedure: CYSTOSCOPY WITH RETROGRADE PYELOGRAM, URETEROSCOPY AND STENT PLACEMENT;  Surgeon: Alexis Frock, Lisa Casey;  Location: [facility];  Service: Urology;  Laterality: Right;  90 MIN NO RETROGRADE NEEDS FLEX URETEROSCOPE    CYSTOSCOPY/URETEROSCOPY/HOLMIUM LASER/STENT PLACEMENT Right 08/21/2021   Procedure: CYSTOSCOPY RIGHT /URETEROSCOPY/HOLMIUM LASER/STENT PLACEMENT;  Surgeon: Lucas Mallow, Lisa Casey;  Location: [facility];  Service: Urology;  Laterality: Right;   HOLMIUM LASER APPLICATION  6/80/3212   Procedure: HOLMIUM LASER APPLICATION;  Surgeon: Alexis Frock, Lisa Casey;  Location: [facility];  Service: Urology;  Laterality: Right;   LEFT HEART CATH AND CORONARY ANGIOGRAPHY N/A 04/17/2021   Procedure: LEFT HEART CATH AND CORONARY ANGIOGRAPHY;  Surgeon: Burnell Blanks, Lisa Casey;  Location: [place] [facility];  Service: Cardiovascular;  Laterality: N/A;   LEFT HEART CATHETERIZATION WITH CORONARY ANGIOGRAM N/A 10/29/2011   Procedure: LEFT HEART CATHETERIZATION WITH CORONARY ANGIOGRAM;  Surgeon: Leonie Man, Lisa Casey;  Location: [facility];  Service: Cardiovascular;  Laterality: N/A;   ROBOTIC ASSISTED LAP VAGINAL HYSTERECTOMY  09/14/2011   Procedure: ROBOTIC ASSISTED LAPAROSCOPIC VAGINAL HYSTERECTOMY;  Surgeon: Agnes Lawrence, Lisa Casey;  Location: [facility];  Service: Gynecology;  Laterality: N/A;   TUBAL LIGATION      Current Medications: Current Meds  Medication Sig   acetaminophen (TYLENOL) 500 MG tablet Take  1,000 mg by mouth every 6 (six) hours as needed for moderate pain or headache.   albuterol (VENTOLIN HFA) 108 (90 Base) MCG/ACT inhaler Inhale 2 puffs into the lungs every 6 (six) hours as needed for wheezing or shortness of breath.   amLODipine (NORVASC) 5 MG tablet Take 1 tablet (5 mg total) by mouth daily.   aspirin EC 81 MG EC tablet Take 1 tablet (81 mg total) by mouth daily. Swallow whole.   atorvastatin (LIPITOR) 10 MG tablet Take 1 tab by mouth MWF   Blood Glucose Monitoring Suppl (ONETOUCH VERIO) w/Device KIT Use as directed to check blood sugars 2 times per day dx: e11.65   clopidogrel (PLAVIX) 75 MG tablet Take 1 tablet (75 mg total) by mouth daily.   glucose blood (ONETOUCH VERIO) test strip Use as directed to check blood sugars 2 times per day dx: e11.65   hydrOXYzine (VISTARIL) 25 MG capsule TAKE 1 CAPSULE BY MOUTH THREE TIMES DAILY AS NEEDED FOR ANXIETY   levocetirizine (XYZAL) 5 MG tablet TAKE 1 TABLET BY MOUTH ONCE DAILY IN THE EVENING   metFORMIN (GLUCOPHAGE) 1000 MG tablet Take 1 tablet (1,000 mg total) by mouth 2 (two) times daily with a meal. (Patient taking differently: Take 1,000 mg by mouth 2 (two) times daily with a meal. Per patient taking 500 mg twice a day)   metoprolol tartrate (LOPRESSOR) 25 MG tablet Take 1/2 (one-half) tablet by mouth twice daily   Multiple Vitamin (MULTIVITAMIN WITH MINERALS) TABS tablet Take 1 tablet by mouth daily.   nitroGLYCERIN (NITROSTAT) 0.4 MG SL tablet Place 1 tablet (0.4 mg total) under the tongue every 5 (five) minutes as needed for chest pain.   OneTouch Delica Lancets 46N MISC Use as directed to check blood sugars 2 times per day dx: e11.65   pregabalin (LYRICA) 150 MG capsule Take 1 capsule by mouth twice daily     Allergies:   Codeine   Social History   Socioeconomic History   Marital status: Married    Spouse name: Not on file   Number of children: Not on file   Years of education: Not on file   Highest education level: Not  on file  Occupational History   Occupation: school bus driver  Tobacco Use   Smoking status: Former    Packs/day: 0.25    Years: 10.00    Total pack years: 2.50    Types: Cigarettes    Quit date: 1997    Years since quitting: 26.5   Smokeless tobacco: Never  Vaping Use   Vaping Use: Never used  Substance and Sexual Activity   Alcohol use: No   Drug use: No   Sexual activity: Yes    Birth control/protection: Other-see comments    Comment: hysterectomy  Other Topics Concern   Not on file  Social History Narrative   Not on file   Social Determinants of Health   Financial Resource Strain: Not on file  Food Insecurity: Not on file  Transportation Needs: Not on file  Physical Activity: Not on file  Stress: Not on file  Social Connections: Not on file  Family History: The patient's family history includes Breast cancer in her paternal grandmother; Breast cancer (age of onset: 72) in her mother. There is no history of Heart failure or Coronary artery disease.  ROS:   Please see the history of present illness.     All other systems reviewed and are negative.  EKGs/Labs/Other Studies Reviewed:    The following studies were reviewed today: Cardiac catheterization 04/17/2021 No angiographic evidence of CAD LVEDP 15-17 mmHg   Recommendations: No further ischemic testing. Consider coronary vasospasm as the etiology of her chest pain and elevated troponin.    Cardiac MRI 04/18/2021 FINDINGS: LEFT VENTRICLE:   Normal left ventricular chamber size.   Normal left ventricular wall thickness.   Normal left ventricular systolic function.   LVEF = 69%   There are no regional wall motion abnormalities.   Focal myocardial edema, T2 = 61 msec in the inferolateral apex. Otherwise normal T2 values in myocardium.   Normal first pass perfusion.   There is post contrast delayed myocardial enhancement. There is subendocardial LGE in the lateral apex, appears less than 50%  of myocardial thickness. This finding and localization is best visualized in series 38 (Hi Res PSIR). This finding most consistent with small area of infarct with preserved viability.   Normal T1 myocardial nulling kinetics suggest against a diagnosis of cardiac amyloidosis.   ECV = 25%, normal range   RIGHT VENTRICLE:   Normal right ventricular chamber size.   Normal right ventricular wall thickness.   Normal right ventricular systolic function.   RVEF = 58%   There are no regional wall motion abnormalities.   No post contrast delayed myocardial enhancement.   ATRIA:   Normal left atrial size.   Normal right atrial size.   VALVES:   No significant valvular abnormalities.   Flow quantitation of aortic valve shows no significant aortic valve regurgitation, likely trivial.   PERICARDIUM:   Normal pericardium.  No pericardial effusion.   OTHER: No significant extracardiac findings.   MEASUREMENTS: Left ventricle:   LV Female   LV EF: 69% (normal 56-78%)   Absolute volumes:   LV EDV: 141mL (Normal 52-141 mL)   LV ESV: 68mL (Normal 13-51 mL)   LV SV: 58mL (Normal 33-97 mL)   CO: 4.8L/min (Normal 2.7-6.0 L/min)   Indexed volumes:   LV EDV: 25mL/sq-m (Normal 41-81 mL/sq-m)   LV ESV: 42mL/sq-m (Normal 12-21 mL/sq-m)   LV SV: 62mL/sq-m (Normal 26-56 mL/sq-m)   CI: 2.33L/min/sq-m (normal 1.8-3.8 L/min/sq-m)   Right ventricle:   RV female   RV EF: 58% (Normal 47-80%)   Absolute volumes:   RV EDV: 127 mL (Normal 58-154 mL)   RV ESV: 53 mL (Normal 12-68 mL)   RV SV: 74 mL (Normal 35-98 mL)   CO: 4.3 L/min (Normal 2.7-6 L/min)   Indexed volumes:   RV EDV: 61 ML/sq-m (Normal 48-87 mL/sq-m)   RV ESV: 25 mL/sq-m (Normal 11-28 mL/sq-m)   RV SV: 36 mL/sq-m (Normal 27-57 mL/sq-m)   CI: 2.07 L/min/sq-m (Normal 1.8-3.8 L/min/sq-m)   IMPRESSION: 1. Normal biventricular chamber size and function. LVEF 69%, RVEF 58%.   2. Study performed to  evaluate source of elevated troponin and chest pain with grossly normal epicardial coronary arteries. There is small area of myocardial edema and subendocardial delayed enhancement in the inferolateral LV apex, consistent with infarction. Findings less suggestive of myocarditis.     EKG:  No new tracing  Recent Labs: 04/15/2021: Magnesium 2.0 04/18/2021: TSH 2.237 12/26/2021:  ALT 10; BUN 19; Creatinine, Ser 0.86; Hemoglobin 13.4; Platelets 274; Potassium 4.2; Sodium 146  Recent Lipid Panel    Component Value Date/Time   CHOL 124 12/26/2021 1200   TRIG 104 12/26/2021 1200   HDL 65 12/26/2021 1200   CHOLHDL 1.9 12/26/2021 1200   CHOLHDL 2.4 04/16/2021 0051   VLDL 16 04/16/2021 0051   LDLCALC 40 12/26/2021 1200     Risk Assessment/Calculations:           Physical Exam:    VS:  BP 122/86   Pulse 76   Ht 5\' 5"  (1.651 m)   Wt 186 lb 6.4 oz (84.6 kg)   LMP 08/04/2011   SpO2 97%   BMI 31.02 kg/m     Wt Readings from Last 3 Encounters:  03/23/22 186 lb 6.4 oz (84.6 kg)  12/26/21 193 lb (87.5 kg)  08/21/21 194 lb 0.1 oz (88 kg)     GEN:  Well nourished, well developed in no acute distress HEENT: Normal NECK: No JVD; No carotid bruits CARDIAC: RRR, no murmurs, rubs, gallops RESPIRATORY:  Clear to auscultation without rales, wheezing or rhonchi  ABDOMEN: Soft, non-tender, non-distended MUSCULOSKELETAL:  No edema; No deformity  SKIN: Warm and dry NEUROLOGIC:  Alert and oriented x 3 PSYCHIATRIC:  Normal affect   ASSESSMENT:    1. Non-ST elevation (NSTEMI) myocardial infarction ([place])   2. Anxiety   3. Primary hypertension    PLAN:    In order of problems listed above:  #NSTEMI: Patient with history of NSTEMI in 03/2021 with no epicardial disease on cath. CMR with small area of inferolateral infarct thought to be small vessel disease in the setting of DMII. Recommended for 1 year of DAPT. Currently, having episodes of chest pain when suffering with anxiety. May also  have element of vasospasm. Will do trial of nitro as needed for chest pain (helped symptoms in the past) and if improves, can add imdur at that time.  -Cath with no epicardial coronary disease - Mri with small area of infarct in inferolateral region of apex.  - Continue ASA 81 mg daily, plavix 75 mg daily for 1 year; can stop plavix on 04/17/22 (discussed with patient) - Continue metoprolol tartrate 12.5 mg BID. - Continue amlodipine 5mg  daily - Continue atorvastatin 40 mg daily - Will do trial of nitro SL for chest pain and if improves, can add imdur for possible vasospasm  #HTN: -Continue amlodipine 5mg  daily -Continue metop 12.5mg  BID   #Anxiety: Likely the primary driver of chest pain and SOB. Patient is very motivated to get help. Has close PCP follow-up as well. -Management per PCP            Medication Adjustments/Labs and Tests Ordered: Current medicines are reviewed at length with the patient today.  Concerns regarding medicines are outlined above.  No orders of the defined types were placed in this encounter.  Meds ordered this encounter  Medications   nitroGLYCERIN (NITROSTAT) 0.4 MG SL tablet    Sig: Place 1 tablet (0.4 mg total) under the tongue every 5 (five) minutes as needed for chest pain.    Dispense:  25 tablet    Refill:  4    Patient Instructions  Medication Instructions:  You may discontinue your Plavix in 2 weeks. You may take SL Nitroglycerin as needed for chest pain. Continue all other medications as listed.  *If you need a refill on your cardiac medications before your next appointment, please call your pharmacy*  Follow-Up: At [facility], you and your health needs are our priority.  As part of our continuing mission to provide you with exceptional heart care, we have created designated Provider Care Teams.  These Care Teams include your primary Cardiologist (physician) and Advanced Practice Providers (APPs -  Physician Assistants and Nurse  Practitioners) who all work together to provide you with the care you need, when you need it.  We recommend signing up for the patient portal called "MyChart".  Sign up information is provided on this After Visit Summary.  MyChart is used to connect with patients for Virtual Visits (Telemedicine).  Patients are able to view lab/test results, encounter notes, upcoming appointments, etc.  Non-urgent messages can be sent to your provider as well.   To learn more about what you can do with MyChart, go to [email or web address].    Your next appointment:   6 month(s)  The format for your next appointment:   In Person  Provider:   Dr Gwyndolyn Kaufman  Nitroglycerin Sublingual Tablets What is this medication? NITROGLYCERIN (nye troe GLI ser in) prevents and treats chest pain (angina). It works by relaxing blood vessels, which decreases the amount of work the heart has to do. It belongs to a group of medications called nitrates. This medicine may be used for other purposes; ask your health care provider or pharmacist if you have questions. COMMON BRAND NAME(S): Nitroquick, Nitrostat, Nitrotab What should I tell my care team before I take this medication? They need to know if you have any of these conditions: Anemia Head injury, recent stroke, or bleeding in the brain Liver disease Previous heart attack An unusual or allergic reaction to nitroglycerin, other medications, foods, dyes, or preservatives Pregnant or trying to get pregnant Breast-feeding How should I use this medication? Take this medication by mouth as needed. Use at the first sign of an angina attack (chest pain or tightness). You can also take this medication 5 to 10 minutes before an event likely to produce chest pain. Follow the directions exactly as written on the prescription label. Place one tablet under your tongue and let it dissolve. Do not swallow whole. Replace the dose if you accidentally swallow it. It will help if  your mouth is not dry. Saliva around the tablet will help it to dissolve more quickly. Do not eat or drink, smoke or chew tobacco while a tablet is dissolving. Sit down when taking this medication. In an angina attack, you should feel better within 5 minutes after your first dose. You can take a dose every 5 minutes up to a total of 3 doses. If you do not feel better or feel worse after 1 dose, call 9-1-1 at once. Do not take more than 3 doses in 15 minutes. Your care team might give you other directions. Follow those directions if they do. Do not take your medication more often than directed. Talk to your care team about the use of this medication in children. Special care may be needed. Overdosage: If you think you have taken too much of this medicine contact a poison control center or emergency room at once. NOTE: This medicine is only for you. Do not share this medicine with others. What if I miss a dose? This does not apply. This medication is only used as needed. What may interact with this medication? Do not take this medication with any of the following: Certain migraine medications like ergotamine and dihydroergotamine (DHE) Medications used to treat erectile dysfunction like sildenafil,  tadalafil, and vardenafil Riociguat This medication may also interact with the following: Alteplase Aspirin Heparin Medications for high blood pressure Medications for mental depression Other medications used to treat angina Phenothiazines like chlorpromazine, mesoridazine, prochlorperazine, thioridazine This list may not describe all possible interactions. Give your health care provider a list of all the medicines, herbs, non-prescription drugs, or dietary supplements you use. Also tell them if you smoke, drink alcohol, or use illegal drugs. Some items may interact with your medicine. What should I watch for while using this medication? Tell your care team if you feel your medication is no longer  working. Keep this medication with you at all times. Sit or lie down when you take your medication to prevent falling if you feel dizzy or faint after using it. Try to remain calm. This will help you to feel better faster. If you feel dizzy, take several deep breaths and lie down with your feet propped up, or bend forward with your head resting between your knees. You may get drowsy or dizzy. Do not drive, use machinery, or do anything that needs mental alertness until you know how this medication affects you. Do not stand or sit up quickly, especially if you are an older patient. This reduces the risk of dizzy or fainting spells. Alcohol can make you more drowsy and dizzy. Avoid alcoholic drinks. Do not treat yourself for coughs, colds, or pain while you are taking this medication without asking your care team for advice. Some ingredients may increase your blood pressure. What side effects may I notice from receiving this medication? Side effects that you should report to your care team as soon as possible: Allergic reactions--skin rash, itching, hives, swelling of the face, lips, tongue, or throat Headache, unusual weakness or fatigue, shortness of breath, nausea, vomiting, rapid heartbeat, blue skin or lips, which may be signs of methemoglobinemia Increased pressure around the brain--severe headache, blurry vision, change in vision, nausea, vomiting Low blood pressure--dizziness, feeling faint or lightheaded, blurry vision Slow heartbeat--dizziness, feeling faint or lightheaded, confusion, trouble breathing, unusual weakness or fatigue Worsening chest pain (angina)--pain, pressure, or tightness in the chest, neck, back, or arms Side effects that usually do not require medical attention (report to your care team if they continue or are bothersome): Dizziness Flushing Headache This list may not describe all possible side effects. Call your doctor for medical advice about side effects. You may report  side effects to FDA at 1-800-FDA-1088. Where should I keep my medication? Keep out of the reach of children. Store at room temperature between 20 and 25 degrees C (68 and 77 degrees F). Store in [facility] of [employer]. Protect from light and moisture. Keep tightly closed. Throw away any unused medication after the expiration date. NOTE: This sheet is a summary. It may not cover all possible information. If you have questions about this medicine, talk to your doctor, pharmacist, or health care provider.  2023 Elsevier/Gold Standard (2020-12-13 00:00:00)  Important Information About Sugar         Signed, Freada Bergeron, Lisa Casey  03/23/2022 11:40 AM    [place]

## 2022-03-23 ENCOUNTER — Ambulatory Visit: Payer: BC Managed Care – PPO | Admitting: Cardiology

## 2022-03-23 ENCOUNTER — Encounter: Payer: Self-pay | Admitting: Cardiology

## 2022-03-23 VITALS — BP 122/86 | HR 76 | Ht 65.0 in | Wt 186.4 lb

## 2022-03-23 DIAGNOSIS — I10 Essential (primary) hypertension: Secondary | ICD-10-CM | POA: Diagnosis not present

## 2022-03-23 DIAGNOSIS — I214 Non-ST elevation (NSTEMI) myocardial infarction: Secondary | ICD-10-CM

## 2022-03-23 DIAGNOSIS — F419 Anxiety disorder, unspecified: Secondary | ICD-10-CM

## 2022-03-23 MED ORDER — NITROGLYCERIN 0.4 MG SL SUBL: 0.4000 mg | SUBLINGUAL_TABLET | SUBLINGUAL | 4 refills | Status: DC | PRN

## 2022-03-23 NOTE — Patient Instructions (Addendum)
Medication Instructions:  You may discontinue your Plavix in 2 weeks. You may take SL Nitroglycerin as needed for chest pain. Continue all other medications as listed.  *If you need a refill on your cardiac medications before your next appointment, please call your pharmacy*  Follow-Up: At [facility], you and your health needs are our priority.  As part of our continuing mission to provide you with exceptional heart care, we have created designated Provider Care Teams.  These Care Teams include your primary Cardiologist (physician) and Advanced Practice Providers (APPs -  Physician Assistants and Nurse Practitioners) who all work together to provide you with the care you need, when you need it.  We recommend signing up for the patient portal called "MyChart".  Sign up information is provided on this After Visit Summary.  MyChart is used to connect with patients for Virtual Visits (Telemedicine).  Patients are able to view lab/test results, encounter notes, upcoming appointments, etc.  Non-urgent messages can be sent to your provider as well.   To learn more about what you can do with MyChart, go to [email or web address].    Your next appointment:   6 month(s)  The format for your next appointment:   In Person  Provider:   Dr Gwyndolyn Kaufman  Nitroglycerin Sublingual Tablets What is this medication? NITROGLYCERIN (nye troe GLI ser in) prevents and treats chest pain (angina). It works by relaxing blood vessels, which decreases the amount of work the heart has to do. It belongs to a group of medications called nitrates. This medicine may be used for other purposes; ask your health care provider or pharmacist if you have questions. COMMON BRAND NAME(S): Nitroquick, Nitrostat, Nitrotab What should I tell my care team before I take this medication? They need to know if you have any of these conditions: Anemia Head injury, recent stroke, or bleeding in the brain Liver  disease Previous heart attack An unusual or allergic reaction to nitroglycerin, other medications, foods, dyes, or preservatives Pregnant or trying to get pregnant Breast-feeding How should I use this medication? Take this medication by mouth as needed. Use at the first sign of an angina attack (chest pain or tightness). You can also take this medication 5 to 10 minutes before an event likely to produce chest pain. Follow the directions exactly as written on the prescription label. Place one tablet under your tongue and let it dissolve. Do not swallow whole. Replace the dose if you accidentally swallow it. It will help if your mouth is not dry. Saliva around the tablet will help it to dissolve more quickly. Do not eat or drink, smoke or chew tobacco while a tablet is dissolving. Sit down when taking this medication. In an angina attack, you should feel better within 5 minutes after your first dose. You can take a dose every 5 minutes up to a total of 3 doses. If you do not feel better or feel worse after 1 dose, call 9-1-1 at once. Do not take more than 3 doses in 15 minutes. Your care team might give you other directions. Follow those directions if they do. Do not take your medication more often than directed. Talk to your care team about the use of this medication in children. Special care may be needed. Overdosage: If you think you have taken too much of this medicine contact a poison control center or emergency room at once. NOTE: This medicine is only for you. Do not share this medicine with others. What if I miss  a dose? This does not apply. This medication is only used as needed. What may interact with this medication? Do not take this medication with any of the following: Certain migraine medications like ergotamine and dihydroergotamine (DHE) Medications used to treat erectile dysfunction like sildenafil, tadalafil, and vardenafil Riociguat This medication may also interact with the  following: Alteplase Aspirin Heparin Medications for high blood pressure Medications for mental depression Other medications used to treat angina Phenothiazines like chlorpromazine, mesoridazine, prochlorperazine, thioridazine This list may not describe all possible interactions. Give your health care provider a list of all the medicines, herbs, non-prescription drugs, or dietary supplements you use. Also tell them if you smoke, drink alcohol, or use illegal drugs. Some items may interact with your medicine. What should I watch for while using this medication? Tell your care team if you feel your medication is no longer working. Keep this medication with you at all times. Sit or lie down when you take your medication to prevent falling if you feel dizzy or faint after using it. Try to remain calm. This will help you to feel better faster. If you feel dizzy, take several deep breaths and lie down with your feet propped up, or bend forward with your head resting between your knees. You may get drowsy or dizzy. Do not drive, use machinery, or do anything that needs mental alertness until you know how this medication affects you. Do not stand or sit up quickly, especially if you are an older patient. This reduces the risk of dizzy or fainting spells. Alcohol can make you more drowsy and dizzy. Avoid alcoholic drinks. Do not treat yourself for coughs, colds, or pain while you are taking this medication without asking your care team for advice. Some ingredients may increase your blood pressure. What side effects may I notice from receiving this medication? Side effects that you should report to your care team as soon as possible: Allergic reactions--skin rash, itching, hives, swelling of the face, lips, tongue, or throat Headache, unusual weakness or fatigue, shortness of breath, nausea, vomiting, rapid heartbeat, blue skin or lips, which may be signs of methemoglobinemia Increased pressure around the  brain--severe headache, blurry vision, change in vision, nausea, vomiting Low blood pressure--dizziness, feeling faint or lightheaded, blurry vision Slow heartbeat--dizziness, feeling faint or lightheaded, confusion, trouble breathing, unusual weakness or fatigue Worsening chest pain (angina)--pain, pressure, or tightness in the chest, neck, back, or arms Side effects that usually do not require medical attention (report to your care team if they continue or are bothersome): Dizziness Flushing Headache This list may not describe all possible side effects. Call your doctor for medical advice about side effects. You may report side effects to FDA at 1-800-FDA-1088. Where should I keep my medication? Keep out of the reach of children. Store at room temperature between 20 and 25 degrees C (68 and 77 degrees F). Store in [facility]. Protect from light and moisture. Keep tightly closed. Throw away any unused medication after the expiration date. NOTE: This sheet is a summary. It may not cover all possible information. If you have questions about this medicine, talk to your doctor, pharmacist, or health care provider.  2023 Elsevier/Gold Standard (2020-12-13 00:00:00)  Important Information About Sugar

## 2022-03-27 ENCOUNTER — Encounter: Payer: Self-pay | Admitting: Nurse Practitioner

## 2022-03-27 ENCOUNTER — Ambulatory Visit: Payer: BC Managed Care – PPO | Admitting: Nurse Practitioner

## 2022-03-27 VITALS — BP 122/88 | HR 87 | Temp 98.1°F | Ht 65.0 in | Wt 191.0 lb

## 2022-03-27 DIAGNOSIS — Z6831 Body mass index (BMI) 31.0-31.9, adult: Secondary | ICD-10-CM

## 2022-03-27 DIAGNOSIS — R7303 Prediabetes: Secondary | ICD-10-CM | POA: Diagnosis not present

## 2022-03-27 DIAGNOSIS — I10 Essential (primary) hypertension: Secondary | ICD-10-CM | POA: Diagnosis not present

## 2022-03-27 DIAGNOSIS — I7090 Unspecified atherosclerosis: Secondary | ICD-10-CM | POA: Diagnosis not present

## 2022-03-27 DIAGNOSIS — E6609 Other obesity due to excess calories: Secondary | ICD-10-CM

## 2022-03-27 DIAGNOSIS — F419 Anxiety disorder, unspecified: Secondary | ICD-10-CM | POA: Diagnosis not present

## 2022-03-27 MED ORDER — BUSPIRONE HCL 5 MG PO TABS: 5.0000 mg | ORAL_TABLET | Freq: Two times a day (BID) | ORAL | 2 refills | Status: DC

## 2022-03-27 MED ORDER — METFORMIN HCL ER 750 MG PO TB24: 750.0000 mg | ORAL_TABLET | Freq: Every day | ORAL | 2 refills | Status: DC

## 2022-03-27 NOTE — Progress Notes (Signed)
I,Victoria T Hamilton,acting as a Education administrator for [name], FNP.,have documented all relevant documentation on the behalf of [name], FNP,as directed by  [name], FNP while in the presence of [name], [place].  Subjective:     Patient ID: Lisa Casey , female    DOB: Apr 21, 1961 , 61 y.o.   MRN: [id number]   Chief Complaint  Patient presents with   Hypertension    HPI  Pt presents today for bpc. She took her medications prior to her visit. She ate stuffed peppers from [employer]. She has had coffee and one bottle of water since last night.   Pt has a concern with metformin, she reports excessively using the bathroom, gassy , uncomfortable on $RemoveBeforeDE'1000mg'hdwVqtFWUfGXGoI$ . She was out of work for 3 days when she first started new dose, she would like to know if she could go back to 500 mg or suggest another medication.  She is not exercising. She admits to eating poorly over the last month.   Wt Readings from Last 3 Encounters: 03/27/22 : 191 lb (86.6 kg) 03/23/22 : 186 lb 6.4 oz (84.6 kg) 12/26/21 : 193 lb (87.5 kg)    Hypertension This is a chronic problem. The current episode started more than 1 year ago. The problem is controlled. Pertinent negatives include no anxiety, chest pain or palpitations. There are no associated agents to hypertension. Risk factors for coronary artery disease include obesity and sedentary lifestyle. Past treatments include diuretics. There are no compliance problems.  There is no history of angina or kidney disease. There is no history of chronic renal disease.     Past Medical History:  Diagnosis Date   Anxiety    HX OF ANXIETY ATTACKS   Back pain, chronic    Bronchitis    Cholelithiasis 04/15/2021   Diabetes ([place])    started Metformin in 02/2021   Fibromyalgia    GERD (gastroesophageal reflux disease)    History of kidney stones    Hypertension    Migraines    HX OF MIGRAINES   Myocardial infarction [facility])    july 2022   Obesity    Pneumonia    06-2021    Shortened PR interval      Family History  Problem Relation Age of Onset   Breast cancer Mother 34   Breast cancer Paternal Grandmother    Heart failure Neg Hx    Coronary artery disease Neg Hx      Current Outpatient Medications:    acetaminophen (TYLENOL) 500 MG tablet, Take 1,000 mg by mouth every 6 (six) hours as needed for moderate pain or headache., Disp: , Rfl:    albuterol (VENTOLIN HFA) 108 (90 Base) MCG/ACT inhaler, Inhale 2 puffs into the lungs every 6 (six) hours as needed for wheezing or shortness of breath., Disp: , Rfl:    amLODipine (NORVASC) 5 MG tablet, Take 1 tablet (5 mg total) by mouth daily., Disp: 90 tablet, Rfl: 3   aspirin EC 81 MG EC tablet, Take 1 tablet (81 mg total) by mouth daily. Swallow whole., Disp: 30 tablet, Rfl: 11   atorvastatin (LIPITOR) 10 MG tablet, Take 1 tab by mouth MWF, Disp: 45 tablet, Rfl: 1   Blood Glucose Monitoring Suppl (ONETOUCH VERIO) w/Device KIT, Use as directed to check blood sugars 2 times per day dx: e11.65, Disp: 1 kit, Rfl: 1   busPIRone (BUSPAR) 5 MG tablet, Take 1 tablet (5 mg total) by mouth 2 (two) times daily., Disp: 60 tablet, Rfl:  2   glucose blood (ONETOUCH VERIO) test strip, Use as directed to check blood sugars 2 times per day dx: e11.65, Disp: 100 each, Rfl: 5   hydrOXYzine (VISTARIL) 25 MG capsule, TAKE 1 CAPSULE BY MOUTH THREE TIMES DAILY AS NEEDED FOR ANXIETY, Disp: 90 capsule, Rfl: 0   levocetirizine (XYZAL) 5 MG tablet, TAKE 1 TABLET BY MOUTH ONCE DAILY IN THE EVENING, Disp: 90 tablet, Rfl: 0   metFORMIN (GLUCOPHAGE-XR) 750 MG 24 hr tablet, Take 1 tablet (750 mg total) by mouth daily with breakfast., Disp: 30 tablet, Rfl: 2   metoprolol tartrate (LOPRESSOR) 25 MG tablet, Take 1/2 (one-half) tablet by mouth twice daily, Disp: 60 tablet, Rfl: 0   Multiple Vitamin (MULTIVITAMIN WITH MINERALS) TABS tablet, Take 1 tablet by mouth daily., Disp: , Rfl:    nitroGLYCERIN (NITROSTAT) 0.4 MG SL tablet, Place 1 tablet (0.4 mg  total) under the tongue every 5 (five) minutes as needed for chest pain., Disp: 25 tablet, Rfl: 4   OneTouch Delica Lancets 67J MISC, Use as directed to check blood sugars 2 times per day dx: e11.65, Disp: 100 each, Rfl: 5   pregabalin (LYRICA) 150 MG capsule, Take 1 capsule by mouth twice daily, Disp: 180 capsule, Rfl: 0   clopidogrel (PLAVIX) 75 MG tablet, Take 1 tablet by mouth once daily, Disp: 30 tablet, Rfl: 0   Allergies  Allergen Reactions   Codeine Itching     Review of Systems  Constitutional: Negative.   Respiratory: Negative.    Cardiovascular: Negative.  Negative for chest pain, palpitations and leg swelling.  Neurological: Negative.   Psychiatric/Behavioral: Negative.       Today's Vitals   03/27/22 1223  BP: 122/88  Pulse: 87  Temp: 98.1 F (36.7 C)  SpO2: 94%  Weight: 191 lb (86.6 kg)  Height: $Remove'5\' 5"'lYgMbmG$  (1.651 m)  PainSc: 0-No pain   Body mass index is 31.78 kg/m.  Wt Readings from Last 3 Encounters:  03/27/22 191 lb (86.6 kg)  03/23/22 186 lb 6.4 oz (84.6 kg)  12/26/21 193 lb (87.5 kg)    Objective:  Physical Exam Vitals reviewed.  Constitutional:      General: She is not in acute distress.    Appearance: Normal appearance. She is obese.  Cardiovascular:     Rate and Rhythm: Normal rate and regular rhythm.     Pulses: Normal pulses.     Heart sounds: Normal heart sounds. No murmur heard. Pulmonary:     Effort: Pulmonary effort is normal. No respiratory distress.     Breath sounds: Normal breath sounds. No wheezing.  Skin:    General: Skin is warm and dry.     Capillary Refill: Capillary refill takes less than 2 seconds.  Neurological:     General: No focal deficit present.     Mental Status: She is alert and oriented to person, place, and time.     Cranial Nerves: No cranial nerve deficit.     Motor: No weakness.  Psychiatric:        Mood and Affect: Mood normal.        Behavior: Behavior normal.        Thought Content: Thought content normal.         Cognition and Memory: Cognition normal.        Judgment: Judgment normal.         Assessment And Plan:     1. Essential hypertension Comments: Blood pressure is slightly elevated diastolic, she had just  taken her medication prior to her visit.   2. Prediabetes Comments: She is not tolerating 1000 mg, will change to 750 XR to help with side effects. Continue focusing on healthy diet and regular excercise.  - Hemoglobin A1c - CMP14+EGFR - metFORMIN (GLUCOPHAGE-XR) 750 MG 24 hr tablet; Take 1 tablet (750 mg total) by mouth daily with breakfast.  Dispense: 30 tablet; Refill: 2  3. Atherosclerosis Comments: Continue statin, tolerating well.  - Lipid panel  4. Anxiety Comments: She is to take buspar twice a day.  - busPIRone (BUSPAR) 5 MG tablet; Take 1 tablet (5 mg total) by mouth 2 (two) times daily.  Dispense: 60 tablet; Refill: 2  5. Class 1 obesity due to excess calories without serious comorbidity with body mass index (BMI) of 31.0 to 31.9 in adult She is encouraged to strive for BMI less than 30 to decrease cardiac risk. Advised to aim for at least 150 minutes of exercise per week.  with at least 2 days of strength training.  Encouraged to park further when at the store, take stairs instead of elevators and to walk in place during commercials.  Increase water intake to at least one gallon of water daily.    Patient was given opportunity to ask questions. Patient verbalized understanding of the plan and was able to repeat key elements of the plan. All questions were answered to their satisfaction.  [name], FNP    I, [name], FNP, have reviewed all documentation for this visit. The documentation on 03/27/22 for the exam, diagnosis, procedures, and orders are all accurate and complete.   IF YOU HAVE BEEN REFERRED TO A SPECIALIST, IT MAY TAKE 1-2 WEEKS TO SCHEDULE/PROCESS THE REFERRAL. IF YOU HAVE NOT HEARD FROM US/SPECIALIST IN TWO WEEKS, PLEASE GIVE [place] A CALL AT  [phone].   THE PATIENT IS ENCOURAGED TO PRACTICE SOCIAL DISTANCING DUE TO THE COVID-19 PANDEMIC.

## 2022-03-27 NOTE — Patient Instructions (Signed)
Hypertension, Adult ?Hypertension is another name for high blood pressure. High blood pressure forces your heart to work harder to pump blood. This can cause problems over time. ?There are two numbers in a blood pressure reading. There is a top number (systolic) over a bottom number (diastolic). It is best to have a blood pressure that is below 120/80. ?What are the causes? ?The cause of this condition is not known. Some other conditions can lead to high blood pressure. ?What increases the risk? ?Some lifestyle factors can make you more likely to develop high blood pressure: ?Smoking. ?Not getting enough exercise or physical activity. ?Being overweight. ?Having too much fat, sugar, calories, or salt (sodium) in your diet. ?Drinking too much alcohol. ?Other risk factors include: ?Having any of these conditions: ?Heart disease. ?Diabetes. ?High cholesterol. ?Kidney disease. ?Obstructive sleep apnea. ?Having a family history of high blood pressure and high cholesterol. ?Age. The risk increases with age. ?Stress. ?What are the signs or symptoms? ?High blood pressure may not cause symptoms. Very high blood pressure (hypertensive crisis) may cause: ?Headache. ?Fast or uneven heartbeats (palpitations). ?Shortness of breath. ?Nosebleed. ?Vomiting or feeling like you may vomit (nauseous). ?Changes in how you see. ?Very bad chest pain. ?Feeling dizzy. ?Seizures. ?How is this treated? ?This condition is treated by making healthy lifestyle changes, such as: ?Eating healthy foods. ?Exercising more. ?Drinking less alcohol. ?Your doctor may prescribe medicine if lifestyle changes do not help enough and if: ?Your top number is above 130. ?Your bottom number is above 80. ?Your personal target blood pressure may vary. ?Follow these instructions at home: ?Eating and drinking ? ?If told, follow the DASH eating plan. To follow this plan: ?Fill one half of your plate at each meal with fruits and vegetables. ?Fill one fourth of your plate  at each meal with whole grains. Whole grains include whole-wheat pasta, brown rice, and whole-grain bread. ?Eat or drink low-fat dairy products, such as skim milk or low-fat yogurt. ?Fill one fourth of your plate at each meal with low-fat (lean) proteins. Low-fat proteins include fish, chicken without skin, eggs, beans, and tofu. ?Avoid fatty meat, cured and processed meat, or chicken with skin. ?Avoid pre-made or processed food. ?Limit the amount of salt in your diet to less than 1,500 mg each day. ?Do not drink alcohol if: ?Your doctor tells you not to drink. ?You are pregnant, may be pregnant, or are planning to become pregnant. ?If you drink alcohol: ?Limit how much you have to: ?0-1 drink a day for women. ?0-2 drinks a day for men. ?Know how much alcohol is in your drink. In the [place] one drink equals one 12 oz bottle of beer (355 mL), one 5 oz glass of wine (148 mL), or one 1? oz glass of hard liquor (44 mL). ?Lifestyle ? ?Work with your doctor to stay at a healthy weight or to lose weight. Ask your doctor what the best weight is for you. ?Get at least 30 minutes of exercise that causes your heart to beat faster (aerobic exercise) most days of the week. This may include walking, swimming, or biking. ?Get at least 30 minutes of exercise that strengthens your muscles (resistance exercise) at least 3 days a week. This may include lifting weights or doing Pilates. ?Do not smoke or use any products that contain nicotine or tobacco. If you need help quitting, ask your doctor. ?Check your blood pressure at home as told by your doctor. ?Keep all follow-up visits. ?Medicines ?Take over-the-counter and prescription medicines   only as told by your doctor. Follow directions carefully. ?Do not skip doses of blood pressure medicine. The medicine does not work as well if you skip doses. Skipping doses also puts you at risk for problems. ?Ask your doctor about side effects or reactions to medicines that you should watch  for. ?Contact a doctor if: ?You think you are having a reaction to the medicine you are taking. ?You have headaches that keep coming back. ?You feel dizzy. ?You have swelling in your ankles. ?You have trouble with your vision. ?Get help right away if: ?You get a very bad headache. ?You start to feel mixed up (confused). ?You feel weak or numb. ?You feel faint. ?You have very bad pain in your: ?Chest. ?Belly (abdomen). ?You vomit more than once. ?You have trouble breathing. ?These symptoms may be an emergency. Get help right away. Call 911. ?Do not wait to see if the symptoms will go away. ?Do not drive yourself to the hospital. ?Summary ?Hypertension is another name for high blood pressure. ?High blood pressure forces your heart to work harder to pump blood. ?For most people, a normal blood pressure is less than 120/80. ?Making healthy choices can help lower blood pressure. If your blood pressure does not get lower with healthy choices, you may need to take medicine. ?This information is not intended to replace advice given to you by your health care provider. Make sure you discuss any questions you have with your health care provider. ?Document Revised: 06/22/2021 Document Reviewed: 06/22/2021 ?Elsevier Patient Education ? 2023 Elsevier [employer] ? ?

## 2022-04-17 ENCOUNTER — Other Ambulatory Visit: Payer: Self-pay | Admitting: Nurse Practitioner

## 2022-05-08 ENCOUNTER — Encounter: Payer: Self-pay | Admitting: Nurse Practitioner

## 2022-05-08 ENCOUNTER — Ambulatory Visit: Payer: BC Managed Care – PPO | Admitting: Nurse Practitioner

## 2022-05-08 VITALS — BP 120/70 | HR 83 | Temp 98.4°F | Ht 65.0 in | Wt 191.2 lb

## 2022-05-08 DIAGNOSIS — F419 Anxiety disorder, unspecified: Secondary | ICD-10-CM

## 2022-05-08 DIAGNOSIS — Z6831 Body mass index (BMI) 31.0-31.9, adult: Secondary | ICD-10-CM

## 2022-05-08 DIAGNOSIS — E6609 Other obesity due to excess calories: Secondary | ICD-10-CM

## 2022-05-08 DIAGNOSIS — I10 Essential (primary) hypertension: Secondary | ICD-10-CM

## 2022-05-08 DIAGNOSIS — H04123 Dry eye syndrome of bilateral lacrimal glands: Secondary | ICD-10-CM

## 2022-05-08 DIAGNOSIS — I7090 Unspecified atherosclerosis: Secondary | ICD-10-CM

## 2022-05-08 DIAGNOSIS — R7303 Prediabetes: Secondary | ICD-10-CM | POA: Diagnosis not present

## 2022-05-08 MED ORDER — SAXENDA 18 MG/3ML ~~LOC~~ SOPN: 3.0000 mg | PEN_INJECTOR | Freq: Every day | SUBCUTANEOUS | 1 refills | Status: DC

## 2022-05-08 NOTE — Patient Instructions (Signed)

## 2022-05-08 NOTE — Progress Notes (Signed)
Barnet Glasgow Martin,acting as a Education administrator for [name], FNP.,have documented all relevant documentation on the behalf of [name], FNP,as directed by  [name], FNP while in the presence of [name], [place].    Subjective:     Patient ID: Lisa Casey , female    DOB: 06-20-1961 , 61 y.o.   MRN: [id number]   Chief Complaint  Patient presents with   Follow-up    Medication    HPI  Patient presents today for a medication follow up, patient states compliance with medications. Patient states the metformin is doing okay and better than the regular metformn, but she wants to add another one to help with weight loss.   She has tried phentermine in the past and Saxenda which was effective however had insurance coverage challenges. She had been exercising but has stopped recently.   Patient states she is having issues with her eyes, she states they are getting very sticky and she has went to the eye doctor but nothing helps.  Patient declined shingrix today.   BP Readings from Last 3 Encounters: 05/08/22 : 120/70 03/27/22 : 122/88 03/23/22 : 122/86       Past Medical History:  Diagnosis Date   Anxiety    HX OF ANXIETY ATTACKS   Back pain, chronic    Bronchitis    Cholelithiasis 04/15/2021   Diabetes ([place])    started Metformin in 02/2021   Fibromyalgia    GERD (gastroesophageal reflux disease)    History of kidney stones    Hypertension    Migraines    HX OF MIGRAINES   Myocardial infarction [facility])    july 2022   Obesity    Pneumonia    06-2021   Shortened PR interval      Family History  Problem Relation Age of Onset   Breast cancer Mother 66   Breast cancer Paternal Grandmother    Heart failure Neg Hx    Coronary artery disease Neg Hx      Current Outpatient Medications:    acetaminophen (TYLENOL) 500 MG tablet, Take 1,000 mg by mouth every 6 (six) hours as needed for moderate pain or headache., Disp: , Rfl:    albuterol (VENTOLIN HFA) 108 (90 Base)  MCG/ACT inhaler, Inhale 2 puffs into the lungs every 6 (six) hours as needed for wheezing or shortness of breath., Disp: , Rfl:    amLODipine (NORVASC) 5 MG tablet, Take 1 tablet (5 mg total) by mouth daily., Disp: 90 tablet, Rfl: 3   aspirin EC 81 MG EC tablet, Take 1 tablet (81 mg total) by mouth daily. Swallow whole., Disp: 30 tablet, Rfl: 11   atorvastatin (LIPITOR) 10 MG tablet, Take 1 tab by mouth MWF, Disp: 45 tablet, Rfl: 1   Blood Glucose Monitoring Suppl (ONETOUCH VERIO) w/Device KIT, Use as directed to check blood sugars 2 times per day dx: e11.65, Disp: 1 kit, Rfl: 1   busPIRone (BUSPAR) 5 MG tablet, Take 1 tablet (5 mg total) by mouth 2 (two) times daily., Disp: 60 tablet, Rfl: 2   glucose blood (ONETOUCH VERIO) test strip, Use as directed to check blood sugars 2 times per day dx: e11.65, Disp: 100 each, Rfl: 5   levocetirizine (XYZAL) 5 MG tablet, TAKE 1 TABLET BY MOUTH ONCE DAILY IN THE EVENING, Disp: 90 tablet, Rfl: 0   Liraglutide -Weight Management (SAXENDA) 18 MG/3ML SOPN, Inject 3 mg into the skin daily., Disp: 15 mL, Rfl: 1   metFORMIN (GLUCOPHAGE-XR) 750  MG 24 hr tablet, Take 1 tablet (750 mg total) by mouth daily with breakfast., Disp: 30 tablet, Rfl: 2   Multiple Vitamin (MULTIVITAMIN WITH MINERALS) TABS tablet, Take 1 tablet by mouth daily., Disp: , Rfl:    nitroGLYCERIN (NITROSTAT) 0.4 MG SL tablet, Place 1 tablet (0.4 mg total) under the tongue every 5 (five) minutes as needed for chest pain., Disp: 25 tablet, Rfl: 4   OneTouch Delica Lancets 48A MISC, Use as directed to check blood sugars 2 times per day dx: e11.65, Disp: 100 each, Rfl: 5   pregabalin (LYRICA) 150 MG capsule, Take 1 capsule by mouth twice daily, Disp: 180 capsule, Rfl: 0   clopidogrel (PLAVIX) 75 MG tablet, Take 1 tablet by mouth once daily (Patient not taking: Reported on 05/08/2022), Disp: 30 tablet, Rfl: 0   hydrOXYzine (VISTARIL) 25 MG capsule, TAKE 1 CAPSULE BY MOUTH THREE TIMES DAILY AS NEEDED FOR  ANXIETY (Patient not taking: Reported on 05/08/2022), Disp: 90 capsule, Rfl: 0   metoprolol tartrate (LOPRESSOR) 25 MG tablet, Take 1/2 (one-half) tablet by mouth twice daily (Patient not taking: Reported on 05/08/2022), Disp: 60 tablet, Rfl: 0   Allergies  Allergen Reactions   Codeine Itching     Review of Systems  Constitutional: Negative.   HENT: Negative.    Eyes: Negative.   Respiratory: Negative.    Cardiovascular: Negative.   Gastrointestinal: Negative.   Psychiatric/Behavioral: Negative.       Today's Vitals   05/08/22 1032  BP: 120/70  Pulse: 83  Temp: 98.4 F (36.9 C)  TempSrc: Oral  Weight: 191 lb 3.2 oz (86.7 kg)  Height: $Remove'5\' 5"'mAnvCHi$  (1.651 m)  PainSc: 0-No pain   Body mass index is 31.82 kg/m.  Wt Readings from Last 3 Encounters:  05/08/22 191 lb 3.2 oz (86.7 kg)  03/27/22 191 lb (86.6 kg)  03/23/22 186 lb 6.4 oz (84.6 kg)     Objective:  Physical Exam Vitals reviewed.  Constitutional:      General: She is not in acute distress.    Appearance: Normal appearance. She is obese.  Cardiovascular:     Rate and Rhythm: Normal rate and regular rhythm.     Pulses: Normal pulses.     Heart sounds: Normal heart sounds. No murmur heard. Pulmonary:     Effort: Pulmonary effort is normal. No respiratory distress.     Breath sounds: Normal breath sounds. No wheezing.  Skin:    General: Skin is warm and dry.     Capillary Refill: Capillary refill takes less than 2 seconds.  Neurological:     General: No focal deficit present.     Mental Status: She is alert and oriented to person, place, and time.     Cranial Nerves: No cranial nerve deficit.     Motor: No weakness.  Psychiatric:        Mood and Affect: Mood normal.        Behavior: Behavior normal.        Thought Content: Thought content normal.        Cognition and Memory: Cognition normal.        Judgment: Judgment normal.         Assessment And Plan:     1. Essential hypertension Comments: Blood  pressure is well controlled, continue current medications.  2. Anxiety Comments: Better controlled, continue current medications.   3. Prediabetes Comments: Has not had a HgbA1c above 6.4.   4. Atherosclerosis Comments: Continue statin, tolerating well  5. Dry eyes, bilateral Comments: Encouraged to drink more water at least 64 oz a day, no signs of bulging eyes. most recent TSH was normal  6. Class 1 obesity due to excess calories without serious comorbidity with body mass index (BMI) of 31.0 to 31.9 in adult we have started Saxenda, discussed side effects to include nausea, difficulty swallowing and abdominal pain. She is to titrate weekly as tolerated. Goal to lose 10% body weight in 4 months if approved by insurance, she is to call once she picks up medication for in person education. She is encouraged to continue exercising at least 150 minutes a week and eating a healthy diet - Liraglutide -Weight Management (SAXENDA) 18 MG/3ML SOPN; Inject 3 mg into the skin daily.  Dispense: 15 mL; Refill: 1   I personally spent 25 minutes face-to-face and non-face-to-face in the care of this patient, which includes all pre-, intra-, and post visit time on the date of service.  Patient was given opportunity to ask questions. Patient verbalized understanding of the plan and was able to repeat key elements of the plan. All questions were answered to their satisfaction.  [name], FNP   I, [name], FNP, have reviewed all documentation for this visit. The documentation on 05/13/22 for the exam, diagnosis, procedures, and orders are all accurate and complete.   IF YOU HAVE BEEN REFERRED TO A SPECIALIST, IT MAY TAKE 1-2 WEEKS TO SCHEDULE/PROCESS THE REFERRAL. IF YOU HAVE NOT HEARD FROM US/SPECIALIST IN TWO WEEKS, PLEASE GIVE [place] A CALL AT [phone].   THE PATIENT IS ENCOURAGED TO PRACTICE SOCIAL DISTANCING DUE TO THE COVID-19 PANDEMIC.

## 2022-05-13 DIAGNOSIS — E6609 Other obesity due to excess calories: Secondary | ICD-10-CM | POA: Insufficient documentation

## 2022-05-13 DIAGNOSIS — H04123 Dry eye syndrome of bilateral lacrimal glands: Secondary | ICD-10-CM | POA: Insufficient documentation

## 2022-05-13 DIAGNOSIS — I7090 Unspecified atherosclerosis: Secondary | ICD-10-CM | POA: Insufficient documentation

## 2022-05-15 ENCOUNTER — Telehealth: Payer: Self-pay

## 2022-05-15 NOTE — Telephone Encounter (Signed)
PA completed for saxenda using CMM

## 2022-05-26 ENCOUNTER — Encounter (HOSPITAL_BASED_OUTPATIENT_CLINIC_OR_DEPARTMENT_OTHER): Payer: Self-pay | Admitting: Emergency Medicine

## 2022-05-26 ENCOUNTER — Emergency Department (HOSPITAL_BASED_OUTPATIENT_CLINIC_OR_DEPARTMENT_OTHER): Payer: BC Managed Care – PPO

## 2022-05-26 ENCOUNTER — Observation Stay (HOSPITAL_BASED_OUTPATIENT_CLINIC_OR_DEPARTMENT_OTHER)
Admission: EM | Admit: 2022-05-26 | Discharge: 2022-05-28 | Disposition: A | Discharge status: Home / Self Care | Payer: BC Managed Care – PPO | Attending: Internal Medicine | Admitting: Internal Medicine

## 2022-05-26 ENCOUNTER — Other Ambulatory Visit: Payer: Self-pay

## 2022-05-26 ENCOUNTER — Encounter (HOSPITAL_COMMUNITY): Payer: Self-pay

## 2022-05-26 DIAGNOSIS — R1013 Epigastric pain: Secondary | ICD-10-CM | POA: Insufficient documentation

## 2022-05-26 DIAGNOSIS — R109 Unspecified abdominal pain: Secondary | ICD-10-CM | POA: Diagnosis present

## 2022-05-26 DIAGNOSIS — Z7902 Long term (current) use of antithrombotics/antiplatelets: Secondary | ICD-10-CM | POA: Insufficient documentation

## 2022-05-26 DIAGNOSIS — R911 Solitary pulmonary nodule: Secondary | ICD-10-CM | POA: Insufficient documentation

## 2022-05-26 DIAGNOSIS — Z87891 Personal history of nicotine dependence: Secondary | ICD-10-CM | POA: Diagnosis not present

## 2022-05-26 DIAGNOSIS — E119 Type 2 diabetes mellitus without complications: Secondary | ICD-10-CM | POA: Diagnosis not present

## 2022-05-26 DIAGNOSIS — I10 Essential (primary) hypertension: Secondary | ICD-10-CM | POA: Diagnosis not present

## 2022-05-26 DIAGNOSIS — K8071 Calculus of gallbladder and bile duct without cholecystitis with obstruction: Secondary | ICD-10-CM | POA: Insufficient documentation

## 2022-05-26 DIAGNOSIS — K8051 Calculus of bile duct without cholangitis or cholecystitis with obstruction: Principal | ICD-10-CM

## 2022-05-26 DIAGNOSIS — K8020 Calculus of gallbladder without cholecystitis without obstruction: Secondary | ICD-10-CM | POA: Diagnosis present

## 2022-05-26 DIAGNOSIS — R1011 Right upper quadrant pain: Principal | ICD-10-CM | POA: Insufficient documentation

## 2022-05-26 DIAGNOSIS — Z7982 Long term (current) use of aspirin: Secondary | ICD-10-CM | POA: Diagnosis not present

## 2022-05-26 DIAGNOSIS — Z7984 Long term (current) use of oral hypoglycemic drugs: Secondary | ICD-10-CM | POA: Diagnosis not present

## 2022-05-26 DIAGNOSIS — I2510 Atherosclerotic heart disease of native coronary artery without angina pectoris: Secondary | ICD-10-CM | POA: Insufficient documentation

## 2022-05-26 DIAGNOSIS — Z9049 Acquired absence of other specified parts of digestive tract: Secondary | ICD-10-CM | POA: Diagnosis present

## 2022-05-26 DIAGNOSIS — Z79899 Other long term (current) drug therapy: Secondary | ICD-10-CM | POA: Diagnosis not present

## 2022-05-26 DIAGNOSIS — K8050 Calculus of bile duct without cholangitis or cholecystitis without obstruction: Secondary | ICD-10-CM | POA: Diagnosis present

## 2022-05-26 DIAGNOSIS — R7303 Prediabetes: Secondary | ICD-10-CM | POA: Diagnosis present

## 2022-05-26 HISTORY — DX: Unspecified abdominal pain: R10.9

## 2022-05-26 LAB — CBC WITH DIFFERENTIAL/PLATELET
Abs Immature Granulocytes: 0.02 10*3/uL (ref 0.00–0.07)
Basophils Absolute: 0 10*3/uL (ref 0.0–0.1)
Basophils Relative: 1 %
Eosinophils Absolute: 0.1 10*3/uL (ref 0.0–0.5)
Eosinophils Relative: 1 %
HCT: 41.7 % (ref 36.0–46.0)
Hemoglobin: 14 g/dL (ref 12.0–15.0)
Immature Granulocytes: 0 %
Lymphocytes Relative: 31 %
Lymphs Abs: 2.2 10*3/uL (ref 0.7–4.0)
MCH: 30.3 pg (ref 26.0–34.0)
MCHC: 33.6 g/dL (ref 30.0–36.0)
MCV: 90.3 fL (ref 80.0–100.0)
Monocytes Absolute: 0.4 10*3/uL (ref 0.1–1.0)
Monocytes Relative: 5 %
Neutro Abs: 4.2 10*3/uL (ref 1.7–7.7)
Neutrophils Relative %: 62 %
Platelets: 256 10*3/uL (ref 150–400)
RBC: 4.62 MIL/uL (ref 3.87–5.11)
RDW: 12.1 % (ref 11.5–15.5)
WBC: 6.9 10*3/uL (ref 4.0–10.5)
nRBC: 0 % (ref 0.0–0.2)

## 2022-05-26 LAB — COMPREHENSIVE METABOLIC PANEL
ALT: 75 U/L — ABNORMAL HIGH (ref 0–44)
AST: 170 U/L — ABNORMAL HIGH (ref 15–41)
Albumin: 4.1 g/dL (ref 3.5–5.0)
Alkaline Phosphatase: 68 U/L (ref 38–126)
Anion gap: 8 (ref 5–15)
BUN: 13 mg/dL (ref 8–23)
CO2: 27 mmol/L (ref 22–32)
Calcium: 9.7 mg/dL (ref 8.9–10.3)
Chloride: 107 mmol/L (ref 98–111)
Creatinine, Ser: 0.82 mg/dL (ref 0.44–1.00)
GFR, Estimated: >60 mL/min (ref >60)
Glucose, Bld: 121 mg/dL — ABNORMAL HIGH (ref 70–99)
Potassium: 3.8 mmol/L (ref 3.5–5.1)
Sodium: 142 mmol/L (ref 135–145)
Total Bilirubin: 1.2 mg/dL (ref 0.3–1.2)
Total Protein: 7.4 g/dL (ref 6.5–8.1)

## 2022-05-26 LAB — LIPASE, BLOOD: Lipase: 39 U/L (ref 11–51)

## 2022-05-26 MED ORDER — MORPHINE SULFATE (PF) 4 MG/ML IV SOLN
4.0000 mg | Freq: Once | INTRAVENOUS | Status: AC
  Administered 2022-05-26: 4 mg via INTRAVENOUS
  Filled 2022-05-26: qty 1

## 2022-05-26 MED ORDER — LACTATED RINGERS IV BOLUS
1000.0000 mL | Freq: Once | INTRAVENOUS | Status: AC
Start: 2022-05-26 — End: 2022-05-26
  Administered 2022-05-26: 1000 mL via INTRAVENOUS

## 2022-05-26 MED ORDER — HYDROMORPHONE HCL 1 MG/ML IJ SOLN
1.0000 mg | Freq: Once | INTRAMUSCULAR | Status: AC
  Administered 2022-05-26: 1 mg via INTRAVENOUS
  Filled 2022-05-26: qty 1

## 2022-05-26 MED ORDER — ONDANSETRON HCL 4 MG/2ML IJ SOLN
4.0000 mg | Freq: Once | INTRAMUSCULAR | Status: AC
  Administered 2022-05-26: 4 mg via INTRAVENOUS
  Filled 2022-05-26: qty 2

## 2022-05-26 NOTE — ED Triage Notes (Signed)
Sudden onset epigastric pain after eating pizza this morning. Pt states she was "supposed to have her gall bladder out years ago". Denies other sx.

## 2022-05-26 NOTE — ED Provider Notes (Signed)
[place] EMERGENCY DEPARTMENT Provider Note   CSN: [id number] Arrival date & time: 05/26/22  1930     History  Chief Complaint  Patient presents with   Abdominal Pain    Lisa Casey is a 61 y.o. female.  Patient is a 61 year old female with a history of diabetes, hypertension, cholelithiasis, MI, kidney stones who is presenting today with severe abdominal pain that is been present since 11:00 this morning.  Patient reports she ate a piece of pizza and shortly after developed severe epigastric/right upper quadrant pain that intermittently radiates into her back.  The pain has only worsened since it has started.  She denies intermittent history of abdominal pain after eating and reports in her 31s she was told she had gallstones but she never had it taken out.  She has had nausea without vomiting today and denies any diarrhea.  She denies any urinary symptoms.  She tried taking Pepto-Bismol and Pepcid at home earlier today without any improvement in her symptoms.  The history is provided by the patient.  Abdominal Pain Pain location:  Epigastric and RUQ Pain quality: gnawing, sharp and shooting   Associated symptoms: no fever        Home Medications Prior to Admission medications   Medication Sig Start Date End Date Taking? Authorizing Provider  acetaminophen (TYLENOL) 500 MG tablet Take 1,000 mg by mouth every 6 (six) hours as needed for moderate pain or headache.    [provider]  albuterol (VENTOLIN HFA) 108 (90 Base) MCG/ACT inhaler Inhale 2 puffs into the lungs every 6 (six) hours as needed for wheezing or shortness of breath.    [provider]  amLODipine (NORVASC) 5 MG tablet Take 1 tablet (5 mg total) by mouth daily. 12/19/21   [name], FNP  aspirin EC 81 MG EC tablet Take 1 tablet (81 mg total) by mouth daily. Swallow whole. 04/18/21   Hosie Poisson, MD  atorvastatin (LIPITOR) 10 MG tablet Take 1 tab by mouth MWF 12/19/21   [name], FNP  Blood Glucose Monitoring Suppl (ONETOUCH VERIO) w/Device KIT Use as directed to check blood sugars 2 times per day dx: e11.65 04/20/21   Bary Castilla, NP  busPIRone (BUSPAR) 5 MG tablet Take 1 tablet (5 mg total) by mouth 2 (two) times daily. 03/27/22   [name], FNP  clopidogrel (PLAVIX) 75 MG tablet Take 1 tablet by mouth once daily Patient not taking: Reported on 05/08/2022 04/17/22   [name], FNP  glucose blood (ONETOUCH VERIO) test strip Use as directed to check blood sugars 2 times per day dx: e11.65 04/20/21   Bary Castilla, NP  hydrOXYzine (VISTARIL) 25 MG capsule TAKE 1 CAPSULE BY MOUTH THREE TIMES DAILY AS NEEDED FOR ANXIETY Patient not taking: Reported on 05/08/2022 03/05/22   [name], FNP  levocetirizine (XYZAL) 5 MG tablet TAKE 1 TABLET BY MOUTH ONCE DAILY IN THE EVENING 03/10/22   [name], FNP  Liraglutide -Weight Management (SAXENDA) 18 MG/3ML SOPN Inject 3 mg into the skin daily. 05/08/22   [name], FNP  metFORMIN (GLUCOPHAGE-XR) 750 MG 24 hr tablet Take 1 tablet (750 mg total) by mouth daily with breakfast. 03/27/22   [name], FNP  metoprolol tartrate (LOPRESSOR) 25 MG tablet Take 1/2 (one-half) tablet by mouth twice daily Patient not taking: Reported on 05/08/2022 12/19/21   [name], FNP  Multiple Vitamin (MULTIVITAMIN WITH MINERALS) TABS tablet Take 1 tablet by mouth daily.    [provider]  nitroGLYCERIN (NITROSTAT) 0.4 MG SL tablet Place 1 tablet (0.4 mg total) under the tongue every 5 (five) minutes as needed for chest pain. 03/23/22   Freada Bergeron, MD  OneTouch Delica Lancets 46T MISC Use as directed to check blood sugars 2 times per day dx: e11.65 04/20/21   Bary Castilla, NP  pregabalin (LYRICA) 150 MG capsule Take 1 capsule by mouth twice daily 03/05/22   [name], FNP      Allergies    Codeine    Review of Systems   Review of Systems  Constitutional:  Negative for fever.  Gastrointestinal:   Positive for abdominal pain.    Physical Exam Updated Vital Signs BP 117/77   Pulse 63   Resp (!) 21   Ht _0  (1.651 m)   Wt 88.5 kg   LMP 08/04/2011   SpO2 98%   BMI 32.45 kg/m  Physical Exam Vitals and nursing note reviewed.  Constitutional:      Appearance: She is well-developed.     Comments: Appears uncomfortable  HENT:     Head: Normocephalic and atraumatic.  Eyes:     Pupils: Pupils are equal, round, and reactive to light.  Cardiovascular:     Rate and Rhythm: Normal rate and regular rhythm.     Heart sounds: Normal heart sounds. No murmur heard.    No friction rub.  Pulmonary:     Effort: Pulmonary effort is normal.     Breath sounds: Normal breath sounds. No wheezing or rales.  Abdominal:     General: Bowel sounds are normal. There is no distension.     Palpations: Abdomen is soft.     Tenderness: There is abdominal tenderness in the right upper quadrant and epigastric area. There is guarding. There is no right CVA tenderness, left CVA tenderness or rebound. Positive signs include Murphy's sign.  Musculoskeletal:        General: No tenderness. Normal range of motion.     Comments: No edema  Skin:    General: Skin is warm and dry.     Findings: No rash.  Neurological:     Mental Status: She is alert and oriented to person, place, and time.     Cranial Nerves: No cranial nerve deficit.  Psychiatric:        Behavior: Behavior normal.     ED Results / Procedures / Treatments   Labs (all labs ordered are listed, but only abnormal results are displayed) Labs Reviewed  COMPREHENSIVE METABOLIC PANEL - Abnormal; Notable for the following components:      Result Value   Glucose, Bld 121 (*)    AST 170 (*)    ALT 75 (*)    All other components within normal limits  CBC WITH DIFFERENTIAL/PLATELET  LIPASE, BLOOD    EKG EKG Interpretation  Date/Time:  Saturday May 26 2022 19:43:16 EDT Ventricular Rate:  75 PR Interval:  108 QRS Duration: 81 QT  Interval:  478 QTC Calculation: 534 R Axis:   57 Text Interpretation: Sinus rhythm Short PR interval RSR' in V1 or V2, probably normal variant Borderline repolarization abnormality new  Prolonged QT interval Confirmed by Blanchie Dessert [phone]) on 05/26/2022 7:57:22 PM  Radiology US Abdomen Limited RUQ (LIVER/GB)  Result Date: 05/26/2022 CLINICAL DATA:  Right upper quadrant pain.  Nausea and vomiting. EXAM: ULTRASOUND ABDOMEN LIMITED RIGHT UPPER QUADRANT COMPARISON:  None Available. FINDINGS: Gallbladder: Several tiny gallstones are noted. No evidence of gallbladder wall thickening or pericholecystic fluid. Common  bile duct: Diameter: 8 mm, which is mildly dilated. Liver: No focal lesion identified. Within normal limits in parenchymal echogenicity. Portal vein is patent on color Doppler imaging with normal direction of blood flow towards the liver. Other: None. IMPRESSION: Cholelithiasis.  No signs of cholecystitis. Mild biliary ductal dilatation, with common bile duct measuring 8 mm. Recommend correlation with liver function tests, and consider MRCP for further evaluation if clinically warranted. Electronically Signed   By: Marlaine Hind M.D.   On: 05/26/2022 20:44    Procedures Procedures    Medications Ordered in ED Medications  lactated ringers bolus 1,000 mL (0 mLs Intravenous Stopped 05/26/22 2309)  ondansetron (ZOFRAN) injection 4 mg (4 mg Intravenous Given 05/26/22 1954)  morphine (PF) 4 MG/ML injection 4 mg (4 mg Intravenous Given 05/26/22 1955)  HYDROmorphone (DILAUDID) injection 1 mg (1 mg Intravenous Given 05/26/22 2255)    ED Course/ Medical Decision Making/ A&P                           Medical Decision Making Amount and/or Complexity of Data Reviewed External Data Reviewed: notes. Labs: ordered. Decision-making details documented in ED Course. Radiology: ordered and independent interpretation performed. Decision-making details documented in ED Course.  Risk Prescription drug  management. Decision regarding hospitalization.   Pt with multiple medical problems and comorbidities and presenting today with a complaint that caries a high risk for morbidity and mortality.  Here today complaining of upper abdominal pain that has now been present for approximately 9 hours.  She has a prior history of Coley lithiasis but denies recurrent pain with this.  She denies any upper respiratory symptoms and low suspicion at this time for pneumonia or MI.  Concern for cholecystitis versus choledocholithiasis versus pancreatitis or PUD or perforated ulcer.  Lower suspicion for pyelonephritis, renal stone diverticulitis or appendicitis.  Patient given pain and nausea control.  Labs and imaging are pending. 11:37 PM I independently interpreted patient's EKG and labs.  EKG with a new prolonged QT but no other acute findings, CBC is within normal limits, CMP is normal except for new elevated LFTs with an AST of 170 and an ALT of 75 and normal total bilirubin.  Lipase is within normal limits.  Right upper quadrant ultrasound is pending.  11:37 PM I have independently visualized and interpreted pt's images today.  RUQ with signs of gallstones.  Radiologist reports cholelithiasis without signs of cholecystitis however patient has mild biliary duct dilatation with common bile duct measuring 8 mm and recommend correlating with LFTs and MRCP.  Patient does have new elevated LFTs today compared to her baseline.  Concern for common bile duct stone.  Her pain is improved with pain medication but not completely gone.  Attempted to consult GI but nobody has called back.  We will discuss with the hospitalist this feel that patient will need an MRCP and most likely surgical consult in the future.          Final Clinical Impression(s) / ED Diagnoses Final diagnoses:  Calculus of bile duct without cholecystitis with obstruction    Rx / DC Orders ED Discharge Orders     None         Blanchie Dessert, MD 05/26/22 2337

## 2022-05-27 ENCOUNTER — Encounter (HOSPITAL_COMMUNITY): Payer: Self-pay | Admitting: Internal Medicine

## 2022-05-27 ENCOUNTER — Inpatient Hospital Stay (HOSPITAL_COMMUNITY): Payer: BC Managed Care – PPO

## 2022-05-27 DIAGNOSIS — I10 Essential (primary) hypertension: Secondary | ICD-10-CM | POA: Diagnosis not present

## 2022-05-27 DIAGNOSIS — K8020 Calculus of gallbladder without cholecystitis without obstruction: Secondary | ICD-10-CM

## 2022-05-27 DIAGNOSIS — R1011 Right upper quadrant pain: Secondary | ICD-10-CM | POA: Diagnosis not present

## 2022-05-27 DIAGNOSIS — K8070 Calculus of gallbladder and bile duct without cholecystitis without obstruction: Secondary | ICD-10-CM | POA: Diagnosis not present

## 2022-05-27 DIAGNOSIS — R7303 Prediabetes: Secondary | ICD-10-CM | POA: Diagnosis not present

## 2022-05-27 DIAGNOSIS — K838 Other specified diseases of biliary tract: Secondary | ICD-10-CM

## 2022-05-27 LAB — AMYLASE: Amylase: 235 U/L — ABNORMAL HIGH (ref 28–100)

## 2022-05-27 LAB — GLUCOSE, CAPILLARY
Glucose-Capillary: 106 mg/dL — ABNORMAL HIGH (ref 70–99)
Glucose-Capillary: 99 mg/dL (ref 70–99)
Glucose-Capillary: 91 mg/dL (ref 70–99)

## 2022-05-27 LAB — LIPASE, BLOOD: Lipase: 35 U/L (ref 11–51)

## 2022-05-27 MED ORDER — ALBUTEROL SULFATE (2.5 MG/3ML) 0.083% IN NEBU: 2.5000 mg | INHALATION_SOLUTION | Freq: Four times a day (QID) | RESPIRATORY_TRACT | Status: DC | PRN

## 2022-05-27 MED ORDER — LORATADINE 10 MG PO TABS
10.0000 mg | ORAL_TABLET | Freq: Every evening | ORAL | Status: DC
  Administered 2022-05-27: 10 mg via ORAL
  Filled 2022-05-27: qty 1

## 2022-05-27 MED ORDER — PREGABALIN 75 MG PO CAPS
150.0000 mg | ORAL_CAPSULE | Freq: Two times a day (BID) | ORAL | Status: DC
  Administered 2022-05-27 – 2022-05-28 (×3): 150 mg via ORAL
  Filled 2022-05-27 (×3): qty 2

## 2022-05-27 MED ORDER — BUSPIRONE HCL 5 MG PO TABS
5.0000 mg | ORAL_TABLET | Freq: Two times a day (BID) | ORAL | Status: DC
  Administered 2022-05-27 – 2022-05-28 (×3): 5 mg via ORAL
  Filled 2022-05-27 (×3): qty 1

## 2022-05-27 MED ORDER — ONDANSETRON HCL 4 MG/2ML IJ SOLN
4.0000 mg | Freq: Once | INTRAMUSCULAR | Status: AC
  Administered 2022-05-27: 4 mg via INTRAVENOUS
  Filled 2022-05-27: qty 2

## 2022-05-27 MED ORDER — HYDROMORPHONE HCL 1 MG/ML IJ SOLN: 1.0000 mg | INTRAMUSCULAR | Status: DC | PRN

## 2022-05-27 MED ORDER — GADOBUTROL 1 MMOL/ML IV SOLN
7.5000 mL | Freq: Once | INTRAVENOUS | Status: AC | PRN
Start: 2022-05-27 — End: 2022-05-27
  Administered 2022-05-27: 7.5 mL via INTRAVENOUS

## 2022-05-27 MED ORDER — HYDROXYZINE PAMOATE 25 MG PO CAPS
25.0000 mg | ORAL_CAPSULE | Freq: Three times a day (TID) | ORAL | Status: DC | PRN
Start: 2022-05-27 — End: 2022-05-28

## 2022-05-27 MED ORDER — SODIUM CHLORIDE 0.45 % IV SOLN: INTRAVENOUS | Status: DC

## 2022-05-27 MED ORDER — NITROGLYCERIN 0.4 MG SL SUBL
0.4000 mg | SUBLINGUAL_TABLET | SUBLINGUAL | Status: DC | PRN
Start: 2022-05-27 — End: 2022-05-28

## 2022-05-27 MED ORDER — ATORVASTATIN CALCIUM 10 MG PO TABS
10.0000 mg | ORAL_TABLET | ORAL | Status: DC
  Administered 2022-05-28: 10 mg via ORAL
  Filled 2022-05-27: qty 1

## 2022-05-27 MED ORDER — LIRAGLUTIDE -WEIGHT MANAGEMENT 18 MG/3ML ~~LOC~~ SOPN
3.0000 mg | PEN_INJECTOR | Freq: Every day | SUBCUTANEOUS | Status: DC
Start: 2022-05-27 — End: 2022-05-27

## 2022-05-27 MED ORDER — ALPRAZOLAM 0.5 MG PO TABS
0.5000 mg | ORAL_TABLET | Freq: Once | ORAL | Status: AC
  Administered 2022-05-27: 0.5 mg via ORAL
  Filled 2022-05-27: qty 1

## 2022-05-27 MED ORDER — INSULIN ASPART 100 UNIT/ML IJ SOLN: 0.0000 [IU] | INTRAMUSCULAR | Status: DC

## 2022-05-27 MED ORDER — AMLODIPINE BESYLATE 5 MG PO TABS
5.0000 mg | ORAL_TABLET | Freq: Every day | ORAL | Status: DC
  Administered 2022-05-27 – 2022-05-28 (×2): 5 mg via ORAL
  Filled 2022-05-27 (×2): qty 1

## 2022-05-27 MED ORDER — MECLIZINE HCL 25 MG PO TABS: 25.0000 mg | ORAL_TABLET | Freq: Once | ORAL | Status: DC

## 2022-05-27 MED ORDER — TRAZODONE HCL 50 MG PO TABS
25.0000 mg | ORAL_TABLET | Freq: Every evening | ORAL | Status: DC | PRN
  Administered 2022-05-27: 25 mg via ORAL
  Filled 2022-05-27: qty 1

## 2022-05-27 MED ORDER — HEPARIN SODIUM (PORCINE) 5000 UNIT/ML IJ SOLN
5000.0000 [IU] | Freq: Three times a day (TID) | INTRAMUSCULAR | Status: DC
  Administered 2022-05-27 – 2022-05-28 (×5): 5000 [IU] via SUBCUTANEOUS
  Filled 2022-05-27 (×5): qty 1

## 2022-05-27 MED ORDER — KETOROLAC TROMETHAMINE 30 MG/ML IJ SOLN
30.0000 mg | Freq: Four times a day (QID) | INTRAMUSCULAR | Status: DC
  Administered 2022-05-27 – 2022-05-28 (×4): 30 mg via INTRAVENOUS
  Filled 2022-05-27 (×5): qty 1

## 2022-05-27 MED ORDER — MORPHINE SULFATE (PF) 4 MG/ML IV SOLN: 4.0000 mg | INTRAVENOUS | Status: DC | PRN

## 2022-05-27 NOTE — Assessment & Plan Note (Addendum)
Patient with prior finding of cholethiasis which but she did not have continued symptoms or pursue surgery. NOw with recurrent colic and elevated LFTs with mild dilation CBD. Also with epigastric pain with radiation to the back suggestive of pancreatitis.  Plan Support: pain control  NPO except sip/chips/meds  Hold  ASA, Plavix  Lipase and Amylase levels  MRCP  GI consult - [name] consulted by [place] - they are aware of the patient - possible surgical intervention this hospitalization.

## 2022-05-27 NOTE — Assessment & Plan Note (Signed)
RUQ abdominal pain most likely from Cholithiasis. No evidence of infection.  Plan Pain control - ketorolac, for unrelieved pain MS

## 2022-05-27 NOTE — Assessment & Plan Note (Signed)
BP controlled.  Plan - continue present meds

## 2022-05-27 NOTE — H&P (Signed)
History and Physical    Lisa Casey EVM:[id number] DOB: 01/13/1961 DOA: 05/26/2022  DOS: the patient was seen and examined on 05/26/2022  PCP: [name], FNP   Patient coming from: Home - transferred from [facility]  I have personally briefly reviewed patient's old medical records in [facility]  Lisa Casey, a 61 y/o withprior h/o cholilithiasis, DM, HTN, SAD had the onset last night of severe RUQ abdominal pain that was progressively worse ovenight. She presented to [facility] for evaluation which revealed elevated AST,ALT and cholithiasis with CBD at 63mm. Labs were otherwise normal. She was accepted in transfer to [facility].   ED Course: Vital signs stable. EDP exam positive for RUQ tenderness. Lab - Glucose 121, AST 170, ALT 75. Abd U/S with gallstone and CBD 8 mm. Patient accepted in transfer for GI and surgical consults and treatment. TRH to manage symptoms and medical condition  Review of Systems:  Review of Systems  Constitutional:  Negative for chills and fever.  HENT: Negative.    Eyes: Negative.   Respiratory: Negative.    Cardiovascular: Negative.   Gastrointestinal:  Positive for abdominal pain, nausea and vomiting. Negative for heartburn.  Genitourinary: Negative.   Musculoskeletal: Negative.   Skin: Negative.   Neurological: Negative.   Endo/Heme/Allergies: Negative.   Psychiatric/Behavioral: Negative.      Past Medical History:  Diagnosis Date   Anxiety    HX OF ANXIETY ATTACKS   Back pain, chronic    Bronchitis    Cholelithiasis 04/15/2021   Diabetes (HCC)    started Metformin in 02/2021   Fibromyalgia    GERD (gastroesophageal reflux disease)    History of kidney stones    Hypertension    Migraines    HX OF MIGRAINES   Myocardial infarction [facility])    july 2022   Obesity    Pneumonia    06-2021   Shortened PR interval     Past Surgical History:  Procedure Laterality Date   CONSTIPATION     CYSTOSCOPY W/ URETERAL STENT PLACEMENT  09/23/2012   Procedure:  CYSTOSCOPY WITH RETROGRADE PYELOGRAM/URETERAL STENT PLACEMENT;  Surgeon: Sebastian Ache, MD;  Location: [facility];  Service: Urology;  Laterality: Right;   CYSTOSCOPY WITH RETROGRADE PYELOGRAM, URETEROSCOPY AND STENT PLACEMENT  10/08/2012   Procedure: CYSTOSCOPY WITH RETROGRADE PYELOGRAM, URETEROSCOPY AND STENT PLACEMENT;  Surgeon: Sebastian Ache, MD;  Location: [facility];  Service: Urology;  Laterality: Right;  90 MIN NO RETROGRADE NEEDS FLEX URETEROSCOPE    CYSTOSCOPY/URETEROSCOPY/HOLMIUM LASER/STENT PLACEMENT Right 08/21/2021   Procedure: CYSTOSCOPY RIGHT /URETEROSCOPY/HOLMIUM LASER/STENT PLACEMENT;  Surgeon: Crista Elliot, MD;  Location: [facility];  Service: Urology;  Laterality: Right;   HOLMIUM LASER APPLICATION  10/08/2012   Procedure: HOLMIUM LASER APPLICATION;  Surgeon: Sebastian Ache, MD;  Location: [facility];  Service: Urology;  Laterality: Right;   LEFT HEART CATH AND CORONARY ANGIOGRAPHY N/A 04/17/2021   Procedure: LEFT HEART CATH AND CORONARY ANGIOGRAPHY;  Surgeon: Kathleene Hazel, MD;  Location: [facility];  Service: Cardiovascular;  Laterality: N/A;   LEFT HEART CATHETERIZATION WITH CORONARY ANGIOGRAM N/A 10/29/2011   Procedure: LEFT HEART CATHETERIZATION WITH CORONARY ANGIOGRAM;  Surgeon: Marykay Lex, MD;  Location: [facility] CATH LAB;  Service: Cardiovascular;  Laterality: N/A;   ROBOTIC ASSISTED LAP VAGINAL HYSTERECTOMY  09/14/2011   Procedure: ROBOTIC ASSISTED LAPAROSCOPIC VAGINAL HYSTERECTOMY;  Surgeon: Roseanna Rainbow, MD;  Location: [facility];  Service: Gynecology;  Laterality: N/A;   TUBAL LIGATION  Soc Hx - Marriage #1 - divorced, Marriage #2 - divorced, Marariage #3 - 1 year and happy. She has one son and one daughter. Work - drives a school bus.    reports that she quit smoking about 26 years ago. Her smoking use included cigarettes. She has a 2.50 pack-year smoking history. She has never used smokeless tobacco. She reports  that she does not drink alcohol and does not use drugs.  Allergies  Allergen Reactions   Codeine Itching    Family History  Problem Relation Age of Onset   Breast cancer Mother 62   Breast cancer Paternal Grandmother    Heart failure Neg Hx    Coronary artery disease Neg Hx     Prior to Admission medications   Medication Sig Start Date End Date Taking? Authorizing Provider  acetaminophen (TYLENOL) 500 MG tablet Take 1,000 mg by mouth every 6 (six) hours as needed for moderate pain or headache.   Yes [provider]  albuterol (VENTOLIN HFA) 108 (90 Base) MCG/ACT inhaler Inhale 2 puffs into the lungs every 6 (six) hours as needed for wheezing or shortness of breath.   Yes [provider]  amLODipine (NORVASC) 5 MG tablet Take 1 tablet (5 mg total) by mouth daily. 12/19/21  Yes [name], FNP  aspirin EC 81 MG EC tablet Take 1 tablet (81 mg total) by mouth daily. Swallow whole. 04/18/21  Yes Kathlen Mody, MD  atorvastatin (LIPITOR) 10 MG tablet Take 1 tab by mouth MWF 12/19/21  Yes [name], FNP  busPIRone (BUSPAR) 5 MG tablet Take 1 tablet (5 mg total) by mouth 2 (two) times daily. 03/27/22  Yes [name], FNP  levocetirizine (XYZAL) 5 MG tablet TAKE 1 TABLET BY MOUTH ONCE DAILY IN THE EVENING Patient taking differently: Take 5 mg by mouth every evening. 03/10/22  Yes [name], FNP  Liraglutide -Weight Management (SAXENDA) 18 MG/3ML SOPN Inject 3 mg into the skin daily. 05/08/22  Yes [name], FNP  metFORMIN (GLUCOPHAGE-XR) 750 MG 24 hr tablet Take 1 tablet (750 mg total) by mouth daily with breakfast. 03/27/22  Yes [name], FNP  Multiple Vitamin (MULTIVITAMIN WITH MINERALS) TABS tablet Take 1 tablet by mouth daily.   Yes [provider]  nitroGLYCERIN (NITROSTAT) 0.4 MG SL tablet Place 1 tablet (0.4 mg total) under the tongue every 5 (five) minutes as needed for chest pain. 03/23/22  Yes Meriam Sprague, MD  pregabalin (LYRICA) 150 MG  capsule Take 1 capsule by mouth twice daily 03/05/22  Yes [name], FNP  Blood Glucose Monitoring Suppl (ONETOUCH VERIO) w/Device KIT Use as directed to check blood sugars 2 times per day dx: e11.65 04/20/21   Charlesetta Ivory, NP  clopidogrel (PLAVIX) 75 MG tablet Take 1 tablet by mouth once daily Patient not taking: Reported on 05/08/2022 04/17/22   [name], FNP  cycloSPORINE (RESTASIS) 0.05 % ophthalmic emulsion Place 1 drop into both eyes daily as needed (dry eye).    [provider]  glucose blood (ONETOUCH VERIO) test strip Use as directed to check blood sugars 2 times per day dx: e11.65 04/20/21   Charlesetta Ivory, NP  hydrOXYzine (VISTARIL) 25 MG capsule TAKE 1 CAPSULE BY MOUTH THREE TIMES DAILY AS NEEDED FOR ANXIETY Patient not taking: Reported on 05/08/2022 03/05/22   [name], FNP  metoprolol tartrate (LOPRESSOR) 25 MG tablet Take 1/2 (one-half) tablet by mouth twice daily Patient not taking: Reported on 05/08/2022 12/19/21   [name], FNP  OneTouch Delica Lancets 62G MISC Use as directed to check blood sugars 2 times per day dx: e11.65 04/20/21   Bary Castilla, NP    Physical Exam: Vitals:   05/27/22 0256 05/27/22 0359 05/27/22 0814 05/27/22 0816  BP:  138/77 137/77   Pulse:  66 64   Resp:  18 20   Temp: 98.2 F (36.8 C) (!) 97.3 F (36.3 C) 98.4 F (36.9 C) 98.4 F (36.9 C)  TempSrc: Oral Oral Oral   SpO2: 94% 99% 97%   Weight:  72.6 kg    Height:        Physical Exam Constitutional:      Appearance: She is well-developed.     Comments: overweight  HENT:     Head: Normocephalic and atraumatic.     Mouth/Throat:     Mouth: Mucous membranes are moist.  Eyes:     Extraocular Movements: Extraocular movements intact.     Pupils: Pupils are equal, round, and reactive to light.  Cardiovascular:     Rate and Rhythm: Normal rate and regular rhythm.     Heart sounds: Normal heart sounds.     Comments: Very prominent S2 at RSB Pulmonary:      Effort: Pulmonary effort is normal.     Breath sounds: Normal breath sounds.  Abdominal:     General: Abdomen is protuberant. Bowel sounds are decreased. There is no distension.     Palpations: Abdomen is soft.     Tenderness: There is abdominal tenderness in the right upper quadrant and epigastric area. There is no guarding or rebound. Positive signs include Murphy's sign.     Hernia: No hernia is present.     Comments: Very tender to percussion RUQ, very tender to deep palpation across epigastrum with radiation to the back.   Skin:    General: Skin is warm and dry.  Neurological:     General: No focal deficit present.     Mental Status: She is alert and oriented to person, place, and time.  Psychiatric:        Mood and Affect: Mood normal.        Behavior: Behavior normal.      Labs on Admission: I have personally reviewed following labs and imaging studies  CBC: Recent Labs  Lab 05/26/22 1950  WBC 6.9  NEUTROABS 4.2  HGB 14.0  HCT 41.7  MCV 90.3  PLT 315   Basic Metabolic Panel: Recent Labs  Lab 05/26/22 1950  NA 142  K 3.8  CL 107  CO2 27  GLUCOSE 121*  BUN 13  CREATININE 0.82  CALCIUM 9.7   GFR: Estimated Creatinine Clearance: 71.9 mL/min (by C-G formula based on SCr of 0.82 mg/dL). Liver Function Tests: Recent Labs  Lab 05/26/22 1950  AST 170*  ALT 75*  ALKPHOS 68  BILITOT 1.2  PROT 7.4  ALBUMIN 4.1   Recent Labs  Lab 05/26/22 1950  LIPASE 39   No results for input(s): "AMMONIA" in the last 168 hours. Coagulation Profile: No results for input(s): "INR", "PROTIME" in the last 168 hours. Cardiac Enzymes: No results for input(s): "CKTOTAL", "CKMB", "CKMBINDEX", "TROPONINI" in the last 168 hours. BNP (last 3 results) No results for input(s): "PROBNP" in the last 8760 hours. HbA1C: No results for input(s): "HGBA1C" in the last 72 hours. CBG: No results for input(s): "GLUCAP" in the last 168 hours. Lipid Profile: No results for input(s):  "CHOL", "HDL", "LDLCALC", "TRIG", "CHOLHDL", "LDLDIRECT" in the last 72 hours. Thyroid  Function Tests: No results for input(s): "TSH", "T4TOTAL", "FREET4", "T3FREE", "THYROIDAB" in the last 72 hours. Anemia Panel: No results for input(s): "VITAMINB12", "FOLATE", "FERRITIN", "TIBC", "IRON", "RETICCTPCT" in the last 72 hours. Urine analysis:    Component Value Date/Time   COLORURINE YELLOW 06/08/2016 0835   APPEARANCEUR HAZY (A) 06/08/2016 0835   LABSPEC 1.026 06/08/2016 0835   PHURINE 6.0 06/08/2016 0835   GLUCOSEU NEGATIVE 06/08/2016 0835   HGBUR NEGATIVE 06/08/2016 0835   BILIRUBINUR negative 06/29/2021 Lupus 06/08/2016 0835   PROTEINUR Negative 06/29/2021 1204   PROTEINUR NEGATIVE 06/08/2016 0835   UROBILINOGEN 0.2 06/29/2021 1204   UROBILINOGEN 1.0 10/28/2013 1924   NITRITE negative 06/29/2021 1204   NITRITE NEGATIVE 06/08/2016 0835   LEUKOCYTESUR Small (1+) (A) 06/29/2021 1204    Radiological Exams on Admission: I have personally reviewed images US Abdomen Limited RUQ (LIVER/GB)  Result Date: 05/26/2022 CLINICAL DATA:  Right upper quadrant pain.  Nausea and vomiting. EXAM: ULTRASOUND ABDOMEN LIMITED RIGHT UPPER QUADRANT COMPARISON:  None Available. FINDINGS: Gallbladder: Several tiny gallstones are noted. No evidence of gallbladder wall thickening or pericholecystic fluid. Common bile duct: Diameter: 8 mm, which is mildly dilated. Liver: No focal lesion identified. Within normal limits in parenchymal echogenicity. Portal vein is patent on color Doppler imaging with normal direction of blood flow towards the liver. Other: None. IMPRESSION: Cholelithiasis.  No signs of cholecystitis. Mild biliary ductal dilatation, with common bile duct measuring 8 mm. Recommend correlation with liver function tests, and consider MRCP for further evaluation if clinically warranted. Electronically Signed   By: Marlaine Hind M.D.   On: 05/26/2022 20:44    EKG: I have personally  reviewed EKG: Sinus Rhythm, prolonged QT, no acute changes  Assessment/Plan Principal Problem:   Abdominal pain Active Problems:   Essential hypertension   Prediabetes   Cholelithiasis    Assessment and Plan: * Abdominal pain RUQ abdominal pain most likely from Cholithiasis. No evidence of infection.  Plan Pain control - ketorolac, for unrelieved pain MS  Cholelithiasis Patient with prior finding of cholethiasis which but she did not have continued symptoms or pursue surgery. NOw with recurrent colic and elevated LFTs with mild dilation CBD. Also with epigastric pain with radiation to the back suggestive of pancreatitis.  Plan Support: pain control  NPO except sip/chips/meds  Hold  ASA, Plavix  Lipase and Amylase levels  MRCP  GI consult - [name] consulted by [place] - they are aware of the patient - possible surgical intervention this hospitalization.   Prediabetes Last A1C 12/26/21 6.2%. Patient on metformin.  liraglutide -DM and weight loss- ordered but not started.   Plan Hold metform  SS coverage  Essential hypertension BP controlled.  Plan - continue present meds       DVT prophylaxis: SQ Heparin Code Status: Full Code Family Communication: spoke with Stan Head, daughter.  Disposition Plan: TBD  Consults called: General surgery; GI - [name]  Admission status: Inpatient, Med-Surg   Adella Hare, MD [facility] 05/27/2022, 9:43 AM

## 2022-05-27 NOTE — Progress Notes (Signed)
Patient arrived to room 3E06 from [facility].  Assessment complete, VS obtained, and Admission database began.

## 2022-05-27 NOTE — Consult Note (Addendum)
Consult Note  Lisa Casey May 05, 1961  [id number].    Requesting MD: Dr. Linda Hedges Chief Complaint/Reason for Consult: biliary colic  HPI:  61 y.o. female with medical history significant for DM, HTN, fibromyalgia who presented to [facility] ED with acute onset severe abdominal pain on 9/9. Pain began that day after eating pizza and was located in the RUQ and epigastrium radiating into her back. She has had nausea as well but no emesis. She has had similar symptoms once before in her 76s and was diagnosed with cholelithiasis at that time.   Work up in ED was significant for US showing Cholelithiasis, no signs of cholecystitis, mild biliary ductal dilatation, with common bile duct measuring 8 mm.  This morning her RUQ and nausea have resolved. She still has mild epigastric pain but improved from yesterday  She is a prior cigarette smoker. She denies alcohol and other substance use. Prior abdominal surgeries include laparoscopic hysterectomy  She has history of NSTEMI and was on plavix previously but completed therapy in July   ROS: Review of Systems  Constitutional:  Negative for chills and fever.  Respiratory:  Negative for cough and shortness of breath.   Cardiovascular:  Negative for chest pain and palpitations.  Gastrointestinal:  Positive for abdominal pain and nausea. Negative for constipation, diarrhea and vomiting.    Family History  Problem Relation Age of Onset   Breast cancer Mother 91   Breast cancer Paternal Grandmother    Heart failure Neg Hx    Coronary artery disease Neg Hx     Past Medical History:  Diagnosis Date   Anxiety    HX OF ANXIETY ATTACKS   Back pain, chronic    Bronchitis    Cholelithiasis 04/15/2021   Diabetes ([place])    started Metformin in 02/2021   Fibromyalgia    GERD (gastroesophageal reflux disease)    History of kidney stones    Hypertension    Migraines    HX OF MIGRAINES   Myocardial infarction [facility])    july 2022    Obesity    Pneumonia    06-2021   Shortened PR interval     Past Surgical History:  Procedure Laterality Date   CONSTIPATION     CYSTOSCOPY W/ URETERAL STENT PLACEMENT  09/23/2012   Procedure: CYSTOSCOPY WITH RETROGRADE PYELOGRAM/URETERAL STENT PLACEMENT;  Surgeon: Alexis Frock, MD;  Location: [facility];  Service: Urology;  Laterality: Right;   CYSTOSCOPY WITH RETROGRADE PYELOGRAM, URETEROSCOPY AND STENT PLACEMENT  10/08/2012   Procedure: CYSTOSCOPY WITH RETROGRADE PYELOGRAM, URETEROSCOPY AND STENT PLACEMENT;  Surgeon: Alexis Frock, MD;  Location: [facility];  Service: Urology;  Laterality: Right;  90 MIN NO RETROGRADE NEEDS FLEX URETEROSCOPE    CYSTOSCOPY/URETEROSCOPY/HOLMIUM LASER/STENT PLACEMENT Right 08/21/2021   Procedure: CYSTOSCOPY RIGHT /URETEROSCOPY/HOLMIUM LASER/STENT PLACEMENT;  Surgeon: Lucas Mallow, MD;  Location: [facility];  Service: Urology;  Laterality: Right;   HOLMIUM LASER APPLICATION  5/62/5638   Procedure: HOLMIUM LASER APPLICATION;  Surgeon: Alexis Frock, MD;  Location: [facility];  Service: Urology;  Laterality: Right;   LEFT HEART CATH AND CORONARY ANGIOGRAPHY N/A 04/17/2021   Procedure: LEFT HEART CATH AND CORONARY ANGIOGRAPHY;  Surgeon: Burnell Blanks, MD;  Location: [facility];  Service: Cardiovascular;  Laterality: N/A;   LEFT HEART CATHETERIZATION WITH CORONARY ANGIOGRAM N/A 10/29/2011   Procedure: LEFT HEART CATHETERIZATION WITH CORONARY ANGIOGRAM;  Surgeon: Leonie Man, MD;  Location: [facility];  Service: Cardiovascular;  Laterality: N/A;   ROBOTIC ASSISTED LAP VAGINAL HYSTERECTOMY  09/14/2011   Procedure: ROBOTIC ASSISTED LAPAROSCOPIC VAGINAL HYSTERECTOMY;  Surgeon: Agnes Lawrence, MD;  Location: [facility];  Service: Gynecology;  Laterality: N/A;   TUBAL LIGATION      Social History:  reports that she quit smoking about 26 years ago. Her smoking use included cigarettes. She has a 2.50 pack-year  smoking history. She has never used smokeless tobacco. She reports that she does not drink alcohol and does not use drugs.  Allergies:  Allergies  Allergen Reactions   Codeine Itching    Medications Prior to Admission  Medication Sig Dispense Refill   acetaminophen (TYLENOL) 500 MG tablet Take 1,000 mg by mouth every 6 (six) hours as needed for moderate pain or headache.     albuterol (VENTOLIN HFA) 108 (90 Base) MCG/ACT inhaler Inhale 2 puffs into the lungs every 6 (six) hours as needed for wheezing or shortness of breath.     amLODipine (NORVASC) 5 MG tablet Take 1 tablet (5 mg total) by mouth daily. 90 tablet 3   aspirin EC 81 MG EC tablet Take 1 tablet (81 mg total) by mouth daily. Swallow whole. 30 tablet 11   atorvastatin (LIPITOR) 10 MG tablet Take 1 tab by mouth MWF 45 tablet 1   Blood Glucose Monitoring Suppl (ONETOUCH VERIO) w/Device KIT Use as directed to check blood sugars 2 times per day dx: e11.65 1 kit 1   busPIRone (BUSPAR) 5 MG tablet Take 1 tablet (5 mg total) by mouth 2 (two) times daily. 60 tablet 2   clopidogrel (PLAVIX) 75 MG tablet Take 1 tablet by mouth once daily (Patient not taking: Reported on 05/08/2022) 30 tablet 0   glucose blood (ONETOUCH VERIO) test strip Use as directed to check blood sugars 2 times per day dx: e11.65 100 each 5   hydrOXYzine (VISTARIL) 25 MG capsule TAKE 1 CAPSULE BY MOUTH THREE TIMES DAILY AS NEEDED FOR ANXIETY (Patient not taking: Reported on 05/08/2022) 90 capsule 0   levocetirizine (XYZAL) 5 MG tablet TAKE 1 TABLET BY MOUTH ONCE DAILY IN THE EVENING 90 tablet 0   Liraglutide -Weight Management (SAXENDA) 18 MG/3ML SOPN Inject 3 mg into the skin daily. 15 mL 1   metFORMIN (GLUCOPHAGE-XR) 750 MG 24 hr tablet Take 1 tablet (750 mg total) by mouth daily with breakfast. 30 tablet 2   metoprolol tartrate (LOPRESSOR) 25 MG tablet Take 1/2 (one-half) tablet by mouth twice daily (Patient not taking: Reported on 05/08/2022) 60 tablet 0   Multiple  Vitamin (MULTIVITAMIN WITH MINERALS) TABS tablet Take 1 tablet by mouth daily.     nitroGLYCERIN (NITROSTAT) 0.4 MG SL tablet Place 1 tablet (0.4 mg total) under the tongue every 5 (five) minutes as needed for chest pain. 25 tablet 4   OneTouch Delica Lancets 26S MISC Use as directed to check blood sugars 2 times per day dx: e11.65 100 each 5   pregabalin (LYRICA) 150 MG capsule Take 1 capsule by mouth twice daily 180 capsule 0    Blood pressure 138/77, pulse 66, temperature (!) 97.3 F (36.3 C), temperature source Oral, resp. rate 18, height $RemoveBe'5\' 5"'XIpEgCVrr$  (1.651 m), weight 72.6 kg, last menstrual period 08/04/2011, SpO2 99 %. Physical Exam: General: pleasant, WD, female who is laying in bed in NAD Heart: regular, rate, and rhythm.  Normal s1,s2. No obvious murmurs, gallops, or rubs noted.  Palpable radial and pedal pulses bilaterally Lungs: CTAB, no wheezes, rhonchi, or rales  noted.  Respiratory effort nonlabored Abd: soft, ND, +BS, no masses, hernias, or organomegaly. Mild TTP in epigastrium without rebound or guarding. No RUQ and negative murphy's sign MSK: all 4 extremities are symmetrical with no cyanosis, clubbing, or edema. Skin: warm and dry with no masses, lesions, or rashes Neuro: Cranial nerves 2-12 grossly intact, sensation is normal throughout Psych: A&Ox3 with an appropriate affect.    Results for orders placed or performed during the hospital encounter of 05/26/22 (from the past 48 hour(s))  CBC with Differential/Platelet     Status: None   Collection Time: 05/26/22  7:50 PM  Result Value Ref Range   WBC 6.9 4.0 - 10.5 K/uL   RBC 4.62 3.87 - 5.11 MIL/uL   Hemoglobin 14.0 12.0 - 15.0 g/dL   HCT 41.7 36.0 - 46.0 %   MCV 90.3 80.0 - 100.0 fL   MCH 30.3 26.0 - 34.0 pg   MCHC 33.6 30.0 - 36.0 g/dL   RDW 12.1 11.5 - 15.5 %   Platelets 256 150 - 400 K/uL   nRBC 0.0 0.0 - 0.2 %   Neutrophils Relative % 62 %   Neutro Abs 4.2 1.7 - 7.7 K/uL   Lymphocytes Relative 31 %   Lymphs Abs  2.2 0.7 - 4.0 K/uL   Monocytes Relative 5 %   Monocytes Absolute 0.4 0.1 - 1.0 K/uL   Eosinophils Relative 1 %   Eosinophils Absolute 0.1 0.0 - 0.5 K/uL   Basophils Relative 1 %   Basophils Absolute 0.0 0.0 - 0.1 K/uL   Immature Granulocytes 0 %   Abs Immature Granulocytes 0.02 0.00 - 0.07 K/uL    Comment: Performed at [facility], [address] [place], [place] [address]  Comprehensive metabolic panel     Status: Abnormal   Collection Time: 05/26/22  7:50 PM  Result Value Ref Range   Sodium 142 135 - 145 mmol/L   Potassium 3.8 3.5 - 5.1 mmol/L   Chloride 107 98 - 111 mmol/L   CO2 27 22 - 32 mmol/L   Glucose, Bld 121 (H) 70 - 99 mg/dL    Comment: Glucose reference range applies only to samples taken after fasting for at least 8 hours.   BUN 13 8 - 23 mg/dL   Creatinine, Ser 0.82 0.44 - 1.00 mg/dL   Calcium 9.7 8.9 - 10.3 mg/dL   Total Protein 7.4 6.5 - 8.1 g/dL   Albumin 4.1 3.5 - 5.0 g/dL   AST 170 (H) 15 - 41 U/L   ALT 75 (H) 0 - 44 U/L   Alkaline Phosphatase 68 38 - 126 U/L   Total Bilirubin 1.2 0.3 - 1.2 mg/dL   GFR, Estimated >60 >60 mL/min    Comment: (NOTE) Calculated using the CKD-EPI Creatinine Equation (2021)    Anion gap 8 5 - 15    Comment: Performed at [facility], [address] [place], [place] [address]  Lipase, blood     Status: None   Collection Time: 05/26/22  7:50 PM  Result Value Ref Range   Lipase 39 11 - 51 U/L    Comment: Performed at [facility], [address] [place], [place] [address]   US Abdomen Limited RUQ (LIVER/GB)  Result Date: 05/26/2022 CLINICAL DATA:  Right upper quadrant pain.  Nausea and vomiting. EXAM: ULTRASOUND ABDOMEN LIMITED RIGHT UPPER QUADRANT COMPARISON:  None Available. FINDINGS: Gallbladder: Several tiny gallstones are noted. No evidence of gallbladder  wall thickening or pericholecystic fluid. Common bile duct: Diameter: 8 mm, which is mildly dilated. Liver: No focal lesion identified.  Within normal limits in parenchymal echogenicity. Portal vein is patent on color Doppler imaging with normal direction of blood flow towards the liver. Other: None. IMPRESSION: Cholelithiasis.  No signs of cholecystitis. Mild biliary ductal dilatation, with common bile duct measuring 8 mm. Recommend correlation with liver function tests, and consider MRCP for further evaluation if clinically warranted. Electronically Signed   By: Marlaine Hind M.D.   On: 05/26/2022 20:44      Assessment/Plan Cholelithiasis - US showing Cholelithiasis, no signs of cholecystitis, mild biliary ductal dilatation, with common bile duct measuring 8 mm. - LFTs minimally elevated. RUQ pain resolved. Mild epigastric pain but lipase normal - possible she may have passed a stone. GI has been consulted as well - follow MRCP results - no indication for urgent surgical intervention - do not think she has cholecystitis. Await GI reccs and MRCP results but patient likely with biliary colic and possible passed stone. Can consider elective cholecystectomy this admission vs outpatient pending symptom improvement and diet tolerance.  FEN: NPO ID: None indicated from surgical perspective VTE: Heparin subq  Per primary: Prediabetes Hypertension Fibromyalgia  I reviewed ED provider notes, hospitalist notes, last 24 h vitals and pain scores, last 48 h intake and output, last 24 h labs and trends, and last 24 h imaging results.    Winferd Humphrey, [facility] 05/27/2022, 7:55 AM Please see Amion for pager number during day hours 7:00am-4:30pm

## 2022-05-27 NOTE — Assessment & Plan Note (Addendum)
Last A1C 12/26/21 6.2%. Patient on metformin.  liraglutide -DM and weight loss- ordered but not started.   Plan Hold metform  SS coverage

## 2022-05-27 NOTE — Consult Note (Addendum)
Consultation  Covering for Dr. Adriana Casey  Referring Provider:   [facility] Primary Care Physician:  Lisa Casey, [place] Primary Gastroenterologist:  [place] (Lisa Casey or Dr. Collene Casey)       Reason for Consultation:    RUQ pain, elevated LFTs   Attending physician's note  I have taken a history, reviewed the chart and examined the patient. I performed a substantive portion of this encounter, including complete performance of at least one of the key components, in conjunction with the APP. I agree with the APP's note, impression and recommendations.    61 year old female with history of CAD on 81 mg aspirin admitted with elevated LFT and right upper quadrant discomfort She is hemodynamically stable with no leukocytosis  She is off Plavix since July 2022  Right upper quadrant ultrasound with cholelithiasis, no acute cholecystitis, dilated CBD but no definite stones Follow-up MRCP to evaluate for choledocholithiasis, if positive plan for ERCP, timing per Lisa Casey  Lisa Casey will resume care of the patient and round on her tomorrow  Please call GI with any questions or change in clinical status  The patient was provided an opportunity to ask questions and all were answered. The patient agreed with the plan and demonstrated an understanding of the instructions.  Lisa Casey , MD [phone]      Impression    61 year old female with history of coronary artery disease on bASA only, known cholelithiasis presents with acute abdominal pain associate with nausea and vomiting.   WBC 6.9 HGB 14.0 Platelets 256 AST 170 ALT 75  Alkphos 68 TBili 1.2 04/16/2021 INR 1.0  Found to have elevated LFTs.   No elevation of white blood cell count, no signs of cholangitis Ultrasound with gallstones, no acute cholecystitis, CBD 8 mm.  CAD 2022 Stopped Plavix July of last year has only been on low-dose aspirin.  Type 2 diabetes     Plan   - Daily CBC, CMET -Continue supportive  care -Continue pain control. -Continue IV hydration, can do clear liquids today, n.p.o. at midnight. -Plan for ERCP if MRCP shows choledocholithiasis, timing of ERCP per Lisa Casey. -Surgery has been consulted, with longstanding history of symptomatic cholelithiasis, will need to be considered for cholecystectomy this admission versus outpatient pending MRCP -I thoroughly discussed procedure with the patient to include nature, alternatives, benefits, and risks (including but not limited to post ERCP pancreatitis, bleeding, infection, perforation, anesthesia/cardiac pulmonary complications). Discussed risk of pancreatitis associated with ERCP. Patient verbalized understanding and gave verbal consent to proceed with ERCP.  Thank you for your kind consultation, we will continue to follow.         HPI:   Lisa Casey is a 61 y.o. female with past medical history significant for hypertension, diabetes, GERD, myocardial infarction July 2022 with normal cardiac catheterization 04/17/2021 patient on ASA only, cholelithiasis, presented to [facility] ER with right upper quadrant abdominal pain progressively worsening overnight associated nausea and vomiting.  Patient states she has been having issues with her gallbladder since her 63s.  After having her daughter she was actually scheduled for surgery but had a poor reaction to her medication prior and postponed it.  Never rescheduled. Since that time she always had an issue with fatty foods, some abdominal discomfort but this would resolve quickly. Yesterday however after eating pizza patient had acute right upper quadrant abdominal pain with radiating to mid back with nausea no vomiting. Got progressively worse so she presented to the  ER. Denies any fever or chills.  No family history of gallbladder disease. Denies ever having yellowing of eyes or skin, no dark urine.  In the ER AST 170, ALT 75 Don[place] ultrasound showed gallstones with CBD 8 mm.  Pending  MRCP. Patient afebrile, no leukocytosis.   Lying in bed comfortable, denies abdominal pain, nausea or vomiting.  Patient states she is up-to-date with her colonoscopy at 1 this year last year with Lisa Casey. Had normal bowel movements, denies hematochezia or melena. Denies alcohol use, drug use, tobacco use. Per patient had MI last year was on Plavix/clopidogrel up until July and states she has not had any since that time.  Abnormal ED labs: Abnormal Labs Reviewed  COMPREHENSIVE METABOLIC PANEL - Abnormal; Notable for the following components:      Result Value   Glucose, Bld 121 (*)    AST 170 (*)    ALT 75 (*)    All other components within normal limits     Past Medical History:  Diagnosis Date   Anxiety    HX OF ANXIETY ATTACKS   Back pain, chronic    Bronchitis    Cholelithiasis 04/15/2021   Diabetes ([place])    started Metformin in 02/2021   Fibromyalgia    GERD (gastroesophageal reflux disease)    History of kidney stones    Hypertension    Migraines    HX OF MIGRAINES   Myocardial infarction [facility])    july 2022   Obesity    Pneumonia    06-2021   Shortened PR interval     Surgical History:  She  has a past surgical history that includes Tubal ligation; CONSTIPATION; Robotic assisted lap vaginal hysterectomy (09/14/2011); Cystoscopy w/ ureteral stent placement (09/23/2012); Cystoscopy with retrograde pyelogram, ureteroscopy and stent placement (10/08/2012); Holmium laser application (8/33/8250); left heart catheterization with coronary angiogram (N/A, 10/29/2011); LEFT HEART CATH AND CORONARY ANGIOGRAPHY (N/A, 04/17/2021); and Cystoscopy/ureteroscopy/holmium laser/stent placement (Right, 08/21/2021). Family History:  Her family history includes Breast cancer in her paternal grandmother; Breast cancer (age of onset: 74) in her mother. Social History:   reports that she quit smoking about 26 years ago. Her smoking use included cigarettes. She has a 2.50 pack-year smoking  history. She has never used smokeless tobacco. She reports that she does not drink alcohol and does not use drugs.  Prior to Admission medications   Medication Sig Start Date End Date Taking? Authorizing Provider  acetaminophen (TYLENOL) 500 MG tablet Take 1,000 mg by mouth every 6 (six) hours as needed for moderate pain or headache.   Yes [provider]  albuterol (VENTOLIN HFA) 108 (90 Base) MCG/ACT inhaler Inhale 2 puffs into the lungs every 6 (six) hours as needed for wheezing or shortness of breath.   Yes [provider]  amLODipine (NORVASC) 5 MG tablet Take 1 tablet (5 mg total) by mouth daily. 12/19/21  Yes Lisa Brine, FNP  aspirin EC 81 MG EC tablet Take 1 tablet (81 mg total) by mouth daily. Swallow whole. 04/18/21  Yes Hosie Poisson, MD  atorvastatin (LIPITOR) 10 MG tablet Take 1 tab by mouth MWF 12/19/21  Yes Lisa Brine, FNP  busPIRone (BUSPAR) 5 MG tablet Take 1 tablet (5 mg total) by mouth 2 (two) times daily. 03/27/22  Yes Lisa Brine, FNP  levocetirizine (XYZAL) 5 MG tablet TAKE 1 TABLET BY MOUTH ONCE DAILY IN THE EVENING Patient taking differently: Take 5 mg by mouth every evening. 03/10/22  Yes Lisa Brine, FNP  Liraglutide -  Weight Management (SAXENDA) 18 MG/3ML SOPN Inject 3 mg into the skin daily. 05/08/22  Yes Lisa Brine, FNP  metFORMIN (GLUCOPHAGE-XR) 750 MG 24 hr tablet Take 1 tablet (750 mg total) by mouth daily with breakfast. 03/27/22  Yes Lisa Brine, FNP  Multiple Vitamin (MULTIVITAMIN WITH MINERALS) TABS tablet Take 1 tablet by mouth daily.   Yes [provider]  nitroGLYCERIN (NITROSTAT) 0.4 MG SL tablet Place 1 tablet (0.4 mg total) under the tongue every 5 (five) minutes as needed for chest pain. 03/23/22  Yes Freada Bergeron, MD  pregabalin (LYRICA) 150 MG capsule Take 1 capsule by mouth twice daily 03/05/22  Yes Lisa Brine, FNP  Blood Glucose Monitoring Suppl (ONETOUCH VERIO) w/Device KIT Use as directed to check blood sugars 2  times per day dx: e11.65 04/20/21   Bary Castilla, NP  clopidogrel (PLAVIX) 75 MG tablet Take 1 tablet by mouth once daily Patient not taking: Reported on 05/08/2022 04/17/22   Lisa Brine, FNP  cycloSPORINE (RESTASIS) 0.05 % ophthalmic emulsion Place 1 drop into both eyes daily as needed (dry eye).    [provider]  glucose blood (ONETOUCH VERIO) test strip Use as directed to check blood sugars 2 times per day dx: e11.65 04/20/21   Bary Castilla, NP  hydrOXYzine (VISTARIL) 25 MG capsule TAKE 1 CAPSULE BY MOUTH THREE TIMES DAILY AS NEEDED FOR ANXIETY Patient not taking: Reported on 05/08/2022 03/05/22   Lisa Brine, FNP  metoprolol tartrate (LOPRESSOR) 25 MG tablet Take 1/2 (one-half) tablet by mouth twice daily Patient not taking: Reported on 05/08/2022 12/19/21   Lisa Brine, FNP  OneTouch Delica Lancets 69C MISC Use as directed to check blood sugars 2 times per day dx: e11.65 04/20/21   Bary Castilla, NP    Current Facility-Administered Medications  Medication Dose Route Frequency Provider Last Rate Last Admin   0.45 % sodium chloride infusion   Intravenous Continuous Norins, Heinz Knuckles, MD       albuterol (PROVENTIL) (2.5 MG/3ML) 0.083% nebulizer solution 2.5 mg  2.5 mg Inhalation Q6H PRN Norins, Heinz Knuckles, MD       amLODipine (NORVASC) tablet 5 mg  5 mg Oral Daily Norins, Heinz Knuckles, MD       [START ON 05/28/2022] atorvastatin (LIPITOR) tablet 10 mg  10 mg Oral Q M,W,F Norins, Heinz Knuckles, MD       busPIRone (BUSPAR) tablet 5 mg  5 mg Oral BID Norins, Heinz Knuckles, MD       heparin injection 5,000 Units  5,000 Units Subcutaneous Q8H Norins, Heinz Knuckles, MD       HYDROmorphone (DILAUDID) injection 1 mg  1 mg Intravenous Q3H PRN Blanchie Dessert, MD       hydrOXYzine (VISTARIL) capsule 25 mg  25 mg Oral TID PRN Norins, Heinz Knuckles, MD       insulin aspart (novoLOG) injection 0-15 Units  0-15 Units Subcutaneous Q4H Norins, Heinz Knuckles, MD       ketorolac (TORADOL) 30 MG/ML injection 30  mg  30 mg Intravenous Q6H Norins, Heinz Knuckles, MD       loratadine (CLARITIN) tablet 10 mg  10 mg Oral QPM Norins, Heinz Knuckles, MD       morphine (PF) 4 MG/ML injection 4 mg  4 mg Intravenous Q3H PRN Norins, Heinz Knuckles, MD       nitroGLYCERIN (NITROSTAT) SL tablet 0.4 mg  0.4 mg Sublingual Q5 min PRN Norins, Heinz Knuckles, MD       pregabalin (LYRICA) capsule  150 mg  150 mg Oral BID Norins, Heinz Knuckles, MD       traZODone (DESYREL) tablet 25 mg  25 mg Oral QHS PRN Norins, Heinz Knuckles, MD        Allergies as of 05/26/2022 - Review Complete 05/26/2022  Allergen Reaction Noted   Codeine Itching 06/08/2016    Review of Systems:    Constitutional: No weight loss, fever, chills, weakness or fatigue HEENT: Eyes: No change in vision               Ears, Nose, Throat:  No change in hearing or congestion Skin: No rash or itching Cardiovascular: No chest pain, chest pressure or palpitations   Respiratory: No SOB or cough Gastrointestinal: See HPI and otherwise negative Genitourinary: No dysuria or change in urinary frequency Neurological: No headache, dizziness or syncope Musculoskeletal: No new muscle or joint pain Hematologic: No bleeding or bruising Psychiatric: No history of depression or anxiety     Physical Exam:  Vital signs in last 24 hours: Temp:  [97.3 F (36.3 C)-98.4 F (36.9 C)] 98.4 F (36.9 C) (09/10 0816) Pulse Rate:  [55-87] 64 (09/10 0814) Resp:  [10-30] 20 (09/10 0814) BP: (114-150)/(50-90) 137/77 (09/10 0814) SpO2:  [92 %-99 %] 97 % (09/10 0814) Weight:  [72.6 kg-88.5 kg] 72.6 kg (09/10 0359)   Last BM recorded by nurses in past 5 days No data recorded  General:   Pleasant, well developed female in no acute distress Head:  Normocephalic and atraumatic. Eyes: sclerae anicteric,conjunctive pink  Heart:  regular rate and rhythm Pulm: Clear anteriorly; no wheezing Abdomen:  Soft, Obese AB, Active bowel sounds. No tenderness . Without guarding and Without rebound, No organomegaly  appreciated. Extremities:  Without edema. Msk:  Symmetrical without gross deformities. Peripheral pulses intact.  Neurologic:  Alert and  oriented x4;  No focal deficits.  Skin:   Dry and intact without significant lesions or rashes. Psychiatric:  Cooperative. Normal mood and affect.  LAB RESULTS: Recent Labs    05/26/22 1950  WBC 6.9  HGB 14.0  HCT 41.7  PLT 256   BMET Recent Labs    05/26/22 1950  NA 142  K 3.8  CL 107  CO2 27  GLUCOSE 121*  BUN 13  CREATININE 0.82  CALCIUM 9.7   LFT Recent Labs    05/26/22 1950  PROT 7.4  ALBUMIN 4.1  AST 170*  ALT 75*  ALKPHOS 68  BILITOT 1.2   PT/INR No results for input(s): "LABPROT", "INR" in the last 72 hours.  STUDIES: US Abdomen Limited RUQ (LIVER/GB)  Result Date: 05/26/2022 CLINICAL DATA:  Right upper quadrant pain.  Nausea and vomiting. EXAM: ULTRASOUND ABDOMEN LIMITED RIGHT UPPER QUADRANT COMPARISON:  None Available. FINDINGS: Gallbladder: Several tiny gallstones are noted. No evidence of gallbladder wall thickening or pericholecystic fluid. Common bile duct: Diameter: 8 mm, which is mildly dilated. Liver: No focal lesion identified. Within normal limits in parenchymal echogenicity. Portal vein is patent on color Doppler imaging with normal direction of blood flow towards the liver. Other: None. IMPRESSION: Cholelithiasis.  No signs of cholecystitis. Mild biliary ductal dilatation, with common bile duct measuring 8 mm. Recommend correlation with liver function tests, and consider MRCP for further evaluation if clinically warranted. Electronically Signed   By: Marlaine Hind M.D.   On: 05/26/2022 20:44     Vladimir Crofts  05/27/2022, 10:07 AM

## 2022-05-27 NOTE — Subjective & Objective (Signed)
Ms. Panek, a 61 y/o withprior h/o cholilithiasis, DM, HTN, SAD had the onset last night of severe RUQ abdominal pain that was progressively worse ovenight. She presented to [facility] for evaluation which revealed elevated AST,ALT and cholithiasis with CBD at 54m. Labs were otherwise normal. [facility]

## 2022-05-28 DIAGNOSIS — K8050 Calculus of bile duct without cholangitis or cholecystitis without obstruction: Secondary | ICD-10-CM

## 2022-05-28 DIAGNOSIS — K8010 Calculus of gallbladder with chronic cholecystitis without obstruction: Secondary | ICD-10-CM | POA: Diagnosis not present

## 2022-05-28 DIAGNOSIS — I10 Essential (primary) hypertension: Secondary | ICD-10-CM | POA: Diagnosis not present

## 2022-05-28 DIAGNOSIS — Z9049 Acquired absence of other specified parts of digestive tract: Secondary | ICD-10-CM | POA: Diagnosis present

## 2022-05-28 LAB — GLUCOSE, CAPILLARY
Glucose-Capillary: 103 mg/dL — ABNORMAL HIGH (ref 70–99)
Glucose-Capillary: 103 mg/dL — ABNORMAL HIGH (ref 70–99)
Glucose-Capillary: 86 mg/dL (ref 70–99)

## 2022-05-28 LAB — COMPREHENSIVE METABOLIC PANEL
ALT: 54 U/L — ABNORMAL HIGH (ref 0–44)
AST: 36 U/L (ref 15–41)
Albumin: 3.4 g/dL — ABNORMAL LOW (ref 3.5–5.0)
Alkaline Phosphatase: 56 U/L (ref 38–126)
Anion gap: 8 (ref 5–15)
BUN: 12 mg/dL (ref 8–23)
CO2: 25 mmol/L (ref 22–32)
Calcium: 9.4 mg/dL (ref 8.9–10.3)
Chloride: 106 mmol/L (ref 98–111)
Creatinine, Ser: 0.81 mg/dL (ref 0.44–1.00)
GFR, Estimated: >60 mL/min (ref >60)
Glucose, Bld: 97 mg/dL (ref 70–99)
Potassium: 3.4 mmol/L — ABNORMAL LOW (ref 3.5–5.1)
Sodium: 139 mmol/L (ref 135–145)
Total Bilirubin: 1.6 mg/dL — ABNORMAL HIGH (ref 0.3–1.2)
Total Protein: 6.1 g/dL — ABNORMAL LOW (ref 6.5–8.1)

## 2022-05-28 LAB — HIV ANTIBODY (ROUTINE TESTING W REFLEX): HIV Screen 4th Generation wRfx: NONREACTIVE

## 2022-05-28 MED ORDER — POTASSIUM CHLORIDE CRYS ER 20 MEQ PO TBCR
40.0000 meq | EXTENDED_RELEASE_TABLET | Freq: Once | ORAL | Status: AC
Start: 2022-05-28 — End: 2022-05-28
  Administered 2022-05-28: 40 meq via ORAL
  Filled 2022-05-28: qty 2

## 2022-05-28 NOTE — Discharge Summary (Addendum)
Physician Discharge Summary   Patient: Lisa Casey MRN: [id number] DOB: 11/16/1960  Admit date:     05/26/2022  Discharge date: 05/28/22  Discharge Physician: Marylu Lund   PCP: [name], FNP   Recommendations at discharge:    Follow up with PCP in 1-2 weeks Follow up with General Surgery as scheduled Cont to follow up on chronic small lung nodule  Discharge Diagnoses: Principal Problem:   Abdominal pain Active Problems:   Essential hypertension   Prediabetes   Cholelithiasis   Choledocholithiasis  Resolved Problems:   * No resolved hospital problems. [facility] Course: 61 y/o withprior h/o cholilithiasis, DM, HTN, SAD had the onset last night of severe RUQ abdominal pain that was progressively worse ovenight. She presented to [facility] for evaluation which revealed elevated AST,ALT and cholithiasis with CBD at 32mm. Labs were otherwise normal. She was accepted in transfer to [facility]. Abd U/S with gallstone and CBD 8 mm. Patient accepted in transfer for GI and surgical consults and treatment  Assessment and Plan: * Abdominal pain secondary to cholilithiasis RUQ abdominal pain most likely from Cholithiasis. No evidence of infection.  General Surgery and GI were consulted per below Resolved Successfully advanced to regular diet without issues   Cholelithiasis Patient with prior finding of cholethiasis which but she did not have continued symptoms or pursue surgery. NOw with recurrent colic and elevated LFTs with mild dilation CBD  -GI and General Surgery consulted -Underwent f/u MRCP which notable for cholelithiasis without evidence of acute cholecystitsi and no biliary ductal dilitation or choledocholithiasis - Discussed with Surgery. OK to advance diet. Surgery plans for elective outpatient cholecystectomy after d/c -Diet was successfully advanced to regular. Discussed with GI who has cleared for d/c as well    Prediabetes Last A1C 12/26/21 6.2%. Patient on metformin    Essential hypertension BP controlled.   Small R lung nodule -Noted incidentally on abd CT here -Chart reviewed, had been noted previously on prior imaging as far back as 2014 -Recommend continued follow up with PCP   Consultants: GI, General Surgery Procedures performed: MRCP  Disposition: Home Diet recommendation:  Carb modified diet DISCHARGE MEDICATION: Allergies as of 05/28/2022       Reactions   Codeine Itching        Medication List     STOP taking these medications    Saxenda 18 MG/3ML Sopn Generic drug: Liraglutide -Weight Management       TAKE these medications    acetaminophen 500 MG tablet Commonly known as: TYLENOL Take 1,000 mg by mouth every 6 (six) hours as needed for moderate pain or headache.   albuterol 108 (90 Base) MCG/ACT inhaler Commonly known as: VENTOLIN HFA Inhale 2 puffs into the lungs every 6 (six) hours as needed for wheezing or shortness of breath.   amLODipine 5 MG tablet Commonly known as: NORVASC Take 1 tablet (5 mg total) by mouth daily.   aspirin EC 81 MG tablet Take 1 tablet (81 mg total) by mouth daily. Swallow whole.   atorvastatin 10 MG tablet Commonly known as: Lipitor Take 1 tab by mouth MWF   busPIRone 5 MG tablet Commonly known as: BUSPAR Take 1 tablet (5 mg total) by mouth 2 (two) times daily.   clopidogrel 75 MG tablet Commonly known as: PLAVIX Take 1 tablet by mouth once daily   hydrOXYzine 25 MG capsule Commonly known as: VISTARIL TAKE 1 CAPSULE BY MOUTH THREE TIMES DAILY AS NEEDED FOR ANXIETY   levocetirizine 5 MG  tablet Commonly known as: XYZAL TAKE 1 TABLET BY MOUTH ONCE DAILY IN THE EVENING   metFORMIN 750 MG 24 hr tablet Commonly known as: GLUCOPHAGE-XR Take 1 tablet (750 mg total) by mouth daily with breakfast.   metoprolol tartrate 25 MG tablet Commonly known as: LOPRESSOR Take 1/2 (one-half) tablet by mouth twice daily   multivitamin with minerals Tabs tablet Take 1 tablet by mouth  daily.   nitroGLYCERIN 0.4 MG SL tablet Commonly known as: NITROSTAT Place 1 tablet (0.4 mg total) under the tongue every 5 (five) minutes as needed for chest pain.   OneTouch Delica Lancets 10C Misc Use as directed to check blood sugars 2 times per day dx: e11.65   OneTouch Verio test strip Generic drug: glucose blood Use as directed to check blood sugars 2 times per day dx: e11.65   OneTouch Verio w/Device Kit Use as directed to check blood sugars 2 times per day dx: e11.65   pregabalin 150 MG capsule Commonly known as: LYRICA Take 1 capsule by mouth twice daily   Restasis 0.05 % ophthalmic emulsion Generic drug: cycloSPORINE Place 1 drop into both eyes daily as needed (dry eye).        Follow-up Information     Ralene Ok, MD Follow up on 06/15/2022.   Specialty: General Surgery Why: 940am. Please arrive 30 minutes prior to your appointment for paperwork. Please bring a copy of your photo ID and insurance card. Contact information: [address] [place] [address] [phone]         [name], FNP Follow up in 2 week(s).   Specialty: General Practice Why: Hospital follow up Contact information: [address] [place] [address] [phone]         Elouise Munroe, MD .   Specialties: Cardiology, Radiology Contact information: [address] [place] [address] [phone]                Discharge Exam: Danley Danker Weights   05/26/22 1947 05/27/22 0359 05/28/22 0639  Weight: 88.5 kg 72.6 kg 85.2 kg   General exam: Awake, laying in bed, in nad Respiratory system: Normal respiratory effort, no wheezing Cardiovascular system: regular rate, s1, s2 Gastrointestinal system: Soft, nondistended, positive BS Central nervous system: CN2-12 grossly intact, strength intact Extremities: Perfused, no clubbing Skin: Normal skin turgor, no notable skin lesions seen Psychiatry: Mood normal // no  visual hallucinations    Condition at discharge: fair  The results of significant diagnostics from this hospitalization (including imaging, microbiology, ancillary and laboratory) are listed below for reference.   Imaging Studies: MR ABDOMEN MRCP W WO CONTAST  Result Date: 05/27/2022 CLINICAL DATA:  Cholelithiasis. EXAM: MRI ABDOMEN WITHOUT AND WITH CONTRAST (INCLUDING MRCP) TECHNIQUE: Multiplanar multisequence MR imaging of the abdomen was performed both before and after the administration of intravenous contrast. Heavily T2-weighted images of the biliary and pancreatic ducts were obtained, and three-dimensional MRCP images were rendered by post processing. CONTRAST:  7.62mL GADAVIST GADOBUTROL 1 MMOL/ML IV SOLN COMPARISON:  Ultrasound May 26, 2022. FINDINGS: Despite efforts by the technologist and patient, motion artifact is present on today's exam and could not be eliminated. This reduces exam sensitivity and specificity. Lower chest: 7 mm pulmonary nodule in the left lung base. Hepatobiliary: No significant hepatic steatosis. No suspicious hepatic lesion. Cholelithiasis without evidence of acute cholecystitis. No biliary ductal dilation or choledocholithiasis. Pancreas: No mass, inflammatory changes, or other parenchymal abnormality identified. Spleen:  Within normal limits in  size and appearance. Adrenals/Urinary Tract: No masses identified. No evidence of hydronephrosis. Stomach/Bowel: Visualized portions within the abdomen are unremarkable. Vascular/Lymphatic: No pathologically enlarged lymph nodes identified. No abdominal aortic aneurysm demonstrated. Other:  No significant abdominal free fluid. Musculoskeletal: No suspicious bone lesions identified. IMPRESSION: 1. Cholelithiasis without evidence of acute cholecystitis. 2. No biliary ductal dilation or choledocholithiasis. 3. Solid 7 mm pulmonary nodule in the left lung base, consider further evaluation with dedicated chest CT. Electronically  Signed   By: Dahlia Bailiff M.D.   On: 05/27/2022 19:25   US Abdomen Limited RUQ (LIVER/GB)  Result Date: 05/26/2022 CLINICAL DATA:  Right upper quadrant pain.  Nausea and vomiting. EXAM: ULTRASOUND ABDOMEN LIMITED RIGHT UPPER QUADRANT COMPARISON:  None Available. FINDINGS: Gallbladder: Several tiny gallstones are noted. No evidence of gallbladder wall thickening or pericholecystic fluid. Common bile duct: Diameter: 8 mm, which is mildly dilated. Liver: No focal lesion identified. Within normal limits in parenchymal echogenicity. Portal vein is patent on color Doppler imaging with normal direction of blood flow towards the liver. Other: None. IMPRESSION: Cholelithiasis.  No signs of cholecystitis. Mild biliary ductal dilatation, with common bile duct measuring 8 mm. Recommend correlation with liver function tests, and consider MRCP for further evaluation if clinically warranted. Electronically Signed   By: Marlaine Hind M.D.   On: 05/26/2022 20:44    Microbiology: Results for orders placed or performed in visit on 06/29/21  Urine Culture     Status: None   Collection Time: 06/29/21 12:20 PM   Specimen: Urine   BN  Result Value Ref Range Status   Urine Culture, Routine Final report  Final   Organism ID, Bacteria Comment  Final    Comment: Culture shows less than 10,000 colony forming units of bacteria per milliliter of urine. This colony count is not generally considered to be clinically significant.     Labs: CBC: Recent Labs  Lab 05/26/22 1950  WBC 6.9  NEUTROABS 4.2  HGB 14.0  HCT 41.7  MCV 90.3  PLT 973   Basic Metabolic Panel: Recent Labs  Lab 05/26/22 1950 05/28/22 0410  NA 142 139  K 3.8 3.4*  CL 107 106  CO2 27 25  GLUCOSE 121* 97  BUN 13 12  CREATININE 0.82 0.81  CALCIUM 9.7 9.4   Liver Function Tests: Recent Labs  Lab 05/26/22 1950 05/28/22 0410  AST 170* 36  ALT 75* 54*  ALKPHOS 68 56  BILITOT 1.2 1.6*  PROT 7.4 6.1*  ALBUMIN 4.1 3.4*   CBG: Recent  Labs  Lab 05/27/22 1639 05/27/22 2216 05/28/22 0219 05/28/22 0636 05/28/22 1132  GLUCAP 99 91 103* 103* 86    Discharge time spent: less than 30 minutes.  Signed: Marylu Lund, MD [facility] 05/28/2022

## 2022-05-28 NOTE — Hospital Course (Signed)
61 y/o withprior h/o cholilithiasis, DM, HTN, SAD had the onset last night of severe RUQ abdominal pain that was progressively worse ovenight. She presented to [facility] for evaluation which revealed elevated AST,ALT and cholithiasis with CBD at 74m. Labs were otherwise normal. [facility]/S with gallstone and CBD 8 mm. Patient accepted in transfer for GI and surgical consults and treatment

## 2022-05-28 NOTE — TOC Progression Note (Signed)
Transition of Care [facility]) - Progression Note    Patient Details  Name: Lisa Casey MRN: [id number] Date of Birth: May 02, 1961  Transition of Care [facility]) CM/SW Contact  Zenon Mayo, RN Phone Number: 05/28/2022, 5:12 PM  Clinical Narrative:     Patient for dc , no needs.       Expected Discharge Plan and Services           Expected Discharge Date: 05/28/22                                     Social Determinants of Health (SDOH) Interventions    Readmission Risk Interventions     No data to display

## 2022-05-28 NOTE — Progress Notes (Signed)
   05/28/22 1448  Mobility  Activity Ambulated independently in hallway  Level of Assistance Independent  Assistive Device None  Distance Ambulated (ft) 550 ft  Activity Response Tolerated well  $Mobility charge 1 Mobility   Mobility Specialist Progress Note  Received pt in bed having no complaints and agreeable to mobility. Pt was asymptomatic throughout ambulation and returned to room w/o fault. Left in bed w/ call bell in reach and all needs met.   Lucious Groves Mobility Specialist

## 2022-05-28 NOTE — TOC Progression Note (Signed)
Transition of Care [facility]) - Progression Note    Patient Details  Name: Lisa Casey MRN: [id number] Date of Birth: 1961/06/13  [facility]) CM/SW Contact  Zenon Mayo, RN Phone Number: 05/28/2022, 4:38 PM  Clinical Narrative:     from home with spouse, abd pain, GI following, gallbladder flare up, MRCP done yest, u/sound shows dilated gall bladder.  TOC following.        Expected Discharge Plan and Services                                                 Social Determinants of Health (SDOH) Interventions    Readmission Risk Interventions     No data to display

## 2022-05-28 NOTE — Progress Notes (Signed)
Progress Note     Subjective: Pain is much better today. Still very mild in epigastrium but only with palpation which she describes as feeling sore. No RUQ TTP. Tolerated clear liquids well yesterday. She is hopeful to go home soon   Objective: Vital signs in last 24 hours: Temp:  [97.4 F (36.3 C)-98 F (36.7 C)] 97.4 F (36.3 C) (09/11 0639) Pulse Rate:  [64-70] 70 (09/11 0639) Resp:  [18-20] 18 (09/11 0639) BP: (111-153)/(71-78) 153/78 (09/11 0639) SpO2:  [93 %-97 %] 93 % (09/11 0639) Weight:  [85.2 kg] 85.2 kg (09/11 0639)    Intake/Output from previous day: 09/10 0701 - 09/11 0700 In: 1339.7 [P.O.:920; I.V.:419.7] Out: -  Intake/Output this shift: No intake/output data recorded.  PE: General: pleasant, WD, female who is laying in bed in NAD Lungs: Respiratory effort nonlabored Abd: soft, ND, +BS, no masses, hernias, or organomegaly. Very mild TTP in epigastrium without rebound or guarding. No RUQ TTP MSK: all 4 extremities are symmetrical with no cyanosis, clubbing, or edema. Skin: warm and dry with no masses, lesions, or rashes Psych: A&Ox3 with an appropriate affect.    Lab Results:  Recent Labs    05/26/22 1950  WBC 6.9  HGB 14.0  HCT 41.7  PLT 256   BMET Recent Labs    05/26/22 1950 05/28/22 0410  NA 142 139  K 3.8 3.4*  CL 107 106  CO2 27 25  GLUCOSE 121* 97  BUN 13 12  CREATININE 0.82 0.81  CALCIUM 9.7 9.4   PT/INR No results for input(s): "LABPROT", "INR" in the last 72 hours. CMP     Component Value Date/Time   NA 139 05/28/2022 0410   NA 146 (H) 12/26/2021 1200   K 3.4 (L) 05/28/2022 0410   CL 106 05/28/2022 0410   CO2 25 05/28/2022 0410   GLUCOSE 97 05/28/2022 0410   BUN 12 05/28/2022 0410   BUN 19 12/26/2021 1200   CREATININE 0.81 05/28/2022 0410   CALCIUM 9.4 05/28/2022 0410   PROT 6.1 (L) 05/28/2022 0410   PROT 6.8 12/26/2021 1200   ALBUMIN 3.4 (L) 05/28/2022 0410   ALBUMIN 4.5 12/26/2021 1200   AST 36 05/28/2022 0410    ALT 54 (H) 05/28/2022 0410   ALKPHOS 56 05/28/2022 0410   BILITOT 1.6 (H) 05/28/2022 0410   BILITOT 1.0 12/26/2021 1200   GFRNONAA >60 05/28/2022 0410   GFRAA 61 08/09/2020 1638   Lipase     Component Value Date/Time   LIPASE 35 05/27/2022 0944       Studies/Results: MR ABDOMEN MRCP W WO CONTAST  Result Date: 05/27/2022 CLINICAL DATA:  Cholelithiasis. EXAM: MRI ABDOMEN WITHOUT AND WITH CONTRAST (INCLUDING MRCP) TECHNIQUE: Multiplanar multisequence MR imaging of the abdomen was performed both before and after the administration of intravenous contrast. Heavily T2-weighted images of the biliary and pancreatic ducts were obtained, and three-dimensional MRCP images were rendered by post processing. CONTRAST:  7.58m GADAVIST GADOBUTROL 1 MMOL/ML IV SOLN COMPARISON:  Ultrasound May 26, 2022. FINDINGS: Despite efforts by the technologist and patient, motion artifact is present on today's exam and could not be eliminated. This reduces exam sensitivity and specificity. Lower chest: 7 mm pulmonary nodule in the left lung base. Hepatobiliary: No significant hepatic steatosis. No suspicious hepatic lesion. Cholelithiasis without evidence of acute cholecystitis. No biliary ductal dilation or choledocholithiasis. Pancreas: No mass, inflammatory changes, or other parenchymal abnormality identified. Spleen:  Within normal limits in size and appearance. Adrenals/Urinary Tract: No masses identified.  No evidence of hydronephrosis. Stomach/Bowel: Visualized portions within the abdomen are unremarkable. Vascular/Lymphatic: No pathologically enlarged lymph nodes identified. No abdominal aortic aneurysm demonstrated. Other:  No significant abdominal free fluid. Musculoskeletal: No suspicious bone lesions identified. IMPRESSION: 1. Cholelithiasis without evidence of acute cholecystitis. 2. No biliary ductal dilation or choledocholithiasis. 3. Solid 7 mm pulmonary nodule in the left lung base, consider further  evaluation with dedicated chest CT. Electronically Signed   By: Dahlia Bailiff M.D.   On: 05/27/2022 19:25   US Abdomen Limited RUQ (LIVER/GB)  Result Date: 05/26/2022 CLINICAL DATA:  Right upper quadrant pain.  Nausea and vomiting. EXAM: ULTRASOUND ABDOMEN LIMITED RIGHT UPPER QUADRANT COMPARISON:  None Available. FINDINGS: Gallbladder: Several tiny gallstones are noted. No evidence of gallbladder wall thickening or pericholecystic fluid. Common bile duct: Diameter: 8 mm, which is mildly dilated. Liver: No focal lesion identified. Within normal limits in parenchymal echogenicity. Portal vein is patent on color Doppler imaging with normal direction of blood flow towards the liver. Other: None. IMPRESSION: Cholelithiasis.  No signs of cholecystitis. Mild biliary ductal dilatation, with common bile duct measuring 8 mm. Recommend correlation with liver function tests, and consider MRCP for further evaluation if clinically warranted. Electronically Signed   By: Marlaine Hind M.D.   On: 05/26/2022 20:44    Anti-infectives: Anti-infectives (From admission, onward)    None        Assessment/Plan Cholelithiasis - US showing Cholelithiasis, no signs of cholecystitis, mild biliary ductal dilatation, with common bile duct measuring 8 mm. - ALT and T bili are minimally elevated but overall labs reassuring especially in setting of MRCP findings and physical exam - MRCP negative for choledocholithiasis or cholecystitis - possible she may have passed a stone. GI consulted as well - no indication for urgent surgical intervention. MRCP negative. Patient likely with biliary colic and possible passed stone. Pending GI evaluation - recommend advancing diet. If tolerating diet stable for discharge from surgical perspective and can consider elective cholecystectomy outpatient   FEN: NPO - okay for regular diet ID: None indicated from surgical perspective VTE: Heparin subq   Per  primary: Prediabetes Hypertension Fibromyalgia    I reviewed Consultant GI notes, hospitalist notes, last 24 h vitals and pain scores, last 48 h intake and output, last 24 h labs and trends, and last 24 h imaging results.    LOS: 1 day   [facility] 05/28/2022, 8:56 AM Please see Amion for pager number during day hours 7:00am-4:30pm

## 2022-05-28 NOTE — Progress Notes (Signed)
RN went over discharge instructions with the patient's and patient's daughter at the bedside. RN made patient aware of medication changes and follow up appointments. Patient verbalize understanding. No CM needs. PIVS removed and sites are clean dry and intact. Tele monitor removed and CCMD notified. Patient request work Quarry manager. MD paged and notified.

## 2022-05-29 ENCOUNTER — Encounter: Payer: Self-pay | Admitting: Nurse Practitioner

## 2022-05-29 ENCOUNTER — Other Ambulatory Visit: Payer: Self-pay | Admitting: Nurse Practitioner

## 2022-05-29 MED ORDER — PREGABALIN 150 MG PO CAPS: 150.0000 mg | ORAL_CAPSULE | Freq: Two times a day (BID) | ORAL | 0 refills | Status: DC

## 2022-05-29 MED ORDER — CYCLOSPORINE 0.05 % OP EMUL: 1.0000 [drp] | Freq: Every day | OPHTHALMIC | 1 refills | Status: DC | PRN

## 2022-05-29 MED ORDER — PREGABALIN 150 MG PO CAPS: 150.0000 mg | ORAL_CAPSULE | Freq: Two times a day (BID) | ORAL | 1 refills | Status: DC

## 2022-05-29 NOTE — Telephone Encounter (Signed)
Approve Pregabalin 150 MG

## 2022-06-01 ENCOUNTER — Telehealth: Payer: Self-pay

## 2022-06-01 NOTE — Telephone Encounter (Signed)
Transition Care Management Follow-up Telephone Call Date of discharge and from where: 05/28/2022 How have you been since you were released from the hospital? Pt states she is still experiencing stomach pain.  Any questions or concerns? No  Items Reviewed: Did the pt receive and understand the discharge instructions provided? Yes  Medications obtained and verified? Yes  Other? No  Any new allergies since your discharge? No  Dietary orders reviewed? Yes Do you have support at home? Yes   Home Care and Equipment/Supplies: Were home health services ordered? no If so, what is the name of the agency? N/a  Has the agency set up a time to come to the patient's home? no Were any new equipment or medical supplies ordered?  No What is the name of the medical supply agency? N/a Were you able to get the supplies/equipment? no Do you have any questions related to the use of the equipment or supplies? No  Functional Questionnaire: (I = Independent and D = Dependent) ADLs: i  Bathing/Dressing- i  Meal Prep- i  Eating- i  Maintaining continence- i  Transferring/Ambulation- i  Managing Meds- i  Follow up appointments reviewed:  PCP Hospital f/u appt confirmed? No  Scheduled to see n/a on n/a @ n/a. [facility] f/u appt confirmed? Yes  Scheduled to see GI on Monday  @ 945. Are transportation arrangements needed? No  If their condition worsens, is the pt aware to call PCP or go to the Emergency Dept.? Yes Was the patient provided with contact information for the PCP's office or ED? Yes Was to pt encouraged to call back with questions or concerns? Yes

## 2022-06-05 ENCOUNTER — Other Ambulatory Visit (HOSPITAL_COMMUNITY): Payer: Self-pay | Admitting: Gastroenterology

## 2022-06-05 ENCOUNTER — Other Ambulatory Visit: Payer: Self-pay | Admitting: Gastroenterology

## 2022-06-05 DIAGNOSIS — R1011 Right upper quadrant pain: Secondary | ICD-10-CM

## 2022-06-07 ENCOUNTER — Ambulatory Visit (HOSPITAL_COMMUNITY)
Admission: RE | Admit: 2022-06-07 | Discharge: 2022-06-07 | Disposition: A | Discharge status: Home / Self Care | Payer: BC Managed Care – PPO | Source: Ambulatory Visit | Attending: Gastroenterology | Admitting: Gastroenterology

## 2022-06-07 DIAGNOSIS — R1011 Right upper quadrant pain: Secondary | ICD-10-CM | POA: Diagnosis present

## 2022-06-07 MED ORDER — TECHNETIUM TC 99M MEBROFENIN IV KIT
5.2000 | PACK | Freq: Once | INTRAVENOUS | Status: AC | PRN
  Administered 2022-06-07: 5.2 via INTRAVENOUS

## 2022-06-15 ENCOUNTER — Other Ambulatory Visit: Payer: Self-pay

## 2022-06-15 DIAGNOSIS — I7090 Unspecified atherosclerosis: Secondary | ICD-10-CM

## 2022-06-15 MED ORDER — ATORVASTATIN CALCIUM 10 MG PO TABS: ORAL_TABLET | ORAL | 1 refills | Status: DC

## 2022-06-19 ENCOUNTER — Ambulatory Visit: Payer: BC Managed Care – PPO

## 2022-06-21 ENCOUNTER — Other Ambulatory Visit: Payer: Self-pay

## 2022-06-21 DIAGNOSIS — I7090 Unspecified atherosclerosis: Secondary | ICD-10-CM

## 2022-06-21 MED ORDER — ATORVASTATIN CALCIUM 10 MG PO TABS: ORAL_TABLET | ORAL | 1 refills | Status: DC

## 2022-06-27 ENCOUNTER — Other Ambulatory Visit: Payer: Self-pay

## 2022-06-27 DIAGNOSIS — I7090 Unspecified atherosclerosis: Secondary | ICD-10-CM

## 2022-06-27 MED ORDER — ATORVASTATIN CALCIUM 10 MG PO TABS: ORAL_TABLET | ORAL | 1 refills | Status: DC

## 2022-07-03 LAB — HM DIABETES EYE EXAM

## 2022-07-05 ENCOUNTER — Ambulatory Visit (INDEPENDENT_AMBULATORY_CARE_PROVIDER_SITE_OTHER): Payer: BC Managed Care – PPO | Admitting: Nurse Practitioner

## 2022-07-05 ENCOUNTER — Encounter: Payer: Self-pay | Admitting: Nurse Practitioner

## 2022-07-05 VITALS — BP 130/80 | HR 71 | Temp 98.2°F | Ht 65.0 in | Wt 185.0 lb

## 2022-07-05 DIAGNOSIS — E6609 Other obesity due to excess calories: Secondary | ICD-10-CM

## 2022-07-05 DIAGNOSIS — Z0000 Encounter for general adult medical examination without abnormal findings: Secondary | ICD-10-CM

## 2022-07-05 DIAGNOSIS — I7090 Unspecified atherosclerosis: Secondary | ICD-10-CM | POA: Diagnosis not present

## 2022-07-05 DIAGNOSIS — R7303 Prediabetes: Secondary | ICD-10-CM | POA: Diagnosis not present

## 2022-07-05 DIAGNOSIS — I10 Essential (primary) hypertension: Secondary | ICD-10-CM

## 2022-07-05 DIAGNOSIS — R748 Abnormal levels of other serum enzymes: Secondary | ICD-10-CM

## 2022-07-05 DIAGNOSIS — Z6830 Body mass index (BMI) 30.0-30.9, adult: Secondary | ICD-10-CM

## 2022-07-05 DIAGNOSIS — Z23 Encounter for immunization: Secondary | ICD-10-CM | POA: Diagnosis not present

## 2022-07-05 DIAGNOSIS — R911 Solitary pulmonary nodule: Secondary | ICD-10-CM

## 2022-07-05 DIAGNOSIS — F419 Anxiety disorder, unspecified: Secondary | ICD-10-CM

## 2022-07-05 DIAGNOSIS — Z90710 Acquired absence of both cervix and uterus: Secondary | ICD-10-CM | POA: Insufficient documentation

## 2022-07-05 HISTORY — DX: Acquired absence of both cervix and uterus: Z90.710

## 2022-07-05 MED ORDER — BUSPIRONE HCL 10 MG PO TABS: 10.0000 mg | ORAL_TABLET | Freq: Two times a day (BID) | ORAL | 2 refills | Status: DC

## 2022-07-05 NOTE — Patient Instructions (Addendum)
Health Maintenance, Female Adopting a healthy lifestyle and getting preventive care are important in promoting health and wellness. Ask your health care provider about: The right schedule for you to have regular tests and exams. Things you can do on your own to prevent diseases and keep yourself healthy. What should I know about diet, weight, and exercise? Eat a healthy diet  Eat a diet that includes plenty of vegetables, fruits, low-fat dairy products, and lean protein. Do not eat a lot of foods that are high in solid fats, added sugars, or sodium. Maintain a healthy weight Body mass index (BMI) is used to identify weight problems. It estimates body fat based on height and weight. Your health care provider can help determine your BMI and help you achieve or maintain a healthy weight. Get regular exercise Get regular exercise. This is one of the most important things you can do for your health. Most adults should: Exercise for at least 150 minutes each week. The exercise should increase your heart rate and make you sweat (moderate-intensity exercise). Do strengthening exercises at least twice a week. This is in addition to the moderate-intensity exercise. Spend less time sitting. Even light physical activity can be beneficial. Watch cholesterol and blood lipids Have your blood tested for lipids and cholesterol at 62 years of age, then have this test every 5 years. Have your cholesterol levels checked more often if: Your lipid or cholesterol levels are high. You are older than 62 years of age. You are at high risk for heart disease. What should I know about cancer screening? Depending on your health history and family history, you may need to have cancer screening at various ages. This may include screening for: Breast cancer. Cervical cancer. Colorectal cancer. Skin cancer. Lung cancer. What should I know about heart disease, diabetes, and high blood pressure? Blood pressure and heart  disease High blood pressure causes heart disease and increases the risk of stroke. This is more likely to develop in people who have high blood pressure readings or are overweight. Have your blood pressure checked: Every 3-5 years if you are 18-39 years of age. Every year if you are 40 years old or older. Diabetes Have regular diabetes screenings. This checks your fasting blood sugar level. Have the screening done: Once every three years after age 40 if you are at a normal weight and have a low risk for diabetes. More often and at a younger age if you are overweight or have a high risk for diabetes. What should I know about preventing infection? Hepatitis B If you have a higher risk for hepatitis B, you should be screened for this virus. Talk with your health care provider to find out if you are at risk for hepatitis B infection. Hepatitis C Testing is recommended for: Everyone born from 1945 through 1965. Anyone with known risk factors for hepatitis C. Sexually transmitted infections (STIs) Get screened for STIs, including gonorrhea and chlamydia, if: You are sexually active and are younger than 62 years of age. You are older than 62 years of age and your health care provider tells you that you are at risk for this type of infection. Your sexual activity has changed since you were last screened, and you are at increased risk for chlamydia or gonorrhea. Ask your health care provider if you are at risk. Ask your health care provider about whether you are at high risk for HIV. Your health care provider may recommend a prescription medicine to help prevent HIV   infection. If you choose to take medicine to prevent HIV, you should first get tested for HIV. You should then be tested every 3 months for as long as you are taking the medicine. Pregnancy If you are about to stop having your period (premenopausal) and you may become pregnant, seek counseling before you get pregnant. Take 400 to 800  micrograms (mcg) of folic acid every day if you become pregnant. Ask for birth control (contraception) if you want to prevent pregnancy. Osteoporosis and menopause Osteoporosis is a disease in which the bones lose minerals and strength with aging. This can result in bone fractures. If you are 44 years old or older, or if you are at risk for osteoporosis and fractures, ask your health care provider if you should: Be screened for bone loss. Take a calcium or vitamin D supplement to lower your risk of fractures. Be given hormone replacement therapy (HRT) to treat symptoms of menopause. Follow these instructions at home: Alcohol use Do not drink alcohol if: Your health care provider tells you not to drink. You are pregnant, may be pregnant, or are planning to become pregnant. If you drink alcohol: Limit how much you have to: 0-1 drink a day. Know how much alcohol is in your drink. In the [place] one drink equals one 12 oz bottle of beer (355 mL), one 5 oz glass of wine (148 mL), or one 1 oz glass of hard liquor (44 mL). Lifestyle Do not use any products that contain nicotine or tobacco. These products include cigarettes, chewing tobacco, and vaping devices, such as e-cigarettes. If you need help quitting, ask your health care provider. Do not use street drugs. Do not share needles. Ask your health care provider for help if you need support or information about quitting drugs. General instructions Schedule regular health, dental, and eye exams. Stay current with your vaccines. Tell your health care provider if: You often feel depressed. You have ever been abused or do not feel safe at home. Summary Adopting a healthy lifestyle and getting preventive care are important in promoting health and wellness. Follow your health care provider's instructions about healthy diet, exercising, and getting tested or screened for diseases. Follow your health care provider's instructions on monitoring your  cholesterol and blood pressure. This information is not intended to replace advice given to you by your health care provider. Make sure you discuss any questions you have with your health care provider. Document Revised: 01/23/2021 Document Reviewed: 01/23/2021 Elsevier Patient Education  [place].  Influenza (Flu) Vaccine (Inactivated or Recombinant): What You Need to Know 1. Why get vaccinated? Influenza vaccine can prevent influenza (flu). Flu is a contagious disease that spreads around the [place] every year, usually between October and May. Anyone can get the flu, but it is more dangerous for some people. Infants and young children, people 16 years and older, pregnant people, and people with certain health conditions or a weakened immune system are at greatest risk of flu complications. Pneumonia, bronchitis, sinus infections, and ear infections are examples of flu-related complications. If you have a medical condition, such as heart disease, cancer, or diabetes, flu can make it worse. Flu can cause fever and chills, sore throat, muscle aches, fatigue, cough, headache, and runny or stuffy nose. Some people may have vomiting and diarrhea, though this is more common in children than adults. In an average year, thousands of people in the [place] die from flu, and many more are hospitalized. Flu vaccine prevents millions  of illnesses and flu-related visits to the doctor each year. 2. Influenza vaccines CDC recommends everyone 6 months and older get vaccinated every flu season. Children 6 months through 78 years of age may need 2 doses during a single flu season. Everyone else needs only 1 dose each flu season. It takes about 2 weeks for protection to develop after vaccination. There are many flu viruses, and they are always changing. Each year a new flu vaccine is made to protect against the influenza viruses believed to be likely to cause disease in the upcoming flu season. Even  when the vaccine doesn't exactly match these viruses, it may still provide some protection. Influenza vaccine does not cause flu. Influenza vaccine may be given at the same time as other vaccines. 3. Talk with your health care provider Tell your vaccination provider if the person getting the vaccine: Has had an allergic reaction after a previous dose of influenza vaccine, or has any severe, life-threatening allergies Has ever had Guillain-Barr Syndrome (also called "GBS") In some cases, your health care provider may decide to postpone influenza vaccination until a future visit. Influenza vaccine can be administered at any time during pregnancy. People who are or will be pregnant during influenza season should receive inactivated influenza vaccine. People with minor illnesses, such as a cold, may be vaccinated. People who are moderately or severely ill should usually wait until they recover before getting influenza vaccine. Your health care provider can give you more information. 4. Risks of a vaccine reaction Soreness, redness, and swelling where the shot is given, fever, muscle aches, and headache can happen after influenza vaccination. There may be a very small increased risk of Guillain-Barr Syndrome (GBS) after inactivated influenza vaccine (the flu shot). Young children who get the flu shot along with pneumococcal vaccine (PCV13) and/or DTaP vaccine at the same time might be slightly more likely to have a seizure caused by fever. Tell your health care provider if a child who is getting flu vaccine has ever had a seizure. People sometimes faint after medical procedures, including vaccination. Tell your provider if you feel dizzy or have vision changes or ringing in the ears. As with any medicine, there is a very remote chance of a vaccine causing a severe allergic reaction, other serious injury, or death. 5. What if there is a serious problem? An allergic reaction could occur after the  vaccinated person leaves the clinic. If you see signs of a severe allergic reaction (hives, swelling of the face and throat, difficulty breathing, a fast heartbeat, dizziness, or weakness), call 9-1-1 and get the person to the nearest hospital. For other signs that concern you, call your health care provider. Adverse reactions should be reported to the Vaccine Adverse Event Reporting System (VAERS). Your health care provider will usually file this report, or you can do it yourself. Visit the VAERS website at www.vaers.SamedayNews.es or call 2204353124. VAERS is only for reporting reactions, and VAERS staff members do not give medical advice. 6. The National Vaccine Injury Compensation Program The [employer] Vaccine Injury Compensation Program (VICP) is a federal program that was created to compensate people who may have been injured by certain vaccines. Claims regarding alleged injury or death due to vaccination have a time limit for filing, which may be as short as two years. Visit the VICP website at GoldCloset.com.ee or call 315-025-6800 to learn about the program and about filing a claim. 7. How can I learn more? Ask your health care provider. Call your local  or state health department. Visit the website of the Food and Drug Administration (FDA) for vaccine package inserts and additional information at TraderRating.uy. Contact the Centers for Disease Control and Prevention (CDC): Call (470)506-0776 (1-800-CDC-INFO) or Visit CDC's website at https://gibson.com/. Source: CDC Vaccine Information Statement Inactivated Influenza Vaccine (04/22/2020) This same material is available at http://www.wolf.info/ for no charge. This information is not intended to replace advice given to you by your health care provider. Make sure you discuss any questions you have with your health care provider. Document Revised: 08/02/2021 Document Reviewed: 05/25/2021 Elsevier Patient Education   [place].

## 2022-07-05 NOTE — Progress Notes (Signed)
I,Lisa Casey,acting as a Education administrator for [employer], FNP.,have documented all relevant documentation on the behalf of Lisa Brine, FNP,as directed by  Lisa Brine, FNP while in the presence of Lisa Casey, [place].  Subjective:     Patient ID: Lisa Casey , female    DOB: [birth year] , 61 y.o.   MRN: [id number]   Chief Complaint  Patient presents with   Annual Exam    HPI  Patient presents today for HM.   She has been to GI provider [name]. She is watching what she eats, nuts, fruits and vegetables. She is monitoring what causes pain and she will f/u with him next Tuesday to determine next steps. She may need her gallbladder removed. She is to call [facility] after update with Dr Benson Norway.   She did not start Saxenda.        Past Medical History:  Diagnosis Date   Anxiety    HX OF ANXIETY ATTACKS   Back pain, chronic    Bronchitis    Cholelithiasis 04/15/2021   Diabetes ([place])    started Metformin in 02/2021   Fibromyalgia    GERD (gastroesophageal reflux disease)    H/O total hysterectomy 07/05/2022   Cervix removed per patient   History of kidney stones    Hypertension    Migraines    HX OF MIGRAINES   Myocardial infarction [facility])    july 2022   Obesity    Pneumonia    06-2021   Shortened PR interval      Family History  Problem Relation Age of Onset   Breast cancer Mother 75   Breast cancer Paternal Grandmother    Heart failure Neg Hx    Coronary artery disease Neg Hx      Current Outpatient Medications:    acetaminophen (TYLENOL) 500 MG tablet, Take 1,000 mg by mouth every 6 (six) hours as needed for moderate pain or headache., Disp: , Rfl:    albuterol (VENTOLIN HFA) 108 (90 Base) MCG/ACT inhaler, Inhale 2 puffs into the lungs every 6 (six) hours as needed for wheezing or shortness of breath., Disp: , Rfl:    amLODipine (NORVASC) 5 MG tablet, Take 1 tablet (5 mg total) by mouth daily., Disp: 90 tablet, Rfl: 3   aspirin EC 81 MG EC  tablet, Take 1 tablet (81 mg total) by mouth daily. Swallow whole., Disp: 30 tablet, Rfl: 11   atorvastatin (LIPITOR) 10 MG tablet, Take 1 tab by mouth MWF, Disp: 45 tablet, Rfl: 1   Blood Glucose Monitoring Suppl (ONETOUCH VERIO) w/Device KIT, Use as directed to check blood sugars 2 times per day dx: e11.65, Disp: 1 kit, Rfl: 1   cycloSPORINE (RESTASIS) 0.05 % ophthalmic emulsion, Place 1 drop into both eyes daily as needed (dry eye)., Disp: 0.4 mL, Rfl: 1   glucose blood (ONETOUCH VERIO) test strip, Use as directed to check blood sugars 2 times per day dx: e11.65, Disp: 100 each, Rfl: 5   levocetirizine (XYZAL) 5 MG tablet, TAKE 1 TABLET BY MOUTH ONCE DAILY IN THE EVENING (Patient taking differently: Take 5 mg by mouth every evening.), Disp: 90 tablet, Rfl: 0   metFORMIN (GLUCOPHAGE-XR) 750 MG 24 hr tablet, Take 1 tablet (750 mg total) by mouth daily with breakfast., Disp: 30 tablet, Rfl: 2   Multiple Vitamin (MULTIVITAMIN WITH MINERALS) TABS tablet, Take 1 tablet by mouth daily., Disp: , Rfl:    nitroGLYCERIN (NITROSTAT) 0.4 MG SL tablet, Place 1 tablet (0.4 mg  total) under the tongue every 5 (five) minutes as needed for chest pain., Disp: 25 tablet, Rfl: 4   OneTouch Delica Lancets 16X MISC, Use as directed to check blood sugars 2 times per day dx: e11.65, Disp: 100 each, Rfl: 5   pregabalin (LYRICA) 150 MG capsule, Take 1 capsule (150 mg total) by mouth 2 (two) times daily., Disp: 180 capsule, Rfl: 1   busPIRone (BUSPAR) 10 MG tablet, Take 1 tablet (10 mg total) by mouth 2 (two) times daily., Disp: 60 tablet, Rfl: 2   Allergies  Allergen Reactions   Codeine Itching      The patient states she uses status post hysterectomy for birth control.  Patient's last menstrual period was 08/04/2011.. Negative for Dysmenorrhea and Negative for Menorrhagia. Negative for: breast discharge, breast lump(s), breast pain and breast self exam. Associated symptoms include abnormal vaginal bleeding. Pertinent  negatives include abnormal bleeding (hematology), anxiety, decreased libido, depression, difficulty falling sleep, dyspareunia, history of infertility, nocturia, sexual dysfunction, sleep disturbances, urinary incontinence, urinary urgency, vaginal discharge and vaginal itching. Diet regular; she is to eat nuts, avocado and beans more since her visit with [name]. She is cutting back on fried foods and starches. The patient states her exercise level is moderate with walking 3 times a week for 20 minutes. She is having more back pain her procedure in July was ineffective.   The patient's tobacco use is:  Social History   Tobacco Use  Smoking Status Former   Packs/day: 0.25   Years: 10.00   Total pack years: 2.50   Types: Cigarettes   Quit date: 1997   Years since quitting: 26.8  Smokeless Tobacco Never  . She has been exposed to passive smoke. The patient's alcohol use is:  Social History   Substance and Sexual Activity  Alcohol Use No     Review of Systems  Constitutional: Negative.   HENT: Negative.    Eyes: Negative.   Respiratory: Negative.    Cardiovascular: Negative.   Gastrointestinal: Negative.   Endocrine: Negative.   Genitourinary: Negative.   Musculoskeletal: Negative.   Skin: Negative.   Allergic/Immunologic: Negative.   Neurological: Negative.   Hematological: Negative.   Psychiatric/Behavioral: Negative.       Today's Vitals   07/05/22 1133  BP: 130/80  Pulse: 71  Temp: 98.2 F (36.8 C)  TempSrc: Oral  Weight: 185 lb (83.9 kg)  Height: 5' 5" (1.651 m)   Body mass index is 30.79 kg/m.  Wt Readings from Last 3 Encounters:  07/05/22 185 lb (83.9 kg)  05/28/22 187 lb 12.8 oz (85.2 kg)  05/08/22 191 lb 3.2 oz (86.7 kg)    Objective:  Physical Exam Vitals reviewed.  Constitutional:      General: She is not in acute distress.    Appearance: Normal appearance. She is well-developed. She is obese.  HENT:     Head: Normocephalic and atraumatic.      Right Ear: Hearing, tympanic membrane, ear canal and external ear normal. There is no impacted cerumen.     Left Ear: Hearing, tympanic membrane, ear canal and external ear normal. There is no impacted cerumen.     Nose:     Comments: Deferred - masked    Mouth/Throat:     Comments: Deferred - masked Eyes:     General: Lids are normal.     Extraocular Movements: Extraocular movements intact.     Conjunctiva/sclera: Conjunctivae normal.     Pupils: Pupils are equal, round, and  reactive to light.     Funduscopic exam:    Right eye: No papilledema.        Left eye: No papilledema.  Neck:     Thyroid: No thyroid mass.     Vascular: No carotid bruit.  Cardiovascular:     Rate and Rhythm: Normal rate and regular rhythm.     Pulses: Normal pulses.     Heart sounds: Normal heart sounds. No murmur heard. Pulmonary:     Effort: Pulmonary effort is normal.     Breath sounds: Normal breath sounds.  Chest:     Chest wall: No mass.  Breasts:    Tanner Score is 5.     Right: Normal. No mass or tenderness.     Left: Normal. No mass or tenderness.  Abdominal:     General: Abdomen is flat. Bowel sounds are normal. There is no distension.     Palpations: Abdomen is soft.     Tenderness: There is no abdominal tenderness.  Genitourinary:    Comments: N/A Musculoskeletal:        General: No swelling. Normal range of motion.     Cervical back: Full passive range of motion without pain, normal range of motion and neck supple.     Right lower leg: No edema.     Left lower leg: No edema.  Lymphadenopathy:     Upper Body:     Right upper body: No supraclavicular, axillary or pectoral adenopathy.     Left upper body: No supraclavicular, axillary or pectoral adenopathy.  Skin:    General: Skin is warm and dry.     Capillary Refill: Capillary refill takes less than 2 seconds.  Neurological:     General: No focal deficit present.     Mental Status: She is alert and oriented to person, place, and  time.     Cranial Nerves: No cranial nerve deficit.     Sensory: No sensory deficit.  Psychiatric:        Mood and Affect: Mood normal.        Behavior: Behavior normal.        Thought Content: Thought content normal.        Judgment: Judgment normal.         Assessment And Plan:     1. Encounter for annual physical exam Behavior modifications discussed and diet history reviewed.   Pt will continue to exercise regularly and modify diet with low GI, plant based foods and decrease intake of processed foods.  Recommend intake of daily multivitamin, Vitamin D, and calcium.  Recommend mammogram and colonoscopy for preventive screenings, as well as recommend immunizations that include influenza, TDAP, and Shingles  2. Class 1 obesity due to excess calories with serious comorbidity and body mass index (BMI) of 30.0 to 30.9 in adult Chronic Discussed healthy diet and regular exercise options  Encouraged to exercise at least 150 minutes per week with 2 days of strength training  3. Essential hypertension Comments: Blood pressure is fairly controlled, continue with lifestyle changes. Continue f/u with Cardiology - POCT Urinalysis Dipstick (81002) - Microalbumin / Creatinine Urine Ratio  4. Prediabetes Stable, diet controlled  5. Atherosclerosis Comments: Continue statin, tolerating well.   6. Need for influenza vaccination Influenza vaccine administered Encouraged to take Tylenol as needed for fever or muscle aches. - Flu Vaccine QUAD 6+ mos PF IM (Fluarix Quad PF)  7. Solitary pulmonary nodule Comments: Will check for CT scan due to incidental finding of  solitary pulmonary nodule. Seen on MRI of abdomen size is 17m, recommend CT scan - CT Chest Wo Contrast; Future  8. Anxiety Comments: Increased her buspirone to 10 mg twice a day due to having more anxiety mostly in evening - busPIRone (BUSPAR) 10 MG tablet; Take 1 tablet (10 mg total) by mouth 2 (two) times daily.  Dispense: 60  tablet; Refill: 2  9. Elevated liver enzymes Comments: Levels increased when she went tot he hospital, I will have her to hold her atorvastatin for 2 weeks then come back for liver profile - Liver Profile  10. H/O total hysterectomy    Patient was given opportunity to ask questions. Patient verbalized understanding of the plan and was able to repeat key elements of the plan. All questions were answered to their satisfaction.   JMinette Brine FNP   I, JMinette Brine FNP, have reviewed all documentation for this visit. The documentation on 07/05/22 for the exam, diagnosis, procedures, and orders are all accurate and complete.   THE PATIENT IS ENCOURAGED TO PRACTICE SOCIAL DISTANCING DUE TO THE COVID-19 PANDEMIC.

## 2022-07-22 ENCOUNTER — Other Ambulatory Visit: Payer: Self-pay | Admitting: Nurse Practitioner

## 2022-07-22 DIAGNOSIS — R0982 Postnasal drip: Secondary | ICD-10-CM

## 2022-07-23 ENCOUNTER — Encounter: Payer: Self-pay | Admitting: Nurse Practitioner

## 2022-07-31 ENCOUNTER — Encounter: Payer: Self-pay | Admitting: Nurse Practitioner

## 2022-08-02 ENCOUNTER — Other Ambulatory Visit: Payer: Self-pay | Admitting: Nurse Practitioner

## 2022-08-02 DIAGNOSIS — R911 Solitary pulmonary nodule: Secondary | ICD-10-CM

## 2022-08-07 ENCOUNTER — Other Ambulatory Visit: Payer: BC Managed Care – PPO

## 2022-09-03 ENCOUNTER — Encounter: Payer: Self-pay | Admitting: Nurse Practitioner

## 2022-10-09 ENCOUNTER — Other Ambulatory Visit: Payer: Self-pay | Admitting: Nurse Practitioner

## 2022-10-09 DIAGNOSIS — R7303 Prediabetes: Secondary | ICD-10-CM

## 2022-10-12 ENCOUNTER — Encounter: Payer: Self-pay | Admitting: Nurse Practitioner

## 2022-11-05 DIAGNOSIS — G4730 Sleep apnea, unspecified: Secondary | ICD-10-CM | POA: Insufficient documentation

## 2022-11-05 DIAGNOSIS — K625 Hemorrhage of anus and rectum: Secondary | ICD-10-CM | POA: Insufficient documentation

## 2022-11-05 DIAGNOSIS — Z800 Family history of malignant neoplasm of digestive organs: Secondary | ICD-10-CM | POA: Insufficient documentation

## 2022-11-05 DIAGNOSIS — K5900 Constipation, unspecified: Secondary | ICD-10-CM | POA: Insufficient documentation

## 2022-11-06 ENCOUNTER — Ambulatory Visit: Payer: BC Managed Care – PPO | Admitting: Nurse Practitioner

## 2022-11-06 ENCOUNTER — Encounter: Payer: Self-pay | Admitting: Nurse Practitioner

## 2022-11-06 VITALS — BP 128/82 | HR 82 | Temp 98.8°F | Ht 65.0 in | Wt 192.0 lb

## 2022-11-06 DIAGNOSIS — R7303 Prediabetes: Secondary | ICD-10-CM | POA: Diagnosis not present

## 2022-11-06 DIAGNOSIS — I10 Essential (primary) hypertension: Secondary | ICD-10-CM | POA: Diagnosis not present

## 2022-11-06 DIAGNOSIS — I7090 Unspecified atherosclerosis: Secondary | ICD-10-CM

## 2022-11-06 DIAGNOSIS — R0981 Nasal congestion: Secondary | ICD-10-CM | POA: Diagnosis not present

## 2022-11-06 DIAGNOSIS — M545 Low back pain, unspecified: Secondary | ICD-10-CM

## 2022-11-06 DIAGNOSIS — E6609 Other obesity due to excess calories: Secondary | ICD-10-CM

## 2022-11-06 DIAGNOSIS — Z6831 Body mass index (BMI) 31.0-31.9, adult: Secondary | ICD-10-CM

## 2022-11-06 DIAGNOSIS — F439 Reaction to severe stress, unspecified: Secondary | ICD-10-CM

## 2022-11-06 DIAGNOSIS — G8929 Other chronic pain: Secondary | ICD-10-CM

## 2022-11-06 MED ORDER — AMOXICILLIN-POT CLAVULANATE 875-125 MG PO TABS: 1.0000 | ORAL_TABLET | Freq: Two times a day (BID) | ORAL | 0 refills | Status: DC

## 2022-11-06 MED ORDER — PREDNISONE 10 MG (21) PO TBPK: ORAL_TABLET | ORAL | 0 refills | Status: DC

## 2022-11-06 NOTE — Patient Instructions (Addendum)
You can take over the counter Ashwaganda to help you to sleep, continue with the magnesium supplement at least 250 mg. Tumeric tea and ginger are good as well   Will refer you to a therapist.  Call pharmacies to see if they have [place] available and let me know. [employer] will call about the pain device.

## 2022-11-06 NOTE — Progress Notes (Signed)
I,Sheena H Holbrook,acting as a Education administrator for [name], FNP.,have documented all relevant documentation on the behalf of [name], FNP,as directed by  [name], FNP while in the presence of [name], [place].    Subjective:     Patient ID: Lisa Casey , female    DOB: Feb 15, 1961 , 62 y.o.   MRN: [id number]   Chief Complaint  Patient presents with   Back Pain   Nasal Congestion    HPI  Patient presents today for follow up on chronic LBP.  She also fell this morning, tripped while she was walking while at work at [employer], thinks may have aggravated her back; denies any other injuries. She has not been back to Dr Thedore Mins for her back. She has always had a problem with sciatica and fell. She has done injections.    She also has nasal congestion, x3 w, reports yellow mucus when blowing nose; denies fever/chills, shob/wheeze.  Denies fever. Cough is mostly at night. She has been taking Mucinex. She has been using an antihistamine. She will switch back and forth when it seems like it is not working. She has not done any nasal rinses. She has tried before but became strangled.    Wt Readings from Last 3 Encounters: 11/06/22 : 192 lb (87.1 kg) 07/05/22 : 185 lb (83.9 kg) 05/28/22 : 187 lb 12.8 oz (85.2 kg)        Past Medical History:  Diagnosis Date   Anxiety    HX OF ANXIETY ATTACKS   Back pain, chronic    Bronchitis    Cholelithiasis 04/15/2021   Diabetes ([place])    started Metformin in 02/2021   Fibromyalgia    GERD (gastroesophageal reflux disease)    H/O total hysterectomy 07/05/2022   Cervix removed per patient   History of kidney stones    Hypertension    Migraines    HX OF MIGRAINES   Myocardial infarction [facility])    july 2022   Obesity    Pneumonia    06-2021   Shortened PR interval      Family History  Problem Relation Age of Onset   Breast cancer Mother 68   Breast cancer Paternal Grandmother    Heart failure Neg Hx    Coronary  artery disease Neg Hx      Current Outpatient Medications:    acetaminophen (TYLENOL) 500 MG tablet, Take 1,000 mg by mouth every 6 (six) hours as needed for moderate pain or headache., Disp: , Rfl:    albuterol (VENTOLIN HFA) 108 (90 Base) MCG/ACT inhaler, Inhale 2 puffs into the lungs every 6 (six) hours as needed for wheezing or shortness of breath., Disp: , Rfl:    amLODipine (NORVASC) 5 MG tablet, Take 1 tablet (5 mg total) by mouth daily., Disp: 90 tablet, Rfl: 3   amoxicillin-clavulanate (AUGMENTIN) 875-125 MG tablet, Take 1 tablet by mouth 2 (two) times daily., Disp: 14 tablet, Rfl: 0   aspirin EC 81 MG EC tablet, Take 1 tablet (81 mg total) by mouth daily. Swallow whole., Disp: 30 tablet, Rfl: 11   atorvastatin (LIPITOR) 10 MG tablet, Take 1 tab by mouth MWF, Disp: 45 tablet, Rfl: 1   Blood Glucose Monitoring Suppl (ONETOUCH VERIO) w/Device KIT, Use as directed to check blood sugars 2 times per day dx: e11.65, Disp: 1 kit, Rfl: 1   busPIRone (BUSPAR) 10 MG tablet, Take 1 tablet (10 mg total) by mouth 2 (two) times daily., Disp: 60 tablet,  Rfl: 2   cycloSPORINE (RESTASIS) 0.05 % ophthalmic emulsion, Place 1 drop into both eyes daily as needed (dry eye)., Disp: 0.4 mL, Rfl: 1   glucose blood (ONETOUCH VERIO) test strip, Use as directed to check blood sugars 2 times per day dx: e11.65, Disp: 100 each, Rfl: 5   levocetirizine (XYZAL) 5 MG tablet, Take 1 tablet (5 mg total) by mouth every evening., Disp: 90 tablet, Rfl: 1   metFORMIN (GLUCOPHAGE-XR) 750 MG 24 hr tablet, Take 1 tablet by mouth once daily with breakfast, Disp: 30 tablet, Rfl: 0   Multiple Vitamin (MULTIVITAMIN WITH MINERALS) TABS tablet, Take 1 tablet by mouth daily., Disp: , Rfl:    nitroGLYCERIN (NITROSTAT) 0.4 MG SL tablet, Place 1 tablet (0.4 mg total) under the tongue every 5 (five) minutes as needed for chest pain., Disp: 25 tablet, Rfl: 4   omeprazole (PRILOSEC) 40 MG capsule, Take 40 mg by mouth daily., Disp: , Rfl:     OneTouch Delica Lancets 99991111 MISC, Use as directed to check blood sugars 2 times per day dx: e11.65, Disp: 100 each, Rfl: 5   predniSONE (STERAPRED UNI-PAK 21 TAB) 10 MG (21) TBPK tablet, Take as directed, Disp: 21 tablet, Rfl: 0   pregabalin (LYRICA) 150 MG capsule, Take 1 capsule (150 mg total) by mouth 2 (two) times daily., Disp: 180 capsule, Rfl: 1   Allergies  Allergen Reactions   Codeine Itching     Review of Systems  Constitutional: Negative.   HENT:  Positive for congestion.   Respiratory: Negative.    Cardiovascular: Negative.   Musculoskeletal:  Positive for back pain.  Neurological: Negative.   Psychiatric/Behavioral: Negative.    All other systems reviewed and are negative.    Today's Vitals   11/06/22 1038  BP: 128/82  Pulse: 82  Temp: 98.8 F (37.1 C)  TempSrc: Oral  SpO2: 97%  Weight: 192 lb (87.1 kg)  Height: '5\' 5"'$  (1.651 m)   Body mass index is 31.95 kg/m.   Objective:  Physical Exam Vitals reviewed.  Constitutional:      General: She is not in acute distress.    Appearance: Normal appearance. She is obese.  Cardiovascular:     Rate and Rhythm: Normal rate and regular rhythm.     Pulses: Normal pulses.     Heart sounds: Normal heart sounds. No murmur heard. Pulmonary:     Effort: Pulmonary effort is normal. No respiratory distress.     Breath sounds: Normal breath sounds. No wheezing.  Skin:    General: Skin is warm and dry.     Capillary Refill: Capillary refill takes less than 2 seconds.  Neurological:     General: No focal deficit present.     Mental Status: She is alert and oriented to person, place, and time.     Cranial Nerves: No cranial nerve deficit.     Motor: No weakness.  Psychiatric:        Mood and Affect: Mood normal.        Behavior: Behavior normal.        Thought Content: Thought content normal.        Cognition and Memory: Cognition normal.        Judgment: Judgment normal.         Assessment And Plan:     1.  Essential hypertension Comments: Blood pressure is fairly controlled.  Continue current medications. - CMP14+EGFR  2. Atherosclerosis Comments: Continue statin, tolerating well. - CMP14+EGFR - Lipid panel  3. Prediabetes Comments: Hemoglobin A1c is stable.  Encouraged to limit intake of sugary foods and drinks.  Increase physical to vitamin D to at least 150/week as tolerated. - Hemoglobin A1c  4. Nasal congestion Comments: Will treat with steroid taper this has been persistent and not relieved with steroid nasal spray or antihistamine - predniSONE (STERAPRED UNI-PAK 21 TAB) 10 MG (21) TBPK tablet; Take as directed  Dispense: 21 tablet; Refill: 0 - amoxicillin-clavulanate (AUGMENTIN) 875-125 MG tablet; Take 1 tablet by mouth 2 (two) times daily.  Dispense: 14 tablet; Refill: 0  5. Feeling stressed out Comments: She is ready to see a counselor due to her multiple responsibilities. Will refer to a counselor. - Ambulatory referral to Psychology  6. Chronic right-sided low back pain without sciatica Comments: Steroid taper and will order a Zynex pain device.  7. Class 1 obesity due to excess calories without serious comorbidity with body mass index (BMI) of 31.0 to 31.9 in adult She is ready to see a therapist, she continues to care for her special needs son, her mother and she is also married    Patient was given opportunity to ask questions. Patient verbalized understanding of the plan and was able to repeat key elements of the plan. All questions were answered to their satisfaction.  [name], FNP   I, [name], FNP, have reviewed all documentation for this visit. The documentation on 11/06/22 for the exam, diagnosis, procedures, and orders are all accurate and complete.   IF YOU HAVE BEEN REFERRED TO A SPECIALIST, IT MAY TAKE 1-2 WEEKS TO SCHEDULE/PROCESS THE REFERRAL. IF YOU HAVE NOT HEARD FROM US/SPECIALIST IN TWO WEEKS, PLEASE GIVE [place] A CALL AT [phone].   THE  PATIENT IS ENCOURAGED TO PRACTICE SOCIAL DISTANCING DUE TO THE COVID-19 PANDEMIC.

## 2022-11-07 LAB — LIPID PANEL
Chol/HDL Ratio: 2.1 ratio (ref 0.0–4.4)
Cholesterol, Total: 158 mg/dL (ref 100–199)
HDL: 74 mg/dL (ref >39)
LDL Chol Calc (NIH): 72 mg/dL (ref 0–99)
Triglycerides: 61 mg/dL (ref 0–149)
VLDL Cholesterol Cal: 12 mg/dL (ref 5–40)

## 2022-11-07 LAB — CMP14+EGFR
ALT: 11 IU/L (ref 0–32)
AST: 16 IU/L (ref 0–40)
Albumin/Globulin Ratio: 1.7 (ref 1.2–2.2)
Albumin: 4.5 g/dL (ref 3.9–4.9)
Alkaline Phosphatase: 83 IU/L (ref 44–121)
BUN/Creatinine Ratio: 27 (ref 12–28)
BUN: 18 mg/dL (ref 8–27)
Bilirubin Total: 0.5 mg/dL (ref 0.0–1.2)
CO2: 25 mmol/L (ref 20–29)
Calcium: 9.5 mg/dL (ref 8.7–10.3)
Chloride: 106 mmol/L (ref 96–106)
Creatinine, Ser: 0.67 mg/dL (ref 0.57–1.00)
Globulin, Total: 2.7 g/dL (ref 1.5–4.5)
Glucose: 99 mg/dL (ref 70–99)
Potassium: 4.1 mmol/L (ref 3.5–5.2)
Sodium: 145 mmol/L — ABNORMAL HIGH (ref 134–144)
Total Protein: 7.2 g/dL (ref 6.0–8.5)
eGFR: 99 mL/min/{1.73_m2} (ref >59)

## 2022-11-07 LAB — HEMOGLOBIN A1C
Est. average glucose Bld gHb Est-mCnc: 134 mg/dL
Hgb A1c MFr Bld: 6.3 % — ABNORMAL HIGH (ref 4.8–5.6)

## 2022-11-22 ENCOUNTER — Encounter: Payer: Self-pay | Admitting: Nurse Practitioner

## 2022-11-22 NOTE — Telephone Encounter (Signed)
See other MY message.

## 2022-11-27 ENCOUNTER — Other Ambulatory Visit: Payer: Self-pay | Admitting: Nurse Practitioner

## 2023-01-29 ENCOUNTER — Other Ambulatory Visit: Payer: Self-pay | Admitting: Nurse Practitioner

## 2023-01-29 DIAGNOSIS — R0982 Postnasal drip: Secondary | ICD-10-CM

## 2023-02-05 ENCOUNTER — Other Ambulatory Visit: Payer: Self-pay | Admitting: Nurse Practitioner

## 2023-02-05 DIAGNOSIS — F419 Anxiety disorder, unspecified: Secondary | ICD-10-CM

## 2023-02-05 DIAGNOSIS — R7303 Prediabetes: Secondary | ICD-10-CM

## 2023-02-25 ENCOUNTER — Other Ambulatory Visit: Payer: Self-pay

## 2023-02-25 ENCOUNTER — Ambulatory Visit
Admission: EM | Admit: 2023-02-25 | Discharge: 2023-02-25 | Disposition: A | Discharge status: Home / Self Care | Payer: BC Managed Care – PPO | Attending: Nurse Practitioner | Admitting: Nurse Practitioner

## 2023-02-25 DIAGNOSIS — J329 Chronic sinusitis, unspecified: Secondary | ICD-10-CM | POA: Diagnosis not present

## 2023-02-25 DIAGNOSIS — J40 Bronchitis, not specified as acute or chronic: Secondary | ICD-10-CM

## 2023-02-25 MED ORDER — PROMETHAZINE-DM 6.25-15 MG/5ML PO SYRP: 5.0000 mL | ORAL_SOLUTION | Freq: Four times a day (QID) | ORAL | 0 refills | Status: DC | PRN

## 2023-02-25 MED ORDER — AMOXICILLIN-POT CLAVULANATE 875-125 MG PO TABS: 1.0000 | ORAL_TABLET | Freq: Two times a day (BID) | ORAL | 0 refills | Status: DC

## 2023-02-25 MED ORDER — PREDNISONE 20 MG PO TABS: 40.0000 mg | ORAL_TABLET | Freq: Every day | ORAL | 0 refills | Status: AC

## 2023-02-25 NOTE — ED Triage Notes (Signed)
Pt presents with c/o chills, nasal congestion and intermittent cough.   Pt states she has taken Nyquil, has been able to sleep. Denies fever.

## 2023-02-25 NOTE — Discharge Instructions (Signed)
Start Augmentin twice daily for 7 days Prednisone daily for 5 days Promethazine DM as needed for cough.  Please of this medication make you drowsy.  Do not drink alcohol or drive on this medication Rest and fluids Please follow-up with your PCP if your symptoms do not improve Please go to the emergency room if you develop any worsening symptoms I hope you feel better soon!

## 2023-02-25 NOTE — ED Provider Notes (Addendum)
[facility]    CSN: [id number] Arrival date & time: 02/25/23  1033      History   Chief Complaint Chief Complaint  Patient presents with   Cough   Nasal Congestion    HPI Lisa Casey is a 62 y.o. female  presents for evaluation of URI symptoms for 7-8 days. Patient reports associated symptoms of sinus pressure/pain, cough, chills. Denies N/V/D, fevers, ear pain, sore throat, body aches, shortness of breath. Patient does not have a hx of asthma or smoking.  Husband has similar symptoms.  Pt has taken NyQuil OTC for symptoms.  She also took some of her husband Promethazine DM cough syrup as well as 4 doses of his Augmentin he had leftover from a recent illness.  Pt has no other concerns at this time.    Cough Associated symptoms: chills and headaches     Past Medical History:  Diagnosis Date   Anxiety    HX OF ANXIETY ATTACKS   Back pain, chronic    Bronchitis    Cholelithiasis 04/15/2021   Diabetes (HCC)    started Metformin in 02/2021   Fibromyalgia    GERD (gastroesophageal reflux disease)    H/O total hysterectomy 07/05/2022   Cervix removed per patient   History of kidney stones    Hypertension    Migraines    HX OF MIGRAINES   Myocardial infarction [facility])    july 2022   Obesity    Pneumonia    06-2021   Shortened PR interval     Patient Active Problem List   Diagnosis Date Noted   Chronic right-sided low back pain without sciatica 11/06/2022   Feeling stressed out 11/06/2022   Constipation 11/05/2022   Family history of malignant neoplasm of digestive organs 11/05/2022   Rectal bleeding 11/05/2022   Sleep apnea 11/05/2022   H/O total hysterectomy 07/05/2022   Choledocholithiasis 05/28/2022   Abdominal pain 05/26/2022   Class 1 obesity due to excess calories without serious comorbidity with body mass index (BMI) of 31.0 to 31.9 in adult 05/13/2022   Dry eyes, bilateral 05/13/2022   Atherosclerosis 05/13/2022   Diabetes mellitus (HCC)  07/19/2021   Hyperlipidemia 07/19/2021   Adjustment disorder with anxiety 05/25/2021   Elevated troponin    NSTEMI (non-ST elevated myocardial infarction) (HCC) 04/15/2021   Fibromyalgia 04/15/2021   Headache 04/15/2021   Cholelithiasis 04/15/2021   Anxiety 04/15/2021   Prediabetes 04/07/2021   Essential hypertension 08/09/2020   Abnormal glucose 08/09/2020   Obesity (BMI 30-39.9) 08/09/2020   Trigger finger, right middle finger 06/22/2020   Bilateral foot pain 03/31/2019   Palpitations 02/23/2019   Plantar fasciitis, bilateral 02/23/2019   Chronic pain syndrome 10/27/2018   Major depression, recurrent, chronic (HCC) 09/23/2018   Leiomyoma of uterus, unspecified 09/14/2011    Past Surgical History:  Procedure Laterality Date   CONSTIPATION     CYSTOSCOPY W/ URETERAL STENT PLACEMENT  09/23/2012   Procedure: CYSTOSCOPY WITH RETROGRADE PYELOGRAM/URETERAL STENT PLACEMENT;  Surgeon: Sebastian Ache, MD;  Location: [facility];  Service: Urology;  Laterality: Right;   CYSTOSCOPY WITH RETROGRADE PYELOGRAM, URETEROSCOPY AND STENT PLACEMENT  10/08/2012   Procedure: CYSTOSCOPY WITH RETROGRADE PYELOGRAM, URETEROSCOPY AND STENT PLACEMENT;  Surgeon: Sebastian Ache, MD;  Location: [facility];  Service: Urology;  Laterality: Right;  90 MIN NO RETROGRADE NEEDS FLEX URETEROSCOPE    CYSTOSCOPY/URETEROSCOPY/HOLMIUM LASER/STENT PLACEMENT Right 08/21/2021   Procedure: CYSTOSCOPY RIGHT /URETEROSCOPY/HOLMIUM LASER/STENT PLACEMENT;  Surgeon: Crista Elliot, MD;  Location:  [facility];  Service: Urology;  Laterality: Right;   HOLMIUM LASER APPLICATION  10/08/2012   Procedure: HOLMIUM LASER APPLICATION;  Surgeon: Sebastian Ache, MD;  Location: [facility];  Service: Urology;  Laterality: Right;   LEFT HEART CATH AND CORONARY ANGIOGRAPHY N/A 04/17/2021   Procedure: LEFT HEART CATH AND CORONARY ANGIOGRAPHY;  Surgeon: Kathleene Hazel, MD;  Location: [facility];  Service:  Cardiovascular;  Laterality: N/A;   LEFT HEART CATHETERIZATION WITH CORONARY ANGIOGRAM N/A 10/29/2011   Procedure: LEFT HEART CATHETERIZATION WITH CORONARY ANGIOGRAM;  Surgeon: Marykay Lex, MD;  Location: [facility] C[facility];  Service: Cardiovascular;  Laterality: N/A;   ROBOTIC ASSISTED LAP VAGINAL HYSTERECTOMY  09/14/2011   Procedure: ROBOTIC ASSISTED LAPAROSCOPIC VAGINAL HYSTERECTOMY;  Surgeon: Roseanna Rainbow, MD;  Location: [facility];  Service: Gynecology;  Laterality: N/A;   TUBAL LIGATION      OB History     Gravida  2   Para  2   Term      Preterm      AB      Living         SAB      IAB      Ectopic      Multiple      Live Births               Home Medications    Prior to Admission medications   Medication Sig Start Date End Date Taking? Authorizing Provider  amoxicillin-clavulanate (AUGMENTIN) 875-125 MG tablet Take 1 tablet by mouth every 12 (twelve) hours. 02/25/23  Yes Radford Pax, NP  predniSONE (DELTASONE) 20 MG tablet Take 2 tablets (40 mg total) by mouth daily with breakfast for 5 days. 02/25/23 03/02/23 Yes Radford Pax, NP  promethazine-dextromethorphan (PROMETHAZINE-DM) 6.25-15 MG/5ML syrup Take 5 mLs by mouth 4 (four) times daily as needed for cough. 02/25/23  Yes Radford Pax, NP  acetaminophen (TYLENOL) 500 MG tablet Take 1,000 mg by mouth every 6 (six) hours as needed for moderate pain or headache.    [provider]  albuterol (VENTOLIN HFA) 108 (90 Base) MCG/ACT inhaler Inhale 2 puffs into the lungs every 6 (six) hours as needed for wheezing or shortness of breath.    [provider]  amLODipine (NORVASC) 5 MG tablet Take 1 tablet (5 mg total) by mouth daily. 12/19/21   [name], FNP  aspirin EC 81 MG EC tablet Take 1 tablet (81 mg total) by mouth daily. Swallow whole. 04/18/21   Kathlen Mody, MD  atorvastatin (LIPITOR) 10 MG tablet Take 1 tab by mouth MWF 06/27/22   [name], FNP  Blood Glucose Monitoring Suppl  (ONETOUCH VERIO) w/Device KIT Use as directed to check blood sugars 2 times per day dx: e11.65 04/20/21   Charlesetta Ivory, NP  busPIRone (BUSPAR) 10 MG tablet Take 1 tablet by mouth twice daily 02/06/23   [name], FNP  cycloSPORINE (RESTASIS) 0.05 % ophthalmic emulsion Place 1 drop into both eyes daily as needed (dry eye). 05/29/22   [name], FNP  glucose blood (ONETOUCH VERIO) test strip Use as directed to check blood sugars 2 times per day dx: e11.65 04/20/21   Charlesetta Ivory, NP  levocetirizine (XYZAL) 5 MG tablet TAKE 1 TABLET BY MOUTH ONCE DAILY IN THE EVENING 01/29/23   [name], FNP  metFORMIN (GLUCOPHAGE-XR) 750 MG 24 hr tablet Take 1 tablet by mouth once daily with breakfast 02/06/23   [name], FNP  Multiple Vitamin (MULTIVITAMIN WITH MINERALS) TABS tablet Take 1 tablet by mouth daily.    [provider]  nitroGLYCERIN (NITROSTAT) 0.4 MG SL tablet Place 1 tablet (0.4 mg total) under the tongue every 5 (five) minutes as needed for chest pain. 03/23/22   Meriam Sprague, MD  omeprazole (PRILOSEC) 40 MG capsule Take 40 mg by mouth daily. 09/02/22   [provider]  OneTouch Delica Lancets 33G MISC Use as directed to check blood sugars 2 times per day dx: e11.65 04/20/21   Charlesetta Ivory, NP  pregabalin (LYRICA) 150 MG capsule Take 1 capsule by mouth twice daily 02/06/23   [name], FNP    Family History Family History  Problem Relation Age of Onset   Breast cancer Mother 73   Breast cancer Paternal Grandmother    Heart failure Neg Hx    Coronary artery disease Neg Hx     Social History Social History   Tobacco Use   Smoking status: Former    Packs/day: 0.25    Years: 10.00    Additional pack years: 0.00    Total pack years: 2.50    Types: Cigarettes    Quit date: 1997    Years since quitting: 27.4   Smokeless tobacco: Never  Vaping Use   Vaping Use: Never used  Substance Use Topics   Alcohol use: No   Drug use: No      Allergies   Codeine   Review of Systems Review of Systems  Constitutional:  Positive for chills.  HENT:  Positive for congestion, sinus pressure and sinus pain.   Respiratory:  Positive for cough.   Neurological:  Positive for headaches.     Physical Exam Triage Vital Signs ED Triage Vitals [02/25/23 1047]  Enc Vitals Group     BP 127/84     Pulse Rate 71     Resp 17     Temp 98.8 F (37.1 C)     Temp Source Oral     SpO2 96 %     Weight      Height      Head Circumference      Peak Flow      Pain Score 0     Pain Loc      Pain Edu?      Excl. in GC?    No data found.  Updated Vital Signs BP 127/84 (BP Location: Right Arm)   Pulse 71   Temp 98.8 F (37.1 C) (Oral)   Resp 17   LMP 08/04/2011   SpO2 96%   Visual Acuity Right Eye Distance:   Left Eye Distance:   Bilateral Distance:    Right Eye Near:   Left Eye Near:    Bilateral Near:     Physical Exam Vitals and nursing note reviewed.  Constitutional:      General: She is not in acute distress.    Appearance: She is well-developed. She is not ill-appearing.  HENT:     Head: Normocephalic and atraumatic.     Right Ear: Tympanic membrane and ear canal normal.     Left Ear: Tympanic membrane and ear canal normal.     Nose: Congestion present.     Mouth/Throat:     Mouth: Mucous membranes are moist.     Pharynx: Oropharynx is clear. Uvula midline. No oropharyngeal exudate or posterior oropharyngeal erythema.     Tonsils: No tonsillar exudate or tonsillar abscesses.  Eyes:     Conjunctiva/sclera:  Conjunctivae normal.     Pupils: Pupils are equal, round, and reactive to light.  Cardiovascular:     Rate and Rhythm: Normal rate and regular rhythm.     Heart sounds: Normal heart sounds.  Pulmonary:     Effort: Pulmonary effort is normal.     Breath sounds: Normal breath sounds.  Musculoskeletal:     Cervical back: Normal range of motion and neck supple.  Lymphadenopathy:     Cervical: No  cervical adenopathy.  Skin:    General: Skin is warm and dry.  Neurological:     General: No focal deficit present.     Mental Status: She is alert and oriented to person, place, and time.  Psychiatric:        Mood and Affect: Mood normal.        Behavior: Behavior normal.      UC Treatments / Results  Labs (all labs ordered are listed, but only abnormal results are displayed) Labs Reviewed - No data to display  CMP14+EGFR Order: [id number] Status: Final result     Visible to patient: Yes (seen)     Next appt: 07/15/2023 at 11:00 AM in Internal Medicine [name], FNP)     Dx: Essential hypertension; Atherosclerosis   0 Result Notes     1 Patient Communication     1 HM Topic          Component Ref Range & Units 3 mo ago (11/06/22) 9 mo ago (05/28/22) 9 mo ago (05/26/22) 1 yr ago (12/26/21) 1 yr ago (08/17/21) 1 yr ago (06/29/21) 1 yr ago (05/25/21)  Glucose 70 - 99 mg/dL 99 97 CM 409 High  CM 95 110 High  CM 100 High  CM 100 High  CM  BUN 8 - 27 mg/dL 18 12 R 13 R 19 15 R 18 18 R  Creatinine, Ser 0.57 - 1.00 mg/dL 8.11 9.14 R 7.82 R 9.56 0.70 R 0.80 0.72 R  eGFR >59 mL/min/1.73 99   77  84   BUN/Creatinine Ratio 12 - 28 27   22  23    Sodium 134 - 144 mmol/L 145 High  139 R 142 R 146 High  141 R 141 141 R  Potassium 3.5 - 5.2 mmol/L 4.1 3.4 Low  R 3.8 R 4.2 3.1 Low  R 4.0 3.8 R  Chloride 96 - 106 mmol/L 106 106 R 107 R 108 High  109 R 104 104 R  CO2 20 - 29 mmol/L 25 25 R 27 R 26 26 R 22 27 R  Calcium 8.7 - 10.3 mg/dL 9.5 9.4 R 9.7 R 9.9 8.9 R 9.6 9.9 R  Total Protein 6.0 - 8.5 g/dL 7.2 6.1 Low  R 7.4 R 6.8  7.1   Albumin 3.9 - 4.9 g/dL 4.5 3.4 Low  R 4.1 R 4.5 R  4.7 R   Globulin, Total 1.5 - 4.5 g/dL 2.7   2.3  2.4   Albumin/Globulin Ratio 1.2 - 2.2 1.7   2.0  2.0   Bilirubin Total 0.0 - 1.2 mg/dL 0.5 1.6 High  R 1.2 R 1.0  0.7   Alkaline Phosphatase 44 - 121 IU/L 83 56 R 68 R 76  69   AST 0 - 40 IU/L 16 36 R 170 High  R 16  13   ALT 0 - 32  IU/L 11 54 High  R 75 High  R 10  17   Resulting Agency [facility]  [facility]         Narrative Performed by: Verdell Carmine Performed at:  [address] [facility] [address] [place], [place]  [id number] Lab Director: Jolene Schimke MD, Phone:  [phone]    Specimen Collected: 11/06/22 11:30 Last Resulted: 11/07/22 06:36        EKG   Radiology No results found.  Procedures Procedures (including critical care time)  Medications Ordered in UC Medications - No data to display  Initial Impression / Assessment and Plan / UC Course  I have reviewed the triage vital signs and the nursing notes.  Pertinent labs & imaging results that were available during my care of the patient were reviewed by me and considered in my medical decision making (see chart for details).     Reviewed exam and symptoms with patient.  No red flags.  Start Augmentin twice daily for 7 days Promethazine DM as needed for cough, and prednisone daily for 5 days Rest and fluids PCP follow-up if symptoms do not improve ER precautions reviewed and patient verbalized understanding Final Clinical Impressions(s) / UC Diagnoses   Final diagnoses:  Sinobronchitis     Discharge Instructions      Start Augmentin twice daily for 7 days Prednisone daily for 5 days Promethazine DM as needed for cough.  Please of this medication make you drowsy.  Do not drink alcohol or drive on this medication Rest and fluids Please follow-up with your PCP if your symptoms do not improve Please go to the emergency room if you develop any worsening symptoms I hope you feel better soon!   ED Prescriptions     Medication Sig Dispense Auth. Provider   amoxicillin-clavulanate (AUGMENTIN) 875-125 MG tablet Take 1 tablet by mouth every 12 (twelve) hours. 14 tablet Radford Pax, NP   promethazine-dextromethorphan (PROMETHAZINE-DM) 6.25-15 MG/5ML syrup Take 5 mLs by mouth 4 (four)  times daily as needed for cough. 118 mL Radford Pax, NP   predniSONE (DELTASONE) 20 MG tablet Take 2 tablets (40 mg total) by mouth daily with breakfast for 5 days. 10 tablet Radford Pax, NP      PDMP not reviewed this encounter.   Radford Pax, NP 02/25/23 1103    Radford Pax, NP 02/25/23 1104

## 2023-03-03 ENCOUNTER — Ambulatory Visit
Admission: EM | Admit: 2023-03-03 | Discharge: 2023-03-03 | Disposition: A | Discharge status: Home / Self Care | Payer: BC Managed Care – PPO | Attending: Nurse Practitioner | Admitting: Nurse Practitioner

## 2023-03-03 DIAGNOSIS — B9689 Other specified bacterial agents as the cause of diseases classified elsewhere: Secondary | ICD-10-CM | POA: Diagnosis not present

## 2023-03-03 DIAGNOSIS — J029 Acute pharyngitis, unspecified: Secondary | ICD-10-CM

## 2023-03-03 DIAGNOSIS — U071 COVID-19: Secondary | ICD-10-CM | POA: Insufficient documentation

## 2023-03-03 DIAGNOSIS — J208 Acute bronchitis due to other specified organisms: Secondary | ICD-10-CM

## 2023-03-03 LAB — POCT INFLUENZA A/B
Influenza A, POC: NEGATIVE
Influenza B, POC: NEGATIVE

## 2023-03-03 LAB — POCT RAPID STREP A (OFFICE): Rapid Strep A Screen: NEGATIVE

## 2023-03-03 MED ORDER — ACETAMINOPHEN 325 MG PO TABS
650.0000 mg | ORAL_TABLET | Freq: Once | ORAL | Status: AC
  Administered 2023-03-03: 650 mg via ORAL

## 2023-03-03 MED ORDER — AZITHROMYCIN 250 MG PO TABS
250.0000 mg | ORAL_TABLET | Freq: Every day | ORAL | 0 refills | Status: DC
Start: 2023-03-03 — End: 2023-07-16

## 2023-03-03 MED ORDER — PROMETHAZINE-DM 6.25-15 MG/5ML PO SYRP
5.0000 mL | ORAL_SOLUTION | Freq: Four times a day (QID) | ORAL | 0 refills | Status: DC | PRN
Start: 2023-03-03 — End: 2023-07-30

## 2023-03-03 NOTE — ED Triage Notes (Signed)
Pt presents to UC w/ c/o body aches, sore throat, cough x6 days. Pt states she was seen here 6 days ago and symptoms have gotten worse.

## 2023-03-03 NOTE — ED Provider Notes (Addendum)
[facility]    CSN: [id number] Arrival date & time: 03/03/23  0803      History   Chief Complaint No chief complaint on file.   HPI Lisa Casey is a 62 y.o. female  presents for evaluation of URI symptoms for 2 weeks. Patient reports associated symptoms of, sore throat, body aches, headache. Denies N/V/D, fevers, ear pain, shortness of breath. Patient does not have a hx of asthma or smoking.  States she was exposed to strep via a Radio broadcast assistant.  Patient was seen in urgent care on 6/10 for 1 week of sinus pressure pain cough and chills.  She was prescribed Augmentin, promethazine, and prednisone.  She is still taking the Augmentin.  She is completed the prednisone and states that promethazine initially was helping with her cough but no longer is.  Pt has taken nothing OTC for symptoms. Pt has no other concerns at this time.   HPI  Past Medical History:  Diagnosis Date   Anxiety    HX OF ANXIETY ATTACKS   Back pain, chronic    Bronchitis    Cholelithiasis 04/15/2021   Diabetes (HCC)    started Metformin in 02/2021   Fibromyalgia    GERD (gastroesophageal reflux disease)    H/O total hysterectomy 07/05/2022   Cervix removed per patient   History of kidney stones    Hypertension    Migraines    HX OF MIGRAINES   Myocardial infarction [facility])    july 2022   Obesity    Pneumonia    06-2021   Shortened PR interval     Patient Active Problem List   Diagnosis Date Noted   Chronic right-sided low back pain without sciatica 11/06/2022   Feeling stressed out 11/06/2022   Constipation 11/05/2022   Family history of malignant neoplasm of digestive organs 11/05/2022   Rectal bleeding 11/05/2022   Sleep apnea 11/05/2022   H/O total hysterectomy 07/05/2022   Choledocholithiasis 05/28/2022   Abdominal pain 05/26/2022   Class 1 obesity due to excess calories without serious comorbidity with body mass index (BMI) of 31.0 to 31.9 in adult 05/13/2022   Dry eyes, bilateral  05/13/2022   Atherosclerosis 05/13/2022   Diabetes mellitus (HCC) 07/19/2021   Hyperlipidemia 07/19/2021   Adjustment disorder with anxiety 05/25/2021   Elevated troponin    NSTEMI (non-ST elevated myocardial infarction) (HCC) 04/15/2021   Fibromyalgia 04/15/2021   Headache 04/15/2021   Cholelithiasis 04/15/2021   Anxiety 04/15/2021   Prediabetes 04/07/2021   Essential hypertension 08/09/2020   Abnormal glucose 08/09/2020   Obesity (BMI 30-39.9) 08/09/2020   Trigger finger, right middle finger 06/22/2020   Bilateral foot pain 03/31/2019   Palpitations 02/23/2019   Plantar fasciitis, bilateral 02/23/2019   Chronic pain syndrome 10/27/2018   Major depression, recurrent, chronic (HCC) 09/23/2018   Leiomyoma of uterus, unspecified 09/14/2011    Past Surgical History:  Procedure Laterality Date   CONSTIPATION     CYSTOSCOPY W/ URETERAL STENT PLACEMENT  09/23/2012   Procedure: CYSTOSCOPY WITH RETROGRADE PYELOGRAM/URETERAL STENT PLACEMENT;  Surgeon: Sebastian Ache, MD;  Location: [facility];  Service: Urology;  Laterality: Right;   CYSTOSCOPY WITH RETROGRADE PYELOGRAM, URETEROSCOPY AND STENT PLACEMENT  10/08/2012   Procedure: CYSTOSCOPY WITH RETROGRADE PYELOGRAM, URETEROSCOPY AND STENT PLACEMENT;  Surgeon: Sebastian Ache, MD;  Location[facility];  Service: Urology;  Laterality: Right;  90 MIN NO RETROGRADE NEEDS FLEX URETEROSCOPE    CYSTOSCOPY/URETEROSCOPY/HOLMIUM LASER/STENT PLACEMENT Right 08/21/2021   Procedure: CYSTOSCOPY RIGHT /URETEROSCOPY/HOLMIUM LASER/STENT  PLACEMENT;  Surgeon: Crista Elliot, MD;  Location: [facility];  Service: Urology;  Laterality: Right;   HOLMIUM LASER APPLICATION  10/08/2012   Procedure: HOLMIUM LASER APPLICATION;  Surgeon: Sebastian Ache, MD;  Location: [facility];  Service: Urology;  Laterality: Right;   LEFT HEART CATH AND CORONARY ANGIOGRAPHY N/A 04/17/2021   Procedure: LEFT HEART CATH AND CORONARY ANGIOGRAPHY;  Surgeon:  Kathleene Hazel, MD;  Location: [facility];  Service: Cardiovascular;  Laterality: N/A;   LEFT HEART CATHETERIZATION WITH CORONARY ANGIOGRAM N/A 10/29/2011   Procedure: LEFT HEART CATHETERIZATION WITH CORONARY ANGIOGRAM;  Surgeon: Marykay Lex, MD;  Location: [facility] CATH LAB;  Service: Cardiovascular;  Laterality: N/A;   ROBOTIC ASSISTED LAP VAGINAL HYSTERECTOMY  09/14/2011   Procedure: ROBOTIC ASSISTED LAPAROSCOPIC VAGINAL HYSTERECTOMY;  Surgeon: Roseanna Rainbow, MD;  Location: [facility];  Service: Gynecology;  Laterality: N/A;   TUBAL LIGATION      OB History     Gravida  2   Para  2   Term      Preterm      AB      Living         SAB      IAB      Ectopic      Multiple      Live Births               Home Medications    Prior to Admission medications   Medication Sig Start Date End Date Taking? Authorizing Provider  azithromycin (ZITHROMAX) 250 MG tablet Take 1 tablet (250 mg total) by mouth daily. Take first 2 tablets together, then 1 every day until finished. 03/03/23  Yes Radford Pax, NP  acetaminophen (TYLENOL) 500 MG tablet Take 1,000 mg by mouth every 6 (six) hours as needed for moderate pain or headache.    [provider]  albuterol (VENTOLIN HFA) 108 (90 Base) MCG/ACT inhaler Inhale 2 puffs into the lungs every 6 (six) hours as needed for wheezing or shortness of breath.    [provider]  amLODipine (NORVASC) 5 MG tablet Take 1 tablet (5 mg total) by mouth daily. 12/19/21   [name], FNP  amoxicillin-clavulanate (AUGMENTIN) 875-125 MG tablet Take 1 tablet by mouth every 12 (twelve) hours. 02/25/23   Radford Pax, NP  aspirin EC 81 MG EC tablet Take 1 tablet (81 mg total) by mouth daily. Swallow whole. 04/18/21   Kathlen Mody, MD  atorvastatin (LIPITOR) 10 MG tablet Take 1 tab by mouth MWF 06/27/22   [name], FNP  Blood Glucose Monitoring Suppl (ONETOUCH VERIO) w/Device KIT Use as directed to check blood sugars  2 times per day dx: e11.65 04/20/21   Charlesetta Ivory, NP  busPIRone (BUSPAR) 10 MG tablet Take 1 tablet by mouth twice daily 02/06/23   [name], FNP  cycloSPORINE (RESTASIS) 0.05 % ophthalmic emulsion Place 1 drop into both eyes daily as needed (dry eye). 05/29/22   [name], FNP  glucose blood (ONETOUCH VERIO) test strip Use as directed to check blood sugars 2 times per day dx: e11.65 04/20/21   Charlesetta Ivory, NP  levocetirizine (XYZAL) 5 MG tablet TAKE 1 TABLET BY MOUTH ONCE DAILY IN THE EVENING 01/29/23   [name], FNP  metFORMIN (GLUCOPHAGE-XR) 750 MG 24 hr tablet Take 1 tablet by mouth once daily with breakfast 02/06/23   [name], FNP  Multiple Vitamin (MULTIVITAMIN WITH MINERALS) TABS tablet Take 1 tablet  by mouth daily.    [provider]  nitroGLYCERIN (NITROSTAT) 0.4 MG SL tablet Place 1 tablet (0.4 mg total) under the tongue every 5 (five) minutes as needed for chest pain. 03/23/22   Meriam Sprague, MD  omeprazole (PRILOSEC) 40 MG capsule Take 40 mg by mouth daily. 09/02/22   [provider]  OneTouch Delica Lancets 33G MISC Use as directed to check blood sugars 2 times per day dx: e11.65 04/20/21   Charlesetta Ivory, NP  pregabalin (LYRICA) 150 MG capsule Take 1 capsule by mouth twice daily 02/06/23   [name], FNP  promethazine-dextromethorphan (PROMETHAZINE-DM) 6.25-15 MG/5ML syrup Take 5 mLs by mouth 4 (four) times daily as needed for cough. 02/25/23   Radford Pax, NP    Family History Family History  Problem Relation Age of Onset   Breast cancer Mother 66   Breast cancer Paternal Grandmother    Heart failure Neg Hx    Coronary artery disease Neg Hx     Social History Social History   Tobacco Use   Smoking status: Former    Packs/day: 0.25    Years: 10.00    Additional pack years: 0.00    Total pack years: 2.50    Types: Cigarettes    Quit date: 1997    Years since quitting: 27.4   Smokeless tobacco: Never  Vaping Use    Vaping Use: Never used  Substance Use Topics   Alcohol use: No   Drug use: No     Allergies   Codeine   Review of Systems Review of Systems  HENT:  Positive for congestion and sore throat.   Respiratory:  Positive for cough.   Musculoskeletal:  Positive for myalgias.  Neurological:  Positive for headaches.     Physical Exam Triage Vital Signs ED Triage Vitals  Enc Vitals Group     BP 03/03/23 0813 130/86     Pulse Rate 03/03/23 0813 (!) 105     Resp 03/03/23 0813 20     Temp 03/03/23 0813 (!) 100.5 F (38.1 C)     Temp Source 03/03/23 0813 Oral     SpO2 03/03/23 0813 95 %     Weight --      Height --      Head Circumference --      Peak Flow --      Pain Score 03/03/23 0816 0     Pain Loc --      Pain Edu? --      Excl. in GC? --    No data found.  Updated Vital Signs BP 130/86 (BP Location: Right Arm)   Pulse (!) 105   Temp (!) 100.5 F (38.1 C) (Oral)   Resp 20   LMP 08/04/2011   SpO2 95%   Visual Acuity Right Eye Distance:   Left Eye Distance:   Bilateral Distance:    Right Eye Near:   Left Eye Near:    Bilateral Near:     Physical Exam Vitals and nursing note reviewed.  Constitutional:      General: She is not in acute distress.    Appearance: She is well-developed. She is not ill-appearing.  HENT:     Head: Normocephalic and atraumatic.     Right Ear: Tympanic membrane and ear canal normal.     Left Ear: Tympanic membrane and ear canal normal.     Nose: Congestion present.     Mouth/Throat:     Mouth: Mucous membranes  are moist.     Pharynx: Oropharynx is clear. Uvula midline. Posterior oropharyngeal erythema present.     Tonsils: No tonsillar exudate or tonsillar abscesses.  Eyes:     Conjunctiva/sclera: Conjunctivae normal.     Pupils: Pupils are equal, round, and reactive to light.  Cardiovascular:     Rate and Rhythm: Normal rate and regular rhythm.     Heart sounds: Normal heart sounds.  Pulmonary:     Effort: Pulmonary  effort is normal.     Breath sounds: Normal breath sounds.  Musculoskeletal:     Cervical back: Normal range of motion and neck supple.  Lymphadenopathy:     Cervical: No cervical adenopathy.  Skin:    General: Skin is warm and dry.  Neurological:     General: No focal deficit present.     Mental Status: She is alert and oriented to person, place, and time.  Psychiatric:        Mood and Affect: Mood normal.        Behavior: Behavior normal.      UC Treatments / Results  Labs (all labs ordered are listed, but only abnormal results are displayed) Labs Reviewed  SARS CORONAVIRUS 2 (TAT 6-24 HRS)  POCT RAPID STREP A (OFFICE)  POCT INFLUENZA A/B    EKG   Radiology No results found.  Procedures Procedures (including critical care time)  Medications Ordered in UC Medications  acetaminophen (TYLENOL) tablet 650 mg (650 mg Oral Given 03/03/23 9147)    Initial Impression / Assessment and Plan / UC Course  I have reviewed the triage vital signs and the nursing notes.  Pertinent labs & imaging results that were available during my care of the patient were reviewed by me and considered in my medical decision making (see chart for details).     Reviewed exam and symptoms with patient.  Negative rapid strep and flu.  Will send COVID PCR and contact if positive. No x-ray available in clinic today.  Concern for may be pneumonia given her new onset fever.  She will continue Augmentin as previously prescribed and we will add on Zithromax to cover any atypicals She was also given Tylenol in the clinic for fever and may continue this at home for fever/body aches Continue Promethazine DM as needed for cough, refill sent to pharmacy PCP follow-up 2 days for recheck ER precautions reviewed and patient verbalized understanding Final Clinical Impressions(s) / UC Diagnoses   Final diagnoses:  Sore throat  Acute bacterial bronchitis     Discharge Instructions      Your rapid flu  and rapid strep test are negative.  We will send your COVID test out to the lab and contact you if this comes back positive Continue the Augmentin as previously prescribed and we will add on Zithromax  Continue Tylenol as needed for fever/body aches Continue Promethazine DM as needed for cough Please follow-up with your PCP in 2 days for recheck Please go to the ER if you develop any worsening symptoms     ED Prescriptions     Medication Sig Dispense Auth. Provider   azithromycin (ZITHROMAX) 250 MG tablet Take 1 tablet (250 mg total) by mouth daily. Take first 2 tablets together, then 1 every day until finished. 6 tablet Radford Pax, NP      PDMP not reviewed this encounter.   Radford Pax, NP 03/03/23 0905    Radford Pax, NP 03/03/23 [phone]

## 2023-03-03 NOTE — Discharge Instructions (Signed)
Your rapid flu and rapid strep test are negative.  We will send your COVID test out to the lab and contact you if this comes back positive Continue the Augmentin as previously prescribed and we will add on Zithromax  Continue Tylenol as needed for fever/body aches Continue Promethazine DM as needed for cough Please follow-up with your PCP in 2 days for recheck Please go to the ER if you develop any worsening symptoms

## 2023-03-04 LAB — SARS CORONAVIRUS 2 (TAT 6-24 HRS): SARS Coronavirus 2: POSITIVE — AB

## 2023-03-05 ENCOUNTER — Encounter: Payer: Self-pay | Admitting: Nurse Practitioner

## 2023-03-05 ENCOUNTER — Other Ambulatory Visit: Payer: Self-pay | Admitting: Nurse Practitioner

## 2023-03-05 ENCOUNTER — Other Ambulatory Visit: Payer: Self-pay

## 2023-03-05 DIAGNOSIS — U071 COVID-19: Secondary | ICD-10-CM

## 2023-03-05 MED ORDER — NIRMATRELVIR/RITONAVIR (PAXLOVID)TABLET
3.0000 | ORAL_TABLET | Freq: Two times a day (BID) | ORAL | 0 refills | Status: AC
Start: 2023-03-05 — End: 2023-03-10

## 2023-03-05 MED ORDER — NIRMATRELVIR/RITONAVIR (PAXLOVID)TABLET
3.0000 | ORAL_TABLET | Freq: Two times a day (BID) | ORAL | 0 refills | Status: DC
Start: 2023-03-05 — End: 2023-03-05

## 2023-03-13 ENCOUNTER — Encounter: Payer: Self-pay | Admitting: Nurse Practitioner

## 2023-04-22 ENCOUNTER — Other Ambulatory Visit: Payer: Self-pay | Admitting: Nurse Practitioner

## 2023-04-22 DIAGNOSIS — F419 Anxiety disorder, unspecified: Secondary | ICD-10-CM

## 2023-05-20 ENCOUNTER — Other Ambulatory Visit: Payer: Self-pay | Admitting: Nurse Practitioner

## 2023-05-20 DIAGNOSIS — R0982 Postnasal drip: Secondary | ICD-10-CM

## 2023-05-26 ENCOUNTER — Other Ambulatory Visit: Payer: Self-pay | Admitting: Nurse Practitioner

## 2023-05-26 DIAGNOSIS — F419 Anxiety disorder, unspecified: Secondary | ICD-10-CM

## 2023-05-28 ENCOUNTER — Encounter: Payer: Self-pay | Admitting: Nurse Practitioner

## 2023-05-28 ENCOUNTER — Other Ambulatory Visit: Payer: Self-pay | Admitting: Nurse Practitioner

## 2023-05-28 MED ORDER — PREGABALIN 150 MG PO CAPS: 150.0000 mg | ORAL_CAPSULE | Freq: Two times a day (BID) | ORAL | 0 refills | Status: DC

## 2023-06-22 ENCOUNTER — Other Ambulatory Visit: Payer: Self-pay | Admitting: Nurse Practitioner

## 2023-06-22 DIAGNOSIS — F419 Anxiety disorder, unspecified: Secondary | ICD-10-CM

## 2023-07-01 ENCOUNTER — Other Ambulatory Visit: Payer: Self-pay

## 2023-07-15 ENCOUNTER — Encounter: Payer: Self-pay | Admitting: Nurse Practitioner

## 2023-07-16 ENCOUNTER — Ambulatory Visit (INDEPENDENT_AMBULATORY_CARE_PROVIDER_SITE_OTHER): Payer: BC Managed Care – PPO

## 2023-07-16 ENCOUNTER — Ambulatory Visit
Admission: EM | Admit: 2023-07-16 | Discharge: 2023-07-16 | Disposition: A | Discharge status: Home / Self Care | Payer: BC Managed Care – PPO | Attending: Internal Medicine | Admitting: Internal Medicine

## 2023-07-16 DIAGNOSIS — S20212A Contusion of left front wall of thorax, initial encounter: Secondary | ICD-10-CM

## 2023-07-16 DIAGNOSIS — R0781 Pleurodynia: Secondary | ICD-10-CM

## 2023-07-16 DIAGNOSIS — J209 Acute bronchitis, unspecified: Secondary | ICD-10-CM | POA: Diagnosis not present

## 2023-07-16 MED ORDER — IBUPROFEN 600 MG PO TABS
600.0000 mg | ORAL_TABLET | Freq: Four times a day (QID) | ORAL | 0 refills | Status: DC | PRN
Start: 2023-07-16 — End: 2023-07-30

## 2023-07-16 MED ORDER — AZITHROMYCIN 250 MG PO TABS
250.0000 mg | ORAL_TABLET | Freq: Every day | ORAL | 0 refills | Status: DC
Start: 2023-07-16 — End: 2023-07-30

## 2023-07-16 NOTE — Discharge Instructions (Addendum)
You may take ibuprofen every 6 hours as needed for rib pain.  Start azithromycin as prescribed.  Use over-the-counter cough medicine as needed.  Splint the ribs if you know you are going to cough or sneeze.  You can alternate cool and warm compresses as well.  Please follow-up with your PCP at your scheduled appointment on November 6.  Please go to the ER if you develop any worsening symptoms which include but are not limited to coughing up blood, shortness of breath, or any new concerns that arise.  I hope you feel better soon!

## 2023-07-16 NOTE — ED Provider Notes (Signed)
[facility]    CSN: [id number] Arrival date & time: 07/16/23  1012      History   Chief Complaint No chief complaint on file.   HPI Lisa Casey is a 62 y.o. female presents for rib pain and cough.  Patient reports 2 weeks ago she leaned over a wooden armrest on her left side and heard a pop and since then has been having mid left lateral rib pain that radiates around to her left breast.  Denies any bruising, swelling, deformity.  Does endorse pain with deep breathing and movement.  No hemoptysis or shortness of breath.  No history of rib fractures in the past.  In addition she reports 1 week of nasal congestion, headache, cough.  Denies any fevers, sore throat, ear pain, body aches, nausea/vomiting/diarrhea.  No asthma or smoking history.  She has been taking Tylenol for her symptoms as well as her Lyrica which she takes for fibromyalgia.  Reports negative home COVID test.  No other concerns at this time.  HPI  Past Medical History:  Diagnosis Date   Anxiety    HX OF ANXIETY ATTACKS   Back pain, chronic    Bronchitis    Cholelithiasis 04/15/2021   Diabetes (HCC)    started Metformin in 02/2021   Fibromyalgia    GERD (gastroesophageal reflux disease)    H/O total hysterectomy 07/05/2022   Cervix removed per patient   History of kidney stones    Hypertension    Migraines    HX OF MIGRAINES   Myocardial infarction [facility])    july 2022   Obesity    Pneumonia    06-2021   Shortened PR interval     Patient Active Problem List   Diagnosis Date Noted   Chronic right-sided low back pain without sciatica 11/06/2022   Feeling stressed out 11/06/2022   Constipation 11/05/2022   Family history of malignant neoplasm of digestive organs 11/05/2022   Rectal bleeding 11/05/2022   Sleep apnea 11/05/2022   H/O total hysterectomy 07/05/2022   Choledocholithiasis 05/28/2022   Abdominal pain 05/26/2022   Class 1 obesity due to excess calories without serious comorbidity  with body mass index (BMI) of 31.0 to 31.9 in adult 05/13/2022   Dry eyes, bilateral 05/13/2022   Atherosclerosis 05/13/2022   Diabetes mellitus (HCC) 07/19/2021   Hyperlipidemia 07/19/2021   Adjustment disorder with anxiety 05/25/2021   Elevated troponin    NSTEMI (non-ST elevated myocardial infarction) (HCC) 04/15/2021   Fibromyalgia 04/15/2021   Headache 04/15/2021   Cholelithiasis 04/15/2021   Anxiety 04/15/2021   Prediabetes 04/07/2021   Essential hypertension 08/09/2020   Abnormal glucose 08/09/2020   Obesity (BMI 30-39.9) 08/09/2020   Trigger finger, right middle finger 06/22/2020   Bilateral foot pain 03/31/2019   Palpitations 02/23/2019   Plantar fasciitis, bilateral 02/23/2019   Chronic pain syndrome 10/27/2018   Major depression, recurrent, chronic (HCC) 09/23/2018   Leiomyoma of uterus, unspecified 09/14/2011    Past Surgical History:  Procedure Laterality Date   CONSTIPATION     CYSTOSCOPY W/ URETERAL STENT PLACEMENT  09/23/2012   Procedure: CYSTOSCOPY WITH RETROGRADE PYELOGRAM/URETERAL STENT PLACEMENT;  Surgeon: Sebastian Ache, MD;  Location: [facility];  Service: Urology;  Laterality: Right;   CYSTOSCOPY WITH RETROGRADE PYELOGRAM, URETEROSCOPY AND STENT PLACEMENT  10/08/2012   Procedure: CYSTOSCOPY WITH RETROGRADE PYELOGRAM, URETEROSCOPY AND STENT PLACEMENT;  Surgeon: Sebastian Ache, MD;  Location: [facility];  Service: Urology;  Laterality: Right;  90 MIN NO RETROGRADE NEEDS  FLEX URETEROSCOPE    CYSTOSCOPY/URETEROSCOPY/HOLMIUM LASER/STENT PLACEMENT Right 08/21/2021   Procedure: CYSTOSCOPY RIGHT /URETEROSCOPY/HOLMIUM LASER/STENT PLACEMENT;  Surgeon: Crista Elliot, MD;  Location: [facility];  Service: Urology;  Laterality: Right;   HOLMIUM LASER APPLICATION  10/08/2012   Procedure: HOLMIUM LASER APPLICATION;  Surgeon: Sebastian Ache, MD;  Location: [facility];  Service: Urology;  Laterality: Right;   LEFT HEART CATH AND CORONARY ANGIOGRAPHY  N/A 04/17/2021   Procedure: LEFT HEART CATH AND CORONARY ANGIOGRAPHY;  Surgeon: Kathleene Hazel, MD;  Location: [facility];  Service: Cardiovascular;  Laterality: N/A;   LEFT HEART CATHETERIZATION WITH CORONARY ANGIOGRAM N/A 10/29/2011   Procedure: LEFT HEART CATHETERIZATION WITH CORONARY ANGIOGRAM;  Surgeon: Marykay Lex, MD;  Location: [facility] C[facility];  Service: Cardiovascular;  Laterality: N/A;   ROBOTIC ASSISTED LAP VAGINAL HYSTERECTOMY  09/14/2011   Procedure: ROBOTIC ASSISTED LAPAROSCOPIC VAGINAL HYSTERECTOMY;  Surgeon: Roseanna Rainbow, MD;  Location: [facility];  Service: Gynecology;  Laterality: N/A;   TUBAL LIGATION      OB History     Gravida  2   Para  2   Term      Preterm      AB      Living         SAB      IAB      Ectopic      Multiple      Live Births               Home Medications    Prior to Admission medications   Medication Sig Start Date End Date Taking? Authorizing Provider  azithromycin (ZITHROMAX) 250 MG tablet Take 1 tablet (250 mg total) by mouth daily. Take first 2 tablets together, then 1 every day until finished. 07/16/23  Yes Radford Pax, NP  ibuprofen (ADVIL) 600 MG tablet Take 1 tablet (600 mg total) by mouth every 6 (six) hours as needed (rib pain). 07/16/23  Yes Radford Pax, NP  acetaminophen (TYLENOL) 500 MG tablet Take 1,000 mg by mouth every 6 (six) hours as needed for moderate pain or headache.    [provider]  albuterol (VENTOLIN HFA) 108 (90 Base) MCG/ACT inhaler Inhale 2 puffs into the lungs every 6 (six) hours as needed for wheezing or shortness of breath.    [provider]  amLODipine (NORVASC) 5 MG tablet Take 1 tablet by mouth once daily 05/21/23   [name], FNP  amoxicillin-clavulanate (AUGMENTIN) 875-125 MG tablet Take 1 tablet by mouth every 12 (twelve) hours. 02/25/23   Radford Pax, NP  aspirin EC 81 MG EC tablet Take 1 tablet (81 mg total) by mouth daily. Swallow whole.  04/18/21   Kathlen Mody, MD  atorvastatin (LIPITOR) 10 MG tablet Take 1 tab by mouth MWF 06/27/22   [name], FNP  Blood Glucose Monitoring Suppl (ONETOUCH VERIO) w/Device KIT Use as directed to check blood sugars 2 times per day dx: e11.65 04/20/21   Charlesetta Ivory, NP  busPIRone (BUSPAR) 10 MG tablet Take 1 tablet by mouth twice daily 06/26/23   [name], FNP  cycloSPORINE (RESTASIS) 0.05 % ophthalmic emulsion Place 1 drop into both eyes daily as needed (dry eye). 05/29/22   [name], FNP  glucose blood (ONETOUCH VERIO) test strip Use as directed to check blood sugars 2 times per day dx: e11.65 04/20/21   Charlesetta Ivory, NP  levocetirizine (XYZAL) 5 MG tablet TAKE 1 TABLET BY MOUTH ONCE  DAILY IN THE EVENING 05/21/23   [name], FNP  metFORMIN (GLUCOPHAGE-XR) 750 MG 24 hr tablet Take 1 tablet by mouth once daily with breakfast 02/06/23   [name], FNP  Multiple Vitamin (MULTIVITAMIN WITH MINERALS) TABS tablet Take 1 tablet by mouth daily.    [provider]  nitroGLYCERIN (NITROSTAT) 0.4 MG SL tablet Place 1 tablet (0.4 mg total) under the tongue every 5 (five) minutes as needed for chest pain. 03/23/22   Meriam Sprague, MD  omeprazole (PRILOSEC) 40 MG capsule Take 40 mg by mouth daily. 09/02/22   [provider]  OneTouch Delica Lancets 33G MISC Use as directed to check blood sugars 2 times per day dx: e11.65 04/20/21   Charlesetta Ivory, NP  pregabalin (LYRICA) 150 MG capsule Take 1 capsule (150 mg total) by mouth 2 (two) times daily. 05/28/23   [name], FNP  promethazine-dextromethorphan (PROMETHAZINE-DM) 6.25-15 MG/5ML syrup Take 5 mLs by mouth 4 (four) times daily as needed for cough. 03/03/23   Radford Pax, NP    Family History Family History  Problem Relation Age of Onset   Breast cancer Mother 5   Breast cancer Paternal Grandmother    Heart failure Neg Hx    Coronary artery disease Neg Hx     Social History Social History    Tobacco Use   Smoking status: Former    Current packs/day: 0.00    Average packs/day: 0.3 packs/day for 10.0 years (2.5 ttl pk-yrs)    Types: Cigarettes    Start date: 7    Quit date: 1997    Years since quitting: 27.8   Smokeless tobacco: Never  Vaping Use   Vaping status: Never Used  Substance Use Topics   Alcohol use: No   Drug use: No     Allergies   Codeine   Review of Systems Review of Systems  HENT:  Positive for congestion.   Respiratory:  Positive for cough.   Musculoskeletal:        Left rib pain      Physical Exam Triage Vital Signs ED Triage Vitals  Encounter Vitals Group     BP 07/16/23 1036 (!) 154/85     Systolic BP Percentile --      Diastolic BP Percentile --      Pulse Rate 07/16/23 1036 78     Resp 07/16/23 1036 16     Temp 07/16/23 1036 98.8 F (37.1 C)     Temp Source 07/16/23 1036 Oral     SpO2 07/16/23 1036 98 %     Weight --      Height --      Head Circumference --      Peak Flow --      Pain Score 07/16/23 1035 8     Pain Loc --      Pain Education --      Exclude from Growth Chart --    No data found.  Updated Vital Signs BP (!) 154/85 (BP Location: Left Arm)   Pulse 78   Temp 98.8 F (37.1 C) (Oral)   Resp 16   LMP 08/04/2011   SpO2 98%   Visual Acuity Right Eye Distance:   Left Eye Distance:   Bilateral Distance:    Right Eye Near:   Left Eye Near:    Bilateral Near:     Physical Exam Vitals and nursing note reviewed.  Constitutional:      General: She is not in acute  distress.    Appearance: She is well-developed. She is not ill-appearing.  HENT:     Head: Normocephalic and atraumatic.     Right Ear: Tympanic membrane and ear canal normal.     Left Ear: Tympanic membrane and ear canal normal.     Nose: Congestion present.     Right Turbinates: Not pale.     Left Turbinates: Not pale.     Right Sinus: No maxillary sinus tenderness or frontal sinus tenderness.     Left Sinus: No maxillary sinus  tenderness or frontal sinus tenderness.     Mouth/Throat:     Mouth: Mucous membranes are moist.     Pharynx: Oropharynx is clear. Uvula midline. No oropharyngeal exudate or posterior oropharyngeal erythema.     Tonsils: No tonsillar exudate or tonsillar abscesses.  Eyes:     Conjunctiva/sclera: Conjunctivae normal.     Pupils: Pupils are equal, round, and reactive to light.  Cardiovascular:     Rate and Rhythm: Normal rate and regular rhythm.     Heart sounds: Normal heart sounds.  Pulmonary:     Effort: Pulmonary effort is normal. No respiratory distress.     Breath sounds: Normal breath sounds. No wheezing.  Chest:       Comments: There is no swelling, ecchymosis, deformity/step-off of the left ribs.  Moderate tenderness to palpation to left lateral ribs that does extend to left anterior lower ribs.  Full chest expansion bilaterally. Musculoskeletal:     Cervical back: Normal range of motion and neck supple.  Lymphadenopathy:     Cervical: No cervical adenopathy.  Skin:    General: Skin is warm and dry.  Neurological:     General: No focal deficit present.     Mental Status: She is alert and oriented to person, place, and time.  Psychiatric:        Mood and Affect: Mood normal.        Behavior: Behavior normal.      UC Treatments / Results  Labs (all labs ordered are listed, but only abnormal results are displayed) Labs Reviewed - No data to display  EKG   Radiology No results found.  Procedures Procedures (including critical care time)  Medications Ordered in UC Medications - No data to display  Initial Impression / Assessment and Plan / UC Course  I have reviewed the triage vital signs and the nursing notes.  Pertinent labs & imaging results that were available during my care of the patient were reviewed by me and considered in my medical decision making (see chart for details).     Reviewed exam and symptoms with patient.  Wet read of x-ray without  obvious rib fracture or consolidation.  Will contact patient with for any positive results with radiology overread.  Will treat as a rib contusion.  Concern for cough x 1 week with reduced deep breathing secondary to rib pain.  Will start Zithromax.  Rx ibuprofen as needed for rib pain.  Discussed splinting.  Patient has a PCP follow-up on November 6.  Strict ER precautions reviewed and patient verbalized understanding. Final Clinical Impressions(s) / UC Diagnoses   Final diagnoses:  Rib pain on left side  Contusion of rib on left side, initial encounter  Acute bronchitis, unspecified organism     Discharge Instructions      You may take ibuprofen every 6 hours as needed for rib pain.  Start azithromycin as prescribed.  Use over-the-counter cough medicine as needed.  Splint  the ribs if you know you are going to cough or sneeze.  You can alternate cool and warm compresses as well.  Please follow-up with your PCP at your scheduled appointment on November 6.  Please go to the ER if you develop any worsening symptoms which include but are not limited to coughing up blood, shortness of breath, or any new concerns that arise.  I hope you feel better soon!     ED Prescriptions     Medication Sig Dispense Auth. Provider   ibuprofen (ADVIL) 600 MG tablet Take 1 tablet (600 mg total) by mouth every 6 (six) hours as needed (rib pain). 30 tablet Radford Pax, NP   azithromycin (ZITHROMAX) 250 MG tablet Take 1 tablet (250 mg total) by mouth daily. Take first 2 tablets together, then 1 every day until finished. 6 tablet Radford Pax, NP      PDMP not reviewed this encounter.   Radford Pax, NP 07/16/23 [phone]

## 2023-07-16 NOTE — ED Triage Notes (Signed)
Pt presents to UC w/ left sided rib pain for 2 weeks after she "leaned over a wooden arm rest." Pt reports it hurts to lift her left arm and to breath hard. Pt reports 1 week of nasal congestion, headache, cough. Home covid negative Took tylenol for symptoms

## 2023-07-22 ENCOUNTER — Encounter: Payer: BC Managed Care – PPO | Admitting: Nurse Practitioner

## 2023-07-23 NOTE — Progress Notes (Signed)
Lisa Casey, CMA,acting as a Neurosurgeon for Lisa Felts, FNP.,have documented all relevant documentation on the behalf of Lisa Felts, FNP,as directed by  Lisa Felts, FNP while in the presence of Lisa Felts, FNP.  Subjective:    Patient ID: Lisa Casey , female    DOB: November 26, 1960 , 62 y.o.   MRN: [id number]  Chief Complaint  Patient presents with   Annual Exam    HPI  Patient presents today for HM, Patient reports compliance with medication. Patient denies any chest pain, SOB, or headaches. Patient reports she had a chest injury on 07/16/2023 and she wants to go back to work but needs a work note.She reports her chest is still sore but not horrible. She reports she has been having headaches about 5days out of the week. She reports it last all day. She reports she is very stressed and thinks she having issues and with depression and anxiety. She also has 2 bruise on her lower left leg she would like looked at, she reports she hit her leg on something.  She is being followed by Ophthalmology for dry eye - Dr. Moise Boring. They are talking about drawing her labs and making an eye drop. She has not had a sleep study.  She is uncertain if she wants the procedure to help close her eyelids better   She was sitting on the couch and the padding  was gone and she leaned on the area. She heard a pop. When she took a breath she would have pain. She went to the ER.  She is scheduled to see a therapist on November 12th for the first time she realizes she needs the help. Lisa Casey. Her son has someone to take care of him during the day.      Past Medical History:  Diagnosis Date   Anxiety    HX OF ANXIETY ATTACKS   Back pain, chronic    Bronchitis    Cholelithiasis 04/15/2021   Diabetes (HCC)    started Metformin in 02/2021   Fibromyalgia    GERD (gastroesophageal reflux disease)    H/O total hysterectomy 07/05/2022   Cervix removed per patient   History of kidney stones     Hypertension    Migraines    HX OF MIGRAINES   Myocardial infarction [facility])    july 2022   Obesity    Pneumonia    06-2021   Shortened PR interval      Family History  Problem Relation Age of Onset   Breast cancer Mother 86   Breast cancer Paternal Grandmother    Heart failure Neg Hx    Coronary artery disease Neg Hx      Current Outpatient Medications:    acetaminophen (TYLENOL) 500 MG tablet, Take 1,000 mg by mouth every 6 (six) hours as needed for moderate pain or headache., Disp: , Rfl:    albuterol (VENTOLIN HFA) 108 (90 Base) MCG/ACT inhaler, Inhale 2 puffs into the lungs every 6 (six) hours as needed for wheezing or shortness of breath., Disp: , Rfl:    amLODipine (NORVASC) 5 MG tablet, Take 1 tablet by mouth once daily, Disp: 90 tablet, Rfl: 0   aspirin EC 81 MG EC tablet, Take 1 tablet (81 mg total) by mouth daily. Swallow whole. (Patient not taking: Reported on 07/30/2023), Disp: 30 tablet, Rfl: 11   atorvastatin (LIPITOR) 10 MG tablet, Take 1 tab by mouth MWF (Patient not taking: Reported on 07/30/2023), Disp: 45 tablet, Rfl: 1  Azelastine HCl 0.15 % SOLN, Place 2 sprays into the nose as needed., Disp: 3 mL, Rfl: 1   Blood Glucose Monitoring Suppl (ONETOUCH VERIO) w/Device KIT, Use as directed to check blood sugars 2 times per day dx: e11.65, Disp: 1 kit, Rfl: 1   glucose blood (ONETOUCH VERIO) test strip, Use as directed to check blood sugars 2 times per day dx: e11.65, Disp: 100 each, Rfl: 5   levocetirizine (XYZAL) 5 MG tablet, TAKE 1 TABLET BY MOUTH ONCE DAILY IN THE EVENING, Disp: 90 tablet, Rfl: 0   metFORMIN (GLUCOPHAGE-XR) 750 MG 24 hr tablet, Take 1 tablet by mouth once daily with breakfast (Patient not taking: Reported on 07/30/2023), Disp: 30 tablet, Rfl: 0   Multiple Vitamin (MULTIVITAMIN WITH MINERALS) TABS tablet, Take 1 tablet by mouth daily., Disp: , Rfl:    nitroGLYCERIN (NITROSTAT) 0.4 MG SL tablet, Place 1 tablet (0.4 mg total) under the tongue every 5  (five) minutes as needed for chest pain., Disp: 25 tablet, Rfl: 4   omeprazole (PRILOSEC) 40 MG capsule, Take 40 mg by mouth daily. (Patient not taking: Reported on 07/30/2023), Disp: , Rfl:    OneTouch Delica Lancets 33G MISC, Use as directed to check blood sugars 2 times per day dx: e11.65, Disp: 100 each, Rfl: 5   pregabalin (LYRICA) 150 MG capsule, Take 1 capsule (150 mg total) by mouth 2 (two) times daily., Disp: 180 capsule, Rfl: 0   traZODone (DESYREL) 50 MG tablet, Take 1 tablet (50 mg total) by mouth at bedtime as needed for sleep. (Patient not taking: Reported on 07/30/2023), Disp: 30 tablet, Rfl: 2   hydrOXYzine (ATARAX) 25 MG tablet, Take 1 tablet (25 mg total) by mouth every 6 (six) hours as needed., Disp: 30 tablet, Rfl: 2   meloxicam (MOBIC) 15 MG tablet, Take 1 tablet (15 mg total) by mouth daily., Disp: 30 tablet, Rfl: 1   venlafaxine (EFFEXOR) 25 MG tablet, Take 1 tablet (25 mg total) by mouth 2 (two) times daily., Disp: 60 tablet, Rfl: 1   Allergies  Allergen Reactions   Codeine Itching      The patient states she is status post hysterectomy.  Patient's last menstrual period was 08/04/2011.   Negative for Dysmenorrhea and Negative for Menorrhagia. Negative for: breast discharge, breast lump(s), breast pain and breast self exam. Associated symptoms include abnormal vaginal bleeding. Pertinent negatives include abnormal bleeding (hematology), anxiety, decreased libido dyspareunia, history of infertility, nocturia, sexual dysfunction, urinary incontinence, urinary urgency, vaginal discharge and vaginal itching. Diet regular; will do well for a few weeks then will fall off. The patient states her exercise level is minimal - walking 2 times a week with her friends.   The patient's tobacco use is:  Social History   Tobacco Use  Smoking Status Former   Current packs/day: 0.00   Average packs/day: 0.3 packs/day for 10.0 years (2.5 ttl pk-yrs)   Types: Cigarettes   Start date: 22    Quit date: 1997   Years since quitting: 27.9  Smokeless Tobacco Never   She has been exposed to passive smoke. The patient's alcohol use is:  Social History   Substance and Sexual Activity  Alcohol Use No    Review of Systems  Constitutional: Negative.   HENT: Negative.    Eyes: Negative.   Respiratory: Negative.    Cardiovascular: Negative.   Gastrointestinal: Negative.   Endocrine: Negative.   Genitourinary: Negative.   Musculoskeletal: Negative.   Skin: Negative.   Allergic/Immunologic: Negative.  Neurological: Negative.   Hematological: Negative.   Psychiatric/Behavioral: Negative.       Today's Vitals   07/24/23 1030  BP: 120/76  Pulse: 83  Temp: 98.9 F (37.2 C)  TempSrc: Oral  Weight: 185 lb (83.9 kg)  Height: 5\' 5"  (1.651 m)  PainSc: 0-No pain   Body mass index is 30.79 kg/m.  Wt Readings from Last 3 Encounters:  08/01/23 186 lb 1.1 oz (84.4 kg)  07/30/23 186 lb (84.4 kg)  07/24/23 185 lb (83.9 kg)     Objective:  Physical Exam Vitals reviewed.  Constitutional:      General: She is not in acute distress.    Appearance: Normal appearance. She is well-developed. She is obese.  HENT:     Head: Normocephalic and atraumatic.     Right Ear: Hearing, tympanic membrane, ear canal and external ear normal. There is no impacted cerumen.     Left Ear: Hearing, tympanic membrane, ear canal and external ear normal. There is no impacted cerumen.     Nose: Nose normal.     Mouth/Throat:     Mouth: Mucous membranes are moist.  Eyes:     General: Lids are normal.     Extraocular Movements: Extraocular movements intact.     Conjunctiva/sclera: Conjunctivae normal.     Pupils: Pupils are equal, round, and reactive to light.     Funduscopic exam:    Right eye: No papilledema.        Left eye: No papilledema.  Neck:     Thyroid: No thyroid mass.     Vascular: No carotid bruit.  Cardiovascular:     Rate and Rhythm: Normal rate and regular rhythm.      Pulses: Normal pulses.     Heart sounds: Normal heart sounds. No murmur heard. Pulmonary:     Effort: Pulmonary effort is normal. No respiratory distress.     Breath sounds: Normal breath sounds. No wheezing.  Chest:     Chest wall: No mass.  Breasts:    Tanner Score is 5.     Right: Normal. No mass or tenderness.     Left: Normal. No mass or tenderness.  Abdominal:     General: Abdomen is flat. Bowel sounds are normal. There is no distension.     Palpations: Abdomen is soft.     Tenderness: There is no abdominal tenderness.  Genitourinary:    Comments: N/A Musculoskeletal:        General: No swelling. Normal range of motion.     Cervical back: Full passive range of motion without pain, normal range of motion and neck supple.     Right lower leg: No edema.     Left lower leg: No edema.  Lymphadenopathy:     Upper Body:     Right upper body: No supraclavicular, axillary or pectoral adenopathy.     Left upper body: No supraclavicular, axillary or pectoral adenopathy.  Skin:    General: Skin is warm and dry.     Capillary Refill: Capillary refill takes less than 2 seconds.  Neurological:     General: No focal deficit present.     Mental Status: She is alert and oriented to person, place, and time.     Cranial Nerves: No cranial nerve deficit.     Sensory: No sensory deficit.     Motor: No weakness.  Psychiatric:        Mood and Affect: Affect is flat and tearful.  Speech: Speech normal.        Behavior: Behavior normal.        Thought Content: Thought content normal.        Cognition and Memory: Cognition normal.        Judgment: Judgment normal.         Assessment And Plan:     Encounter for annual health examination Assessment & Plan: Behavior modifications discussed and diet history reviewed.   Pt will continue to exercise regularly and modify diet with low GI, plant based foods and decrease intake of processed foods.  Recommend intake of daily multivitamin,  Vitamin D, and calcium.  Recommend mammogram and colonoscopy for preventive screenings, as well as recommend immunizations that include influenza, TDAP, and Shingles     Encounter for screening mammogram for breast cancer Assessment & Plan: Pt instructed on Self Breast Exam.According to ACOG guidelines Women aged 26 and older are recommended to get an annual mammogram.referral placed    Orders: -     3D Screening Mammogram, Left and Right; Future  Essential hypertension Assessment & Plan: Blood pressure slightly elevated repeat is improved.  Continue current medications.  Orders: -     EKG 12-Lead -     POCT URINALYSIS DIP (CLINITEK) -     Microalbumin / creatinine urine ratio -     CBC with Differential/Platelet -     CMP14+EGFR  Prediabetes Assessment & Plan: She declines ever having a hemoglobin A1c greater than 6.4.  Will recheck hemoglobin A1c today.  Orders: -     Hemoglobin A1c  Anxiety Assessment & Plan: She continues to have anxiety and is interested in going to counseling I have already made a referral previously she is to contact them.  I will also start her on Effexor discussed side effects.   Major depression, recurrent, chronic (HCC) Assessment & Plan: Started her on Effexor.  She is very tearful today during the visit.  Depression screen score is a 17.      Contusion of rib on left side, subsequent encounter Assessment & Plan: She was seen in the ER continues to have some discomfort.  Advised this may take a little bit of time to heal.   Bruising Assessment & Plan: Will check her blood levels.   Need for zoster vaccination Assessment & Plan: Given in office  Orders: -     Varicella-zoster vaccine IM  Atherosclerosis Assessment & Plan: Continue statin, tolerating well.  Orders: -     Lipid panel  Psychophysiological insomnia Assessment & Plan: She is having difficulty with sleeping due to her mood, will treat with  trazodone  Orders: -     traZODone HCl; Take 1 tablet (50 mg total) by mouth at bedtime as needed for sleep. (Patient not taking: Reported on 07/30/2023)  Dispense: 30 tablet; Refill: 2  Bilateral impacted cerumen Assessment & Plan: Wax is removed by with lavage with elephant ear with 1/2 water and 1/2 peroxide. Instructions for home care to prevent wax buildup are given. r  Orders: -     Ear Lavage  Wound of left lower extremity, initial encounter  Other orders -     Azelastine HCl; Place 2 sprays into the nose as needed.  Dispense: 3 mL; Refill: 1     Return for 1 year physical, 6 month bp check. Patient was given opportunity to ask questions. Patient verbalized understanding of the plan and was able to repeat key elements of the plan. All questions were  answered to their satisfaction.   Lisa Felts, FNP  I, Lisa Felts, FNP, have reviewed all documentation for this visit. The documentation on 07/24/23 for the exam, diagnosis, procedures, and orders are all accurate and complete.

## 2023-07-24 ENCOUNTER — Ambulatory Visit: Payer: BC Managed Care – PPO | Admitting: Nurse Practitioner

## 2023-07-24 ENCOUNTER — Encounter: Payer: Self-pay | Admitting: Nurse Practitioner

## 2023-07-24 VITALS — BP 120/76 | HR 83 | Temp 98.9°F | Ht 65.0 in | Wt 185.0 lb

## 2023-07-24 DIAGNOSIS — I10 Essential (primary) hypertension: Secondary | ICD-10-CM | POA: Diagnosis not present

## 2023-07-24 DIAGNOSIS — Z0000 Encounter for general adult medical examination without abnormal findings: Secondary | ICD-10-CM | POA: Insufficient documentation

## 2023-07-24 DIAGNOSIS — Z1231 Encounter for screening mammogram for malignant neoplasm of breast: Secondary | ICD-10-CM | POA: Insufficient documentation

## 2023-07-24 DIAGNOSIS — I7090 Unspecified atherosclerosis: Secondary | ICD-10-CM

## 2023-07-24 DIAGNOSIS — Z23 Encounter for immunization: Secondary | ICD-10-CM

## 2023-07-24 DIAGNOSIS — H6123 Impacted cerumen, bilateral: Secondary | ICD-10-CM

## 2023-07-24 DIAGNOSIS — F419 Anxiety disorder, unspecified: Secondary | ICD-10-CM | POA: Diagnosis not present

## 2023-07-24 DIAGNOSIS — S20212D Contusion of left front wall of thorax, subsequent encounter: Secondary | ICD-10-CM

## 2023-07-24 DIAGNOSIS — F339 Major depressive disorder, recurrent, unspecified: Secondary | ICD-10-CM

## 2023-07-24 DIAGNOSIS — S298XXA Other specified injuries of thorax, initial encounter: Secondary | ICD-10-CM | POA: Insufficient documentation

## 2023-07-24 DIAGNOSIS — T148XXA Other injury of unspecified body region, initial encounter: Secondary | ICD-10-CM

## 2023-07-24 DIAGNOSIS — S20212A Contusion of left front wall of thorax, initial encounter: Secondary | ICD-10-CM | POA: Insufficient documentation

## 2023-07-24 DIAGNOSIS — R7303 Prediabetes: Secondary | ICD-10-CM

## 2023-07-24 DIAGNOSIS — S81802A Unspecified open wound, left lower leg, initial encounter: Secondary | ICD-10-CM

## 2023-07-24 DIAGNOSIS — F5104 Psychophysiologic insomnia: Secondary | ICD-10-CM

## 2023-07-24 LAB — POCT URINALYSIS DIP (CLINITEK)
Blood, UA: NEGATIVE
Glucose, UA: NEGATIVE mg/dL
Ketones, POC UA: NEGATIVE mg/dL
Nitrite, UA: NEGATIVE
POC PROTEIN,UA: NEGATIVE
Spec Grav, UA: >=1.03 — AB (ref 1.010–1.025)
Urobilinogen, UA: 0.2 U/dL
pH, UA: 6 (ref 5.0–8.0)

## 2023-07-24 MED ORDER — TRAZODONE HCL 50 MG PO TABS
50.0000 mg | ORAL_TABLET | Freq: Every evening | ORAL | 2 refills | Status: DC | PRN
Start: 2023-07-24 — End: 2023-08-28

## 2023-07-24 MED ORDER — VENLAFAXINE HCL 25 MG PO TABS
25.0000 mg | ORAL_TABLET | Freq: Two times a day (BID) | ORAL | 2 refills | Status: DC
Start: 2023-07-24 — End: 2023-08-05

## 2023-07-24 MED ORDER — AZELASTINE HCL 0.15 % NA SOLN: 2.0000 | NASAL | 1 refills | Status: AC | PRN

## 2023-07-25 LAB — CBC WITH DIFFERENTIAL/PLATELET
Basophils Absolute: 0 10*3/uL (ref 0.0–0.2)
Basos: 1 %
EOS (ABSOLUTE): 0.1 10*3/uL (ref 0.0–0.4)
Eos: 2 %
Hematocrit: 45.3 % (ref 34.0–46.6)
Hemoglobin: 14.3 g/dL (ref 11.1–15.9)
Immature Grans (Abs): 0 10*3/uL (ref 0.0–0.1)
Immature Granulocytes: 0 %
Lymphocytes Absolute: 2 10*3/uL (ref 0.7–3.1)
Lymphs: 53 %
MCH: 30 pg (ref 26.6–33.0)
MCHC: 31.6 g/dL (ref 31.5–35.7)
MCV: 95 fL (ref 79–97)
Monocytes Absolute: 0.3 10*3/uL (ref 0.1–0.9)
Monocytes: 7 %
Neutrophils Absolute: 1.4 10*3/uL (ref 1.4–7.0)
Neutrophils: 37 %
Platelets: 219 10*3/uL (ref 150–450)
RBC: 4.77 x10E6/uL (ref 3.77–5.28)
RDW: 11.7 % (ref 11.7–15.4)
WBC: 3.9 10*3/uL (ref 3.4–10.8)

## 2023-07-25 LAB — CMP14+EGFR
ALT: 8 [IU]/L (ref 0–32)
AST: 16 [IU]/L (ref 0–40)
Albumin: 4.5 g/dL (ref 3.9–4.9)
Alkaline Phosphatase: 68 [IU]/L (ref 44–121)
BUN/Creatinine Ratio: 22 (ref 12–28)
BUN: 18 mg/dL (ref 8–27)
Bilirubin Total: 0.8 mg/dL (ref 0.0–1.2)
CO2: 26 mmol/L (ref 20–29)
Calcium: 9.7 mg/dL (ref 8.7–10.3)
Chloride: 104 mmol/L (ref 96–106)
Creatinine, Ser: 0.83 mg/dL (ref 0.57–1.00)
Globulin, Total: 2.3 g/dL (ref 1.5–4.5)
Glucose: 98 mg/dL (ref 70–99)
Potassium: 4.7 mmol/L (ref 3.5–5.2)
Sodium: 143 mmol/L (ref 134–144)
Total Protein: 6.8 g/dL (ref 6.0–8.5)
eGFR: 80 mL/min/{1.73_m2} (ref >59)

## 2023-07-25 LAB — HEMOGLOBIN A1C
Est. average glucose Bld gHb Est-mCnc: 134 mg/dL
Hgb A1c MFr Bld: 6.3 % — ABNORMAL HIGH (ref 4.8–5.6)

## 2023-07-25 LAB — LIPID PANEL
Chol/HDL Ratio: 2.1 ratio (ref 0.0–4.4)
Cholesterol, Total: 164 mg/dL (ref 100–199)
HDL: 77 mg/dL (ref >39)
LDL Chol Calc (NIH): 69 mg/dL (ref 0–99)
Triglycerides: 100 mg/dL (ref 0–149)
VLDL Cholesterol Cal: 18 mg/dL (ref 5–40)

## 2023-07-25 LAB — MICROALBUMIN / CREATININE URINE RATIO
Creatinine, Urine: 212.3 mg/dL
Microalb/Creat Ratio: 10 mg/g{creat} (ref 0–29)
Microalbumin, Urine: 20.8 ug/mL

## 2023-07-29 ENCOUNTER — Encounter: Payer: Self-pay | Admitting: Nurse Practitioner

## 2023-07-30 ENCOUNTER — Ambulatory Visit: Payer: BC Managed Care – PPO | Admitting: Family Medicine

## 2023-07-30 ENCOUNTER — Encounter: Payer: Self-pay | Admitting: Family Medicine

## 2023-07-30 VITALS — BP 132/90 | HR 79 | Temp 98.4°F | Ht 65.0 in | Wt 186.0 lb

## 2023-07-30 DIAGNOSIS — M797 Fibromyalgia: Secondary | ICD-10-CM | POA: Diagnosis not present

## 2023-07-30 DIAGNOSIS — F339 Major depressive disorder, recurrent, unspecified: Secondary | ICD-10-CM

## 2023-07-30 DIAGNOSIS — F5104 Psychophysiologic insomnia: Secondary | ICD-10-CM | POA: Diagnosis not present

## 2023-07-30 MED ORDER — KETOROLAC TROMETHAMINE 30 MG/ML IJ SOLN
30.0000 mg | Freq: Once | INTRAMUSCULAR | Status: AC
Start: 2023-07-30 — End: 2023-07-30
  Administered 2023-07-30: 30 mg via INTRAMUSCULAR

## 2023-07-30 MED ORDER — TRIAMCINOLONE ACETONIDE 40 MG/ML IJ SUSP
40.0000 mg | Freq: Once | INTRAMUSCULAR | Status: AC
Start: 2023-07-30 — End: 2023-07-30
  Administered 2023-07-30: 40 mg via INTRAMUSCULAR

## 2023-07-30 MED ORDER — MELOXICAM 15 MG PO TABS
15.0000 mg | ORAL_TABLET | Freq: Every day | ORAL | 1 refills | Status: DC
Start: 2023-07-30 — End: 2023-10-17

## 2023-07-30 NOTE — Progress Notes (Signed)
I,Jameka J Llittleton, CMA,acting as a Neurosurgeon for [employer], NP.,have documented all relevant documentation on the behalf of Ellender Hose, NP,as directed by  Ellender Hose, NP while in the presence of Ellender Hose, NP.  Subjective:  Patient ID: Lisa Casey , female    DOB: 06-28-1961 , 62 y.o.   MRN: [id number]  Chief Complaint  Patient presents with   Fibromyalgia    HPI  Patient presents today for a fibromyalgia flare up. She reports the flare up started over the weekend with increased aches and pains. She would like to have Toradol injection  to help with the pain. Patient is currently on Pregabalin 150mg  BID, which she states helps but she needs something in between.  Patient also would also like to discuss Trazodone because she thinks it made her have a major headache after use.     Past Medical History:  Diagnosis Date   Anxiety    HX OF ANXIETY ATTACKS   Back pain, chronic    Bronchitis    Cholelithiasis 04/15/2021   Diabetes (HCC)    started Metformin in 02/2021   Fibromyalgia    GERD (gastroesophageal reflux disease)    H/O total hysterectomy 07/05/2022   Cervix removed per patient   History of kidney stones    Hypertension    Migraines    HX OF MIGRAINES   Myocardial infarction [facility])    july 2022   Obesity    Pneumonia    06-2021   Shortened PR interval      Family History  Problem Relation Age of Onset   Breast cancer Mother 32   Breast cancer Paternal Grandmother    Heart failure Neg Hx    Coronary artery disease Neg Hx      Current Outpatient Medications:    acetaminophen (TYLENOL) 500 MG tablet, Take 1,000 mg by mouth every 6 (six) hours as needed for moderate pain or headache., Disp: , Rfl:    albuterol (VENTOLIN HFA) 108 (90 Base) MCG/ACT inhaler, Inhale 2 puffs into the lungs every 6 (six) hours as needed for wheezing or shortness of breath., Disp: , Rfl:    amLODipine (NORVASC) 5 MG tablet, Take 1 tablet by mouth once daily, Disp: 90 tablet,  Rfl: 0   Azelastine HCl 0.15 % SOLN, Place 2 sprays into the nose as needed., Disp: 3 mL, Rfl: 1   Blood Glucose Monitoring Suppl (ONETOUCH VERIO) w/Device KIT, Use as directed to check blood sugars 2 times per day dx: e11.65, Disp: 1 kit, Rfl: 1   glucose blood (ONETOUCH VERIO) test strip, Use as directed to check blood sugars 2 times per day dx: e11.65, Disp: 100 each, Rfl: 5   levocetirizine (XYZAL) 5 MG tablet, TAKE 1 TABLET BY MOUTH ONCE DAILY IN THE EVENING, Disp: 90 tablet, Rfl: 0   meloxicam (MOBIC) 15 MG tablet, Take 1 tablet (15 mg total) by mouth daily., Disp: 30 tablet, Rfl: 1   Multiple Vitamin (MULTIVITAMIN WITH MINERALS) TABS tablet, Take 1 tablet by mouth daily., Disp: , Rfl:    nitroGLYCERIN (NITROSTAT) 0.4 MG SL tablet, Place 1 tablet (0.4 mg total) under the tongue every 5 (five) minutes as needed for chest pain., Disp: 25 tablet, Rfl: 4   OneTouch Delica Lancets 33G MISC, Use as directed to check blood sugars 2 times per day dx: e11.65, Disp: 100 each, Rfl: 5   pregabalin (LYRICA) 150 MG capsule, Take 1 capsule (150 mg total) by mouth 2 (two) times daily., Disp:  180 capsule, Rfl: 0   venlafaxine (EFFEXOR) 25 MG tablet, Take 1 tablet (25 mg total) by mouth 2 (two) times daily., Disp: 60 tablet, Rfl: 2   aspirin EC 81 MG EC tablet, Take 1 tablet (81 mg total) by mouth daily. Swallow whole. (Patient not taking: Reported on 07/30/2023), Disp: 30 tablet, Rfl: 11   atorvastatin (LIPITOR) 10 MG tablet, Take 1 tab by mouth MWF (Patient not taking: Reported on 07/30/2023), Disp: 45 tablet, Rfl: 1   hydrOXYzine (ATARAX) 25 MG tablet, Take 1 tablet (25 mg total) by mouth every 6 (six) hours., Disp: 12 tablet, Rfl: 0   metFORMIN (GLUCOPHAGE-XR) 750 MG 24 hr tablet, Take 1 tablet by mouth once daily with breakfast (Patient not taking: Reported on 07/30/2023), Disp: 30 tablet, Rfl: 0   omeprazole (PRILOSEC) 40 MG capsule, Take 40 mg by mouth daily. (Patient not taking: Reported on 07/30/2023),  Disp: , Rfl:    traZODone (DESYREL) 50 MG tablet, Take 1 tablet (50 mg total) by mouth at bedtime as needed for sleep. (Patient not taking: Reported on 07/30/2023), Disp: 30 tablet, Rfl: 2   Allergies  Allergen Reactions   Codeine Itching     Review of Systems  Constitutional: Negative.   HENT: Negative.    Eyes: Negative.   Respiratory: Negative.    Cardiovascular: Negative.   Musculoskeletal:  Positive for arthralgias and myalgias.  Skin: Negative.   Neurological: Negative.   Psychiatric/Behavioral:  Positive for dysphoric mood.      Today's Vitals   07/30/23 1004  BP: (!) 132/90  Pulse: 79  Temp: 98.4 F (36.9 C)  Weight: 186 lb (84.4 kg)  Height: 5\' 5"  (1.651 m)  PainSc: 9    Body mass index is 30.95 kg/m.  Wt Readings from Last 3 Encounters:  08/01/23 186 lb 1.1 oz (84.4 kg)  07/30/23 186 lb (84.4 kg)  07/24/23 185 lb (83.9 kg)    The ASCVD Risk score (Arnett DK, et al., 2019) failed to calculate for the following reasons:   The patient has a prior MI or stroke diagnosis  Objective:  Physical Exam HENT:     Head: Normocephalic.  Cardiovascular:     Rate and Rhythm: Normal rate and regular rhythm.  Pulmonary:     Effort: Pulmonary effort is normal.  Skin:    General: Skin is warm.  Neurological:     Mental Status: She is alert.         Assessment And Plan:  Fibromyalgia -     Ketorolac Tromethamine -     Triamcinolone Acetonide -     Meloxicam; Take 1 tablet (15 mg total) by mouth daily.  Dispense: 30 tablet; Refill: 1  Psychophysiological insomnia Assessment & Plan: Advised to try half tablet of Trazodone 50mg  PRN   Major depression, recurrent, chronic (HCC) Assessment & Plan: States she is getting mild symptom relief with Effexor 25mg  every BID. Offered dose increase but declined.      Return if symptoms worsen or fail to improve.  Patient was given opportunity to ask questions. Patient verbalized understanding of the plan and was able  to repeat key elements of the plan. All questions were answered to their satisfaction.    I, Ellender Hose, NP, have reviewed all documentation for this visit. The documentation on 08/02/2023 for the exam, diagnosis, procedures, and orders are all accurate and complete.    IF YOU HAVE BEEN REFERRED TO A SPECIALIST, IT MAY TAKE 1-2 WEEKS TO SCHEDULE/PROCESS THE REFERRAL.  IF YOU HAVE NOT HEARD FROM US/SPECIALIST IN TWO WEEKS, PLEASE GIVE [place] A CALL AT [phone].

## 2023-08-01 ENCOUNTER — Encounter (HOSPITAL_BASED_OUTPATIENT_CLINIC_OR_DEPARTMENT_OTHER): Payer: Self-pay | Admitting: Urology

## 2023-08-01 ENCOUNTER — Emergency Department (HOSPITAL_BASED_OUTPATIENT_CLINIC_OR_DEPARTMENT_OTHER): Payer: BC Managed Care – PPO

## 2023-08-01 ENCOUNTER — Emergency Department (HOSPITAL_BASED_OUTPATIENT_CLINIC_OR_DEPARTMENT_OTHER)
Admission: EM | Admit: 2023-08-01 | Discharge: 2023-08-01 | Disposition: A | Discharge status: Home / Self Care | Payer: BC Managed Care – PPO | Attending: Emergency Medicine | Admitting: Emergency Medicine

## 2023-08-01 ENCOUNTER — Other Ambulatory Visit: Payer: Self-pay

## 2023-08-01 DIAGNOSIS — Z7982 Long term (current) use of aspirin: Secondary | ICD-10-CM | POA: Insufficient documentation

## 2023-08-01 DIAGNOSIS — Z79899 Other long term (current) drug therapy: Secondary | ICD-10-CM | POA: Diagnosis not present

## 2023-08-01 DIAGNOSIS — R0789 Other chest pain: Secondary | ICD-10-CM | POA: Diagnosis present

## 2023-08-01 DIAGNOSIS — E119 Type 2 diabetes mellitus without complications: Secondary | ICD-10-CM | POA: Insufficient documentation

## 2023-08-01 DIAGNOSIS — Z7984 Long term (current) use of oral hypoglycemic drugs: Secondary | ICD-10-CM | POA: Insufficient documentation

## 2023-08-01 DIAGNOSIS — I10 Essential (primary) hypertension: Secondary | ICD-10-CM | POA: Diagnosis not present

## 2023-08-01 LAB — BASIC METABOLIC PANEL
Anion gap: 8 (ref 5–15)
BUN: 12 mg/dL (ref 8–23)
CO2: 26 mmol/L (ref 22–32)
Calcium: 9.5 mg/dL (ref 8.9–10.3)
Chloride: 104 mmol/L (ref 98–111)
Creatinine, Ser: 0.75 mg/dL (ref 0.44–1.00)
GFR, Estimated: >60 mL/min (ref >60)
Glucose, Bld: 106 mg/dL — ABNORMAL HIGH (ref 70–99)
Potassium: 4.3 mmol/L (ref 3.5–5.1)
Sodium: 138 mmol/L (ref 135–145)

## 2023-08-01 LAB — CBC
HCT: 43.9 % (ref 36.0–46.0)
Hemoglobin: 14.8 g/dL (ref 12.0–15.0)
MCH: 30.6 pg (ref 26.0–34.0)
MCHC: 33.7 g/dL (ref 30.0–36.0)
MCV: 90.7 fL (ref 80.0–100.0)
Platelets: 251 10*3/uL (ref 150–400)
RBC: 4.84 MIL/uL (ref 3.87–5.11)
RDW: 11.7 % (ref 11.5–15.5)
WBC: 4.8 10*3/uL (ref 4.0–10.5)
nRBC: 0 % (ref 0.0–0.2)

## 2023-08-01 LAB — LIPASE, BLOOD: Lipase: 34 U/L (ref 11–51)

## 2023-08-01 LAB — TROPONIN I (HIGH SENSITIVITY)
Troponin I (High Sensitivity): 2 ng/L (ref <18)
Troponin I (High Sensitivity): 2 ng/L (ref <18)

## 2023-08-01 MED ORDER — PROCHLORPERAZINE EDISYLATE 10 MG/2ML IJ SOLN
10.0000 mg | Freq: Once | INTRAMUSCULAR | Status: AC
  Administered 2023-08-01: 10 mg via INTRAVENOUS
  Filled 2023-08-01: qty 2

## 2023-08-01 MED ORDER — HYDROXYZINE HCL 25 MG PO TABS: 25.0000 mg | ORAL_TABLET | Freq: Four times a day (QID) | ORAL | 0 refills | Status: DC

## 2023-08-01 MED ORDER — SODIUM CHLORIDE 0.9 % IV BOLUS
500.0000 mL | Freq: Once | INTRAVENOUS | Status: AC
  Administered 2023-08-01: 500 mL via INTRAVENOUS

## 2023-08-01 MED ORDER — DIPHENHYDRAMINE HCL 50 MG/ML IJ SOLN
12.5000 mg | Freq: Once | INTRAMUSCULAR | Status: AC
  Administered 2023-08-01: 12.5 mg via INTRAVENOUS
  Filled 2023-08-01: qty 1

## 2023-08-01 MED ORDER — HYDROXYZINE HCL 25 MG PO TABS
25.0000 mg | ORAL_TABLET | Freq: Once | ORAL | Status: AC
  Administered 2023-08-01: 25 mg via ORAL
  Filled 2023-08-01: qty 1

## 2023-08-01 MED ORDER — ASPIRIN 325 MG PO TBEC
325.0000 mg | DELAYED_RELEASE_TABLET | Freq: Once | ORAL | Status: AC
Start: 2023-08-01 — End: 2023-08-01
  Administered 2023-08-01: 325 mg via ORAL
  Filled 2023-08-01: qty 1

## 2023-08-01 MED ORDER — NITROGLYCERIN 2 % TD OINT
1.0000 [in_us] | TOPICAL_OINTMENT | Freq: Once | TRANSDERMAL | Status: AC
  Administered 2023-08-01: 1 [in_us] via TOPICAL
  Filled 2023-08-01: qty 1

## 2023-08-01 NOTE — ED Triage Notes (Signed)
Pt states chest pain that started yesterday  States associated nausea and SOB with exertion   H/o MI in 2021

## 2023-08-01 NOTE — Discharge Instructions (Addendum)
Was reassuring.  No concerning cause of your chest pain.  We discussed your anxiety history.  You would like to try Atarax.  I have sent that into the pharmacy for you.  Follow-up with your primary care doctor.  Return to the emergency room for any concerning symptoms.

## 2023-08-01 NOTE — ED Provider Notes (Addendum)
[facility] Provider Note   CSN: [id number] Arrival date & time: 08/01/23  1007     History  Chief Complaint  Patient presents with   Chest Pain    Lisa Casey is a 62 y.o. female.  62 year old female presents today for concern of chest pain that is left-sided that started last night.  Has been fairly constant since then with brief episodes of waiting.  Lisa Casey is unsure of alleviating or aggravating factors.  Does have history of CAD and NSTEMI back in 2022.  Lisa Casey states it feels similar however the severity in 2022 was much worse.  Endorses associated shortness of breath, nausea that started this morning.  Lisa Casey also endorses a headache but states this has been ongoing for a few weeks.  Feels like a "medication headache".  Lisa Casey states Lisa Casey was doing her normal evening routine last night when the pain started.  The history is provided by the patient. No language interpreter was used.       Home Medications Prior to Admission medications   Medication Sig Start Date End Date Taking? Authorizing Provider  acetaminophen (TYLENOL) 500 MG tablet Take 1,000 mg by mouth every 6 (six) hours as needed for moderate pain or headache.    [provider]  albuterol (VENTOLIN HFA) 108 (90 Base) MCG/ACT inhaler Inhale 2 puffs into the lungs every 6 (six) hours as needed for wheezing or shortness of breath.    [provider]  amLODipine (NORVASC) 5 MG tablet Take 1 tablet by mouth once daily 05/21/23   [name], FNP  aspirin EC 81 MG EC tablet Take 1 tablet (81 mg total) by mouth daily. Swallow whole. Patient not taking: Reported on 07/30/2023 04/18/21   Kathlen Mody, MD  atorvastatin (LIPITOR) 10 MG tablet Take 1 tab by mouth MWF Patient not taking: Reported on 07/30/2023 06/27/22   [name], FNP  Azelastine HCl 0.15 % SOLN Place 2 sprays into the nose as needed. 07/24/23   [name], FNP  Blood Glucose Monitoring Suppl  (ONETOUCH VERIO) w/Device KIT Use as directed to check blood sugars 2 times per day dx: e11.65 04/20/21   Charlesetta Ivory, NP  glucose blood (ONETOUCH VERIO) test strip Use as directed to check blood sugars 2 times per day dx: e11.65 04/20/21   Charlesetta Ivory, NP  levocetirizine (XYZAL) 5 MG tablet TAKE 1 TABLET BY MOUTH ONCE DAILY IN THE EVENING 05/21/23   [name], FNP  meloxicam (MOBIC) 15 MG tablet Take 1 tablet (15 mg total) by mouth daily. 07/30/23   Ellender Hose, NP  metFORMIN (GLUCOPHAGE-XR) 750 MG 24 hr tablet Take 1 tablet by mouth once daily with breakfast Patient not taking: Reported on 07/30/2023 02/06/23   [name], FNP  Multiple Vitamin (MULTIVITAMIN WITH MINERALS) TABS tablet Take 1 tablet by mouth daily.    [provider]  nitroGLYCERIN (NITROSTAT) 0.4 MG SL tablet Place 1 tablet (0.4 mg total) under the tongue every 5 (five) minutes as needed for chest pain. 03/23/22   Meriam Sprague, MD  omeprazole (PRILOSEC) 40 MG capsule Take 40 mg by mouth daily. Patient not taking: Reported on 07/30/2023 09/02/22   [provider]  OneTouch Delica Lancets 33G MISC Use as directed to check blood sugars 2 times per day dx: e11.65 04/20/21   Charlesetta Ivory, NP  pregabalin (LYRICA) 150 MG capsule Take 1 capsule (150 mg total) by mouth 2 (two) times daily. 05/28/23   Christell Constant,  Lolita Cram, FNP  traZODone (DESYREL) 50 MG tablet Take 1 tablet (50 mg total) by mouth at bedtime as needed for sleep. Patient not taking: Reported on 07/30/2023 07/24/23   [name], FNP  venlafaxine (EFFEXOR) 25 MG tablet Take 1 tablet (25 mg total) by mouth 2 (two) times daily. 07/24/23   [name], FNP      Allergies    Codeine    Review of Systems   Review of Systems  Constitutional:  Negative for fever.  Eyes:  Negative for visual disturbance.  Respiratory:  Positive for shortness of breath.   Cardiovascular:  Positive for chest pain. Negative for palpitations and leg swelling.   Gastrointestinal:  Negative for abdominal pain.  Neurological:  Positive for headaches.    Physical Exam Updated Vital Signs BP (!) 147/85 (BP Location: Right Arm)   Pulse 76   Temp 97.7 F (36.5 C) (Oral)   Resp 18   Ht 5\' 5"  (1.651 m)   Wt 84.4 kg   LMP 08/04/2011   SpO2 99%   BMI 30.96 kg/m  Physical Exam Vitals and nursing note reviewed.  Constitutional:      General: Lisa Casey is not in acute distress.    Appearance: Normal appearance. Lisa Casey is not ill-appearing.  HENT:     Head: Normocephalic and atraumatic.     Nose: Nose normal.  Eyes:     General: No scleral icterus.    Extraocular Movements: Extraocular movements intact.     Conjunctiva/sclera: Conjunctivae normal.  Cardiovascular:     Rate and Rhythm: Normal rate and regular rhythm.     Pulses: Normal pulses.     Heart sounds: Normal heart sounds.  Pulmonary:     Effort: Pulmonary effort is normal. No respiratory distress.     Breath sounds: Normal breath sounds. No wheezing or rales.  Abdominal:     General: There is no distension.     Tenderness: There is no abdominal tenderness.  Musculoskeletal:        General: Normal range of motion.     Cervical back: Normal range of motion.  Skin:    General: Skin is warm and dry.  Neurological:     General: No focal deficit present.     Mental Status: Lisa Casey is alert. Mental status is at baseline.     ED Results / Procedures / Treatments   Labs (all labs ordered are listed, but only abnormal results are displayed) Labs Reviewed  CBC  BASIC METABOLIC PANEL  LIPASE, BLOOD  TROPONIN I (HIGH SENSITIVITY)    EKG EKG Interpretation Date/Time:  Thursday August 01 2023 10:16:18 EST Ventricular Rate:  76 PR Interval:  105 QRS Duration:  80 QT Interval:  484 QTC Calculation: 545 R Axis:   49  Text Interpretation: Sinus rhythm Short PR interval Borderline repolarization abnormality Prolonged QT interval No significant change since last tracing Confirmed by  Alvira Monday ([id number]) on 08/01/2023 10:20:23 AM  Radiology No results found.  Procedures Procedures    Medications Ordered in ED Medications  aspirin EC tablet 325 mg (has no administration in time range)  prochlorperazine (COMPAZINE) injection 10 mg (has no administration in time range)  diphenhydrAMINE (BENADRYL) injection 12.5 mg (has no administration in time range)  sodium chloride 0.9 % bolus 500 mL (has no administration in time range)  nitroGLYCERIN (NITROGLYN) 2 % ointment 1 inch (has no administration in time range)    ED Course/ Medical Decision Making/ A&P  Medical Decision Making Amount and/or Complexity of Data Reviewed Labs: ordered. Radiology: ordered.  Risk OTC drugs. Prescription drug management.   Medical Decision Making / ED Course   This patient presents to the ED for concern of chest pain, this involves an extensive number of treatment options, and is a complaint that carries with it a high risk of complications and morbidity.  The differential diagnosis includes ACS, PE, pneumonia, MSK etiology, lupus flareup  MDM: 62 year old female with past medical history significant for fibromyalgia, CAD, previous MI presents today for concern of chest pain.  Started last night and has persisted.  Endorses shortness of breath, nausea.  Pain does not radiate anywhere.  Also endorses headache.  Will give aspirin, migraine cocktail, and Nitropaste.  Will obtain ACS workup.  Will reevaluate.  Patient with history of anxiety as well. Reports currently Lisa Casey is anxious. Will give dose of Atarax. Discussed reassuring workup. Lisa Casey will follow-up with her PCP and cardiologist. Lisa Casey would like to be discharged. I did offer CT imaging of the head given her headache as husband reported that Lisa Casey has history of small aneurysm. However patient defers at this time and states Lisa Casey will return if he gets worse. States that headache did improve after the  medicines.   Workup overall reassuring.  Troponin negative x 2.  Chest x-ray without acute cardiopulmonary process.  EKG without acute ischemic change.  CBC without leukocytosis or anemia.  BMP without renal insufficiency or electrolyte derangement.  Lipase within normal.  Lisa Casey will follow-up with her PCP and cardiologist.  Lab Tests: -I ordered, reviewed, and interpreted labs.   The pertinent results include:   Labs Reviewed  BASIC METABOLIC PANEL - Abnormal; Notable for the following components:      Result Value   Glucose, Bld 106 (*)    All other components within normal limits  CBC  LIPASE, BLOOD  TROPONIN I (HIGH SENSITIVITY)  TROPONIN I (HIGH SENSITIVITY)      EKG  EKG Interpretation Date/Time:  Thursday August 01 2023 10:16:18 EST Ventricular Rate:  76 PR Interval:  105 QRS Duration:  80 QT Interval:  484 QTC Calculation: 545 R Axis:   49  Text Interpretation: Sinus rhythm Short PR interval Borderline repolarization abnormality Prolonged QT interval No significant change since last tracing Confirmed by Alvira Monday ([id number]) on 08/01/2023 10:20:23 AM         Imaging Studies ordered: I ordered imaging studies including cxr I independently visualized and interpreted imaging. I agree with the radiologist interpretation   Medicines ordered and prescription drug management: Meds ordered this encounter  Medications   aspirin EC tablet 325 mg   prochlorperazine (COMPAZINE) injection 10 mg   diphenhydrAMINE (BENADRYL) injection 12.5 mg   sodium chloride 0.9 % bolus 500 mL   nitroGLYCERIN (NITROGLYN) 2 % ointment 1 inch    -I have reviewed the patients home medicines and have made adjustments as needed  Reevaluation: After the interventions noted above, I reevaluated the patient and found that they have :improved  Co morbidities that complicate the patient evaluation  Past Medical History:  Diagnosis Date   Anxiety    HX OF ANXIETY ATTACKS   Back  pain, chronic    Bronchitis    Cholelithiasis 04/15/2021   Diabetes (HCC)    started Metformin in 02/2021   Fibromyalgia    GERD (gastroesophageal reflux disease)    H/O total hysterectomy 07/05/2022   Cervix removed per patient   History of kidney stones  Hypertension    Migraines    HX OF MIGRAINES   Myocardial infarction [facility])    july 2022   Obesity    Pneumonia    06-2021   Shortened PR interval       Dispostion: Charged in stable condition.  Return precaution discussed.  Patient voices understanding and is in agreement with plan.  Final Clinical Impression(s) / ED Diagnoses Final diagnoses:  Atypical chest pain    Rx / DC Orders ED Discharge Orders          Ordered    hydrOXYzine (ATARAX) 25 MG tablet  Every 6 hours        08/01/23 1339              Marita Kansas, PA-C 08/01/23 1735    Marita Kansas, PA-C 08/01/23 1736    Alvira Monday, MD 08/01/23 2223

## 2023-08-04 ENCOUNTER — Encounter: Payer: Self-pay | Admitting: Nurse Practitioner

## 2023-08-05 ENCOUNTER — Other Ambulatory Visit: Payer: Self-pay

## 2023-08-05 ENCOUNTER — Telehealth: Payer: Self-pay

## 2023-08-05 ENCOUNTER — Other Ambulatory Visit: Payer: Self-pay | Admitting: Nurse Practitioner

## 2023-08-05 DIAGNOSIS — F419 Anxiety disorder, unspecified: Secondary | ICD-10-CM

## 2023-08-05 MED ORDER — HYDROXYZINE HCL 25 MG PO TABS: 25.0000 mg | ORAL_TABLET | Freq: Four times a day (QID) | ORAL | 2 refills | Status: DC | PRN

## 2023-08-05 MED ORDER — VENLAFAXINE HCL 25 MG PO TABS: 25.0000 mg | ORAL_TABLET | Freq: Two times a day (BID) | ORAL | 1 refills | Status: DC

## 2023-08-05 NOTE — Assessment & Plan Note (Signed)
States she is getting mild symptom relief with Effexor 25mg  every BID. Offered dose increase but declined.

## 2023-08-05 NOTE — Transitions of Care (Post Inpatient/ED Visit) (Signed)
   08/05/2023  Name: Lisa Casey MRN: [id number] DOB: 21-Jul-1961  Today's TOC FU Call Status: Today's TOC FU Call Status:: Successful TOC FU Call Completed TOC FU Call Complete Date: 08/05/23 Patient's Name and Date of Birth confirmed.  Transition Care Management Follow-up Telephone Call Date of Discharge: 08/01/23 Discharge Facility: [facility] Type of Discharge: Inpatient Admission Primary Inpatient Discharge Diagnosis:: Atypical chest pain How have you been since you were released from the hospital?: Better Any questions or concerns?: No  Items Reviewed: Did you receive and understand the discharge instructions provided?: Yes Medications obtained,verified, and reconciled?: Yes (Medications Reviewed) Any new allergies since your discharge?: No Dietary orders reviewed?: No Do you have support at home?: Yes People in Home: spouse  Medications Reviewed Today: Medications Reviewed Today   Medications were not reviewed in this encounter     Home Care and Equipment/Supplies: Were Home Health Services Ordered?: No Any new equipment or medical supplies ordered?: No  Functional Questionnaire: Do you need assistance with bathing/showering or dressing?: No Do you need assistance with meal preparation?: No Do you need assistance with eating?: No Do you have difficulty maintaining continence: No Do you need assistance with getting out of bed/getting out of a chair/moving?: No Do you have difficulty managing or taking your medications?: No  Follow up appointments reviewed: PCP Follow-up appointment confirmed?: No (patient reports she will try to schedule with her cardiologist first) MD Provider Line Number:[phone] Given: Yes Specialist Hospital Follow-up appointment confirmed?: Yes Date of Specialist follow-up appointment?: 09/18/23 Follow-Up Specialty Provider:: cardiologist Do you need transportation to your follow-up appointment?: No Do you understand care  options if your condition(s) worsen?: Yes-patient verbalized understanding    SIGNATURE Lisabeth Devoid, CMA

## 2023-08-05 NOTE — Assessment & Plan Note (Signed)
Advised to try half tablet of Trazodone 50mg  PRN

## 2023-08-06 NOTE — Assessment & Plan Note (Signed)
She is having difficulty with sleeping due to her mood, will treat with trazodone

## 2023-08-06 NOTE — Assessment & Plan Note (Signed)
Blood pressure slightly elevated repeat is improved.  Continue current medications.

## 2023-08-06 NOTE — Assessment & Plan Note (Signed)
Wax is removed by with lavage with elephant ear with 1/2 water and 1/2 peroxide. Instructions for home care to prevent wax buildup are given. r

## 2023-08-06 NOTE — Assessment & Plan Note (Signed)
Will check her blood levels.

## 2023-08-06 NOTE — Assessment & Plan Note (Signed)
Pt instructed on Self Breast Exam.According to ACOG guidelines Women aged 63 and older are recommended to get an annual mammogram.referral placed

## 2023-08-06 NOTE — Assessment & Plan Note (Signed)
Started her on Effexor.  She is very tearful today during the visit.  Depression screen score is a 17.

## 2023-08-06 NOTE — Assessment & Plan Note (Signed)
She declines ever having a hemoglobin A1c greater than 6.4.  Will recheck hemoglobin A1c today.

## 2023-08-06 NOTE — Assessment & Plan Note (Signed)

## 2023-08-06 NOTE — Assessment & Plan Note (Signed)
She continues to have anxiety and is interested in going to counseling I have already made a referral previously she is to contact them.  I will also start her on Effexor discussed side effects.

## 2023-08-06 NOTE — Assessment & Plan Note (Signed)
Continue statin, tolerating well 

## 2023-08-06 NOTE — Assessment & Plan Note (Signed)
Given in office

## 2023-08-06 NOTE — Assessment & Plan Note (Signed)
She was seen in the ER continues to have some discomfort.  Advised this may take a little bit of time to heal.

## 2023-08-11 ENCOUNTER — Encounter: Payer: Self-pay | Admitting: Nurse Practitioner

## 2023-08-11 ENCOUNTER — Other Ambulatory Visit: Payer: Self-pay | Admitting: Nurse Practitioner

## 2023-08-12 ENCOUNTER — Other Ambulatory Visit: Payer: Self-pay | Admitting: Nurse Practitioner

## 2023-08-12 MED ORDER — PREGABALIN 150 MG PO CAPS: 150.0000 mg | ORAL_CAPSULE | Freq: Two times a day (BID) | ORAL | 1 refills | Status: DC

## 2023-08-19 ENCOUNTER — Encounter: Payer: Self-pay | Admitting: Nurse Practitioner

## 2023-08-19 ENCOUNTER — Other Ambulatory Visit: Payer: Self-pay

## 2023-08-19 ENCOUNTER — Other Ambulatory Visit: Payer: Self-pay | Admitting: Nurse Practitioner

## 2023-08-19 DIAGNOSIS — M797 Fibromyalgia: Secondary | ICD-10-CM

## 2023-08-19 MED ORDER — PREGABALIN 200 MG PO CAPS: 200.0000 mg | ORAL_CAPSULE | Freq: Two times a day (BID) | ORAL | 0 refills | Status: DC

## 2023-08-19 NOTE — Progress Notes (Signed)
Patient sent message reporting she continues to have pain. Will increase dose of lyrica to 200 mg BID and sent a referral to pain management for possible nerve stimulator. She also verbalizes not being able to go to work due to the pain. I will defer this request to Ellender Hose NP since she seen her last.

## 2023-08-22 ENCOUNTER — Other Ambulatory Visit: Payer: Self-pay | Admitting: Nurse Practitioner

## 2023-08-22 DIAGNOSIS — R0982 Postnasal drip: Secondary | ICD-10-CM

## 2023-08-23 ENCOUNTER — Ambulatory Visit
Admission: RE | Admit: 2023-08-23 | Discharge: 2023-08-23 | Disposition: A | Discharge status: Home / Self Care | Payer: BC Managed Care – PPO | Source: Ambulatory Visit | Attending: Nurse Practitioner | Admitting: Nurse Practitioner

## 2023-08-23 ENCOUNTER — Other Ambulatory Visit: Payer: Self-pay | Admitting: Nurse Practitioner

## 2023-08-23 DIAGNOSIS — Z1231 Encounter for screening mammogram for malignant neoplasm of breast: Secondary | ICD-10-CM

## 2023-08-27 ENCOUNTER — Encounter: Payer: Self-pay | Admitting: Nurse Practitioner

## 2023-08-28 ENCOUNTER — Other Ambulatory Visit: Payer: Self-pay | Admitting: Nurse Practitioner

## 2023-08-28 MED ORDER — ESZOPICLONE 1 MG PO TABS: 1.0000 mg | ORAL_TABLET | Freq: Every evening | ORAL | 2 refills | Status: DC | PRN

## 2023-08-28 NOTE — Telephone Encounter (Signed)
Please schedule for a 6 week medication f/u (insomnia)

## 2023-09-30 ENCOUNTER — Other Ambulatory Visit: Payer: Self-pay | Admitting: Nurse Practitioner

## 2023-09-30 ENCOUNTER — Other Ambulatory Visit: Payer: Self-pay

## 2023-09-30 ENCOUNTER — Other Ambulatory Visit: Payer: Self-pay | Admitting: Family Medicine

## 2023-09-30 DIAGNOSIS — M797 Fibromyalgia: Secondary | ICD-10-CM

## 2023-10-04 ENCOUNTER — Telehealth: Payer: Self-pay

## 2023-10-04 ENCOUNTER — Other Ambulatory Visit: Payer: Self-pay | Admitting: Nurse Practitioner

## 2023-10-04 NOTE — Telephone Encounter (Signed)
PA for Lunesta 1mg  sent to plan.

## 2023-10-07 ENCOUNTER — Other Ambulatory Visit: Payer: Self-pay | Admitting: Nurse Practitioner

## 2023-10-07 MED ORDER — ESZOPICLONE 1 MG PO TABS: 1.0000 mg | ORAL_TABLET | Freq: Every evening | ORAL | 3 refills | Status: DC | PRN

## 2023-10-07 NOTE — Progress Notes (Signed)
Received message from insurance company indicating max amount of lunesta per month is 15 tabs, new rx sent to pharmacy. CMA to contact patient

## 2023-10-17 ENCOUNTER — Telehealth: Payer: 59 | Admitting: Nurse Practitioner

## 2023-10-17 ENCOUNTER — Encounter: Payer: Self-pay | Admitting: Nurse Practitioner

## 2023-10-17 DIAGNOSIS — R7303 Prediabetes: Secondary | ICD-10-CM | POA: Diagnosis not present

## 2023-10-17 DIAGNOSIS — F419 Anxiety disorder, unspecified: Secondary | ICD-10-CM

## 2023-10-17 DIAGNOSIS — F5104 Psychophysiologic insomnia: Secondary | ICD-10-CM

## 2023-10-17 DIAGNOSIS — M797 Fibromyalgia: Secondary | ICD-10-CM | POA: Diagnosis not present

## 2023-10-17 MED ORDER — VENLAFAXINE HCL 25 MG PO TABS: 25.0000 mg | ORAL_TABLET | Freq: Two times a day (BID) | ORAL | 1 refills | Status: DC

## 2023-10-17 MED ORDER — PREGABALIN 25 MG PO CAPS: 25.0000 mg | ORAL_CAPSULE | Freq: Three times a day (TID) | ORAL | Status: DC

## 2023-10-17 MED ORDER — METFORMIN HCL ER 750 MG PO TB24: 750.0000 mg | ORAL_TABLET | Freq: Every day | ORAL | 1 refills | Status: DC

## 2023-10-17 MED ORDER — MELOXICAM 15 MG PO TABS: 15.0000 mg | ORAL_TABLET | Freq: Every day | ORAL | 1 refills | Status: DC

## 2023-10-17 NOTE — Progress Notes (Signed)
Virtual Visit via Video Note  Lisa Casey, CMA,acting as a scribe for Lisa Felts, FNP.,have documented all relevant documentation on the behalf of Lisa Felts, FNP,as directed by  Lisa Felts, FNP while in the presence of Lisa Felts, FNP.  I connected with Lisa Casey on 10/17/23 at 12:00 PM EST by a video enabled telemedicine application and verified that I am speaking with the correct person using two identifiers.  Patient Location: Home Provider Location: Office/Clinic  I discussed the limitations, risks, security, and privacy concerns of performing an evaluation and management service by video and the availability of in person appointments. I also discussed with the patient that there may be a patient responsible charge related to this service. The patient expressed understanding and agreed to proceed.  Subjective: PCP: Lisa Felts, FNP  Chief Complaint  Patient presents with   Insomnia   Patient presents today for a insomnia follow up, Patient reports compliance with medication. Patient denies any chest pain, SOB, or headaches. She has picked it up this past weekend. She took it Sunday night around 8p has to be up by 5pm. She thinks it did help. She did sleep on the nights she did not take the medication. The insurance only gave her 15 tabs. Patient would like a everyday medication to help her sleep.  She is also now on 225 mg of Lyrica which started in the last week. Patient also reports she thinks she is taking to many medications she would like to discuss her medications today as well.     ROS: Per HPI  Current Outpatient Medications:    acetaminophen (TYLENOL) 500 MG tablet, Take 1,000 mg by mouth every 6 (six) hours as needed for moderate pain or headache., Disp: , Rfl:    albuterol (VENTOLIN HFA) 108 (90 Base) MCG/ACT inhaler, Inhale 2 puffs into the lungs every 6 (six) hours as needed for wheezing or shortness of breath., Disp: , Rfl:    amLODipine  (NORVASC) 5 MG tablet, Take 1 tablet by mouth once daily, Disp: 90 tablet, Rfl: 0   Azelastine HCl 0.15 % SOLN, Place 2 sprays into the nose as needed., Disp: 3 mL, Rfl: 1   Blood Glucose Monitoring Suppl (ONETOUCH VERIO) w/Device KIT, Use as directed to check blood sugars 2 times per day dx: e11.65, Disp: 1 kit, Rfl: 1   eszopiclone (LUNESTA) 1 MG TABS tablet, Take 1 tablet (1 mg total) by mouth at bedtime as needed for sleep. Take immediately before bedtime, Disp: 15 tablet, Rfl: 3   glucose blood (ONETOUCH VERIO) test strip, Use as directed to check blood sugars 2 times per day dx: e11.65, Disp: 100 each, Rfl: 5   hydrOXYzine (ATARAX) 25 MG tablet, Take 1 tablet (25 mg total) by mouth every 6 (six) hours as needed., Disp: 30 tablet, Rfl: 2   levocetirizine (XYZAL) 5 MG tablet, TAKE 1 TABLET BY MOUTH ONCE DAILY IN THE EVENING, Disp: 90 tablet, Rfl: 0   Multiple Vitamin (MULTIVITAMIN WITH MINERALS) TABS tablet, Take 1 tablet by mouth daily., Disp: , Rfl:    nitroGLYCERIN (NITROSTAT) 0.4 MG SL tablet, Place 1 tablet (0.4 mg total) under the tongue every 5 (five) minutes as needed for chest pain., Disp: 25 tablet, Rfl: 4   OneTouch Delica Lancets 33G MISC, Use as directed to check blood sugars 2 times per day dx: e11.65, Disp: 100 each, Rfl: 5   pregabalin (LYRICA) 25 MG capsule, Take 1 capsule (25 mg total) by mouth 3 (  three) times daily., Disp: , Rfl:    aspirin EC 81 MG EC tablet, Take 1 tablet (81 mg total) by mouth daily. Swallow whole. (Patient not taking: Reported on 07/30/2023), Disp: 30 tablet, Rfl: 11   atorvastatin (LIPITOR) 10 MG tablet, Take 1 tab by mouth MWF (Patient not taking: Reported on 10/17/2023), Disp: 45 tablet, Rfl: 1   meloxicam (MOBIC) 15 MG tablet, Take 1 tablet (15 mg total) by mouth daily., Disp: 30 tablet, Rfl: 1   metFORMIN (GLUCOPHAGE-XR) 750 MG 24 hr tablet, Take 1 tablet (750 mg total) by mouth daily with breakfast., Disp: 90 tablet, Rfl: 1   omeprazole (PRILOSEC) 40 MG  capsule, Take 40 mg by mouth daily. (Patient not taking: Reported on 10/17/2023), Disp: , Rfl:    pregabalin (LYRICA) 200 MG capsule, Take 1 capsule by mouth twice daily (Patient not taking: Reported on 10/17/2023), Disp: 180 capsule, Rfl: 0   venlafaxine (EFFEXOR) 25 MG tablet, Take 1 tablet (25 mg total) by mouth 2 (two) times daily., Disp: 180 tablet, Rfl: 1  Observations/Objective: There were no vitals filed for this visit. Physical Exam Vitals reviewed.  Constitutional:      Appearance: Normal appearance. She is obese.  Pulmonary:     Effort: Pulmonary effort is normal. No respiratory distress.  Skin:    Capillary Refill: Capillary refill takes less than 2 seconds.  Neurological:     General: No focal deficit present.     Mental Status: She is alert and oriented to person, place, and time.     Cranial Nerves: No cranial nerve deficit.     Motor: No weakness.      Assessment and Plan: Psychophysiological insomnia Assessment & Plan: I did tell her to take her lyrica a little earlier and she had a recent increase in the dose as this may be affecting her when taking her lunesta and making her more groggy.    Anxiety Assessment & Plan: She is doing well with venlafaxine. Continue to use hydroxyzine as needed.   Orders: -     Venlafaxine HCl; Take 1 tablet (25 mg total) by mouth 2 (two) times daily.  Dispense: 180 tablet; Refill: 1  Prediabetes Assessment & Plan: HgbA1c is less than 6.4, she is advised to continue taking her metformin sent another refill.   Orders: -     metFORMIN HCl ER; Take 1 tablet (750 mg total) by mouth daily with breakfast.  Dispense: 90 tablet; Refill: 1  Fibromyalgia Assessment & Plan: She is now being followed at [facility] management, advised they need to refill her lyrica and to discuss with them about the meloxicam, I did refill that today.   Orders: -     Meloxicam; Take 1 tablet (15 mg total) by mouth daily.  Dispense: 30 tablet; Refill:  1  Other orders -     Pregabalin; Take 1 capsule (25 mg total) by mouth 3 (three) times daily.    Follow Up Instructions: Return for KEEP SAME NEXT.   I discussed the assessment and treatment plan with the patient. The patient was provided an opportunity to ask questions, and all were answered. The patient agreed with the plan and demonstrated an understanding of the instructions.   The patient was advised to call back or seek an in-person evaluation if the symptoms worsen or if the condition fails to improve as anticipated.  The above assessment and management plan was discussed with the patient. The patient verbalized understanding of and has agreed to  the management plan.   Jeanell Sparrow, FNP, have reviewed all documentation for this visit. The documentation on 10/17/23 for the exam, diagnosis, procedures, and orders are all accurate and complete.

## 2023-10-17 NOTE — Assessment & Plan Note (Signed)
She is doing well with venlafaxine. Continue to use hydroxyzine as needed.

## 2023-10-17 NOTE — Assessment & Plan Note (Signed)
She is now being followed at [facility] management, advised they need to refill her lyrica and to discuss with them about the meloxicam, I did refill that today.

## 2023-10-17 NOTE — Assessment & Plan Note (Signed)
I did tell her to take her lyrica a little earlier and she had a recent increase in the dose as this may be affecting her when taking her lunesta and making her more groggy.

## 2023-10-17 NOTE — Assessment & Plan Note (Signed)
HgbA1c is less than 6.4, she is advised to continue taking her metformin sent another refill.

## 2023-10-21 ENCOUNTER — Other Ambulatory Visit: Payer: Self-pay | Admitting: Nurse Practitioner

## 2023-10-21 DIAGNOSIS — F5104 Psychophysiologic insomnia: Secondary | ICD-10-CM

## 2023-10-22 ENCOUNTER — Other Ambulatory Visit: Payer: Self-pay | Admitting: Nurse Practitioner

## 2023-10-22 DIAGNOSIS — F5104 Psychophysiologic insomnia: Secondary | ICD-10-CM

## 2023-11-20 ENCOUNTER — Ambulatory Visit: Payer: BC Managed Care – PPO | Attending: Cardiology | Admitting: Cardiology

## 2023-11-20 ENCOUNTER — Encounter: Payer: Self-pay | Admitting: Cardiology

## 2023-11-20 VITALS — BP 130/86 | HR 74 | Ht 65.0 in | Wt 197.2 lb

## 2023-11-20 DIAGNOSIS — I10 Essential (primary) hypertension: Secondary | ICD-10-CM | POA: Diagnosis not present

## 2023-11-20 DIAGNOSIS — I214 Non-ST elevation (NSTEMI) myocardial infarction: Secondary | ICD-10-CM | POA: Diagnosis not present

## 2023-11-20 MED ORDER — AMLODIPINE BESYLATE 10 MG PO TABS: 10.0000 mg | ORAL_TABLET | Freq: Every day | ORAL | 3 refills | Status: AC

## 2023-11-20 NOTE — Progress Notes (Signed)
 Cardiology Office Note:    Date:  11/20/2023   ID:  CRESCENT GOTHAM, DOB 29-May-1961, MRN [id number]  PCP:  [name], FNP   [facility] Providers Cardiologist:  Parke Poisson, MD     Referring MD: [name], FNP   History of Present Illness:    Lisa Casey is a 63 y.o. female with a hx of NSTEMI on 03/2021, obesity, anxiety, pre-DM, fibromyalgia, GERD, HTN, historically short PR interval, former brief tobacco use who presents to clinic for follow-up.  She presented to the emergency department on 04/15/2021 with central chest pain and abdominal pain that woke her from sleep.  Trop 2>130. She underwent chest CT which showed normal aorta, abnormal kidney suggesting nephro calcinosis, and some calcified coronary artery atherosclerosis.  She underwent cardiac catheterization on 04/17/2021 which showed normal coronary anatomy and no plaque.  MRI showed a small area of inferior lateral apical infarct.  It was felt to be likely to be related to small distal vessel occlusion secondary to diabetes and was recommended for 1 year of DAPT.  She is here today for followup and is doing well.  She had and episode of CP a year ago after seeing Dr. Shari Prows that resolved with SL NTG.  It was surrounding a stressful time.  She denies any chest pain or pressure, SOB, DOE, PND, orthopnea, LE edema, dizziness, palpitations (except when she gets stressed)  or syncope. She is compliant with her meds and is tolerating meds with no SE.    Past Medical History:  Diagnosis Date   Anxiety    HX OF ANXIETY ATTACKS   Back pain, chronic    Bronchitis    Cholelithiasis 04/15/2021   Diabetes (HCC)    started Metformin in 02/2021   Fibromyalgia    GERD (gastroesophageal reflux disease)    H/O total hysterectomy 07/05/2022   Cervix removed per patient   History of kidney stones    Hypertension    Migraines    HX OF MIGRAINES   Myocardial infarction [facility])    july 2022   Obesity    Pneumonia     06-2021   Shortened PR interval     Past Surgical History:  Procedure Laterality Date   CONSTIPATION     CYSTOSCOPY W/ URETERAL STENT PLACEMENT  09/23/2012   Procedure: CYSTOSCOPY WITH RETROGRADE PYELOGRAM/URETERAL STENT PLACEMENT;  Surgeon: Sebastian Ache, MD;  Location: [facility];  Service: Urology;  Laterality: Right;   CYSTOSCOPY WITH RETROGRADE PYELOGRAM, URETEROSCOPY AND STENT PLACEMENT  10/08/2012   Procedure: CYSTOSCOPY WITH RETROGRADE PYELOGRAM, URETEROSCOPY AND STENT PLACEMENT;  Surgeon: Sebastian Ache, MD;  Location: [facility];  Service: Urology;  Laterality: Right;  90 MIN NO RETROGRADE NEEDS FLEX URETEROSCOPE    CYSTOSCOPY/URETEROSCOPY/HOLMIUM LASER/STENT PLACEMENT Right 08/21/2021   Procedure: CYSTOSCOPY RIGHT /URETEROSCOPY/HOLMIUM LASER/STENT PLACEMENT;  Surgeon: Crista Elliot, MD;  Location: [facility];  Service: Urology;  Laterality: Right;   HOLMIUM LASER APPLICATION  10/08/2012   Procedure: HOLMIUM LASER APPLICATION;  Surgeon: Sebastian Ache, MD;  Location: [facility];  Service: Urology;  Laterality: Right;   LEFT HEART CATH AND CORONARY ANGIOGRAPHY N/A 04/17/2021   Procedure: LEFT HEART CATH AND CORONARY ANGIOGRAPHY;  Surgeon: Kathleene Hazel, MD;  Location: [facility];  Service: Cardiovascular;  Laterality: N/A;   LEFT HEART CATHETERIZATION WITH CORONARY ANGIOGRAM N/A 10/29/2011   Procedure: LEFT HEART CATHETERIZATION WITH CORONARY ANGIOGRAM;  Surgeon: Marykay Lex, MD;  Location: [facility] CATH LAB;  Service: Cardiovascular;  Laterality: N/A;   ROBOTIC ASSISTED LAP VAGINAL HYSTERECTOMY  09/14/2011   Procedure: ROBOTIC ASSISTED LAPAROSCOPIC VAGINAL HYSTERECTOMY;  Surgeon: Roseanna Rainbow, MD;  Location: [facility];  Service: Gynecology;  Laterality: N/A;   TUBAL LIGATION      Current Medications: Current Meds  Medication Sig   acetaminophen (TYLENOL) 500 MG tablet Take 1,000 mg by mouth every 6 (six) hours as needed for moderate  pain or headache.   albuterol (VENTOLIN HFA) 108 (90 Base) MCG/ACT inhaler Inhale 2 puffs into the lungs every 6 (six) hours as needed for wheezing or shortness of breath.   amLODipine (NORVASC) 5 MG tablet Take 1 tablet by mouth once daily   Azelastine HCl 0.15 % SOLN Place 2 sprays into the nose as needed.   Blood Glucose Monitoring Suppl (ONETOUCH VERIO) w/Device KIT Use as directed to check blood sugars 2 times per day dx: e11.65   cetirizine (ZYRTEC) 10 MG tablet Take by mouth.   eszopiclone (LUNESTA) 1 MG TABS tablet Take 1 tablet (1 mg total) by mouth at bedtime as needed for sleep. Take immediately before bedtime   fluticasone (FLONASE) 50 MCG/ACT nasal spray by Nasal route.   glucose blood (ONETOUCH VERIO) test strip Use as directed to check blood sugars 2 times per day dx: e11.65   hydrOXYzine (ATARAX) 25 MG tablet Take 1 tablet (25 mg total) by mouth every 6 (six) hours as needed.   levocetirizine (XYZAL) 5 MG tablet TAKE 1 TABLET BY MOUTH ONCE DAILY IN THE EVENING   meloxicam (MOBIC) 15 MG tablet Take 1 tablet (15 mg total) by mouth daily.   metFORMIN (GLUCOPHAGE-XR) 750 MG 24 hr tablet Take 1 tablet (750 mg total) by mouth daily with breakfast.   Multiple Vitamin (MULTIVITAMIN WITH MINERALS) TABS tablet Take 1 tablet by mouth daily.   nitroGLYCERIN (NITROSTAT) 0.4 MG SL tablet Place 1 tablet (0.4 mg total) under the tongue every 5 (five) minutes as needed for chest pain.   omeprazole (PRILOSEC) 40 MG capsule Take 40 mg by mouth daily.   OneTouch Delica Lancets 33G MISC Use as directed to check blood sugars 2 times per day dx: e11.65   pregabalin (LYRICA) 200 MG capsule Take 1 capsule by mouth twice daily   pregabalin (LYRICA) 25 MG capsule Take 1 capsule (25 mg total) by mouth 3 (three) times daily.   tiZANidine (ZANAFLEX) 4 MG tablet Take by mouth.   venlafaxine (EFFEXOR) 25 MG tablet Take 1 tablet (25 mg total) by mouth 2 (two) times daily.     Allergies:   Codeine   Social  History   Socioeconomic History   Marital status: Married    Spouse name: Not on file   Number of children: Not on file   Years of education: Not on file   Highest education level: Not on file  Occupational History   Occupation: school bus driver  Tobacco Use   Smoking status: Former    Current packs/day: 0.00    Average packs/day: 0.3 packs/day for 10.0 years (2.5 ttl pk-yrs)    Types: Cigarettes    Start date: 61    Quit date: 84    Years since quitting: 28.1   Smokeless tobacco: Never  Vaping Use   Vaping status: Never Used  Substance and Sexual Activity   Alcohol use: No   Drug use: No   Sexual activity: Yes    Birth control/protection: Other-see comments    Comment: hysterectomy  Other Topics Concern  Not on file  Social History Narrative   Not on file   Social Drivers of Health   Financial Resource Strain: Low Risk  (11/05/2023)   Received from [facility]   Overall Financial Resource Strain (CARDIA)    Difficulty of Paying Living Expenses: Not very hard  Food Insecurity: No Food Insecurity (11/05/2023)   Received from [facility]   Hunger Vital Sign    Worried About Running Out of Food in the Last Year: Never true    Ran Out of Food in the Last Year: Never true  Transportation Needs: No Transportation Needs (11/05/2023)   Received from [facility] - Transportation    Lack of Transportation (Medical): No    Lack of Transportation (Non-Medical): No  Physical Activity: Not on file  Stress: Not on file  Social Connections: Not on file     Family History: The patient's family history includes Breast cancer in her paternal grandmother; Breast cancer (age of onset: 54) in her mother. There is no history of Heart failure or Coronary artery disease.  ROS:   Please see the history of present illness.     All other systems reviewed and are negative.  EKGs/Labs/Other Studies Reviewed:    The following studies were reviewed today: Cardiac  catheterization 04/17/2021 No angiographic evidence of CAD LVEDP 15-17 mmHg   Recommendations: No further ischemic testing. Consider coronary vasospasm as the etiology of her chest pain and elevated troponin.    Cardiac MRI 04/18/2021 FINDINGS: LEFT VENTRICLE:   Normal left ventricular chamber size.   Normal left ventricular wall thickness.   Normal left ventricular systolic function.   LVEF = 69%   There are no regional wall motion abnormalities.   Focal myocardial edema, T2 = 61 msec in the inferolateral apex. Otherwise normal T2 values in myocardium.   Normal first pass perfusion.   There is post contrast delayed myocardial enhancement. There is subendocardial LGE in the lateral apex, appears less than 50% of myocardial thickness. This finding and localization is best visualized in series 38 (Hi Res PSIR). This finding most consistent with small area of infarct with preserved viability.   Normal T1 myocardial nulling kinetics suggest against a diagnosis of cardiac amyloidosis.   ECV = 25%, normal range   RIGHT VENTRICLE:   Normal right ventricular chamber size.   Normal right ventricular wall thickness.   Normal right ventricular systolic function.   RVEF = 58%   There are no regional wall motion abnormalities.   No post contrast delayed myocardial enhancement.   ATRIA:   Normal left atrial size.   Normal right atrial size.   VALVES:   No significant valvular abnormalities.   Flow quantitation of aortic valve shows no significant aortic valve regurgitation, likely trivial.   PERICARDIUM:   Normal pericardium.  No pericardial effusion.   OTHER: No significant extracardiac findings.   MEASUREMENTS: Left ventricle:   LV Female   LV EF: 69% (normal 56-78%)   Absolute volumes:   LV EDV: (Normal 52-141 mL)   LV ESV: 37mL (Normal 13-51 mL)   LV SV: 84mL (Normal 33-97 mL)   CO: 4.8L/min (Normal 2.7-6.0 L/min)   Indexed volumes:   LV  EDV: 62mL/sq-m (Normal 41-81 mL/sq-m)   LV ESV: 11mL/sq-m (Normal 12-21 mL/sq-m)   LV SV: 27mL/sq-m (Normal 26-56 mL/sq-m)   CI: 2.33L/min/sq-m (normal 1.8-3.8 L/min/sq-m)   Right ventricle:   RV female   RV EF: 58% (Normal 47-80%)   Absolute  volumes:   RV EDV: 127 mL (Normal 58-154 mL)   RV ESV: 53 mL (Normal 12-68 mL)   RV SV: 74 mL (Normal 35-98 mL)   CO: 4.3 L/min (Normal 2.7-6 L/min)   Indexed volumes:   RV EDV: 61 ML/sq-m (Normal 48-87 mL/sq-m)   RV ESV: 25 mL/sq-m (Normal 11-28 mL/sq-m)   RV SV: 36 mL/sq-m (Normal 27-57 mL/sq-m)   CI: 2.07 L/min/sq-m (Normal 1.8-3.8 L/min/sq-m)   IMPRESSION: 1. Normal biventricular chamber size and function. LVEF 69%, RVEF 58%.   2. Study performed to evaluate source of elevated troponin and chest pain with grossly normal epicardial coronary arteries. There is small area of myocardial edema and subendocardial delayed enhancement in the inferolateral LV apex, consistent with infarction. Findings less suggestive of myocarditis.     EKG:  No new tracing  Recent Labs: 07/24/2023: ALT 8 08/01/2023: BUN 12; Creatinine, Ser 0.75; Hemoglobin 14.8; Platelets 251; Potassium 4.3; Sodium 138  Recent Lipid Panel    Component Value Date/Time   CHOL 164 07/24/2023 1206   TRIG 100 07/24/2023 1206   HDL 77 07/24/2023 1206   CHOLHDL 2.1 07/24/2023 1206   CHOLHDL 2.4 04/16/2021 0051   VLDL 16 04/16/2021 0051   LDLCALC 69 07/24/2023 1206     Risk Assessment/Calculations:           Physical Exam:    VS:  BP 130/86   Pulse 74   Ht 5\' 5"  (1.651 m)   Wt 197 lb 3.2 oz (89.4 kg)   LMP 08/04/2011   SpO2 93%   BMI 32.82 kg/m     Wt Readings from Last 3 Encounters:  11/20/23 197 lb 3.2 oz (89.4 kg)  08/01/23 186 lb 1.1 oz (84.4 kg)  07/30/23 186 lb (84.4 kg)    GEN: Well nourished, well developed in no acute distress HEENT: Normal NECK: No JVD; No carotid bruits LYMPHATICS: No lymphadenopathy CARDIAC:RRR, no murmurs,  rubs, gallops RESPIRATORY:  Clear to auscultation without rales, wheezing or rhonchi  ABDOMEN: Soft, non-tender, non-distended MUSCULOSKELETAL:  No edema; No deformity  SKIN: Warm and dry NEUROLOGIC:  Alert and oriented x 3 PSYCHIATRIC:  Normal affect  ASSESSMENT:    1. Non-ST elevation (NSTEMI) myocardial infarction (HCC)   2. Primary hypertension     PLAN:    In order of problems listed above:  #NSTEMI: Patient with history of NSTEMI in 03/2021 with no epicardial disease on cath. CMR with small area of inferolateral infarct thought to be small vessel disease in the setting of DMII. Recommended for 1 year of DAPT. Currently, having episodes of chest pain when suffering with anxiety. May also have element of vasospasm. Will do trial of nitro as needed for chest pain (helped symptoms in the past) and if improves, can add imdur at that time.  - Cath with no epicardial coronary disease - Mri with small area of infarct in inferolateral region of apex.  - denies any anginal CP since seen last - continue prescription drug management with aspirin 81 mg daily, amlodipine, atorvastatin 10 mg daily with as needed refills  #HTN: -BP is borderline controlled on exam today but has been elevated some at home in the 145/75-49mmHg -increase Amlodipine to 10mg  daily -she will let me know if she gets LE edema on the higher dose of Amlodipine -I have asked her to check her BP twice daily for a week and call with results  Followup with me in 1 year     Medication Adjustments/Labs and Tests  Ordered: Current medicines are reviewed at length with the patient today.  Concerns regarding medicines are outlined above.  No orders of the defined types were placed in this encounter.  No orders of the defined types were placed in this encounter.   There are no Patient Instructions on file for this visit.   Signed, Armanda Magic, MD  11/20/2023 11:08 AM    [facility]

## 2023-11-20 NOTE — Patient Instructions (Signed)
 Medication Instructions:  Your physician has recommended you make the following change in your medication:  INCREASE: amlodipine (Norvasc) to 10 mg by mouth once daily Please check your BP twice daily for a week and call in readings to Dr. Mayford Knife  *If you need a refill on your cardiac medications before your next appointment, please call your pharmacy*   Lab Work: NONE  If you have labs (blood work) drawn today and your tests are completely normal, you will receive your results only by: MyChart Message (if you have MyChart) OR A paper copy in the mail If you have any lab test that is abnormal or we need to change your treatment, we will call you to review the results.   Testing/Procedures: NONE   Follow-Up: At [facility], you and your health needs are our priority.  As part of our continuing mission to provide you with exceptional heart care, we have created designated Provider Care Teams.  These Care Teams include your primary Cardiologist (physician) and Advanced Practice Providers (APPs -  Physician Assistants and Nurse Practitioners) who all work together to provide you with the care you need, when you need it.   Your next appointment:   1 year(s)  Provider:   Armanda Magic, MD   Other Instructions   1st Floor: - Lobby - Registration  - Pharmacy  - Lab - Cafe  2nd Floor: - PV Lab - Diagnostic Testing (echo, CT, nuclear med)  3rd Floor: - Vacant  4th Floor: - TCTS (cardiothoracic surgery) - AFib Clinic - Structural Heart Clinic - Vascular Surgery  - Vascular Ultrasound  5th Floor: - HeartCare Cardiology (general and EP) - Clinical Pharmacy for coumadin, hypertension, lipid, weight-loss medications, and med management appointments    Valet parking services will be available as well.

## 2023-11-20 NOTE — Addendum Note (Signed)
 Addended by: Macie Burows on: 11/20/2023 11:20 AM   Modules accepted: Orders

## 2023-11-21 ENCOUNTER — Other Ambulatory Visit: Payer: Self-pay | Admitting: Nurse Practitioner

## 2023-11-22 ENCOUNTER — Other Ambulatory Visit: Payer: Self-pay | Admitting: Nurse Practitioner

## 2023-11-22 MED ORDER — PREGABALIN 25 MG PO CAPS: 25.0000 mg | ORAL_CAPSULE | Freq: Three times a day (TID) | ORAL | 2 refills | Status: DC

## 2023-11-24 ENCOUNTER — Other Ambulatory Visit: Payer: Self-pay | Admitting: Nurse Practitioner

## 2023-11-24 DIAGNOSIS — R0982 Postnasal drip: Secondary | ICD-10-CM

## 2023-11-25 ENCOUNTER — Other Ambulatory Visit: Payer: Self-pay | Admitting: Nurse Practitioner

## 2023-11-25 DIAGNOSIS — M797 Fibromyalgia: Secondary | ICD-10-CM

## 2023-11-25 MED ORDER — PREGABALIN 200 MG PO CAPS: 200.0000 mg | ORAL_CAPSULE | Freq: Two times a day (BID) | ORAL | 0 refills | Status: DC

## 2023-12-05 ENCOUNTER — Other Ambulatory Visit: Payer: Self-pay | Admitting: Nurse Practitioner

## 2023-12-21 ENCOUNTER — Other Ambulatory Visit: Payer: Self-pay | Admitting: Nurse Practitioner

## 2023-12-21 DIAGNOSIS — M797 Fibromyalgia: Secondary | ICD-10-CM

## 2024-01-08 ENCOUNTER — Encounter (HOSPITAL_BASED_OUTPATIENT_CLINIC_OR_DEPARTMENT_OTHER): Payer: Self-pay | Admitting: Cardiology

## 2024-01-21 ENCOUNTER — Ambulatory Visit: Payer: BC Managed Care – PPO | Admitting: Nurse Practitioner

## 2024-01-27 ENCOUNTER — Ambulatory Visit: Admitting: Nurse Practitioner

## 2024-01-27 ENCOUNTER — Encounter: Payer: Self-pay | Admitting: Nurse Practitioner

## 2024-01-27 VITALS — BP 140/88 | HR 75 | Temp 98.3°F | Ht 65.0 in | Wt 203.8 lb

## 2024-01-27 DIAGNOSIS — E669 Obesity, unspecified: Secondary | ICD-10-CM | POA: Diagnosis not present

## 2024-01-27 DIAGNOSIS — K5904 Chronic idiopathic constipation: Secondary | ICD-10-CM | POA: Diagnosis not present

## 2024-01-27 DIAGNOSIS — I10 Essential (primary) hypertension: Secondary | ICD-10-CM

## 2024-01-27 DIAGNOSIS — Z2821 Immunization not carried out because of patient refusal: Secondary | ICD-10-CM | POA: Insufficient documentation

## 2024-01-27 DIAGNOSIS — R7303 Prediabetes: Secondary | ICD-10-CM | POA: Diagnosis not present

## 2024-01-27 DIAGNOSIS — I252 Old myocardial infarction: Secondary | ICD-10-CM

## 2024-01-27 DIAGNOSIS — W19XXXA Unspecified fall, initial encounter: Secondary | ICD-10-CM

## 2024-01-27 MED ORDER — WEGOVY 0.25 MG/0.5ML ~~LOC~~ SOAJ: 0.2500 mg | SUBCUTANEOUS | 0 refills | Status: DC

## 2024-01-27 MED ORDER — LINACLOTIDE 72 MCG PO CAPS: 72.0000 ug | ORAL_CAPSULE | Freq: Every day | ORAL | 1 refills | Status: DC

## 2024-01-27 NOTE — Assessment & Plan Note (Signed)
 She had a fall 1 week ago but denies having any injuries.

## 2024-01-27 NOTE — Assessment & Plan Note (Signed)
 Declines shingrix, educated on disease process and is aware if he changes his mind to notify office

## 2024-01-27 NOTE — Assessment & Plan Note (Signed)
 HgbA1c is less than 6.4, she is advised to continue taking her metformin sent another refill.

## 2024-01-27 NOTE — Assessment & Plan Note (Signed)

## 2024-01-27 NOTE — Assessment & Plan Note (Signed)
 Blood pressure slightly elevated repeat is improved.  She is being followed by Dr. Micael Adas and had an increase in her amlodipine  at her last visit. Continue current medications.

## 2024-01-27 NOTE — Assessment & Plan Note (Signed)
 She is encouraged to strive for BMI less than 30 to decrease cardiac risk. Advised to aim for at least 150 minutes of exercise per week. Unfortunately she had a 6 lb weight gain since March and 16 lb weight gain since November 2024.

## 2024-01-27 NOTE — Assessment & Plan Note (Signed)
 Will try to get her approved for [facility] due to history of MI. Once she picks up the medication if approved will need to have an appt for teaching

## 2024-01-27 NOTE — Progress Notes (Signed)
 [place], CMA,acting as a Neurosurgeon for Susanna Epley, FNP.,have documented all relevant documentation on the behalf of Susanna Epley, FNP,as directed by  Susanna Epley, FNP while in the presence of Susanna Epley, FNP.  Subjective:  Patient ID: Lisa Casey , female    DOB: June 30, 1961 , 63 y.o.   MRN: [id number]  Chief Complaint  Patient presents with   Hypertension    Patient presents today for a bp and pre dm follow up, Patient reports compliance with medication. Patient denies any chest pain, SOB, or headaches. Patient has no concerns today.    Constipation    Patient reports she is not having able to have a bowel movement without taking a laxative or enema.    Obesity    Patient would also like to start contrave.    Fall    Patient reports she fell down the steps about 2 weeks ago she reports she fell pretty hard but didn't go get seen anywhere. She reports today she is having "general" pain that is a 3.     HPI  She was having accidents at night with Linzess so she stopped taking due to having multiple accidents. She is using an enema 3 times a week. She is taking a stool softner daily. She will also take miralax but it makes her "gassy" she has cut back on her vegetables also due to having gas.   She fell at work on ice. She missed the bottom 2 steps of her house. She feels fine. Was a little sore.   She has been denied on a device for her back through the pain doctor.  She did not get the Zynex device that I had tried to send in previously.    Follow-up for blood pressure, "blood pressure's been high lately", constipation with "not able to have a bowel movement without an enema", fall "down my stairs" 2 weeks ago  History of Present Illness The patient, with a history of heart attack in 2022, presents for follow-up of hypertension. She reports her blood pressure has been high lately, despite an increase in amlodipine  to 10 mg daily by her cardiologist about a month and a half  ago. The patient messaged her cardiologist through MyChart a couple of weeks ago to report continued high blood pressure, and was advised to continue the current medication regimen.  The patient notes that her blood pressure tends to be elevated when she is in significant pain, though she is not experiencing much pain today. She denies any changes to her diet and exercise regimen since her last visit.   Additionally, the patient reports severe constipation, requiring enema use at least 3 times per week. She states she is "miserable" and has started to notice "a little bit of blood" when using enemas. The patient takes daily stool softeners (Dulcolax) and its liquid form, as well as MiraLAX with coffee every morning, but finds the latter causes excessive gas. She reports hesitancy to eat certain foods due to her gastrointestinal issues. The patient previously tried Linzess but discontinued it due to fecal incontinence, particularly at night and while driving.  The patient also mentions two recent falls. She fell at work on ice in February and fell down her stairs last week. While she experienced soreness and leg pain for a couple of days following the most recent fall, she believes she did not break anything and states, "I think I'm fine." She wanted these falls noted in her record.  Lastly, the  patient reports being denied approval for an unspecified back device that was recommended by the Pain Center.      Past Medical History:  Diagnosis Date   Abdominal pain 05/26/2022   Anxiety    HX OF ANXIETY ATTACKS   Back pain, chronic    Bronchitis    Cholelithiasis 04/15/2021   Diabetes (HCC)    started Metformin  in 02/2021   Fibromyalgia    GERD (gastroesophageal reflux disease)    H/O total hysterectomy 07/05/2022   Cervix removed per patient   History of kidney stones    Hypertension    Migraines    HX OF MIGRAINES   Myocardial infarction [facility])    july 2022   Obesity    Pneumonia     06-2021   Shortened PR interval      Family History  Problem Relation Age of Onset   Breast cancer Mother 39   Breast cancer Paternal Grandmother    Heart failure Neg Hx    Coronary artery disease Neg Hx      Current Outpatient Medications:    acetaminophen  (TYLENOL ) 500 MG tablet, Take 1,000 mg by mouth every 6 (six) hours as needed for moderate pain or headache., Disp: , Rfl:    albuterol  (VENTOLIN  HFA) 108 (90 Base) MCG/ACT inhaler, Inhale 2 puffs into the lungs every 6 (six) hours as needed for wheezing or shortness of breath., Disp: , Rfl:    amLODipine  (NORVASC ) 10 MG tablet, Take 1 tablet (10 mg total) by mouth daily., Disp: 180 tablet, Rfl: 3   Azelastine  HCl 0.15 % SOLN, Place 2 sprays into the nose as needed., Disp: 3 mL, Rfl: 1   Blood Glucose Monitoring Suppl (ONETOUCH VERIO) w/Device KIT, Use as directed to check blood sugars 2 times per day dx: e11.65, Disp: 1 kit, Rfl: 1   cetirizine  (ZYRTEC ) 10 MG tablet, Take by mouth., Disp: , Rfl:    eszopiclone  (LUNESTA ) 1 MG TABS tablet, Take 1 tablet (1 mg total) by mouth at bedtime as needed for sleep. Take immediately before bedtime, Disp: 15 tablet, Rfl: 3   fluticasone  (FLONASE ) 50 MCG/ACT nasal spray, by Nasal route., Disp: , Rfl:    glucose blood (ONETOUCH VERIO) test strip, Use as directed to check blood sugars 2 times per day dx: e11.65, Disp: 100 each, Rfl: 5   hydrOXYzine  (ATARAX ) 25 MG tablet, TAKE 1 TABLET BY MOUTH EVERY 6 HOURS AS NEEDED, Disp: 30 tablet, Rfl: 0   levocetirizine (XYZAL ) 5 MG tablet, TAKE 1 TABLET BY MOUTH ONCE DAILY IN THE EVENING, Disp: 90 tablet, Rfl: 0   linaclotide (LINZESS) 72 MCG capsule, Take 1 capsule (72 mcg total) by mouth daily before breakfast., Disp: 90 capsule, Rfl: 1   meloxicam  (MOBIC ) 15 MG tablet, Take 1 tablet by mouth once daily, Disp: 30 tablet, Rfl: 0   metFORMIN  (GLUCOPHAGE -XR) 750 MG 24 hr tablet, Take 1 tablet (750 mg total) by mouth daily with breakfast., Disp: 90 tablet, Rfl:  1   Multiple Vitamin (MULTIVITAMIN WITH MINERALS) TABS tablet, Take 1 tablet by mouth daily., Disp: , Rfl:    nitroGLYCERIN  (NITROSTAT ) 0.4 MG SL tablet, Place 1 tablet (0.4 mg total) under the tongue every 5 (five) minutes as needed for chest pain., Disp: 25 tablet, Rfl: 4   omeprazole (PRILOSEC) 40 MG capsule, Take 40 mg by mouth daily., Disp: , Rfl:    OneTouch Delica Lancets 33G MISC, Use as directed to check blood sugars 2 times per day dx: e11.65,  Disp: 100 each, Rfl: 5   pregabalin  (LYRICA ) 200 MG capsule, Take 1 capsule (200 mg total) by mouth 2 (two) times daily., Disp: 180 capsule, Rfl: 0   pregabalin  (LYRICA ) 25 MG capsule, Take 1 capsule (25 mg total) by mouth 3 (three) times daily., Disp: 90 capsule, Rfl: 2   Semaglutide-Weight Management (WEGOVY) 0.25 MG/0.5ML SOAJ, Inject 0.25 mg into the skin once a week., Disp: 2 mL, Rfl: 0   tiZANidine (ZANAFLEX) 4 MG tablet, Take by mouth., Disp: , Rfl:    venlafaxine  (EFFEXOR ) 25 MG tablet, Take 1 tablet (25 mg total) by mouth 2 (two) times daily., Disp: 180 tablet, Rfl: 1   Allergies  Allergen Reactions   Codeine Itching     Review of Systems  Constitutional: Negative.   HENT:  Negative for congestion.   Respiratory: Negative.    Cardiovascular: Negative.   Gastrointestinal:  Positive for constipation.  Genitourinary: Negative.   Musculoskeletal:  Negative for back pain.  Neurological: Negative.   Psychiatric/Behavioral: Negative.    All other systems reviewed and are negative.    Today's Vitals   01/27/24 1015 01/27/24 1110  BP: (!) 130/90 (!) 140/88  Pulse: 75   Temp: 98.3 F (36.8 C)   TempSrc: Oral   Weight: 203 lb 12.8 oz (92.4 kg)   Height: 5\' 5"  (1.651 m)   PainSc: 3     Body mass index is 33.91 kg/m.  Wt Readings from Last 3 Encounters:  01/27/24 203 lb 12.8 oz (92.4 kg)  11/20/23 197 lb 3.2 oz (89.4 kg)  08/01/23 186 lb 1.1 oz (84.4 kg)     Objective:  Physical Exam Vitals and nursing note reviewed.   Constitutional:      General: She is not in acute distress.    Appearance: Normal appearance. She is obese.  Cardiovascular:     Rate and Rhythm: Normal rate and regular rhythm.     Pulses: Normal pulses.     Heart sounds: Normal heart sounds.  Pulmonary:     Effort: Pulmonary effort is normal. No respiratory distress.     Breath sounds: Normal breath sounds. No wheezing.  Skin:    General: Skin is warm and dry.     Capillary Refill: Capillary refill takes less than 2 seconds.  Neurological:     General: No focal deficit present.     Mental Status: She is alert and oriented to person, place, and time.     Cranial Nerves: No cranial nerve deficit.     Motor: No weakness.       Assessment And Plan:  Essential hypertension Assessment & Plan: Blood pressure slightly elevated repeat is improved.  She is being followed by Dr. Micael Adas and had an increase in her amlodipine  at her last visit. Continue current medications.  Orders: -     BMP8+eGFR  Prediabetes Assessment & Plan: HgbA1c is less than 6.4, she is advised to continue taking her metformin  sent another refill.   Orders: -     Hemoglobin A1c  Chronic idiopathic constipation Assessment & Plan: Will start her back on linzess and given samples of restora.   Orders: -     linaCLOtide; Take 1 capsule (72 mcg total) by mouth daily before breakfast.  Dispense: 90 capsule; Refill: 1  COVID-19 vaccination declined Assessment & Plan: Declines covid 19 vaccine. Discussed risk of covid 50 and if she changes her mind about the vaccine to call the office. Education has been provided regarding the importance of  this vaccine but patient still declined. Advised may receive this vaccine at local pharmacy or Health Dept.or vaccine clinic. Aware to provide a copy of the vaccination record if obtained from local pharmacy or Health Dept.  Encouraged to take multivitamin, vitamin d , vitamin c and zinc to increase immune system. Aware can call  office if would like to have vaccine here at office. Verbalized acceptance and understanding.    Herpes zoster vaccination declined Assessment & Plan: Declines shingrix , educated on disease process and is aware if he changes his mind to notify office    Obesity (BMI 30-39.9) Assessment & Plan: She is encouraged to strive for BMI less than 30 to decrease cardiac risk. Advised to aim for at least 150 minutes of exercise per week. Unfortunately she had a 6 lb weight gain since March and 16 lb weight gain since November 2024.    Fall, initial encounter Assessment & Plan: She had a fall 1 week ago but denies having any injuries.    History of MI (myocardial infarction) Assessment & Plan: Will try to get her approved for [facility] due to history of MI. Once she picks up the medication if approved will need to have an appt for teaching  Orders: -     NWGNFA; Inject 0.25 mg into the skin once a week.  Dispense: 2 mL; Refill: 0    Return for keep same next.  Patient was given opportunity to ask questions. Patient verbalized understanding of the plan and was able to repeat key elements of the plan. All questions were answered to their satisfaction.    Inge Mangle, FNP, have reviewed all documentation for this visit. The documentation on 01/27/24 for the exam, diagnosis, procedures, and orders are all accurate and complete.   IF YOU HAVE BEEN REFERRED TO A SPECIALIST, IT MAY TAKE 1-2 WEEKS TO SCHEDULE/PROCESS THE REFERRAL. IF YOU HAVE NOT HEARD FROM US /SPECIALIST IN TWO WEEKS, PLEASE GIVE US  A CALL AT [phone].

## 2024-01-27 NOTE — Assessment & Plan Note (Signed)
 Will start her back on linzess and given samples of restora.

## 2024-01-28 ENCOUNTER — Encounter: Payer: Self-pay | Admitting: Nurse Practitioner

## 2024-01-28 ENCOUNTER — Ambulatory Visit: Payer: Self-pay | Admitting: Nurse Practitioner

## 2024-01-28 LAB — BMP8+EGFR
BUN/Creatinine Ratio: 20 (ref 12–28)
BUN: 15 mg/dL (ref 8–27)
CO2: 22 mmol/L (ref 20–29)
Calcium: 9.6 mg/dL (ref 8.7–10.3)
Chloride: 103 mmol/L (ref 96–106)
Creatinine, Ser: 0.74 mg/dL (ref 0.57–1.00)
Glucose: 93 mg/dL (ref 70–99)
Potassium: 4.3 mmol/L (ref 3.5–5.2)
Sodium: 142 mmol/L (ref 134–144)
eGFR: 91 mL/min/{1.73_m2} (ref >59)

## 2024-01-28 LAB — HEMOGLOBIN A1C
Est. average glucose Bld gHb Est-mCnc: 128 mg/dL
Hgb A1c MFr Bld: 6.1 % — ABNORMAL HIGH (ref 4.8–5.6)

## 2024-01-29 ENCOUNTER — Telehealth: Payer: Self-pay

## 2024-01-29 NOTE — Telephone Encounter (Signed)
 PA for wegovy states- Your PA has been resolved, no additional PA is required.

## 2024-02-05 ENCOUNTER — Ambulatory Visit (HOSPITAL_BASED_OUTPATIENT_CLINIC_OR_DEPARTMENT_OTHER): Admitting: Physical Therapy

## 2024-02-07 ENCOUNTER — Encounter: Payer: Self-pay | Admitting: Nurse Practitioner

## 2024-02-07 ENCOUNTER — Encounter: Payer: Self-pay | Admitting: Cardiology

## 2024-02-11 ENCOUNTER — Other Ambulatory Visit: Payer: Self-pay | Admitting: Nurse Practitioner

## 2024-02-11 ENCOUNTER — Other Ambulatory Visit: Payer: Self-pay

## 2024-02-11 MED ORDER — HYDROXYZINE HCL 25 MG PO TABS: 25.0000 mg | ORAL_TABLET | Freq: Four times a day (QID) | ORAL | 0 refills | Status: DC | PRN

## 2024-02-13 ENCOUNTER — Telehealth: Admitting: Physician Assistant

## 2024-02-13 DIAGNOSIS — J9801 Acute bronchospasm: Secondary | ICD-10-CM

## 2024-02-13 DIAGNOSIS — J019 Acute sinusitis, unspecified: Secondary | ICD-10-CM | POA: Diagnosis not present

## 2024-02-13 MED ORDER — AMOXICILLIN-POT CLAVULANATE 875-125 MG PO TABS: 1.0000 | ORAL_TABLET | Freq: Two times a day (BID) | ORAL | 0 refills | Status: AC

## 2024-02-13 MED ORDER — METHYLPREDNISOLONE 4 MG PO TBPK: ORAL_TABLET | ORAL | 0 refills | Status: DC

## 2024-02-13 NOTE — Progress Notes (Signed)
 Virtual Visit Consent   Lisa Casey, you are scheduled for a virtual visit with a [facility] provider today. Just as with appointments in the office, your consent must be obtained to participate. Your consent will be active for this visit and any virtual visit you may have with one of our providers in the next 365 days. If you have a MyChart account, a copy of this consent can be sent to you electronically.  As this is a virtual visit, video technology does not allow for your provider to perform a traditional examination. This may limit your provider's ability to fully assess your condition. If your provider identifies any concerns that need to be evaluated in person or the need to arrange testing (such as labs, EKG, etc.), we will make arrangements to do so. Although advances in technology are sophisticated, we cannot ensure that it will always work on either your end or our end. If the connection with a video visit is poor, the visit may have to be switched to a telephone visit. With either a video or telephone visit, we are not always able to ensure that we have a secure connection.  By engaging in this virtual visit, you consent to the provision of healthcare and authorize for your insurance to be billed (if applicable) for the services provided during this visit. Depending on your insurance coverage, you may receive a charge related to this service.  I need to obtain your verbal consent now. Are you willing to proceed with your visit today? Armandina BETTE BRIENZA has provided verbal consent on 02/13/2024 for a virtual visit (video or telephone). Wanita Gutta, PA-C  Date: 02/13/2024 5:45 PM   Virtual Visit via Video Note   I, Wanita Gutta, connected with  CAREENA DEGRAFFENREID  ([id number], [birth year]) on 02/13/24 at  5:30 PM EDT by a video-enabled telemedicine application and verified that I am speaking with the correct person using two identifiers.  Location: Patient: Virtual Visit Location Patient:  Home Provider: Virtual Visit Location Provider: Home Office   I discussed the limitations of evaluation and management by telemedicine and the availability of in person appointments. The patient expressed understanding and agreed to proceed.    History of Present Illness: TIEN AISPURO is a 63 y.o. who identifies as a female who was assigned female at birth, and is being seen today for cough, head and chest congestion.  HPI: 63y/o F presents to video telehealth visit for c/o head congestion, sinus pressure and pain along with cough, chest congestion x 2 weeks. Cough makes her out of breath. Denies fever. Negative test for flu and covid. Has been using her inhaler and has used Tessalon  in the past without much relief.    URI     Problems:  Patient Active Problem List   Diagnosis Date Noted   COVID-19 vaccination declined 01/27/2024   Herpes zoster vaccination declined 01/27/2024   Fall 01/27/2024   History of MI (myocardial infarction) 01/27/2024   Contusion of rib on left side 07/24/2023   Bruising 07/24/2023   Need for zoster vaccination 07/24/2023   Encounter for screening mammogram for breast cancer 07/24/2023   Encounter for annual health examination 07/24/2023   Bilateral impacted cerumen 07/24/2023   Psychophysiological insomnia 07/24/2023   Wound of left leg 07/24/2023   Chronic right-sided low back pain without sciatica 11/06/2022   Feeling stressed out 11/06/2022   Constipation 11/05/2022   Family history of malignant neoplasm of digestive organs 11/05/2022   Rectal  bleeding 11/05/2022   Sleep apnea 11/05/2022   H/O total hysterectomy 07/05/2022   Choledocholithiasis 05/28/2022   Class 1 obesity due to excess calories without serious comorbidity with body mass index (BMI) of 31.0 to 31.9 in adult 05/13/2022   Dry eyes, bilateral 05/13/2022   Atherosclerosis 05/13/2022   Diabetes mellitus (HCC) 07/19/2021   Hyperlipidemia 07/19/2021   Adjustment disorder with  anxiety 05/25/2021   NSTEMI (non-ST elevated myocardial infarction) (HCC) 04/15/2021   Fibromyalgia 04/15/2021   Headache 04/15/2021   Cholelithiasis 04/15/2021   Anxiety 04/15/2021   Prediabetes 04/07/2021   Essential hypertension 08/09/2020   Abnormal glucose 08/09/2020   Obesity (BMI 30-39.9) 08/09/2020   Trigger finger, right middle finger 06/22/2020   Bilateral foot pain 03/31/2019   Palpitations 02/23/2019   Plantar fasciitis, bilateral 02/23/2019   Chronic pain syndrome 10/27/2018   Major depression, recurrent, chronic (HCC) 09/23/2018   Leiomyoma of uterus, unspecified 09/14/2011    Allergies:  Allergies  Allergen Reactions   Codeine Itching   Medications:  Current Outpatient Medications:    amoxicillin -clavulanate (AUGMENTIN ) 875-125 MG tablet, Take 1 tablet by mouth 2 (two) times daily for 5 days., Disp: 10 tablet, Rfl: 0   methylPREDNISolone  (MEDROL  DOSEPAK) 4 MG TBPK tablet, Take as directed, Disp: 1 each, Rfl: 0   acetaminophen  (TYLENOL ) 500 MG tablet, Take 1,000 mg by mouth every 6 (six) hours as needed for moderate pain or headache., Disp: , Rfl:    albuterol  (VENTOLIN  HFA) 108 (90 Base) MCG/ACT inhaler, Inhale 2 puffs into the lungs every 6 (six) hours as needed for wheezing or shortness of breath., Disp: , Rfl:    amLODipine  (NORVASC ) 10 MG tablet, Take 1 tablet (10 mg total) by mouth daily., Disp: 180 tablet, Rfl: 3   Azelastine  HCl 0.15 % SOLN, Place 2 sprays into the nose as needed., Disp: 3 mL, Rfl: 1   Blood Glucose Monitoring Suppl (ONETOUCH VERIO) w/Device KIT, Use as directed to check blood sugars 2 times per day dx: e11.65, Disp: 1 kit, Rfl: 1   cetirizine  (ZYRTEC ) 10 MG tablet, Take by mouth., Disp: , Rfl:    eszopiclone  (LUNESTA ) 1 MG TABS tablet, Take 1 tablet (1 mg total) by mouth at bedtime as needed for sleep. Take immediately before bedtime, Disp: 15 tablet, Rfl: 3   fluticasone  (FLONASE ) 50 MCG/ACT nasal spray, by Nasal route., Disp: , Rfl:     glucose blood (ONETOUCH VERIO) test strip, Use as directed to check blood sugars 2 times per day dx: e11.65, Disp: 100 each, Rfl: 5   hydrOXYzine  (ATARAX ) 25 MG tablet, TAKE 1 TABLET BY MOUTH EVERY 6 HOURS AS NEEDED, Disp: 30 tablet, Rfl: 0   levocetirizine (XYZAL ) 5 MG tablet, TAKE 1 TABLET BY MOUTH ONCE DAILY IN THE EVENING, Disp: 90 tablet, Rfl: 0   linaclotide  (LINZESS ) 72 MCG capsule, Take 1 capsule (72 mcg total) by mouth daily before breakfast., Disp: 90 capsule, Rfl: 1   meloxicam  (MOBIC ) 15 MG tablet, Take 1 tablet by mouth once daily, Disp: 30 tablet, Rfl: 0   metFORMIN  (GLUCOPHAGE -XR) 750 MG 24 hr tablet, Take 1 tablet (750 mg total) by mouth daily with breakfast., Disp: 90 tablet, Rfl: 1   Multiple Vitamin (MULTIVITAMIN WITH MINERALS) TABS tablet, Take 1 tablet by mouth daily., Disp: , Rfl:    nitroGLYCERIN  (NITROSTAT ) 0.4 MG SL tablet, Place 1 tablet (0.4 mg total) under the tongue every 5 (five) minutes as needed for chest pain., Disp: 25 tablet, Rfl: 4   omeprazole (  PRILOSEC) 40 MG capsule, Take 40 mg by mouth daily., Disp: , Rfl:    OneTouch Delica Lancets 33G MISC, Use as directed to check blood sugars 2 times per day dx: e11.65, Disp: 100 each, Rfl: 5   pregabalin  (LYRICA ) 200 MG capsule, Take 1 capsule (200 mg total) by mouth 2 (two) times daily., Disp: 180 capsule, Rfl: 0   pregabalin  (LYRICA ) 25 MG capsule, Take 1 capsule (25 mg total) by mouth 3 (three) times daily., Disp: 90 capsule, Rfl: 2   Semaglutide -Weight Management (WEGOVY ) 0.25 MG/0.5ML SOAJ, Inject 0.25 mg into the skin once a week., Disp: 2 mL, Rfl: 0   tiZANidine (ZANAFLEX) 4 MG tablet, Take by mouth., Disp: , Rfl:    venlafaxine  (EFFEXOR ) 25 MG tablet, Take 1 tablet (25 mg total) by mouth 2 (two) times daily., Disp: 180 tablet, Rfl: 1  Observations/Objective: Patient is well-developed, well-nourished in no acute distress.  Resting comfortably  at home.  Head is normocephalic, atraumatic.  No labored breathing.   Speech is clear and coherent with logical content.  Patient is alert and oriented at baseline.    Assessment and Plan: 1. Acute non-recurrent sinusitis, unspecified location (Primary) - amoxicillin -clavulanate (AUGMENTIN ) 875-125 MG tablet; Take 1 tablet by mouth 2 (two) times daily for 5 days.  Dispense: 10 tablet; Refill: 0  2. Bronchospasm - methylPREDNISolone (MEDROL DOSEPAK) 4 MG TBPK tablet; Take as directed  Dispense: 1 each; Refill: 0  Increase fluids Consider taking otc oral anti-histamine. Start medicines as prescribed Continue with albuterol  inhaler as previously prescribed. Schedule a virtual appointment or follow up at an urgent care clinic if symptoms don't improve.  Pt verbalized understanding and in agreement.    Follow Up Instructions: I discussed the assessment and treatment plan with the patient. The patient was provided an opportunity to ask questions and all were answered. The patient agreed with the plan and demonstrated an understanding of the instructions.  A copy of instructions were sent to the patient via MyChart unless otherwise noted below.   Patient has requested to receive PHI (AVS, Work Notes, etc) pertaining to this video visit through e-mail as they are currently without active MyChart. They have voiced understand that email is not considered secure and their health information could be viewed by someone other than the patient.   The patient was advised to call back or seek an in-person evaluation if the symptoms worsen or if the condition fails to improve as anticipated.    Tamrah Victorino, PA-C

## 2024-02-13 NOTE — Patient Instructions (Signed)
 Lisa Casey, thank you for joining Wanita Gutta, PA-C for today's virtual visit.  While this provider is not your primary care provider (PCP), if your PCP is located in our provider database this encounter information will be shared with them immediately following your visit.   A [facility] MyChart account gives you access to today's visit and all your visits, tests, and labs performed at [facility] " click here if you don't have a [facility] MyChart account or go to [email or web address]  Consent: (Patient) Lisa Casey provided verbal consent for this virtual visit at the beginning of the encounter.  Current Medications:  Current Outpatient Medications:    amoxicillin -clavulanate (AUGMENTIN ) 875-125 MG tablet, Take 1 tablet by mouth 2 (two) times daily for 5 days., Disp: 10 tablet, Rfl: 0   methylPREDNISolone (MEDROL DOSEPAK) 4 MG TBPK tablet, Take as directed, Disp: 1 each, Rfl: 0   acetaminophen  (TYLENOL ) 500 MG tablet, Take 1,000 mg by mouth every 6 (six) hours as needed for moderate pain or headache., Disp: , Rfl:    albuterol  (VENTOLIN  HFA) 108 (90 Base) MCG/ACT inhaler, Inhale 2 puffs into the lungs every 6 (six) hours as needed for wheezing or shortness of breath., Disp: , Rfl:    amLODipine  (NORVASC ) 10 MG tablet, Take 1 tablet (10 mg total) by mouth daily., Disp: 180 tablet, Rfl: 3   Azelastine  HCl 0.15 % SOLN, Place 2 sprays into the nose as needed., Disp: 3 mL, Rfl: 1   Blood Glucose Monitoring Suppl (ONETOUCH VERIO) w/Device KIT, Use as directed to check blood sugars 2 times per day dx: e11.65, Disp: 1 kit, Rfl: 1   cetirizine  (ZYRTEC ) 10 MG tablet, Take by mouth., Disp: , Rfl:    eszopiclone  (LUNESTA ) 1 MG TABS tablet, Take 1 tablet (1 mg total) by mouth at bedtime as needed for sleep. Take immediately before bedtime, Disp: 15 tablet, Rfl: 3   fluticasone  (FLONASE ) 50 MCG/ACT nasal spray, by Nasal route., Disp: , Rfl:    glucose blood (ONETOUCH VERIO)  test strip, Use as directed to check blood sugars 2 times per day dx: e11.65, Disp: 100 each, Rfl: 5   hydrOXYzine  (ATARAX ) 25 MG tablet, TAKE 1 TABLET BY MOUTH EVERY 6 HOURS AS NEEDED, Disp: 30 tablet, Rfl: 0   levocetirizine (XYZAL ) 5 MG tablet, TAKE 1 TABLET BY MOUTH ONCE DAILY IN THE EVENING, Disp: 90 tablet, Rfl: 0   linaclotide  (LINZESS ) 72 MCG capsule, Take 1 capsule (72 mcg total) by mouth daily before breakfast., Disp: 90 capsule, Rfl: 1   meloxicam  (MOBIC ) 15 MG tablet, Take 1 tablet by mouth once daily, Disp: 30 tablet, Rfl: 0   metFORMIN  (GLUCOPHAGE -XR) 750 MG 24 hr tablet, Take 1 tablet (750 mg total) by mouth daily with breakfast., Disp: 90 tablet, Rfl: 1   Multiple Vitamin (MULTIVITAMIN WITH MINERALS) TABS tablet, Take 1 tablet by mouth daily., Disp: , Rfl:    nitroGLYCERIN  (NITROSTAT ) 0.4 MG SL tablet, Place 1 tablet (0.4 mg total) under the tongue every 5 (five) minutes as needed for chest pain., Disp: 25 tablet, Rfl: 4   omeprazole (PRILOSEC) 40 MG capsule, Take 40 mg by mouth daily., Disp: , Rfl:    OneTouch Delica Lancets 33G MISC, Use as directed to check blood sugars 2 times per day dx: e11.65, Disp: 100 each, Rfl: 5   pregabalin  (LYRICA ) 200 MG capsule, Take 1 capsule (200 mg total) by mouth 2 (two) times daily., Disp: 180 capsule, Rfl: 0   pregabalin  (LYRICA )  25 MG capsule, Take 1 capsule (25 mg total) by mouth 3 (three) times daily., Disp: 90 capsule, Rfl: 2   Semaglutide -Weight Management (WEGOVY ) 0.25 MG/0.5ML SOAJ, Inject 0.25 mg into the skin once a week., Disp: 2 mL, Rfl: 0   tiZANidine (ZANAFLEX) 4 MG tablet, Take by mouth., Disp: , Rfl:    venlafaxine  (EFFEXOR ) 25 MG tablet, Take 1 tablet (25 mg total) by mouth 2 (two) times daily., Disp: 180 tablet, Rfl: 1   Medications ordered in this encounter:  Meds ordered this encounter  Medications   amoxicillin -clavulanate (AUGMENTIN ) 875-125 MG tablet    Sig: Take 1 tablet by mouth 2 (two) times daily for 5 days.     Dispense:  10 tablet    Refill:  0    Supervising Provider:   LAMPTEY, PHILIP O [[id number]]   methylPREDNISolone (MEDROL DOSEPAK) 4 MG TBPK tablet    Sig: Take as directed    Dispense:  1 each    Refill:  0    Supervising Provider:   Corine Dice [[id number]]     *If you need refills on other medications prior to your next appointment, please contact your pharmacy*  Follow-Up: Call back or seek an in-person evaluation if the symptoms worsen or if the condition fails to improve as anticipated.  [facility] [phone]  Other Instructions Increase fluids Consider taking otc oral anti-histamine. Start medicines as prescribed Continue with albuterol  inhaler as previously prescribed. Schedule a virtual appointment or follow up at an urgent care clinic if symptoms don't improve.    If you have been instructed to have an in-person evaluation today at a local Urgent Care facility, please use the link below. It will take you to a list of all of our available [facility], including address, phone number and hours of operation. Please do not delay care.  [facility]  If you or a family member do not have a primary care provider, use the link below to schedule a visit and establish care. When you choose a [facility] primary care physician or advanced practice provider, you gain a long-term partner in health. Find a Primary Care Provider  Learn more about [facility] in-office and virtual care options:  - Get Care Now

## 2024-02-22 ENCOUNTER — Other Ambulatory Visit: Payer: Self-pay | Admitting: Nurse Practitioner

## 2024-02-22 DIAGNOSIS — R0982 Postnasal drip: Secondary | ICD-10-CM

## 2024-03-23 ENCOUNTER — Telehealth: Payer: Self-pay | Admitting: Cardiology

## 2024-03-23 ENCOUNTER — Encounter: Payer: Self-pay | Admitting: Cardiology

## 2024-03-23 MED ORDER — ISOSORBIDE MONONITRATE ER 30 MG PO TB24: 30.0000 mg | ORAL_TABLET | Freq: Every day | ORAL | 3 refills | Status: DC

## 2024-03-23 NOTE — Telephone Encounter (Signed)
 Pt c/o of Chest Pain: STAT if active CP, including tightness, pressure, jaw pain, radiating pain to shoulder/upper arm/back, CP unrelieved by Nitro. Symptoms reported of SOB, nausea, vomiting, sweating.  1. Are you having CP right now? No chest pains at this moment    2. Are you experiencing any other symptoms (ex. SOB, nausea, vomiting, sweating)? just feel anxious, a headache, and my blood pressure is 158/89. I have not slept this last night and my bowels have moved more than usual.   3. Is your CP continuous or coming and going? N/A   4. Have you taken Nitroglycerin ? N/A   5. How long have you been experiencing CP? N/A    6. If NO CP at time of call then end call with telling Pt to call back or call 911 if Chest pain returns prior to return call from triage team.    Pulse is 77. EMT called on last Friday due to tightness of chest.  Answers received via pt schedule. Please advise.

## 2024-03-23 NOTE — Telephone Encounter (Signed)
 Spoke with the patient who states that she had an episode of chest tightness and shortness of breath on Thursday so she called 911. They came and checked her out and did an EKG which they told her was normal. They did have concern about her blood pressure which she thinks was 150s/90s. She did end up taking 1 nitroglycerin  tablet on Thursday.  She states that for the past several weeks she has been having headaches and feeling tired. She states that prior to Thursday she was not keeping up with her BP at home. She did monitor it over the weekend and reports readings of 152/89, 160/94 and 158/77. She states that she has been taking amlodipine  10 mg once daily. She denies any further episodes of chest pain but does report that when she gets anxious she does feel some tightness in her chest. She is taking her anxiety medication.  Per last office visit note with Dr. Shlomo it was mentioned of possibly adding on Imdur . She was supposed to keep a log of blood pressures since that visit but did not do so. Advised patient to continue to monitor her BP daily and message will be sent to Dr. Shlomo for further recommendations.  ER precautions were reviewed with patient for recurrence of chest pain. Patient verbalized understanding.

## 2024-03-23 NOTE — Telephone Encounter (Signed)
 MC to patient to advise that Dr. Shlomo recommends to start Imdur  30 mg daily and have her follow-up with PA in 1 to 2 weeks. Orders placed, appt made for 04/06/24.

## 2024-03-24 ENCOUNTER — Encounter: Payer: Self-pay | Admitting: Cardiology

## 2024-03-26 ENCOUNTER — Encounter: Payer: Self-pay | Admitting: Cardiology

## 2024-03-30 ENCOUNTER — Other Ambulatory Visit: Payer: Self-pay | Admitting: Nurse Practitioner

## 2024-04-01 NOTE — Progress Notes (Unsigned)
 Cardiology Office Note   Date:  04/01/2024  ID:  Lisa Casey, DOB 01-11-1961, MRN [id number] PCP: Georgina Speaks, FNP  [facility] Providers Cardiologist:  Soyla DELENA Merck, MD { Click to update primary MD,subspecialty MD or APP then REFRESH:1}    History of Present Illness Lisa Casey is a 63 y.o. female with a past medical history of NSTEMI on 03/2021, obesity, anxiety, pre-DM, fibromyalgia, GERD, HTN, historically short PR interval, former brief tobacco abuse who presents today for follow-up appointment.  History includes emergency department visit 04/05/2021 with central chest pain and abdominal pain that woke her up from sleep.  Troponin 4> 632.  Underwent chest CT which showed normal aorta, abnormal kidneys suggesting nephrocalcinosis, and some calcified coronary artery atherosclerosis.  Underwent cardiac catheterization 04/17/2021 which showed normal coronary anatomy with no plaque.  MRI showed a small area of inferior lateral apical infarct.  Was felt to be related to small distal vessel occlusion secondary to diabetes and was recommended for 1 year of DAPT.  Was last seen by Dr. Shlomo 11/20/2023 and she was doing well at that time.  Had an episode of chest pain a year ago after seeing Dr. Hobart that resolved with sublingual NTG.  Was surrounding a stressful time in her life.  She denied any chest pain/pressure, SOB, DOE, PND, orthopnea, lower extremity edema, dizziness, palpitations (except when she gets stressed), or syncope.  She is compliant with patient and tolerating well.  No side effects.  Today, she***  ROS: Pertinent ROS in HPI  Studies Reviewed      Cardiac catheterization 04/17/2021 No angiographic evidence of CAD LVEDP 15-17 mmHg   Recommendations: No further ischemic testing. Consider coronary vasospasm as the etiology of her chest pain and elevated troponin.    Cardiac MRI 04/18/2021 FINDINGS: LEFT VENTRICLE:   Normal left ventricular chamber  size.   Normal left ventricular wall thickness.   Normal left ventricular systolic function.   LVEF = 69%   There are no regional wall motion abnormalities.   Focal myocardial edema, T2 = 61 msec in the inferolateral apex. Otherwise normal T2 values in myocardium.   Normal first pass perfusion.   There is post contrast delayed myocardial enhancement. There is subendocardial LGE in the lateral apex, appears less than 50% of myocardial thickness. This finding and localization is best visualized in series 38 (Hi Res PSIR). This finding most consistent with small area of infarct with preserved viability.   Normal T1 myocardial nulling kinetics suggest against a diagnosis of cardiac amyloidosis.   ECV = 25%, normal range   RIGHT VENTRICLE:   Normal right ventricular chamber size.   Normal right ventricular wall thickness.   Normal right ventricular systolic function.   RVEF = 58%   There are no regional wall motion abnormalities.   No post contrast delayed myocardial enhancement.   ATRIA:   Normal left atrial size.   Normal right atrial size.   VALVES:   No significant valvular abnormalities.   Flow quantitation of aortic valve shows no significant aortic valve regurgitation, likely trivial.   PERICARDIUM:   Normal pericardium.  No pericardial effusion.   OTHER: No significant extracardiac findings.   MEASUREMENTS: Left ventricle:   LV Female   LV EF: 69% (normal 56-78%)   Absolute volumes:   LV EDV: (Normal 52-141 mL)   LV ESV: 37mL (Normal 13-51 mL)   LV SV: 84mL (Normal 33-97 mL)   CO: 4.8L/min (Normal 2.7-6.0 L/min)  Indexed volumes:   LV EDV: 49mL/sq-m (Normal 41-81 mL/sq-m)   LV ESV: 73mL/sq-m (Normal 12-21 mL/sq-m)   LV SV: 59mL/sq-m (Normal 26-56 mL/sq-m)   CI: 2.33L/min/sq-m (normal 1.8-3.8 L/min/sq-m)   Right ventricle:   RV female   RV EF: 58% (Normal 47-80%)   Absolute volumes:   RV EDV: 127 mL (Normal 58-154  mL)   RV ESV: 53 mL (Normal 12-68 mL)   RV SV: 74 mL (Normal 35-98 mL)   CO: 4.3 L/min (Normal 2.7-6 L/min)   Indexed volumes:   RV EDV: 61 ML/sq-m (Normal 48-87 mL/sq-m)   RV ESV: 25 mL/sq-m (Normal 11-28 mL/sq-m)   RV SV: 36 mL/sq-m (Normal 27-57 mL/sq-m)   CI: 2.07 L/min/sq-m (Normal 1.8-3.8 L/min/sq-m)   IMPRESSION: 1. Normal biventricular chamber size and function. LVEF 69%, RVEF 58%.   2. Study performed to evaluate source of elevated troponin and chest pain with grossly normal epicardial coronary arteries. There is small area of myocardial edema and subendocardial delayed enhancement in the inferolateral LV apex, consistent with infarction. Findings less suggestive of myocarditis.     Risk Assessment/Calculations {Does this patient have ATRIAL FIBRILLATION?:[phone]} No BP recorded.  {Refresh Note OR Click here to enter BP  :1}***       Physical Exam VS:  LMP 08/04/2011        Wt Readings from Last 3 Encounters:  01/27/24 203 lb 12.8 oz (92.4 kg)  11/20/23 197 lb 3.2 oz (89.4 kg)  08/01/23 186 lb 1.1 oz (84.4 kg)    GEN: Well nourished, well developed in no acute distress NECK: No JVD; No carotid bruits CARDIAC: ***RRR, no murmurs, rubs, gallops RESPIRATORY:  Clear to auscultation without rales, wheezing or rhonchi  ABDOMEN: Soft, non-tender, non-distended EXTREMITIES:  No edema; No deformity   ASSESSMENT AND PLAN NSTEMI HTN    {Are you ordering a CV Procedure (e.g. stress test, cath, DCCV, TEE, etc)?   Press F2        :[id number]}  Dispo: ***  Signed, Orren LOISE Fabry, PA-C

## 2024-04-03 ENCOUNTER — Encounter: Payer: Self-pay | Admitting: Physician Assistant

## 2024-04-03 ENCOUNTER — Ambulatory Visit: Attending: Physician Assistant | Admitting: Physician Assistant

## 2024-04-03 VITALS — BP 116/76 | HR 82 | Ht 65.0 in | Wt 198.2 lb

## 2024-04-03 DIAGNOSIS — R0989 Other specified symptoms and signs involving the circulatory and respiratory systems: Secondary | ICD-10-CM

## 2024-04-03 DIAGNOSIS — I10 Essential (primary) hypertension: Secondary | ICD-10-CM | POA: Diagnosis not present

## 2024-04-03 DIAGNOSIS — I214 Non-ST elevation (NSTEMI) myocardial infarction: Secondary | ICD-10-CM | POA: Diagnosis not present

## 2024-04-03 NOTE — Patient Instructions (Addendum)
 Medication Instructions:  No medication changes were made during today's visit.  *If you need a refill on your cardiac medications before your next appointment, please call your pharmacy*   Lab Work: No labs were ordered during today's visit.  If you have labs (blood work) drawn today and your tests are completely normal, you will receive your results only by: MyChart Message (if you have MyChart) OR A paper copy in the mail If you have any lab test that is abnormal or we need to change your treatment, we will call you to review the results.   Testing/Procedures: Your physician has requested that you have a carotid duplex. This test is an ultrasound of the carotid arteries in your neck. It looks at blood flow through these arteries that supply the brain with blood. Allow one hour for this exam. There are no restrictions or special instructions.    Follow-Up: At [facility], you and your health needs are our priority.  As part of our continuing mission to provide you with exceptional heart care, we have created designated Provider Care Teams.  These Care Teams include your primary Cardiologist (physician) and Advanced Practice Providers (APPs -  Physician Assistants and Nurse Practitioners) who all work together to provide you with the care you need, when you need it.  We recommend signing up for the patient portal called MyChart.  Sign up information is provided on this After Visit Summary.  MyChart is used to connect with patients for Virtual Visits (Telemedicine).  Patients are able to view lab/test results, encounter notes, upcoming appointments, etc.  Non-urgent messages can be sent to your provider as well.   To learn more about what you can do with MyChart, go to [email or web address].    Your next appointment:   6 month(s)  Provider:   Wilbert Bihari, MD   Other Instructions Thank you for choosing [facility]! A letter will be mailed to you as a reminder to  call the office for your next follow up appointment.

## 2024-04-06 ENCOUNTER — Ambulatory Visit: Admitting: Physician Assistant

## 2024-04-13 ENCOUNTER — Telehealth: Payer: Self-pay | Admitting: *Deleted

## 2024-04-13 NOTE — Telephone Encounter (Signed)
   Pre-operative Risk Assessment    Patient Name: MAIRLYN TEGTMEYER  DOB: 09-12-1961 MRN: [id number]   Date of last office visit: 04/03/24 ORREN FABRY, [facility] Date of next office visit: NONE   Request for Surgical Clearance    Procedure:  ENDOSCOPIC U/S PER FORM  Date of Surgery:  Clearance 05/08/24                                Surgeon:  DR. HUNG Surgeon's Group or Practice Name:  [facility] Phone number:  [phone] Fax number:  [phone]   Type of Clearance Requested:   - Medical ; NONE PER FORM    Type of Anesthesia:  PROPOFOL    Additional requests/questions:    Bonney Niels Jest   04/13/2024, 5:19 PM

## 2024-04-14 NOTE — Telephone Encounter (Signed)
 Orren,  You saw this patient on 04/03/2024. Per office protocol, will you please comment on medical clearance for endoscopy on 8/22?  Please route your response to P CV DIV Preop. I will communicate with requesting office once you have given recommendations.   Thank you!  Barnie Hila, NP

## 2024-04-14 NOTE — Telephone Encounter (Signed)
   Name: Lisa Casey  DOB: 03-06-1961  MRN: [id number]   Primary Cardiologist: Soyla DELENA Merck, MD  Chart reviewed as part of pre-operative protocol coverage. Lisa Casey was last seen on 04/03/2024 by Orren Fabry, PA-C.  Per Orren, When I saw her she was not having chest pain. She does meet mets for clearance. Endoscopy is a low risk procedure. Can proceed without any further CV testing.  Therefore, based on ACC/AHA guidelines, the patient would be an acceptable risk for the planned procedure without further cardiovascular testing.   I will route this recommendation to the requesting party via Epic fax function and remove from pre-op pool. Please call with questions.  Barnie Hila, NP 04/14/2024, 4:47 PM

## 2024-04-16 ENCOUNTER — Ambulatory Visit (HOSPITAL_COMMUNITY)
Admission: RE | Admit: 2024-04-16 | Discharge: 2024-04-16 | Disposition: A | Discharge status: Home / Self Care | Source: Ambulatory Visit | Attending: Physician Assistant | Admitting: Physician Assistant

## 2024-04-16 DIAGNOSIS — R0989 Other specified symptoms and signs involving the circulatory and respiratory systems: Secondary | ICD-10-CM | POA: Diagnosis present

## 2024-04-17 ENCOUNTER — Ambulatory Visit: Payer: Self-pay | Admitting: Physician Assistant

## 2024-04-20 ENCOUNTER — Other Ambulatory Visit: Payer: Self-pay | Admitting: Gastroenterology

## 2024-04-24 ENCOUNTER — Other Ambulatory Visit: Payer: Self-pay | Admitting: Nurse Practitioner

## 2024-04-29 ENCOUNTER — Encounter (HOSPITAL_COMMUNITY): Payer: Self-pay | Admitting: Gastroenterology

## 2024-05-06 ENCOUNTER — Encounter (HOSPITAL_COMMUNITY): Payer: Self-pay

## 2024-05-06 ENCOUNTER — Emergency Department (HOSPITAL_COMMUNITY)

## 2024-05-06 ENCOUNTER — Emergency Department (HOSPITAL_COMMUNITY)
Admission: EM | Admit: 2024-05-06 | Discharge: 2024-05-06 | Disposition: A | Discharge status: Home / Self Care | Attending: Emergency Medicine | Admitting: Emergency Medicine

## 2024-05-06 ENCOUNTER — Other Ambulatory Visit: Payer: Self-pay

## 2024-05-06 DIAGNOSIS — R1084 Generalized abdominal pain: Secondary | ICD-10-CM | POA: Diagnosis present

## 2024-05-06 DIAGNOSIS — K8020 Calculus of gallbladder without cholecystitis without obstruction: Secondary | ICD-10-CM | POA: Insufficient documentation

## 2024-05-06 LAB — COMPREHENSIVE METABOLIC PANEL WITH GFR
ALT: 13 U/L (ref 0–44)
AST: 19 U/L (ref 15–41)
Albumin: 3.8 g/dL (ref 3.5–5.0)
Alkaline Phosphatase: 61 U/L (ref 38–126)
Anion gap: 11 (ref 5–15)
BUN: 16 mg/dL (ref 8–23)
CO2: 21 mmol/L — ABNORMAL LOW (ref 22–32)
Calcium: 9.4 mg/dL (ref 8.9–10.3)
Chloride: 107 mmol/L (ref 98–111)
Creatinine, Ser: 0.59 mg/dL (ref 0.44–1.00)
GFR, Estimated: >60 mL/min (ref >60)
Glucose, Bld: 121 mg/dL — ABNORMAL HIGH (ref 70–99)
Potassium: 3.6 mmol/L (ref 3.5–5.1)
Sodium: 139 mmol/L (ref 135–145)
Total Bilirubin: 1.4 mg/dL — ABNORMAL HIGH (ref 0.0–1.2)
Total Protein: 7.3 g/dL (ref 6.5–8.1)

## 2024-05-06 LAB — URINALYSIS, ROUTINE W REFLEX MICROSCOPIC
Bacteria, UA: NONE SEEN
Bilirubin Urine: NEGATIVE
Glucose, UA: NEGATIVE mg/dL
Ketones, ur: 5 mg/dL — AB
Leukocytes,Ua: NEGATIVE
Nitrite: NEGATIVE
Protein, ur: NEGATIVE mg/dL
Specific Gravity, Urine: 1.012 (ref 1.005–1.030)
pH: 8 (ref 5.0–8.0)

## 2024-05-06 LAB — CBC WITH DIFFERENTIAL/PLATELET
Abs Immature Granulocytes: 0 K/uL (ref 0.00–0.07)
Basophils Absolute: 0 K/uL (ref 0.0–0.1)
Basophils Relative: 1 %
Eosinophils Absolute: 0.1 K/uL (ref 0.0–0.5)
Eosinophils Relative: 2 %
HCT: 43.1 % (ref 36.0–46.0)
Hemoglobin: 14.1 g/dL (ref 12.0–15.0)
Immature Granulocytes: 0 %
Lymphocytes Relative: 50 %
Lymphs Abs: 1.9 K/uL (ref 0.7–4.0)
MCH: 29.6 pg (ref 26.0–34.0)
MCHC: 32.7 g/dL (ref 30.0–36.0)
MCV: 90.5 fL (ref 80.0–100.0)
Monocytes Absolute: 0.3 K/uL (ref 0.1–1.0)
Monocytes Relative: 9 %
Neutro Abs: 1.4 K/uL — ABNORMAL LOW (ref 1.7–7.7)
Neutrophils Relative %: 38 %
Platelets: 226 K/uL (ref 150–400)
RBC: 4.76 MIL/uL (ref 3.87–5.11)
RDW: 12.1 % (ref 11.5–15.5)
WBC: 3.8 K/uL — ABNORMAL LOW (ref 4.0–10.5)
nRBC: 0 % (ref 0.0–0.2)

## 2024-05-06 LAB — LIPASE, BLOOD: Lipase: 35 U/L (ref 11–51)

## 2024-05-06 LAB — PROTIME-INR
INR: 0.9 (ref 0.8–1.2)
Prothrombin Time: 12.6 s (ref 11.4–15.2)

## 2024-05-06 MED ORDER — LACTATED RINGERS IV BOLUS
1000.0000 mL | Freq: Once | INTRAVENOUS | Status: AC
  Administered 2024-05-06: 1000 mL via INTRAVENOUS

## 2024-05-06 MED ORDER — OXYCODONE-ACETAMINOPHEN 5-325 MG PO TABS
1.0000 | ORAL_TABLET | Freq: Three times a day (TID) | ORAL | 0 refills | Status: DC | PRN
Start: 2024-05-06 — End: 2024-07-27

## 2024-05-06 MED ORDER — ONDANSETRON 8 MG PO TBDP: 8.0000 mg | ORAL_TABLET | Freq: Three times a day (TID) | ORAL | 0 refills | Status: DC | PRN

## 2024-05-06 MED ORDER — DIAZEPAM 5 MG/ML IJ SOLN
2.5000 mg | Freq: Once | INTRAMUSCULAR | Status: AC
  Administered 2024-05-06: 2.5 mg via INTRAVENOUS
  Filled 2024-05-06: qty 2

## 2024-05-06 MED ORDER — MORPHINE SULFATE (PF) 4 MG/ML IV SOLN
6.0000 mg | Freq: Once | INTRAVENOUS | Status: AC
  Administered 2024-05-06: 6 mg via INTRAVENOUS
  Filled 2024-05-06: qty 2

## 2024-05-06 MED ORDER — ONDANSETRON HCL 4 MG/2ML IJ SOLN
4.0000 mg | Freq: Once | INTRAMUSCULAR | Status: AC
  Administered 2024-05-06: 4 mg via INTRAVENOUS
  Filled 2024-05-06: qty 2

## 2024-05-06 MED ORDER — IOHEXOL 300 MG/ML  SOLN
100.0000 mL | Freq: Once | INTRAMUSCULAR | Status: AC | PRN
  Administered 2024-05-06: 100 mL via INTRAVENOUS

## 2024-05-06 NOTE — ED Provider Triage Note (Signed)
 Emergency Medicine Provider Triage Evaluation Note  Lisa Casey , a 63 y.o. female  was evaluated in triage.  Pt complains of abdominal pain.  Pt scheduled for endoscopy for evaluation   Review of Systems  Positive: Abdominal pain Negative: fever  Physical Exam  BP (!) 144/82 (BP Location: Left Arm)   Pulse 66   Temp 98.4 F (36.9 C) (Oral)   Resp 16   LMP 08/04/2011   SpO2 100%  Gen:   Awake, moaning Resp:  Normal effort  MSK:   Moves extremities without difficulty  Other:    Medical Decision Making  Medically screening exam initiated at 10:36 AM.  Appropriate orders placed.  Lisa Casey was informed that the remainder of the evaluation will be completed by another provider, this initial triage assessment does not replace that evaluation, and the importance of remaining in the ED until their evaluation is complete.     Flint Sonny POUR, [place] 05/06/24 1043

## 2024-05-06 NOTE — Discharge Instructions (Addendum)
 We saw you in the ER for abdominal pain. The workup in the ER is overall reassuring.  CT scan shows gallstones but no cholecystitis.  Liver function test is normal. We suspect that your pain could be because of gallstones.  The workup in the ER is not complete, and is limited to screening for life threatening and emergent conditions only, so please consider following up with general surgery by calling the number provided.

## 2024-05-06 NOTE — ED Provider Notes (Addendum)
 [place] EMERGENCY DEPARTMENT AT [facility] Provider Note   CSN: [id number] Arrival date & time: 05/06/24  1001     Patient presents with: Abdominal Pain   Lisa Casey is a 63 y.o. female.   HPI     64 year old female comes in with chief complaint of abdominal pain.  Patient has history of uterine fibroids, cholelithiasis and chronic abdominal discomfort.  She is established with Dr. Rollin, GI.  Patient states that she has history of epigastric abdominal discomfort, however last night she started experiencing generalized abdominal pain with nausea.  She is supposed to get upper endoscopy completed by her GI doctor this Friday.  She had called her GI doctor, who advised that she come to the emergency room.  Patient denies any bloody stools, bloody emesis.  She is also having some chills.  Symptoms were unrelated to p.o. intake.  Prior to Admission medications   Medication Sig Start Date End Date Taking? Authorizing Provider  acetaminophen  (TYLENOL ) 500 MG tablet Take 1,000 mg by mouth every 6 (six) hours as needed for moderate pain or headache.   Yes [provider]  ondansetron  (ZOFRAN -ODT) 8 MG disintegrating tablet Take 1 tablet (8 mg total) by mouth every 8 (eight) hours as needed for nausea. 05/06/24  Yes Charlyn Sora, MD  oxyCODONE -acetaminophen  (PERCOCET/ROXICET) 5-325 MG tablet Take 1 tablet by mouth every 8 (eight) hours as needed for severe pain (pain score 7-10). 05/06/24  Yes Charlyn Sora, MD  albuterol  (VENTOLIN  HFA) 108 (90 Base) MCG/ACT inhaler Inhale 2 puffs into the lungs every 6 (six) hours as needed for wheezing or shortness of breath.    [provider]  amLODipine  (NORVASC ) 10 MG tablet Take 1 tablet (10 mg total) by mouth daily. 11/20/23 05/06/24  Shlomo Wilbert SAUNDERS, MD  Azelastine  HCl 0.15 % SOLN Place 2 sprays into the nose as needed. 07/24/23   Georgina Speaks, FNP  Blood Glucose Monitoring Suppl (ONETOUCH VERIO) w/Device KIT  Use as directed to check blood sugars 2 times per day dx: e11.65 04/20/21   Ghumman, Ramandeep, NP  cetirizine  (ZYRTEC ) 10 MG tablet Take by mouth. 02/23/19   [provider]  eszopiclone  (LUNESTA ) 1 MG TABS tablet Take 1 tablet (1 mg total) by mouth at bedtime as needed for sleep. Take immediately before bedtime 10/07/23   Georgina Speaks, FNP  fluticasone  (FLONASE ) 50 MCG/ACT nasal spray by Nasal route. 09/23/18   [provider]  glucose blood (ONETOUCH VERIO) test strip Use as directed to check blood sugars 2 times per day dx: e11.65 04/20/21   Ghumman, Ramandeep, NP  hydrOXYzine  (ATARAX ) 25 MG tablet TAKE 1 TABLET BY MOUTH EVERY 6 HOURS AS NEEDED 04/27/24   Georgina Speaks, FNP  isosorbide  mononitrate (IMDUR ) 30 MG 24 hr tablet Take 1 tablet (30 mg total) by mouth daily. 03/23/24 06/21/24  Shlomo Wilbert SAUNDERS, MD  levocetirizine (XYZAL ) 5 MG tablet TAKE 1 TABLET BY MOUTH ONCE DAILY IN THE EVENING 02/27/24   Moore, Janece, FNP  linaclotide  (LINZESS ) 72 MCG capsule Take 1 capsule (72 mcg total) by mouth daily before breakfast. 01/27/24   Georgina Speaks, FNP  meloxicam  (MOBIC ) 15 MG tablet Take 1 tablet by mouth once daily 12/24/23   Moore, Janece, FNP  metFORMIN  (GLUCOPHAGE -XR) 750 MG 24 hr tablet Take 1 tablet (750 mg total) by mouth daily with breakfast. 10/17/23   Georgina Speaks, FNP  methylPREDNISolone  (MEDROL  DOSEPAK) 4 MG TBPK tablet Take as directed 02/13/24   Gandhi, Safal, PA-C  metoprolol  succinate (TOPROL -XL) 25 MG 24 hr tablet Take 25 mg by mouth daily.    [provider]  Multiple Vitamin (MULTIVITAMIN WITH MINERALS) TABS tablet Take 1 tablet by mouth daily.    [provider]  nitroGLYCERIN  (NITROSTAT ) 0.4 MG SL tablet Place 1 tablet (0.4 mg total) under the tongue every 5 (five) minutes as needed for chest pain. 03/23/22   Hobart Powell BRAVO, MD  omeprazole (PRILOSEC) 40 MG capsule Take 40 mg by mouth daily. 09/02/22   [provider]  OneTouch Delica Lancets 33G  MISC Use as directed to check blood sugars 2 times per day dx: e11.65 04/20/21   Ghumman, Ramandeep, NP  phentermine (ADIPEX-P) 37.5 MG tablet Take 37.5 mg by mouth daily. 02/26/24   [provider]  pregabalin  (LYRICA ) 200 MG capsule Take 1 capsule (200 mg total) by mouth 2 (two) times daily. 11/25/23   Georgina Speaks, FNP  pregabalin  (LYRICA ) 25 MG capsule Take 1 capsule (25 mg total) by mouth 3 (three) times daily. 11/22/23 11/21/24  Georgina Speaks, FNP  tiZANidine (ZANAFLEX) 4 MG tablet Take by mouth. 11/15/23   [provider]  venlafaxine  (EFFEXOR ) 25 MG tablet Take 1 tablet (25 mg total) by mouth 2 (two) times daily. 10/17/23   Georgina Speaks, FNP    Allergies: Codeine    Review of Systems  All other systems reviewed and are negative.   Updated Vital Signs BP (!) 151/73   Pulse 66   Temp 98.5 F (36.9 C) (Oral)   Resp 18   Ht 5' 5 (1.651 m)   Wt 85.7 kg   LMP 08/04/2011   SpO2 100%   BMI 31.45 kg/m   Physical Exam Vitals and nursing note reviewed.  Constitutional:      Appearance: She is well-developed.  HENT:     Head: Atraumatic.  Cardiovascular:     Rate and Rhythm: Normal rate.  Pulmonary:     Effort: Pulmonary effort is normal.  Abdominal:     Tenderness: There is generalized abdominal tenderness and tenderness in the epigastric area. Negative signs include Murphy's sign and McBurney's sign.  Musculoskeletal:     Cervical back: Normal range of motion and neck supple.  Skin:    General: Skin is warm and dry.  Neurological:     Mental Status: She is alert and oriented to person, place, and time.     (all labs ordered are listed, but only abnormal results are displayed) Labs Reviewed  CBC WITH DIFFERENTIAL/PLATELET - Abnormal; Notable for the following components:      Result Value   WBC 3.8 (*)    Neutro Abs 1.4 (*)    All other components within normal limits  COMPREHENSIVE METABOLIC PANEL WITH GFR - Abnormal; Notable for the following  components:   CO2 21 (*)    Glucose, Bld 121 (*)    Total Bilirubin 1.4 (*)    All other components within normal limits  URINALYSIS, ROUTINE W REFLEX MICROSCOPIC - Abnormal; Notable for the following components:   Color, Urine STRAW (*)    Hgb urine dipstick SMALL (*)    Ketones, ur 5 (*)    All other components within normal limits  LIPASE, BLOOD  PROTIME-INR    EKG: None  Radiology: CT ABDOMEN PELVIS W CONTRAST Result Date: 05/06/2024 CLINICAL DATA:  Acute epigastric abdominal pain. EXAM: CT ABDOMEN AND PELVIS WITH CONTRAST TECHNIQUE: Multidetector CT imaging of the abdomen and pelvis was performed using the standard protocol following  bolus administration of intravenous contrast. RADIATION DOSE REDUCTION: This exam was performed according to the departmental dose-optimization program which includes automated exposure control, adjustment of the mA and/or kV according to patient size and/or use of iterative reconstruction technique. CONTRAST:  OMNIPAQUE  IOHEXOL  300 MG/ML  SOLN COMPARISON:  April 15, 2021. FINDINGS: Lower chest: No acute abnormality. Hepatobiliary: Minimal cholelithiasis. No biliary dilatation. Liver is unremarkable. Pancreas: Unremarkable. No pancreatic ductal dilatation or surrounding inflammatory changes. Spleen: Normal in size without focal abnormality. Adrenals/Urinary Tract: Adrenal glands are unremarkable. Kidneys are normal, without renal calculi, focal lesion, or hydronephrosis. Bladder is unremarkable. Stomach/Bowel: Stomach is within normal limits. Appendix appears normal. No evidence of bowel wall thickening, distention, or inflammatory changes. Vascular/Lymphatic: No significant vascular findings are present. No enlarged abdominal or pelvic lymph nodes. Reproductive: Status post hysterectomy. No adnexal masses. Other: No abdominal wall hernia or abnormality. No abdominopelvic ascites. Musculoskeletal: No acute or significant osseous findings. IMPRESSION: 1.  Minimal cholelithiasis. No definite evidence of cholecystitis. 2. No other abnormality seen in the abdomen or pelvis. Electronically Signed   By: Lynwood Landy Raddle M.D.   On: 05/06/2024 15:35     Procedures   Medications Ordered in the ED  diazepam  (VALIUM ) injection 2.5 mg (2.5 mg Intravenous Given 05/06/24 1148)  morphine  (PF) 4 MG/ML injection 6 mg (6 mg Intravenous Given 05/06/24 1404)  ondansetron  (ZOFRAN ) injection 4 mg (4 mg Intravenous Given 05/06/24 1403)  lactated ringers  bolus 1,000 mL (1,000 mLs Intravenous New Bag/Given 05/06/24 1412)  iohexol  (OMNIPAQUE ) 300 MG/ML solution 100 mL (100 mLs Intravenous Contrast Given 05/06/24 1439)    Clinical Course as of 05/06/24 1549  Wed May 06, 2024  1531 Initial workup reveals normal LFTs.  CT pending at this time.  Dispo per CT.  Patient's care has been signed out to incoming team. [AN]    Clinical Course User Index [AN] Charlyn Sora, MD                                 Medical Decision Making Amount and/or Complexity of Data Reviewed Radiology: ordered.  Risk Prescription drug management. Decision regarding hospitalization.   63 year old patient comes in with cc of Abdominal pain. Pertinent past medical includes cholelithiasis, choledocholithiasis, cardiac cath in 2022 that was reassuring and uterine fibroids. Usually she has epigastric pain, but today the pain is generalized. Collateral history provided by reviewing patient's records including cardiology visits, MRCP and GI note.  Differential diagnosis considered for this patient includes: Pancreatitis, Hepatobiliary pathology including cholelithiasis and cholecystitis, Gastritis/peptic ulcer disease, small bowel obstruction, Acute coronary syndrome.  Plan is to focus on GI labs and get CT abdomen and pelvis with contrast.  On exam, she really did not have Murphy's.  Given that the pain is generalized, CT favor ultrasound at this time.  3:49 PM CT scan is reassuring. On  reassessment, patient continues to have no Murphy's.  She continues to complain of epigastric discomfort as the primary area of pain, but indicates that currently she is pain-free.  CT results discussed with her.  Advised that she probably needs to consider following up with general surgery for what appears to be cholelithiasis related pain.  The patient appears reasonably screened and/or stabilized for discharge and I doubt any other medical condition or other [facility] requiring further screening, evaluation, or treatment in the ED at this time prior to discharge.   Results from the ER workup  discussed with the patient face to face and all questions answered to the best of my ability. The patient is safe for discharge with strict return precautions.    Final diagnoses:  Calculus of gallbladder without cholecystitis without obstruction    ED Discharge Orders          Ordered    oxyCODONE -acetaminophen  (PERCOCET/ROXICET) 5-325 MG tablet  Every 8 hours PRN        05/06/24 1547    ondansetron  (ZOFRAN -ODT) 8 MG disintegrating tablet  Every 8 hours PRN        05/06/24 1547               Charlyn Sora, MD 05/06/24 1531    Charlyn Sora, MD 05/06/24 1549

## 2024-05-06 NOTE — ED Triage Notes (Signed)
 Pt arrived via EMS, from home. C/o generalized abd pain and nausea. States scheduled for endoscopy this Friday but worsening pain.

## 2024-05-07 NOTE — Anesthesia Preprocedure Evaluation (Signed)
 Anesthesia Evaluation  Patient identified by MRN, date of birth, ID band Patient awake    Reviewed: Allergy & Precautions, NPO status , Patient's Chart, lab work & pertinent test results  History of Anesthesia Complications Negative for: history of anesthetic complications  Airway Mallampati: III  TM Distance: >3 FB Neck ROM: Full    Dental  (+) Dental Advisory Given   Pulmonary neg shortness of breath, sleep apnea , neg COPD, neg recent URI, former smoker   Pulmonary exam normal breath sounds clear to auscultation       Cardiovascular hypertension (amlodipine , ISMN, metoprolol ), Pt. on medications (-) angina + Past MI (03/2021)  (-) Cardiac Stents and (-) CABG + dysrhythmias (prolonged QT)  Rhythm:Regular Rate:Normal  Previous grade I view with Miller 2, easy mask with OPA  LHC 04/17/2021: No angiographic evidence of CAD LVEDP 15-17 mmHg  TTE 04/15/2021: IMPRESSIONS    1. Left ventricular ejection fraction, by estimation, is 65 to 70%. The  left ventricle has normal function. The left ventricle has no regional  wall motion abnormalities. Left ventricular diastolic parameters were  normal.   2. Right ventricular systolic function is normal. The right ventricular  size is normal. There is normal pulmonary artery systolic pressure.   3. The mitral valve is normal in structure. Trivial mitral valve  regurgitation. No evidence of mitral stenosis.   4. The aortic valve is tricuspid. Aortic valve regurgitation is not  visualized. No aortic stenosis is present.   5. The inferior vena cava is normal in size with greater than 50%  respiratory variability, suggesting right atrial pressure of 3 mmHg.     Neuro/Psych  Headaches, neg Seizures PSYCHIATRIC DISORDERS Anxiety Depression    Chronic pain syndrome    GI/Hepatic Neg liver ROS,GERD  Medicated,,Cholelithiasis    Endo/Other  diabetes, Type 2, Oral Hypoglycemic Agents     Renal/GU negative Renal ROS     Musculoskeletal  (+)  Fibromyalgia -  Abdominal  (+) + obese  Peds  Hematology negative hematology ROS (+) Lab Results      Component                Value               Date                      WBC                      3.8 (L)             05/06/2024                HGB                      14.1                05/06/2024                HCT                      43.1                05/06/2024                MCV                      90.5  05/06/2024                PLT                      226                 05/06/2024              Anesthesia Other Findings Last phentermine: >1 month ago  Reproductive/Obstetrics                              Anesthesia Physical Anesthesia Plan  ASA: 3  Anesthesia Plan: MAC   Post-op Pain Management: Minimal or no pain anticipated   Induction: Intravenous  PONV Risk Score and Plan: 2 and Propofol  infusion, TIVA and Treatment may vary due to age or medical condition  Airway Management Planned: Natural Airway and Nasal Cannula  Additional Equipment:   Intra-op Plan:   Post-operative Plan:   Informed Consent: I have reviewed the patients History and Physical, chart, labs and discussed the procedure including the risks, benefits and alternatives for the proposed anesthesia with the patient or authorized representative who has indicated his/her understanding and acceptance.     Dental advisory given  Plan Discussed with: Anesthesiologist and CRNA  Anesthesia Plan Comments: (Discussed with patient risks of MAC including, but not limited to, minor pain or discomfort, hearing people in the room, and possible need for backup general anesthesia. Risks for general anesthesia also discussed including, but not limited to, sore throat, hoarse voice, chipped/damaged teeth, injury to vocal cords, nausea and vomiting, allergic reactions, lung infection, heart attack, stroke, and  death. All questions answered. )         Anesthesia Quick Evaluation

## 2024-05-08 ENCOUNTER — Encounter (HOSPITAL_COMMUNITY): Payer: Self-pay | Admitting: Gastroenterology

## 2024-05-08 ENCOUNTER — Ambulatory Visit (HOSPITAL_COMMUNITY)
Admission: RE | Admit: 2024-05-08 | Discharge: 2024-05-08 | Disposition: A | Discharge status: Home / Self Care | Attending: Gastroenterology | Admitting: Gastroenterology

## 2024-05-08 ENCOUNTER — Encounter (HOSPITAL_COMMUNITY): Payer: Self-pay | Admitting: Anesthesiology

## 2024-05-08 ENCOUNTER — Other Ambulatory Visit: Payer: Self-pay

## 2024-05-08 ENCOUNTER — Ambulatory Visit (HOSPITAL_BASED_OUTPATIENT_CLINIC_OR_DEPARTMENT_OTHER): Payer: Self-pay | Admitting: Anesthesiology

## 2024-05-08 ENCOUNTER — Encounter (HOSPITAL_COMMUNITY)
Admission: RE | Disposition: A | Discharge status: Home / Self Care | Payer: Self-pay | Source: Home / Self Care | Attending: Gastroenterology

## 2024-05-08 DIAGNOSIS — E119 Type 2 diabetes mellitus without complications: Secondary | ICD-10-CM | POA: Insufficient documentation

## 2024-05-08 DIAGNOSIS — M797 Fibromyalgia: Secondary | ICD-10-CM | POA: Insufficient documentation

## 2024-05-08 DIAGNOSIS — I252 Old myocardial infarction: Secondary | ICD-10-CM | POA: Insufficient documentation

## 2024-05-08 DIAGNOSIS — Z87891 Personal history of nicotine dependence: Secondary | ICD-10-CM | POA: Diagnosis not present

## 2024-05-08 DIAGNOSIS — F418 Other specified anxiety disorders: Secondary | ICD-10-CM | POA: Diagnosis not present

## 2024-05-08 DIAGNOSIS — G894 Chronic pain syndrome: Secondary | ICD-10-CM | POA: Insufficient documentation

## 2024-05-08 DIAGNOSIS — K219 Gastro-esophageal reflux disease without esophagitis: Secondary | ICD-10-CM | POA: Diagnosis not present

## 2024-05-08 DIAGNOSIS — R1013 Epigastric pain: Secondary | ICD-10-CM | POA: Diagnosis present

## 2024-05-08 DIAGNOSIS — K8020 Calculus of gallbladder without cholecystitis without obstruction: Secondary | ICD-10-CM | POA: Diagnosis not present

## 2024-05-08 DIAGNOSIS — R1011 Right upper quadrant pain: Secondary | ICD-10-CM | POA: Diagnosis present

## 2024-05-08 DIAGNOSIS — I10 Essential (primary) hypertension: Secondary | ICD-10-CM | POA: Insufficient documentation

## 2024-05-08 HISTORY — PX: EUS: SHX5427

## 2024-05-08 HISTORY — PX: ESOPHAGOGASTRODUODENOSCOPY: SHX5428

## 2024-05-08 LAB — GLUCOSE, CAPILLARY
Glucose-Capillary: 88 mg/dL (ref 70–99)
Glucose-Capillary: 98 mg/dL (ref 70–99)

## 2024-05-08 SURGERY — ULTRASOUND, UPPER GI TRACT, ENDOSCOPIC
Anesthesia: Monitor Anesthesia Care

## 2024-05-08 MED ORDER — PROPOFOL 10 MG/ML IV BOLUS
INTRAVENOUS | Status: DC | PRN
  Administered 2024-05-08: 50 mg via INTRAVENOUS
  Administered 2024-05-08: 30 mg via INTRAVENOUS

## 2024-05-08 MED ORDER — SODIUM CHLORIDE 0.9 % IV SOLN: INTRAVENOUS | Status: DC

## 2024-05-08 MED ORDER — LACTATED RINGERS IV SOLN: INTRAVENOUS | Status: DC | PRN

## 2024-05-08 MED ORDER — DEXMEDETOMIDINE HCL IN NACL 80 MCG/20ML IV SOLN
INTRAVENOUS | Status: DC | PRN
  Administered 2024-05-08: 6 ug via INTRAVENOUS

## 2024-05-08 MED ORDER — PROPOFOL 500 MG/50ML IV EMUL
INTRAVENOUS | Status: DC | PRN
  Administered 2024-05-08: 125 ug/kg/min via INTRAVENOUS

## 2024-05-08 MED ORDER — SODIUM CHLORIDE 0.9 % IV SOLN
INTRAVENOUS | Status: AC | PRN
  Administered 2024-05-08: 500 mL via INTRAMUSCULAR

## 2024-05-08 NOTE — H&P (Signed)
 Lisa Casey HPI: She was in the ER 2 days ago for epigastric pain.  The work up was unrevealing.  It did show cholelithiasis again, but her biliary ducts were normal and there was no evidence of any liver enzyme elevations.  Her pain did resolve in the ER.  She is here today, as scheduled, for an EUS to evaluate for any evidence of stones/sludge in the CBD.  Past Medical History:  Diagnosis Date   Abdominal pain 05/26/2022   Anxiety    HX OF ANXIETY ATTACKS   Back pain, chronic    Bronchitis    Cholelithiasis 04/15/2021   Diabetes (HCC)    started Metformin  in 02/2021   Fibromyalgia    GERD (gastroesophageal reflux disease)    H/O total hysterectomy 07/05/2022   Cervix removed per patient   History of kidney stones    Hypertension    Migraines    HX OF MIGRAINES   Myocardial infarction [facility])    july 2022   Obesity    Pneumonia    06-2021   Shortened PR interval     Past Surgical History:  Procedure Laterality Date   CONSTIPATION     CYSTOSCOPY W/ URETERAL STENT PLACEMENT  09/23/2012   Procedure: CYSTOSCOPY WITH RETROGRADE PYELOGRAM/URETERAL STENT PLACEMENT;  Surgeon: Ricardo Likens, MD;  Location: [facility];  Service: Urology;  Laterality: Right;   CYSTOSCOPY WITH RETROGRADE PYELOGRAM, URETEROSCOPY AND STENT PLACEMENT  10/08/2012   Procedure: CYSTOSCOPY WITH RETROGRADE PYELOGRAM, URETEROSCOPY AND STENT PLACEMENT;  Surgeon: Ricardo Likens, MD;  Location: [facility];  Service: Urology;  Laterality: Right;  90 MIN NO RETROGRADE NEEDS FLEX URETEROSCOPE    CYSTOSCOPY/URETEROSCOPY/HOLMIUM LASER/STENT PLACEMENT Right 08/21/2021   Procedure: CYSTOSCOPY RIGHT /URETEROSCOPY/HOLMIUM LASER/STENT PLACEMENT;  Surgeon: Carolee Sherwood JONETTA DOUGLAS, MD;  Location: [facility];  Service: Urology;  Laterality: Right;   HOLMIUM LASER APPLICATION  10/08/2012   Procedure: HOLMIUM LASER APPLICATION;  Surgeon: Ricardo Likens, MD;  Location: [facility];  Service: Urology;  Laterality:  Right;   LEFT HEART CATH AND CORONARY ANGIOGRAPHY N/A 04/17/2021   Procedure: LEFT HEART CATH AND CORONARY ANGIOGRAPHY;  Surgeon: Verlin Lonni JONETTA, MD;  Location: [facility];  Service: Cardiovascular;  Laterality: N/A;   LEFT HEART CATHETERIZATION WITH CORONARY ANGIOGRAM N/A 10/29/2011   Procedure: LEFT HEART CATHETERIZATION WITH CORONARY ANGIOGRAM;  Surgeon: Alm LELON Clay, MD;  Location: [facility] CATH LAB;  Service: Cardiovascular;  Laterality: N/A;   ROBOTIC ASSISTED LAP VAGINAL HYSTERECTOMY  09/14/2011   Procedure: ROBOTIC ASSISTED LAPAROSCOPIC VAGINAL HYSTERECTOMY;  Surgeon: Olam DELENA Mill, MD;  Location: [facility];  Service: Gynecology;  Laterality: N/A;   TUBAL LIGATION      Family History  Problem Relation Age of Onset   Breast cancer Mother 92   Breast cancer Paternal Grandmother    Heart failure Neg Hx    Coronary artery disease Neg Hx     Social History:  reports that she quit smoking about 28 years ago. Her smoking use included cigarettes. She started smoking about 38 years ago. She has a 2.5 pack-year smoking history. She has never used smokeless tobacco. She reports that she does not drink alcohol and does not use drugs.  Allergies:  Allergies  Allergen Reactions   Codeine Itching and Rash    Medications: Scheduled: Continuous:  sodium chloride      sodium chloride  500 mL (05/08/24 0704)    Results for orders placed or performed during the hospital encounter of 05/08/24 (from  the past 24 hours)  Glucose, capillary     Status: None   Collection Time: 05/08/24  7:02 AM  Result Value Ref Range   Glucose-Capillary 88 70 - 99 mg/dL     CT ABDOMEN PELVIS W CONTRAST Result Date: 05/06/2024 CLINICAL DATA:  Acute epigastric abdominal pain. EXAM: CT ABDOMEN AND PELVIS WITH CONTRAST TECHNIQUE: Multidetector CT imaging of the abdomen and pelvis was performed using the standard protocol following bolus administration of intravenous contrast. RADIATION DOSE REDUCTION:  This exam was performed according to the departmental dose-optimization program which includes automated exposure control, adjustment of the mA and/or kV according to patient size and/or use of iterative reconstruction technique. CONTRAST:  OMNIPAQUE  IOHEXOL  300 MG/ML  SOLN COMPARISON:  April 15, 2021. FINDINGS: Lower chest: No acute abnormality. Hepatobiliary: Minimal cholelithiasis. No biliary dilatation. Liver is unremarkable. Pancreas: Unremarkable. No pancreatic ductal dilatation or surrounding inflammatory changes. Spleen: Normal in size without focal abnormality. Adrenals/Urinary Tract: Adrenal glands are unremarkable. Kidneys are normal, without renal calculi, focal lesion, or hydronephrosis. Bladder is unremarkable. Stomach/Bowel: Stomach is within normal limits. Appendix appears normal. No evidence of bowel wall thickening, distention, or inflammatory changes. Vascular/Lymphatic: No significant vascular findings are present. No enlarged abdominal or pelvic lymph nodes. Reproductive: Status post hysterectomy. No adnexal masses. Other: No abdominal wall hernia or abnormality. No abdominopelvic ascites. Musculoskeletal: No acute or significant osseous findings. IMPRESSION: 1. Minimal cholelithiasis. No definite evidence of cholecystitis. 2. No other abnormality seen in the abdomen or pelvis. Electronically Signed   By: Lynwood Landy Raddle M.D.   On: 05/06/2024 15:35    ROS:  As stated above in the HPI otherwise negative.  Blood pressure (!) 145/82, pulse 72, temperature 97.8 F (36.6 C), temperature source Temporal, resp. rate 16, height 5' 5 (1.651 m), weight 86.2 kg, last menstrual period 08/04/2011, SpO2 97%.    PE: Gen: NAD, Alert and Oriented HEENT:  Lisa Casey/AT, EOMI Neck: Supple, no LAD Lungs: CTA Bilaterally CV: RRR without M/G/R ABD: Soft, NTND, +BS Ext: No C/C/E  Assessment/Plan: 1) Epigastric pain. 2) RUQ pain.  Plan: 1) EUS.  Lisa Casey D 05/08/2024, 7:15 AM

## 2024-05-08 NOTE — Transfer of Care (Signed)
 Immediate Anesthesia Transfer of Care Note  Patient: Lisa Casey  Procedure(s) Performed: ULTRASOUND, UPPER GI TRACT, ENDOSCOPIC EGD (ESOPHAGOGASTRODUODENOSCOPY)  Patient Location: PACU  Anesthesia Type:MAC  Level of Consciousness: awake and alert   Airway & Oxygen Therapy: Patient Spontanous Breathing and Patient connected to nasal cannula oxygen  Post-op Assessment: Report given to RN and Post -op Vital signs reviewed and stable  Post vital signs: Reviewed and stable  Last Vitals:  Vitals Value Taken Time  BP 117/65 05/08/24 07:58  Temp    Pulse 79 05/08/24 07:58  Resp 18 05/08/24 07:58  SpO2 93 % 05/08/24 07:58  Vitals shown include unfiled device data.  Last Pain:  Vitals:   05/08/24 0648  TempSrc: Temporal  PainSc: 0-No pain         Complications: No notable events documented.

## 2024-05-08 NOTE — Discharge Instructions (Addendum)
YOU HAD AN ENDOSCOPIC PROCEDURE TODAY: Refer to the procedure report and other information in the discharge instructions given to you for any specific questions about what was found during the examination. If this information does not answer your questions, please call [facility] at [phone] to clarify.  ? ?YOU SHOULD EXPECT: Some feelings of bloating in the abdomen. Passage of more gas than usual. Walking can help get rid of the air that was put into your GI tract during the procedure and reduce the bloating. ? ?DIET: Your first meal following the procedure should be a light meal and then it is ok to progress to your normal diet. A half-sandwich or bowl of soup is an example of a good first meal. Heavy or fried foods are harder to digest and may make you feel nauseous or bloated. Drink plenty of fluids but you should avoid alcoholic beverages for 24 hours.  ? ?ACTIVITY: Your care partner should take you home directly after the procedure. You should plan to take it easy, moving slowly for the rest of the day. You can resume normal activity the day after the procedure however YOU SHOULD NOT DRIVE, use power tools, machinery or perform tasks that involve climbing or major physical exertion for 24 hours (because of the sedation medicines used during the test).  ? ?SYMPTOMS TO REPORT IMMEDIATELY: ?A gastroenterologist can be reached at any hour. Please call [phone]  for any of the following symptoms:  ? ?Following upper endoscopy (EGD, EUS, ERCP, esophageal dilation) ?Vomiting of blood or coffee ground material  ?New, significant abdominal pain  ?New, significant chest pain or pain under the shoulder blades  ?Painful or persistently difficult swallowing  ?New shortness of breath  ?Black, tarry-looking or red, bloody stools ? ?FOLLOW UP:  ?If any biopsies were taken you will be contacted by phone or by letter within the next 1-3 weeks. Call [phone]  if you have not heard about the biopsies in 3  weeks.  ?Please also call with any specific questions about appointments or follow up tests.  ?

## 2024-05-08 NOTE — Op Note (Signed)
 [facility] Patient Name: Lisa Casey Procedure Date: 05/08/2024 MRN: [id number] Attending MD: Belvie Just , MD, [id number] Date of Birth: October 08, 1960 CSN: [id number] Age: 64 Admit Type: Outpatient Procedure:                Upper EUS Indications:              Epigastric abdominal pain, Abdominal pain in the                            right upper quadrant Providers:                Belvie Just, MD, Ozell Pouch, Curtistine Bishop, Technician Referring MD:              Medicines:                Propofol  per Anesthesia Complications:            No immediate complications. Estimated Blood Loss:     Estimated blood loss: none. Procedure:                Pre-Anesthesia Assessment:                           - Prior to the procedure, a History and Physical                            was performed, and patient medications and                            allergies were reviewed. The patient's tolerance of                            previous anesthesia was also reviewed. The risks                            and benefits of the procedure and the sedation                            options and risks were discussed with the patient.                            All questions were answered, and informed consent                            was obtained. Prior Anticoagulants: The patient has                            taken no anticoagulant or antiplatelet agents. ASA                            Grade Assessment: II - A patient with mild systemic  disease. After reviewing the risks and benefits,                            the patient was deemed in satisfactory condition to                            undergo the procedure.                           - Sedation was administered by an anesthesia                            professional. Deep sedation was attained.                           After obtaining informed consent, the endoscope  was                            passed under direct vision. Throughout the                            procedure, the patient's blood pressure, pulse, and                            oxygen saturations were monitored continuously. The                            [id number] ([id number] Olympus endosonoscope was                            introduced through the mouth, and advanced to the                            second part of duodenum. The upper EUS was                            accomplished without difficulty. The patient                            tolerated the procedure well. Scope In: Scope Out: Findings:      ENDOSONOGRAPHIC FINDING: :      Many stones were visualized endosonographically in the gallbladder neck       and in the gallbladder body. The stones were oval. They were hyperechoic       and characterized by shadowing.      The only notable abnormality was the cholelithiasis. There was no       evidence of any biliary ductal dilation or sludge in the CBD. The       caliber of the CBD was normal. Impression:               - Many stones were visualized endosonographically                            in the gallbladder neck and in the gallbladder body.                           -  No specimens collected. Moderate Sedation:      Not Applicable - Patient had care per Anesthesia. Recommendation:           - Patient has a contact number available for                            emergencies. The signs and symptoms of potential                            delayed complications were discussed with the                            patient. Return to normal activities tomorrow.                            Written discharge instructions were provided to the                            patient.                           - Resume regular diet.                           - Referral to Surgery for symptomatic gallstones. Procedure Code(s):        --- Professional ---                           [phone],  Esophagogastroduodenoscopy, flexible,                            transoral; with endoscopic ultrasound examination                            limited to the esophagus, stomach or duodenum, and                            adjacent structures Diagnosis Code(s):        --- Professional ---                           K80.20, Calculus of gallbladder without                            cholecystitis without obstruction                           R10.13, Epigastric pain                           R10.11, Right upper quadrant pain CPT copyright 2022 [employer]. All rights reserved. The codes documented in this report are preliminary and upon coder review may  be revised to meet current compliance requirements. Belvie Just, MD Belvie Just, MD 05/08/2024 8:03:01 AM This report has been signed electronically. Number of Addenda: 0

## 2024-05-08 NOTE — Anesthesia Postprocedure Evaluation (Signed)
 Anesthesia Post Note  Patient: Lisa Casey  Procedure(s) Performed: ULTRASOUND, UPPER GI TRACT, ENDOSCOPIC EGD (ESOPHAGOGASTRODUODENOSCOPY)     Patient location during evaluation: PACU Anesthesia Type: MAC Level of consciousness: awake Pain management: pain level controlled Vital Signs Assessment: post-procedure vital signs reviewed and stable Respiratory status: spontaneous breathing, nonlabored ventilation and respiratory function stable Cardiovascular status: stable and blood pressure returned to baseline Postop Assessment: no apparent nausea or vomiting Anesthetic complications: no   No notable events documented.  Last Vitals:  Vitals:   05/08/24 0810 05/08/24 0820  BP: 120/65 126/68  Pulse: 64 63  Resp: 15 (!) 23  Temp:    SpO2: 93% 94%    Last Pain:  Vitals:   05/08/24 0820  TempSrc:   PainSc: 0-No pain                 Delon Aisha Arch

## 2024-05-10 ENCOUNTER — Encounter (HOSPITAL_COMMUNITY): Payer: Self-pay | Admitting: Gastroenterology

## 2024-05-13 ENCOUNTER — Ambulatory Visit: Payer: Self-pay | Admitting: General Surgery

## 2024-05-18 ENCOUNTER — Other Ambulatory Visit: Payer: Self-pay | Admitting: Nurse Practitioner

## 2024-05-22 ENCOUNTER — Encounter: Payer: Self-pay | Admitting: Nurse Practitioner

## 2024-05-22 ENCOUNTER — Other Ambulatory Visit: Payer: Self-pay | Admitting: Nurse Practitioner

## 2024-05-22 DIAGNOSIS — R0982 Postnasal drip: Secondary | ICD-10-CM

## 2024-05-27 NOTE — Progress Notes (Signed)
 PCP - Gaines Ada, NP Cardiologist - previous clearance for endoscopy procedure 05-08-24 Orren Fabry, PA-C   Primary cardiologist Soyla Merck, MD assigned in hospital now with Wilbert Bihari, MD LOV 11-20-23 epic  PPM/ICD -  Device Orders -  Rep Notified -   Chest x-ray - 08-01-23 epic EKG - 05-08-24 epic Stress Test -  ECHO - 04-15-21 epic Cardiac Cath - 04-17-21 epic MR cardiac- 04-17-21 epic  Sleep Study -  CPAP -   Fasting Blood Sugar - no Checks Blood Sugar _no_ times a day  Blood Thinner Instructions:n/a Aspirin  Instructions:n/a  ERAS Protcol -y PRE-SURGERY - no drink   COVID vaccine -yes  Activity--Able to climb a flight of stairs with no CP or SOB  Anesthesia review: NSTEMI 2022, HTN, Documented DM pt. Denies and does not take medication. PHENTERMINE- none in over a month per pt.  Patient denies shortness of breath, fever, cough and chest pain at PAT appointment   All instructions explained to the patient, with a verbal understanding of the material. Patient agrees to go over the instructions while at home for a better understanding. Patient also instructed to self quarantine after being tested for COVID-19. The opportunity to ask questions was provided.

## 2024-05-27 NOTE — Patient Instructions (Signed)
 SURGICAL WAITING ROOM VISITATION  Patients having surgery or a procedure may have no more than 2 support people in the waiting area - these visitors may rotate.    Children under the age of 57 must have an adult with them who is not the patient.  Visitors with respiratory illnesses are discouraged from visiting and should remain at home.  If the patient needs to stay at the hospital during part of their recovery, the visitor guidelines for inpatient rooms apply. Pre-op nurse will coordinate an appropriate time for 1 support person to accompany patient in pre-op.  This support person may not rotate.    Please refer to the [facility] website for the visitor guidelines for Inpatients (after your surgery is over and you are in a regular room).       Your procedure is scheduled on: 05-29-24   Report to [facility] Main Entrance    Report to admitting at       0515  AM   Call this number if you have problems the morning of surgery [phone]   Do not eat food :After Midnight.   After Midnight you may have the following liquids until __0430____ AM/ DAY OF SURGERY  then nothing by mouth  Water Non-Citrus Juices (without pulp, NO RED-Apple, White grape, White cranberry) Black Coffee (NO MILK/CREAM OR CREAMERS, sugar ok)  Clear Tea (NO MILK/CREAM OR CREAMERS, sugar ok) regular and decaf                             Plain Jell-O (NO RED)                                           Fruit ices (not with fruit pulp, NO RED)                                     Popsicles (NO RED)                                                               Sports drinks like Gatorade (NO RED)                          If you have questions, please contact your surgeon's office.   FOLLOW  ANY ADDITIONAL PRE OP INSTRUCTIONS YOU RECEIVED FROM YOUR SURGEON'S OFFICE!!!     Oral Hygiene is also important to reduce your risk of infection.                                    Remember - BRUSH YOUR TEETH  THE MORNING OF SURGERY WITH YOUR REGULAR TOOTHPASTE  DENTURES WILL BE REMOVED PRIOR TO SURGERY PLEASE DO NOT APPLY Poly grip OR ADHESIVES!!!   Do NOT smoke after Midnight   Stop all vitamins and herbal supplements 7 days before surgery.   Take these medicines the morning of surgery with A SIP OF WATER: Venlafaxine  (effexor ), lyrica ,  omeprazole, amlodipine , metoprolol , zyrtec  if needed , Hydroxyzine  if needed  DO NOT TAKE ANY ORAL DIABETIC MEDICATIONS DAY OF YOUR SURGERY  Bring CPAP mask and tubing day of surgery.                              You may not have any metal on your body including hair pins, jewelry, and body piercing             Do not wear make-up, lotions, powders, perfumes/cologne, or deodorant  Do not wear nail polish including gel and S&S, artificial/acrylic nails, or any other type of covering on natural nails including finger and toenails. If you have artificial nails, gel coating, etc. that needs to be removed by a nail salon please have this removed prior to surgery or surgery may need to be canceled/ delayed if the surgeon/ anesthesia feels like they are unable to be safely monitored.   Do not shave  48 hours prior to surgery.      Do not bring valuables to the hospital. [facility] IS NOT             RESPONSIBLE   FOR VALUABLES.   Contacts, glasses, dentures or bridgework may not be worn into surgery.   Bring small overnight bag day of surgery.   DO NOT BRING YOUR HOME MEDICATIONS TO THE HOSPITAL. PHARMACY WILL DISPENSE MEDICATIONS LISTED ON YOUR MEDICATION LIST TO YOU DURING YOUR ADMISSION IN THE HOSPITAL!    Patients discharged on the day of surgery will not be allowed to drive home.  Someone NEEDS to stay with you for the first 24 hours after anesthesia.   Special Instructions: Bring a copy of your healthcare power of attorney and living will documents the day of surgery if you haven't scanned them before.              Please read over the following  fact sheets you were given: IF YOU HAVE QUESTIONS ABOUT YOUR PRE-OP INSTRUCTIONS PLEASE CALL [phone].    If you test positive for Covid or have been in contact with anyone that has tested positive in the last 10 days please notify you surgeon.    [facility] - Preparing for Surgery Before surgery, you can play an important role.  Because skin is not sterile, your skin needs to be as free of germs as possible.  You can reduce the number of germs on your skin by washing with CHG (chlorahexidine gluconate) soap before surgery.  CHG is an antiseptic cleaner which kills germs and bonds with the skin to continue killing germs even after washing. Please DO NOT use if you have an allergy to CHG or antibacterial soaps.  If your skin becomes reddened/irritated stop using the CHG and inform your nurse when you arrive at Short Stay. Do not shave (including legs and underarms) for at least 48 hours prior to the first CHG shower.  You may shave your face/neck. Please follow these instructions carefully:  1.  Shower with CHG Soap the night before surgery and the  morning of Surgery.  2.  If you choose to wash your hair, wash your hair first as usual with your  normal  shampoo.  3.  After you shampoo, rinse your hair and body thoroughly to remove the  shampoo.  4.  Use CHG as you would any other liquid soap.  You can apply chg directly  to the skin and wash                       Gently with a scrungie or clean washcloth.  5.  Apply the CHG Soap to your body ONLY FROM THE NECK DOWN.   Do not use on face/ open                           Wound or open sores. Avoid contact with eyes, ears mouth and genitals (private parts).                       Wash face,  Genitals (private parts) with your normal soap.             6.  Wash thoroughly, paying special attention to the area where your surgery  will be performed.  7.  Thoroughly rinse your body with warm water from the neck down.  8.  DO NOT  shower/wash with your normal soap after using and rinsing off  the CHG Soap.                9.  Pat yourself dry with a clean towel.            10.  Wear clean pajamas.            11.  Place clean sheets on your bed the night of your first shower and do not  sleep with pets. Day of Surgery : Do not apply any lotions/deodorants the morning of surgery.  Please wear clean clothes to the hospital/surgery center.  FAILURE TO FOLLOW THESE INSTRUCTIONS MAY RESULT IN THE CANCELLATION OF YOUR SURGERY PATIENT SIGNATURE_________________________________  NURSE SIGNATURE__________________________________  ________________________________________________________________________

## 2024-05-28 ENCOUNTER — Encounter (HOSPITAL_COMMUNITY): Payer: Self-pay | Admitting: General Surgery

## 2024-05-28 ENCOUNTER — Encounter (HOSPITAL_COMMUNITY)
Admission: RE | Admit: 2024-05-28 | Discharge: 2024-05-28 | Disposition: A | Discharge status: Home / Self Care | Source: Ambulatory Visit | Attending: General Surgery | Admitting: General Surgery

## 2024-05-28 ENCOUNTER — Other Ambulatory Visit: Payer: Self-pay

## 2024-05-28 ENCOUNTER — Encounter (HOSPITAL_COMMUNITY): Payer: Self-pay

## 2024-05-28 VITALS — BP 144/86 | HR 71 | Temp 98.4°F | Resp 16 | Ht 65.0 in | Wt 188.6 lb

## 2024-05-28 DIAGNOSIS — I10 Essential (primary) hypertension: Secondary | ICD-10-CM | POA: Diagnosis not present

## 2024-05-28 DIAGNOSIS — Z7984 Long term (current) use of oral hypoglycemic drugs: Secondary | ICD-10-CM | POA: Diagnosis not present

## 2024-05-28 DIAGNOSIS — Z87891 Personal history of nicotine dependence: Secondary | ICD-10-CM | POA: Insufficient documentation

## 2024-05-28 DIAGNOSIS — M797 Fibromyalgia: Secondary | ICD-10-CM | POA: Insufficient documentation

## 2024-05-28 DIAGNOSIS — I252 Old myocardial infarction: Secondary | ICD-10-CM | POA: Diagnosis not present

## 2024-05-28 DIAGNOSIS — E119 Type 2 diabetes mellitus without complications: Secondary | ICD-10-CM | POA: Insufficient documentation

## 2024-05-28 DIAGNOSIS — K8010 Calculus of gallbladder with chronic cholecystitis without obstruction: Secondary | ICD-10-CM | POA: Insufficient documentation

## 2024-05-28 DIAGNOSIS — Z01812 Encounter for preprocedural laboratory examination: Secondary | ICD-10-CM | POA: Insufficient documentation

## 2024-05-28 HISTORY — DX: Depression, unspecified: F32.A

## 2024-05-28 LAB — HEMOGLOBIN A1C
Hgb A1c MFr Bld: 5.8 % — ABNORMAL HIGH (ref 4.8–5.6)
Mean Plasma Glucose: 119.76 mg/dL

## 2024-05-28 LAB — GLUCOSE, CAPILLARY: Glucose-Capillary: 103 mg/dL — ABNORMAL HIGH (ref 70–99)

## 2024-05-28 NOTE — Progress Notes (Signed)
 Anesthesia Chart Review   Case: [id number] Date/Time: 05/29/24 0715   Procedure: LAPAROSCOPIC CHOLECYSTECTOMY   Anesthesia type: General   Diagnosis: Calculus of gallbladder with chronic cholecystitis without obstruction [K80.10]   Pre-op diagnosis: CHRONIC CALCULUS CHOLE   Location[facility] ROOM 04 / [facility]   Surgeons: Kinsinger, Herlene Righter, MD       DISCUSSION:63 y.o. former smoker with h/o HTN, fibromyalgia, CAD NSTEMI 7/22, chronic cholecystitis scheduled for above procedure 05/29/24 with Dr. Herlene Bureau.   Cardiac cath 8/122 with no evidence of CAD. Pr last seen by cardiology 04/03/2024. Per OV note HTN well controlled.  Right carotid bruit heard, carotid US  with no stenosis.   Pt hasn't taken Phentermine in over a month.  VS: BP (!) 144/86   Pulse 71   Temp 36.9 C (Oral)   Resp 16   Ht 5' 5 (1.651 m)   Wt 85.5 kg   LMP 08/04/2011   SpO2 98%   BMI 31.38 kg/m   PROVIDERS: Georgina Speaks, FNP is PCP   Primary Cardiologist: Soyla DELENA Merck, MD  LABS: Labs reviewed: Acceptable for surgery. (all labs ordered are listed, but only abnormal results are displayed)  Labs Reviewed  GLUCOSE, CAPILLARY - Abnormal; Notable for the following components:      Result Value   Glucose-Capillary 103 (*)    All other components within normal limits  HEMOGLOBIN A1C     IMAGES:   EKG:   CV: Cardiac Cath 04/17/2021 No angiographic evidence of CAD LVEDP 15-17 mmHg   Recommendations: No further ischemic testing. Consider coronary vasospasm as the etiology of her chest pain and elevated troponin.   Echo 04/15/2021 1. Left ventricular ejection fraction, by estimation, is 65 to 70%. The  left ventricle has normal function. The left ventricle has no regional  wall motion abnormalities. Left ventricular diastolic parameters were  normal.   2. Right ventricular systolic function is normal. The right ventricular  size is normal. There is normal pulmonary artery systolic pressure.   3.  The mitral valve is normal in structure. Trivial mitral valve  regurgitation. No evidence of mitral stenosis.   4. The aortic valve is tricuspid. Aortic valve regurgitation is not  visualized. No aortic stenosis is present.   5. The inferior vena cava is normal in size with greater than 50%  respiratory variability, suggesting right atrial pressure of 3 mmHg.  Past Medical History:  Diagnosis Date   Abdominal pain 05/26/2022   Anxiety    HX OF ANXIETY ATTACKS   Back pain, chronic    Bronchitis    Cholelithiasis 04/15/2021   Depression    Diabetes (HCC)    pt. state pre-DM highest HgbA1c  6.4   Fibromyalgia    GERD (gastroesophageal reflux disease)    H/O total hysterectomy 07/05/2022   Cervix removed per patient   History of kidney stones    Hypertension    Migraines    HX OF MIGRAINES   Myocardial infarction [facility])    july 2022   Obesity    Pneumonia    06-2021   Shortened PR interval     Past Surgical History:  Procedure Laterality Date   CYSTOSCOPY W/ URETERAL STENT PLACEMENT  09/23/2012   Procedure: CYSTOSCOPY WITH RETROGRADE PYELOGRAM/URETERAL STENT PLACEMENT;  Surgeon: Ricardo Likens, MD;  Location[facility];  Service: Urology;  Laterality: Right;   CYSTOSCOPY WITH RETROGRADE PYELOGRAM, URETEROSCOPY AND STENT PLACEMENT  10/08/2012   Procedure: CYSTOSCOPY WITH RETROGRADE PYELOGRAM, URETEROSCOPY AND STENT PLACEMENT;  Surgeon: Ricardo Likens, MD;  Location: [facility];  Service: Urology;  Laterality: Right;  90 MIN NO RETROGRADE NEEDS FLEX URETEROSCOPE    CYSTOSCOPY/URETEROSCOPY/HOLMIUM LASER/STENT PLACEMENT Right 08/21/2021   Procedure: CYSTOSCOPY RIGHT /URETEROSCOPY/HOLMIUM LASER/STENT PLACEMENT;  Surgeon: Carolee Sherwood JONETTA DOUGLAS, MD;  [facility];  Service: Urology;  Laterality: Right;   ESOPHAGOGASTRODUODENOSCOPY N/A 05/08/2024   Procedure: EGD (ESOPHAGOGASTRODUODENOSCOPY);  Surgeon: Rollin Dover, MD;  Location: [facility];  Service:  Gastroenterology;  Laterality: N/A;   EUS N/A 05/08/2024   Procedure: ULTRASOUND, UPPER GI TRACT, ENDOSCOPIC;  Surgeon: Rollin Dover, MD;  [facility];  Service: Gastroenterology;  Laterality: N/A;   HOLMIUM LASER APPLICATION  10/08/2012   Procedure: HOLMIUM LASER APPLICATION;  Surgeon: Ricardo Likens, MD;  Location: [facility];  Service: Urology;  Laterality: Right;   LEFT HEART CATH AND CORONARY ANGIOGRAPHY N/A 04/17/2021   Procedure: LEFT HEART CATH AND CORONARY ANGIOGRAPHY;  Surgeon: Verlin Lonni JONETTA, MD;  Location: [facility];  Service: Cardiovascular;  Laterality: N/A;   LEFT HEART CATHETERIZATION WITH CORONARY ANGIOGRAM N/A 10/29/2011   Procedure: LEFT HEART CATHETERIZATION WITH CORONARY ANGIOGRAM;  Surgeon: Alm LELON Clay, MD;  Location: [facility] CATH LAB;  Service: Cardiovascular;  Laterality: N/A;   ROBOTIC ASSISTED LAP VAGINAL HYSTERECTOMY  09/14/2011   Procedure: ROBOTIC ASSISTED LAPAROSCOPIC VAGINAL HYSTERECTOMY;  Surgeon: Olam DELENA Mill, MD;  [facility];  Service: Gynecology;  Laterality: N/A;   TUBAL LIGATION      MEDICATIONS:  acetaminophen  (TYLENOL ) 500 MG tablet   albuterol  (VENTOLIN  HFA) 108 (90 Base) MCG/ACT inhaler   amLODipine  (NORVASC ) 10 MG tablet   Azelastine  HCl 0.15 % SOLN   eszopiclone  (LUNESTA ) 1 MG TABS tablet   glucose blood (ONETOUCH VERIO) test strip   hydrOXYzine  (ATARAX ) 25 MG tablet   isosorbide  mononitrate (IMDUR ) 30 MG 24 hr tablet   levocetirizine (XYZAL ) 5 MG tablet   linaclotide  (LINZESS ) 72 MCG capsule   meloxicam  (MOBIC ) 15 MG tablet   metFORMIN  (GLUCOPHAGE -XR) 750 MG 24 hr tablet   nitroGLYCERIN  (NITROSTAT ) 0.4 MG SL tablet   ondansetron  (ZOFRAN -ODT) 8 MG disintegrating tablet   OneTouch Delica Lancets 33G MISC   oxyCODONE -acetaminophen  (PERCOCET/ROXICET) 5-325 MG tablet   phentermine (ADIPEX-P) 37.5 MG tablet   pregabalin  (LYRICA ) 225 MG capsule   venlafaxine  (EFFEXOR ) 25 MG tablet   No  current facility-administered medications for this encounter.      Lisa Casey Ward, PA-C [facility] [phone]

## 2024-05-28 NOTE — Anesthesia Preprocedure Evaluation (Signed)
 Anesthesia Evaluation  Patient identified by MRN, date of birth, ID band Patient awake    Reviewed: Allergy & Precautions, NPO status , Patient's Chart, lab work & pertinent test results  History of Anesthesia Complications Negative for: history of anesthetic complications  Airway Mallampati: II  TM Distance: >3 FB Neck ROM: Full    Dental  (+) Missing, Dental Advisory Given   Pulmonary sleep apnea , former smoker   breath sounds clear to auscultation       Cardiovascular hypertension, Pt. on medications (-) angina (-) CAD  Rhythm:Regular Rate:Normal  '22 ECHO: EF 65-70%, normal LVF, normal RVF, no significant valvular abnormalities   Neuro/Psych  Headaches  Anxiety Depression       GI/Hepatic Neg liver ROS,GERD  Controlled,,  Endo/Other  diabetes (glu 100), Oral Hypoglycemic Agents  BMI 31  Renal/GU negative Renal ROS     Musculoskeletal  (+)  Fibromyalgia -  Abdominal   Peds  Hematology negative hematology ROS (+)   Anesthesia Other Findings   Reproductive/Obstetrics                              Anesthesia Physical Anesthesia Plan  ASA: 3  Anesthesia Plan: General   Post-op Pain Management: Tylenol  PO (pre-op)*   Induction: Intravenous  PONV Risk Score and Plan: 3 and Ondansetron , Dexamethasone  and Scopolamine  patch - Pre-op  Airway Management Planned: Oral ETT  Additional Equipment: None  Intra-op Plan:   Post-operative Plan: Extubation in OR  Informed Consent: I have reviewed the patients History and Physical, chart, labs and discussed the procedure including the risks, benefits and alternatives for the proposed anesthesia with the patient or authorized representative who has indicated his/her understanding and acceptance.     Dental advisory given  Plan Discussed with: CRNA and Surgeon  Anesthesia Plan Comments:          Anesthesia Quick Evaluation

## 2024-05-29 ENCOUNTER — Ambulatory Visit (HOSPITAL_COMMUNITY)
Admission: RE | Admit: 2024-05-29 | Discharge: 2024-05-29 | Disposition: A | Discharge status: Home / Self Care | Attending: General Surgery | Admitting: General Surgery

## 2024-05-29 ENCOUNTER — Ambulatory Visit (HOSPITAL_COMMUNITY): Payer: Self-pay | Admitting: Certified Registered Nurse Anesthetist

## 2024-05-29 ENCOUNTER — Encounter (HOSPITAL_COMMUNITY): Payer: Self-pay | Admitting: General Surgery

## 2024-05-29 ENCOUNTER — Encounter (HOSPITAL_COMMUNITY)
Admission: RE | Disposition: A | Discharge status: Home / Self Care | Payer: Self-pay | Source: Home / Self Care | Attending: General Surgery

## 2024-05-29 ENCOUNTER — Other Ambulatory Visit: Payer: Self-pay

## 2024-05-29 ENCOUNTER — Ambulatory Visit (HOSPITAL_BASED_OUTPATIENT_CLINIC_OR_DEPARTMENT_OTHER): Payer: Self-pay | Admitting: Certified Registered Nurse Anesthetist

## 2024-05-29 DIAGNOSIS — F418 Other specified anxiety disorders: Secondary | ICD-10-CM | POA: Diagnosis not present

## 2024-05-29 DIAGNOSIS — Z87891 Personal history of nicotine dependence: Secondary | ICD-10-CM

## 2024-05-29 DIAGNOSIS — I10 Essential (primary) hypertension: Secondary | ICD-10-CM

## 2024-05-29 DIAGNOSIS — E119 Type 2 diabetes mellitus without complications: Secondary | ICD-10-CM | POA: Insufficient documentation

## 2024-05-29 DIAGNOSIS — K589 Irritable bowel syndrome without diarrhea: Secondary | ICD-10-CM | POA: Diagnosis not present

## 2024-05-29 DIAGNOSIS — K219 Gastro-esophageal reflux disease without esophagitis: Secondary | ICD-10-CM | POA: Diagnosis not present

## 2024-05-29 DIAGNOSIS — Z7984 Long term (current) use of oral hypoglycemic drugs: Secondary | ICD-10-CM | POA: Insufficient documentation

## 2024-05-29 DIAGNOSIS — G4730 Sleep apnea, unspecified: Secondary | ICD-10-CM | POA: Diagnosis not present

## 2024-05-29 DIAGNOSIS — Z79899 Other long term (current) drug therapy: Secondary | ICD-10-CM | POA: Insufficient documentation

## 2024-05-29 DIAGNOSIS — K8010 Calculus of gallbladder with chronic cholecystitis without obstruction: Secondary | ICD-10-CM

## 2024-05-29 DIAGNOSIS — M797 Fibromyalgia: Secondary | ICD-10-CM | POA: Diagnosis not present

## 2024-05-29 HISTORY — PX: CHOLECYSTECTOMY: SHX55

## 2024-05-29 LAB — GLUCOSE, CAPILLARY
Glucose-Capillary: 100 mg/dL — ABNORMAL HIGH (ref 70–99)
Glucose-Capillary: 157 mg/dL — ABNORMAL HIGH (ref 70–99)

## 2024-05-29 SURGERY — LAPAROSCOPIC CHOLECYSTECTOMY
Anesthesia: General

## 2024-05-29 MED ORDER — 0.9 % SODIUM CHLORIDE (POUR BTL) OPTIME
TOPICAL | Status: DC | PRN
  Administered 2024-05-29: 1000 mL

## 2024-05-29 MED ORDER — FENTANYL CITRATE (PF) 100 MCG/2ML IJ SOLN
INTRAMUSCULAR | Status: AC
  Filled 2024-05-29: qty 2

## 2024-05-29 MED ORDER — LACTATED RINGERS IR SOLN
Status: DC | PRN
  Administered 2024-05-29: 1000 mL

## 2024-05-29 MED ORDER — CHLORHEXIDINE GLUCONATE 0.12 % MT SOLN: 15.0000 mL | Freq: Once | OROMUCOSAL | Status: DC

## 2024-05-29 MED ORDER — BUPIVACAINE-EPINEPHRINE (PF) 0.25% -1:200000 IJ SOLN
INTRAMUSCULAR | Status: AC
  Filled 2024-05-29: qty 30

## 2024-05-29 MED ORDER — ONDANSETRON HCL 4 MG/2ML IJ SOLN
INTRAMUSCULAR | Status: DC | PRN
Start: 2024-05-29 — End: 2024-05-29
  Administered 2024-05-29: 4 mg via INTRAVENOUS

## 2024-05-29 MED ORDER — PROPOFOL 10 MG/ML IV BOLUS
INTRAVENOUS | Status: AC
  Filled 2024-05-29: qty 20

## 2024-05-29 MED ORDER — MIDAZOLAM HCL 2 MG/2ML IJ SOLN
INTRAMUSCULAR | Status: AC
  Filled 2024-05-29: qty 2

## 2024-05-29 MED ORDER — MIDAZOLAM HCL 2 MG/2ML IJ SOLN
INTRAMUSCULAR | Status: DC | PRN
  Administered 2024-05-29: 1 mg via INTRAVENOUS

## 2024-05-29 MED ORDER — PROPOFOL 10 MG/ML IV BOLUS
INTRAVENOUS | Status: DC | PRN
Start: 2024-05-29 — End: 2024-05-29
  Administered 2024-05-29: 120 mg via INTRAVENOUS

## 2024-05-29 MED ORDER — ORAL CARE MOUTH RINSE: 15.0000 mL | Freq: Once | OROMUCOSAL | Status: DC

## 2024-05-29 MED ORDER — GABAPENTIN 300 MG PO CAPS
300.0000 mg | ORAL_CAPSULE | ORAL | Status: DC
Start: 2024-05-29 — End: 2024-05-29
  Filled 2024-05-29: qty 1

## 2024-05-29 MED ORDER — MEPERIDINE HCL 100 MG/ML IJ SOLN: 6.2500 mg | INTRAMUSCULAR | Status: DC | PRN

## 2024-05-29 MED ORDER — CHLORHEXIDINE GLUCONATE CLOTH 2 % EX PADS
6.0000 | MEDICATED_PAD | Freq: Once | CUTANEOUS | Status: DC
Start: 2024-05-29 — End: 2024-05-29

## 2024-05-29 MED ORDER — SCOPOLAMINE 1 MG/3DAYS TD PT72
1.0000 | MEDICATED_PATCH | TRANSDERMAL | Status: DC
  Administered 2024-05-29: 1 mg via TRANSDERMAL
  Filled 2024-05-29: qty 1

## 2024-05-29 MED ORDER — INDOCYANINE GREEN 25 MG IV SOLR
2.5000 mg | Freq: Once | INTRAVENOUS | Status: AC
Start: 2024-05-29 — End: 2024-05-29
  Administered 2024-05-29: 2.5 mg via INTRAVENOUS
  Filled 2024-05-29: qty 10

## 2024-05-29 MED ORDER — CELECOXIB 200 MG PO CAPS
400.0000 mg | ORAL_CAPSULE | ORAL | Status: AC
  Administered 2024-05-29: 400 mg via ORAL
  Filled 2024-05-29: qty 2

## 2024-05-29 MED ORDER — HYDROMORPHONE HCL 1 MG/ML IJ SOLN
0.2500 mg | INTRAMUSCULAR | Status: DC | PRN
  Administered 2024-05-29: 0.5 mg via INTRAVENOUS

## 2024-05-29 MED ORDER — ENSURE PRE-SURGERY PO LIQD: 296.0000 mL | Freq: Once | ORAL | Status: DC

## 2024-05-29 MED ORDER — DEXAMETHASONE SODIUM PHOSPHATE 10 MG/ML IJ SOLN
INTRAMUSCULAR | Status: AC
  Filled 2024-05-29: qty 1

## 2024-05-29 MED ORDER — OXYCODONE HCL 5 MG/5ML PO SOLN: 5.0000 mg | Freq: Once | ORAL | Status: DC | PRN

## 2024-05-29 MED ORDER — OXYCODONE HCL 5 MG PO TABS: 5.0000 mg | ORAL_TABLET | Freq: Once | ORAL | Status: DC | PRN

## 2024-05-29 MED ORDER — SUGAMMADEX SODIUM 200 MG/2ML IV SOLN
INTRAVENOUS | Status: DC | PRN
  Administered 2024-05-29: 200 mg via INTRAVENOUS

## 2024-05-29 MED ORDER — IBUPROFEN 800 MG PO TABS: 800.0000 mg | ORAL_TABLET | Freq: Three times a day (TID) | ORAL | 0 refills | Status: DC | PRN

## 2024-05-29 MED ORDER — BUPIVACAINE-EPINEPHRINE 0.25% -1:200000 IJ SOLN
INTRAMUSCULAR | Status: DC | PRN
  Administered 2024-05-29: 30 mL

## 2024-05-29 MED ORDER — PHENYLEPHRINE 80 MCG/ML (10ML) SYRINGE FOR IV PUSH (FOR BLOOD PRESSURE SUPPORT)
PREFILLED_SYRINGE | INTRAVENOUS | Status: AC
  Filled 2024-05-29: qty 10

## 2024-05-29 MED ORDER — ONDANSETRON HCL 4 MG/2ML IJ SOLN
INTRAMUSCULAR | Status: AC
Start: 2024-05-29 — End: 2024-05-29
  Filled 2024-05-29: qty 2

## 2024-05-29 MED ORDER — MIDAZOLAM HCL 2 MG/2ML IJ SOLN: 0.5000 mg | Freq: Once | INTRAMUSCULAR | Status: DC | PRN

## 2024-05-29 MED ORDER — CEFAZOLIN SODIUM-DEXTROSE 2-4 GM/100ML-% IV SOLN
2.0000 g | INTRAVENOUS | Status: AC
  Administered 2024-05-29: 2 g via INTRAVENOUS
  Filled 2024-05-29: qty 100

## 2024-05-29 MED ORDER — PHENYLEPHRINE HCL (PRESSORS) 10 MG/ML IV SOLN
INTRAVENOUS | Status: DC | PRN
Start: 2024-05-29 — End: 2024-05-29
  Administered 2024-05-29 (×3): 80 ug via INTRAVENOUS

## 2024-05-29 MED ORDER — LACTATED RINGERS IV SOLN: INTRAVENOUS | Status: DC

## 2024-05-29 MED ORDER — LIDOCAINE HCL (PF) 2 % IJ SOLN
INTRAMUSCULAR | Status: AC
  Filled 2024-05-29: qty 5

## 2024-05-29 MED ORDER — OXYCODONE HCL 5 MG PO TABS: 5.0000 mg | ORAL_TABLET | Freq: Four times a day (QID) | ORAL | 0 refills | Status: DC | PRN

## 2024-05-29 MED ORDER — LIDOCAINE HCL (PF) 2 % IJ SOLN
INTRAMUSCULAR | Status: DC | PRN
  Administered 2024-05-29: 50 mg via INTRADERMAL

## 2024-05-29 MED ORDER — DEXAMETHASONE SODIUM PHOSPHATE 10 MG/ML IJ SOLN
4.0000 mg | INTRAMUSCULAR | Status: DC
Start: 2024-05-29 — End: 2024-05-29

## 2024-05-29 MED ORDER — HYDROMORPHONE HCL 1 MG/ML IJ SOLN
INTRAMUSCULAR | Status: AC
  Filled 2024-05-29: qty 1

## 2024-05-29 MED ORDER — SUGAMMADEX SODIUM 200 MG/2ML IV SOLN
INTRAVENOUS | Status: AC
  Filled 2024-05-29: qty 2

## 2024-05-29 MED ORDER — FENTANYL CITRATE (PF) 100 MCG/2ML IJ SOLN
INTRAMUSCULAR | Status: DC | PRN
  Administered 2024-05-29 (×2): 50 ug via INTRAVENOUS

## 2024-05-29 MED ORDER — ROCURONIUM BROMIDE 10 MG/ML (PF) SYRINGE
PREFILLED_SYRINGE | INTRAVENOUS | Status: DC | PRN
  Administered 2024-05-29: 50 mg via INTRAVENOUS

## 2024-05-29 MED ORDER — ACETAMINOPHEN 500 MG PO TABS
1000.0000 mg | ORAL_TABLET | ORAL | Status: AC
  Administered 2024-05-29: 1000 mg via ORAL
  Filled 2024-05-29: qty 2

## 2024-05-29 MED ORDER — DEXAMETHASONE SODIUM PHOSPHATE 10 MG/ML IJ SOLN
INTRAMUSCULAR | Status: DC | PRN
Start: 2024-05-29 — End: 2024-05-29
  Administered 2024-05-29: 5 mg via INTRAVENOUS

## 2024-05-29 MED ORDER — ROCURONIUM BROMIDE 10 MG/ML (PF) SYRINGE
PREFILLED_SYRINGE | INTRAVENOUS | Status: AC
  Filled 2024-05-29: qty 10

## 2024-05-29 MED ORDER — CHLORHEXIDINE GLUCONATE CLOTH 2 % EX PADS: 6.0000 | MEDICATED_PAD | Freq: Once | CUTANEOUS | Status: DC

## 2024-05-29 SURGICAL SUPPLY — 39 items
BAG COUNTER SPONGE SURGICOUNT (BAG) IMPLANT
BENZOIN TINCTURE PRP APPL 2/3 (GAUZE/BANDAGES/DRESSINGS) IMPLANT
BNDG ADH 1X3 SHEER STRL LF (GAUZE/BANDAGES/DRESSINGS) IMPLANT
CABLE HIGH FREQUENCY MONO STRZ (ELECTRODE) ×2 IMPLANT
CHLORAPREP W/TINT 26 (MISCELLANEOUS) ×2 IMPLANT
CLIP APPLIE 5 13 M/L LIGAMAX5 (MISCELLANEOUS) IMPLANT
CLIP APPLIE ROT 10 11.4 M/L (STAPLE) IMPLANT
CLIP LIGATING HEM O LOK PURPLE (MISCELLANEOUS) IMPLANT
CLIP LIGATING HEMO O LOK GREEN (MISCELLANEOUS) ×2 IMPLANT
COVER MAYO STAND XLG (MISCELLANEOUS) ×2 IMPLANT
COVER SURGICAL LIGHT HANDLE (MISCELLANEOUS) ×2 IMPLANT
DRAIN CHANNEL 19F RND (DRAIN) IMPLANT
DRAPE C-ARM 42X120 X-RAY (DRAPES) IMPLANT
ELECT REM PT RETURN 15FT ADLT (MISCELLANEOUS) ×2 IMPLANT
ENDOLOOP SUT PDS II 0 18 (SUTURE) IMPLANT
EVACUATOR SILICONE 100CC (DRAIN) IMPLANT
GLOVE BIOGEL PI IND STRL 7.0 (GLOVE) ×2 IMPLANT
GLOVE SURG SS PI 7.0 STRL IVOR (GLOVE) ×2 IMPLANT
GOWN STRL REUS W/ TWL LRG LVL3 (GOWN DISPOSABLE) ×2 IMPLANT
GRASPER SUT TROCAR 14GX15 (MISCELLANEOUS) IMPLANT
IRRIGATION SUCT STRKRFLW 2 WTP (MISCELLANEOUS) ×2 IMPLANT
KIT BASIN OR (CUSTOM PROCEDURE TRAY) ×2 IMPLANT
KIT TURNOVER KIT A (KITS) ×2 IMPLANT
NDL HYPO 22X1.5 SAFETY MO (MISCELLANEOUS) ×2 IMPLANT
NEEDLE HYPO 22X1.5 SAFETY MO (MISCELLANEOUS) ×1 IMPLANT
POUCH RETRIEVAL ECOSAC 10 (ENDOMECHANICALS) ×2 IMPLANT
SCISSORS LAP 5X35 DISP (ENDOMECHANICALS) ×2 IMPLANT
SET CHOLANGIOGRAPH MIX (MISCELLANEOUS) IMPLANT
SET TUBE SMOKE EVAC HIGH FLOW (TUBING) ×2 IMPLANT
SLEEVE Z-THREAD 5X100MM (TROCAR) ×4 IMPLANT
SPIKE FLUID TRANSFER (MISCELLANEOUS) ×2 IMPLANT
STRIP CLOSURE SKIN 1/2X4 (GAUZE/BANDAGES/DRESSINGS) IMPLANT
SUT ETHILON 2 0 PS N (SUTURE) IMPLANT
SUT MNCRL AB 4-0 PS2 18 (SUTURE) ×2 IMPLANT
SUT VICRYL 0 UR6 27IN ABS (SUTURE) IMPLANT
TOWEL OR 17X26 10 PK STRL BLUE (TOWEL DISPOSABLE) ×2 IMPLANT
TRAY LAPAROSCOPIC (CUSTOM PROCEDURE TRAY) ×2 IMPLANT
TROCAR Z THREAD OPTICAL 12X100 (TROCAR) ×2 IMPLANT
TROCAR Z-THREAD OPTICAL 5X100M (TROCAR) ×2 IMPLANT

## 2024-05-29 NOTE — Discharge Instructions (Signed)
 [facility] ______[facility] LAPAROSCOPIC SURGERY: POST OP INSTRUCTIONS Always review your discharge instruction sheet given to you by the facility where your surgery was performed. IF YOU HAVE DISABILITY OR FAMILY LEAVE FORMS, YOU MUST BRING THEM TO THE OFFICE FOR PROCESSING.   DO NOT GIVE THEM TO YOUR DOCTOR.  A prescription for pain medication may be given to you upon discharge.  Take your pain medication as prescribed, if needed.  If narcotic pain medicine is not needed, then you may take acetaminophen  (Tylenol ) or ibuprofen  (Advil ) as needed. Take your usually prescribed medications unless otherwise directed. If you need a refill on your pain medication, please contact your pharmacy.  They will contact our office to request authorization. Prescriptions will not be filled after 5pm or on week-ends. You should follow a light diet the first few days after arrival home, such as soup and crackers, etc.  Be sure to include lots of fluids daily. Most patients will experience some swelling and bruising in the area of the incisions.  Ice packs will help.  Swelling and bruising can take several days to resolve.  It is common to experience some constipation if taking pain medication after surgery.  Increasing fluid intake and taking a stool softener (such as Colace) will usually help or prevent this problem from occurring.  A mild laxative (Milk of Magnesia or Miralax) should be taken according to package instructions if there are no bowel movements after 48 hours. Unless discharge instructions indicate otherwise, you may remove your bandages 24-48 hours after surgery, and you may shower at that time.  You may have steri-strips (small skin tapes) in place directly over the incision.  These strips should be left on the skin for 7-10 days.  If your surgeon used skin glue on the incision, you may shower in 24 hours.  The glue will flake off over the next 2-3 weeks.  Any sutures or staples will be  removed at the office during your follow-up visit. ACTIVITIES:  You may resume regular (light) daily activities beginning the next day--such as daily self-care, walking, climbing stairs--gradually increasing activities as tolerated.  You may have sexual intercourse when it is comfortable.  Refrain from any heavy lifting or straining until approved by your doctor. You may drive when you are no longer taking prescription pain medication, you can comfortably wear a seatbelt, and you can safely maneuver your car and apply brakes. RETURN TO WORK:  __________________________________________________________ Lisa Casey should see your doctor in the office for a follow-up appointment approximately 2-3 weeks after your surgery.  Make sure that you call for this appointment within a day or two after you arrive home to insure a convenient appointment time. OTHER INSTRUCTIONS: __________________________________________________________________________________________________________________________ __________________________________________________________________________________________________________________________ WHEN TO CALL YOUR DOCTOR: Fever over 101.0 Inability to urinate Continued bleeding from incision. Increased pain, redness, or drainage from the incision. Increasing abdominal pain  The clinic staff is available to answer your questions during regular business hours.  Please don't hesitate to call and ask to speak to one of the nurses for clinical concerns.  If you have a medical emergency, go to the nearest emergency room or call 911.  A surgeon from [facility] is always on call at the hospital. [address], [address], [place], [place]  [address] ? [address] [address], [place], [place]   [address] [phone] ? [phone] ? FAX [phone] Web site: [email or web address]

## 2024-05-29 NOTE — Anesthesia Postprocedure Evaluation (Signed)
 Anesthesia Post Note  Patient: Lisa Casey  Procedure(s) Performed: LAPAROSCOPIC CHOLECYSTECTOMY WITH ICG DYE     Patient location during evaluation: PACU Anesthesia Type: General Level of consciousness: oriented, patient cooperative and sedated Pain management: pain level controlled Vital Signs Assessment: post-procedure vital signs reviewed and stable Respiratory status: spontaneous breathing, nonlabored ventilation and respiratory function stable Cardiovascular status: blood pressure returned to baseline and stable Postop Assessment: no apparent nausea or vomiting Anesthetic complications: no   No notable events documented.  Last Vitals:  Vitals:   05/29/24 0915 05/29/24 0930  BP: (!) 107/55 118/66  Pulse: 70 70  Resp: 20 18  Temp:    SpO2: 100% 91%    Last Pain:  Vitals:   05/29/24 0857  TempSrc:   PainSc: Asleep                 Jamoni Broadfoot,E. Devean Skoczylas

## 2024-05-29 NOTE — Transfer of Care (Signed)
 Immediate Anesthesia Transfer of Care Note  Patient: Lisa Casey  Procedure(s) Performed: LAPAROSCOPIC CHOLECYSTECTOMY WITH ICG DYE  Patient Location: PACU  Anesthesia Type:General  Level of Consciousness: awake, alert , and oriented  Airway & Oxygen Therapy: Patient Spontanous Breathing and Patient connected to face mask oxygen  Post-op Assessment: Report given to RN and Post -op Vital signs reviewed and stable  Post vital signs: Reviewed and stable  Last Vitals:  Vitals Value Taken Time  BP    Temp    Pulse 74 05/29/24 08:57  Resp 19 05/29/24 08:57  SpO2 97 % 05/29/24 08:57  Vitals shown include unfiled device data.  Last Pain:  Vitals:   05/29/24 0552  TempSrc: Oral  PainSc:          Complications: No notable events documented.

## 2024-05-29 NOTE — Anesthesia Procedure Notes (Signed)
 Procedure Name: Intubation Date/Time: 05/29/2024 7:53 AM  Performed by: Buster Catheryn SAUNDERS, CRNAPre-anesthesia Checklist: Patient identified, Emergency Drugs available, Suction available and Patient being monitored Patient Re-evaluated:Patient Re-evaluated prior to induction Oxygen Delivery Method: Circle system utilized Preoxygenation: Pre-oxygenation with 100% oxygen Induction Type: IV induction Ventilation: Mask ventilation without difficulty Laryngoscope Size: Miller and 2 Grade View: Grade II Tube type: Oral Tube size: 7.0 mm Number of attempts: 1 Airway Equipment and Method: Stylet and Oral airway Placement Confirmation: ETT inserted through vocal cords under direct vision, positive ETCO2 and breath sounds checked- equal and bilateral Secured at: 22 cm Tube secured with: Tape Dental Injury: Teeth and Oropharynx as per pre-operative assessment

## 2024-05-29 NOTE — H&P (Signed)
 Chief Complaint  Patient presents with  New Consultation  EVAL OF GB   Subjective   Lisa Casey is a 63 y.o. female new patient in today for: History of Present Illness Lisa Casey is a 63 year old female with irritable bowel syndrome who presents with abdominal pain and nausea. She is accompanied by her son.  She experiences significant abdominal pain, particularly after eating rich or fatty foods, and has been cautious with her diet, consuming small amounts like oyster crackers. Nausea and an inability to eat have led to weight loss from 206 to 188 pounds since June. Severe pain occurs after consuming dairy products, necessitating emergency medical services multiple times. An endoscopy with ultrasound revealed multiple gallstones. She has been hospitalized for this condition in 2023. Her irritable bowel syndrome may be affected by her current condition, impacting her daily life and dietary choices.  Social Drivers of Health with Concerns   Tobacco Use: Low Risk (05/12/2024)  Patient History  Smoking Tobacco Use: Never  Smokeless Tobacco Use: Never  Recent Concern: Tobacco Use - Medium Risk (05/08/2024)  Received from [facility]  Patient History  Smoking Tobacco Use: Former  Smokeless Tobacco Use: Never  Physical Activity: Unknown (12/27/2023)  Received from [facility]  Exercise Vital Sign  On average, how many days per week do you engage in moderate to strenuous exercise (like a brisk walk)?: 0 days  Stress: Patient Declined (12/27/2023)  Received from [facility] - Occupational Stress Questionnaire  Feeling of Stress : Patient declined    No data to display      Outpatient Medications Prior to Visit  Medication Sig Dispense Refill  amLODIPine  (NORVASC ) 10 MG tablet Take 10 mg by mouth once daily  cetirizine  (ZYRTEC ) 10 MG tablet Take 10 mg by mouth once daily  hydrOXYzine  (ATARAX ) 25 MG tablet Take 25 mg by mouth  every 6 (six) hours as needed  levocetirizine (XYZAL ) 5 MG tablet Take 5 mg by mouth every evening  metFORMIN  (GLUCOPHAGE -XR) 750 MG XR tablet Take 750 mg by mouth  metoprolol  SUCCinate (TOPROL -XL) 25 MG XL tablet Take 25 mg by mouth once daily  omeprazole (PRILOSEC) 40 MG DR capsule Take 40 mg by mouth once daily   No facility-administered medications prior to visit.    Objective   Vitals:  05/12/24 1516  BP: 127/79  Pulse: 81  Resp: 18  Temp: 37.1 C (98.7 F)  SpO2: 98%  Weight: 85.4 kg (188 lb 3.2 oz)  Height: 165.1 cm (5' 5)  PainSc: 10-Worst pain ever  PainLoc: Abdomen   Body mass index is 31.32 kg/m. Physical Exam Constitutional:  Appearance: Normal appearance.  HENT:  Head: Normocephalic and atraumatic.  Pulmonary:  Effort: Pulmonary effort is normal.  Abdominal:  Comments: Tender in upper abdomen  Musculoskeletal:  General: Normal range of motion.  Cervical back: Normal range of motion.  Neurological:  General: No focal deficit present.  Mental Status: She is alert and oriented to person, place, and time. Mental status is at baseline.  Psychiatric:  Mood and Affect: Mood normal.  Behavior: Behavior normal.  Thought Content: Thought content normal.    I reviewed EUS results showing gallstones but no CBD abnormality, I reviewed notes by Belvie Just showing persistent epigastric pain consistent with cholecystitis  Assessment/Plan:   Assessment & Plan Cholelithiasis with recurrent biliary colic Cholelithiasis confirmed with gallstones causing significant symptoms. No liver damage. High recurrence risk without surgery. - Recommend laparoscopic  cholecystectomy to prevent future episodes. We discussed the etiology of gallstones and probably cause pain. We discussed exacerbating factors including fatty meals. We discussed the details of surgery for removal of the gallbladder including general anesthesia, 4 small incisions in the patient's abdomen, removal of  the patient's gallbladder with the liver and common bile duct, and most likely outpatient procedure. We discussed risks of common bile duct injury, cystic duct stump leak, injury to liver, bleeding, infection, need for open procedure, and post cholecystectomy syndrome. The patient showed good understanding and wanted to proceed with laparoscopic cholecystectomy.   Planned laparoscopic cholecystectomy Laparoscopic cholecystectomy planned to address cholelithiasis and recurrent biliary colic. - Schedule surgery within two to six weeks.

## 2024-05-29 NOTE — Op Note (Addendum)
 PATIENT:  Lisa Casey  63 y.o. female  PRE-OPERATIVE DIAGNOSIS:  CHRONIC CALCULUS CHOLE  POST-OPERATIVE DIAGNOSIS:  CHRONIC CALCULUS CHOLE  PROCEDURE:  Procedure(s): LAPAROSCOPIC CHOLECYSTECTOMY WITH ICG DYE   SURGEON:  Anglia Blakley, Herlene Righter, MD   ASSISTANT: none  ANESTHESIA:   local and general  Indications for procedure: [name] is a 63 y.o. female with symptoms of Abdominal pain consistent with gallbladder disease, Confirmed by ultrasound.  Description of procedure: The patient was brought into the operative suite, placed supine. Anesthesia was administered with endotracheal tube. Patient was strapped in place and foot board was secured. All pressure points were offloaded by foam padding. The patient was prepped and draped in the usual sterile fashion.  A periumbilical incision was made and optical entry was used to enter the abdomen. 2 5 mm trocars were placed on in the right lateral space on in the right subcostal space. A 12mm trocar was placed in the subxiphoid space. Marcaine  was infused to the subxiphoid space and lateral upper right abdomen in the transversus abdominis plane. Next the patient was placed in reverse trendelenberg. The gallbladder appearedchronically inflamed.   The gallbladder was retracted cephalad and lateral. The peritoneum was reflected off the infundibulum working lateral to medial. The cystic duct and cystic artery were identified and further dissection revealed a critical view. The cystic duct and cystic artery were doubly clipped and ligated. ICG view was used to attempt identification of biliary structures. Cystic duct was identified and the gallbladder was green.  The gallbladder was removed off the liver bed with cautery. The Gallbladder was placed in a specimen bag. The gallbladder fossa was irrigated and hemostasis was applied with cautery. The gallbladder was removed via the 12mm trocar. The fascial defect was closed with interrupted 0  vicryl suture via laparoscopic trans-fascial suture passer. Pneumoperitoneum was removed, all trocar were removed. All incisions were closed with 4-0 monocryl subcuticular stitch. The patient woke from anesthesia and was brought to PACU in stable condition. All counts were correct  Findings: chronic cholecystitis, multiple small gallstones  Specimen: gallbladder  Blood loss: 10 ml  Local anesthesia: 30 ml Marcaine   Complications: none  PLAN OF CARE: Discharge to home after PACU  PATIENT DISPOSITION:  PACU - hemodynamically stable.  Herlene Righter [facility], [place]

## 2024-05-30 ENCOUNTER — Encounter (HOSPITAL_COMMUNITY): Payer: Self-pay | Admitting: General Surgery

## 2024-06-01 ENCOUNTER — Telehealth: Payer: Self-pay

## 2024-06-01 LAB — SURGICAL PATHOLOGY

## 2024-06-01 NOTE — Transitions of Care (Post Inpatient/ED Visit) (Signed)
   06/01/2024  Name: Lisa Casey MRN: [id number] DOB: 1961/02/20  Today's TOC FU Call Status: Today's TOC FU Call Status:: Successful TOC FU Call Completed TOC FU Call Complete Date: 06/01/24 Patient's Name and Date of Birth confirmed.  Transition Care Management Follow-up Telephone Call Date of Discharge: 05/29/24 Discharge Facility: [facility]) Type of Discharge: Inpatient Admission Primary Inpatient Discharge Diagnosis:: CHRONIC CALCULUS CHOLE How have you been since you were released from the hospital?: Better Any questions or concerns?: No  Items Reviewed: Did you receive and understand the discharge instructions provided?: Yes Medications obtained,verified, and reconciled?: Yes (Medications Reviewed) Any new allergies since your discharge?: No Dietary orders reviewed?: Yes Do you have support at home?: No  Medications Reviewed Today: Medications Reviewed Today   Medications were not reviewed in this encounter     Home Care and Equipment/Supplies: Were Home Health Services Ordered?: No Any new equipment or medical supplies ordered?: No  Functional Questionnaire: Do you need assistance with bathing/showering or dressing?: No Do you need assistance with meal preparation?: No Do you need assistance with eating?: No Do you have difficulty maintaining continence: No Do you need assistance with getting out of bed/getting out of a chair/moving?: Yes Do you have difficulty managing or taking your medications?: No  Follow up appointments reviewed: PCP Follow-up appointment confirmed?: Yes Date of PCP follow-up appointment?: 06/02/24 Follow-up Provider: Georgina Speaks Specialist [facility] Follow-up appointment confirmed?: No Reason Specialist Follow-Up Not Confirmed: Appointment Sceduled by [facility] Calling Clinician Do you need transportation to your follow-up appointment?: No Do you understand care options if your condition(s) worsen?: Yes-patient verbalized  understanding    SIGNATURE Kristeen Lunger, CMA

## 2024-06-02 ENCOUNTER — Encounter: Payer: Self-pay | Admitting: Nurse Practitioner

## 2024-06-02 ENCOUNTER — Telehealth (INDEPENDENT_AMBULATORY_CARE_PROVIDER_SITE_OTHER): Payer: Self-pay | Admitting: Nurse Practitioner

## 2024-06-02 VITALS — BP 141/77 | HR 72 | Temp 98.5°F | Ht 65.0 in | Wt 188.0 lb

## 2024-06-02 DIAGNOSIS — Z9049 Acquired absence of other specified parts of digestive tract: Secondary | ICD-10-CM

## 2024-06-02 NOTE — Progress Notes (Signed)
 Virtual Visit via Video Note  I,Jameka J Llittleton, CMA,acting as a scribe for Lisa Ada, FNP.,have documented all relevant documentation on the behalf of Lisa Ada, FNP,as directed by  Lisa Ada, FNP while in the presence of Lisa Ada, FNP.  I connected with Takari M Mcclafferty on 06/02/24 at  4:00 PM EDT by a video enabled telemedicine application and verified that I am speaking with the correct person using two identifiers.  Patient Location: Home Provider Location: Office/Clinic  I discussed the limitations, risks, security, and privacy concerns of performing an evaluation and management service by video and the availability of in person appointments. I also discussed with the patient that there may be a patient responsible charge related to this service. The patient expressed understanding and agreed to proceed.  Subjective: PCP: Casey Gaines, FNP  Chief Complaint  Patient presents with   ER f/u    Patient reports she recently gotten her gallbladder. She reports overall she is feeling better. She doesn't have any questions or concerns at this time.    Virtual visit post gallbladder removal on 05/29/2024. She is due to see surgery on 10/3. She tried to eat more solid foods which she did not tolerate well. No changes to her medications. She will be out of work from 4-6 weeks. She is being ambulatory. She is also getting up and showering and moving around. The first day after surgery she feels she may have moved around too much and had to take pain medications.  She has not taken her blood sugar since being home from the hospital. She has taken her blood pressure medications today. She is having a little pain and the ibuprofen  she feels may be wearing off.      ROS: Per HPI  Current Outpatient Medications:    acetaminophen  (TYLENOL ) 500 MG tablet, Take 1,000 mg by mouth every 6 (six) hours as needed for moderate pain or headache., Disp: , Rfl:    albuterol  (VENTOLIN  HFA)  108 (90 Base) MCG/ACT inhaler, Inhale 2 puffs into the lungs every 6 (six) hours as needed for wheezing or shortness of breath., Disp: , Rfl:    amLODipine  (NORVASC ) 10 MG tablet, Take 1 tablet (10 mg total) by mouth daily., Disp: 180 tablet, Rfl: 3   Azelastine  HCl 0.15 % SOLN, Place 2 sprays into the nose as needed., Disp: 3 mL, Rfl: 1   eszopiclone  (LUNESTA ) 1 MG TABS tablet, TAKE 1 TABLET BY MOUTH AT BEDTIME AS NEEDED FOR SLEEP. TAKE  IMMEDIATELY  BEFORE  BEDTIME, Disp: 15 tablet, Rfl: 0   glucose blood (ONETOUCH VERIO) test strip, Use as directed to check blood sugars 2 times per day dx: e11.65, Disp: 100 each, Rfl: 5   hydrOXYzine  (ATARAX ) 25 MG tablet, TAKE 1 TABLET BY MOUTH EVERY 6 HOURS AS NEEDED, Disp: 30 tablet, Rfl: 0   ibuprofen  (ADVIL ) 800 MG tablet, Take 1 tablet (800 mg total) by mouth every 8 (eight) hours as needed., Disp: 30 tablet, Rfl: 0   isosorbide  mononitrate (IMDUR ) 30 MG 24 hr tablet, Take 1 tablet (30 mg total) by mouth daily. (Patient taking differently: Take 30 mg by mouth at bedtime.), Disp: 90 tablet, Rfl: 3   levocetirizine (XYZAL ) 5 MG tablet, TAKE 1 TABLET BY MOUTH ONCE DAILY IN THE EVENING (Patient taking differently: Take 5 mg by mouth daily as needed for allergies.), Disp: 90 tablet, Rfl: 0   linaclotide  (LINZESS ) 72 MCG capsule, Take 1 capsule (72 mcg total) by mouth daily before  breakfast. (Patient taking differently: Take 72 mcg by mouth daily as needed (constipation).), Disp: 90 capsule, Rfl: 1   meloxicam  (MOBIC ) 15 MG tablet, Take 1 tablet by mouth once daily (Patient taking differently: Take 15 mg by mouth daily as needed for pain.), Disp: 30 tablet, Rfl: 0   metFORMIN  (GLUCOPHAGE -XR) 750 MG 24 hr tablet, Take 1 tablet (750 mg total) by mouth daily with breakfast., Disp: 90 tablet, Rfl: 1   nitroGLYCERIN  (NITROSTAT ) 0.4 MG SL tablet, Place 1 tablet (0.4 mg total) under the tongue every 5 (five) minutes as needed for chest pain., Disp: 25 tablet, Rfl: 4    ondansetron  (ZOFRAN -ODT) 8 MG disintegrating tablet, Take 1 tablet (8 mg total) by mouth every 8 (eight) hours as needed for nausea., Disp: 20 tablet, Rfl: 0   OneTouch Delica Lancets 33G MISC, Use as directed to check blood sugars 2 times per day dx: e11.65, Disp: 100 each, Rfl: 5   oxyCODONE  (OXY IR/ROXICODONE ) 5 MG immediate release tablet, Take 1 tablet (5 mg total) by mouth every 6 (six) hours as needed for severe pain (pain score 7-10)., Disp: 10 tablet, Rfl: 0   oxyCODONE -acetaminophen  (PERCOCET/ROXICET) 5-325 MG tablet, Take 1 tablet by mouth every 8 (eight) hours as needed for severe pain (pain score 7-10)., Disp: 6 tablet, Rfl: 0   phentermine (ADIPEX-P) 37.5 MG tablet, Take 37.5 mg by mouth daily., Disp: , Rfl:    pregabalin  (LYRICA ) 225 MG capsule, Take 225 mg by mouth every 12 (twelve) hours as needed (pain)., Disp: , Rfl:    venlafaxine  (EFFEXOR ) 25 MG tablet, Take 1 tablet (25 mg total) by mouth 2 (two) times daily., Disp: 180 tablet, Rfl: 1  Observations/Objective: Today's Vitals   06/02/24 0958  BP: (!) 141/77  Pulse: 72  Temp: 98.5 F (36.9 C)  TempSrc: Oral  Weight: 188 lb (85.3 kg)  Height: 5' 5 (1.651 m)  PainSc: 0-No pain   Physical Exam Vitals reviewed.  Constitutional:      General: She is not in acute distress.    Appearance: Normal appearance. She is obese.  Pulmonary:     Effort: Pulmonary effort is normal. No respiratory distress.  Skin:    Capillary Refill: Capillary refill takes less than 2 seconds.  Neurological:     General: No focal deficit present.     Mental Status: She is alert and oriented to person, place, and time.     Cranial Nerves: No cranial nerve deficit.  Psychiatric:        Mood and Affect: Mood normal.        Behavior: Behavior normal.        Thought Content: Thought content normal.        Judgment: Judgment normal.     Assessment and Plan: S/P cholecystectomy Assessment & Plan: She is doing well since her surgery. She did  not have any medication changes. She is due to f/u with Dr. Sedalia the first week of October.  She will be out of work 4-6 weeks. She is encouraged to ensure she is getting up to ambulate and take good deep breaths at least once every 1-2 hours.      Follow Up Instructions: Return if symptoms worsen or fail to improve.   I discussed the assessment and treatment plan with the patient. The patient was provided an opportunity to ask questions, and all were answered. The patient agreed with the plan and demonstrated an understanding of the instructions.   The patient was advised  to call back or seek an in-person evaluation if the symptoms worsen or if the condition fails to improve as anticipated.  The above assessment and management plan was discussed with the patient. The patient verbalized understanding of and has agreed to the management plan.   LILLETTE Lisa Ada, FNP, have reviewed all documentation for this visit. The documentation on 06/02/24 for the exam, diagnosis, procedures, and orders are all accurate and complete.

## 2024-06-02 NOTE — Assessment & Plan Note (Signed)
 She is doing well since her surgery. She did not have any medication changes. She is due to f/u with Dr. Sedalia the first week of October.  She will be out of work 4-6 weeks. She is encouraged to ensure she is getting up to ambulate and take good deep breaths at least once every 1-2 hours.

## 2024-06-04 ENCOUNTER — Other Ambulatory Visit: Payer: Self-pay

## 2024-06-04 ENCOUNTER — Inpatient Hospital Stay (HOSPITAL_COMMUNITY)

## 2024-06-04 ENCOUNTER — Inpatient Hospital Stay (HOSPITAL_COMMUNITY)
Admission: EM | Admit: 2024-06-04 | Discharge: 2024-06-07 | DRG: 446 | Disposition: A | Discharge status: Home / Self Care | Source: Ambulatory Visit | Attending: General Surgery | Admitting: General Surgery

## 2024-06-04 ENCOUNTER — Encounter (HOSPITAL_COMMUNITY): Payer: Self-pay

## 2024-06-04 DIAGNOSIS — Z888 Allergy status to other drugs, medicaments and biological substances status: Secondary | ICD-10-CM | POA: Diagnosis not present

## 2024-06-04 DIAGNOSIS — K2970 Gastritis, unspecified, without bleeding: Secondary | ICD-10-CM | POA: Diagnosis present

## 2024-06-04 DIAGNOSIS — G8918 Other acute postprocedural pain: Principal | ICD-10-CM | POA: Diagnosis present

## 2024-06-04 DIAGNOSIS — R531 Weakness: Secondary | ICD-10-CM | POA: Diagnosis not present

## 2024-06-04 DIAGNOSIS — M797 Fibromyalgia: Secondary | ICD-10-CM | POA: Diagnosis present

## 2024-06-04 DIAGNOSIS — I252 Old myocardial infarction: Secondary | ICD-10-CM | POA: Diagnosis not present

## 2024-06-04 DIAGNOSIS — Z9049 Acquired absence of other specified parts of digestive tract: Secondary | ICD-10-CM | POA: Diagnosis not present

## 2024-06-04 DIAGNOSIS — I2510 Atherosclerotic heart disease of native coronary artery without angina pectoris: Secondary | ICD-10-CM | POA: Diagnosis present

## 2024-06-04 DIAGNOSIS — Z885 Allergy status to narcotic agent status: Secondary | ICD-10-CM | POA: Diagnosis not present

## 2024-06-04 DIAGNOSIS — K3189 Other diseases of stomach and duodenum: Secondary | ICD-10-CM | POA: Diagnosis not present

## 2024-06-04 DIAGNOSIS — E669 Obesity, unspecified: Secondary | ICD-10-CM | POA: Diagnosis present

## 2024-06-04 DIAGNOSIS — K219 Gastro-esophageal reflux disease without esophagitis: Secondary | ICD-10-CM | POA: Diagnosis present

## 2024-06-04 DIAGNOSIS — Z79899 Other long term (current) drug therapy: Secondary | ICD-10-CM | POA: Diagnosis not present

## 2024-06-04 DIAGNOSIS — Z90710 Acquired absence of both cervix and uterus: Secondary | ICD-10-CM | POA: Diagnosis not present

## 2024-06-04 DIAGNOSIS — R4182 Altered mental status, unspecified: Secondary | ICD-10-CM | POA: Diagnosis not present

## 2024-06-04 DIAGNOSIS — F32A Depression, unspecified: Secondary | ICD-10-CM | POA: Diagnosis present

## 2024-06-04 DIAGNOSIS — K838 Other specified diseases of biliary tract: Secondary | ICD-10-CM

## 2024-06-04 DIAGNOSIS — K8050 Calculus of bile duct without cholangitis or cholecystitis without obstruction: Secondary | ICD-10-CM | POA: Diagnosis present

## 2024-06-04 DIAGNOSIS — F419 Anxiety disorder, unspecified: Secondary | ICD-10-CM | POA: Diagnosis present

## 2024-06-04 DIAGNOSIS — R7989 Other specified abnormal findings of blood chemistry: Secondary | ICD-10-CM | POA: Diagnosis not present

## 2024-06-04 DIAGNOSIS — I10 Essential (primary) hypertension: Secondary | ICD-10-CM | POA: Diagnosis present

## 2024-06-04 DIAGNOSIS — R935 Abnormal findings on diagnostic imaging of other abdominal regions, including retroperitoneum: Secondary | ICD-10-CM

## 2024-06-04 DIAGNOSIS — Z7984 Long term (current) use of oral hypoglycemic drugs: Secondary | ICD-10-CM

## 2024-06-04 DIAGNOSIS — K5710 Diverticulosis of small intestine without perforation or abscess without bleeding: Secondary | ICD-10-CM | POA: Diagnosis not present

## 2024-06-04 DIAGNOSIS — Z87891 Personal history of nicotine dependence: Secondary | ICD-10-CM

## 2024-06-04 DIAGNOSIS — Z6831 Body mass index (BMI) 31.0-31.9, adult: Secondary | ICD-10-CM | POA: Diagnosis not present

## 2024-06-04 DIAGNOSIS — E119 Type 2 diabetes mellitus without complications: Secondary | ICD-10-CM | POA: Diagnosis present

## 2024-06-04 DIAGNOSIS — Z87442 Personal history of urinary calculi: Secondary | ICD-10-CM

## 2024-06-04 DIAGNOSIS — Z9889 Other specified postprocedural states: Secondary | ICD-10-CM | POA: Diagnosis not present

## 2024-06-04 LAB — COMPREHENSIVE METABOLIC PANEL WITH GFR
ALT: 196 U/L — ABNORMAL HIGH (ref 0–44)
AST: 92 U/L — ABNORMAL HIGH (ref 15–41)
Albumin: 4.1 g/dL (ref 3.5–5.0)
Alkaline Phosphatase: 201 U/L — ABNORMAL HIGH (ref 38–126)
Anion gap: 15 (ref 5–15)
BUN: 9 mg/dL (ref 8–23)
CO2: 24 mmol/L (ref 22–32)
Calcium: 10.1 mg/dL (ref 8.9–10.3)
Chloride: 109 mmol/L (ref 98–111)
Creatinine, Ser: 0.71 mg/dL (ref 0.44–1.00)
GFR, Estimated: >60 mL/min (ref >60)
Glucose, Bld: 102 mg/dL — ABNORMAL HIGH (ref 70–99)
Potassium: 4.1 mmol/L (ref 3.5–5.1)
Sodium: 148 mmol/L — ABNORMAL HIGH (ref 135–145)
Total Bilirubin: 2 mg/dL — ABNORMAL HIGH (ref 0.0–1.2)
Total Protein: 6.9 g/dL (ref 6.5–8.1)

## 2024-06-04 LAB — CBC
HCT: 43.3 % (ref 36.0–46.0)
Hemoglobin: 13.6 g/dL (ref 12.0–15.0)
MCH: 28.8 pg (ref 26.0–34.0)
MCHC: 31.4 g/dL (ref 30.0–36.0)
MCV: 91.7 fL (ref 80.0–100.0)
Platelets: 228 K/uL (ref 150–400)
RBC: 4.72 MIL/uL (ref 3.87–5.11)
RDW: 12.6 % (ref 11.5–15.5)
WBC: 6.5 K/uL (ref 4.0–10.5)
nRBC: 0 % (ref 0.0–0.2)

## 2024-06-04 LAB — HIV ANTIBODY (ROUTINE TESTING W REFLEX): HIV Screen 4th Generation wRfx: NONREACTIVE

## 2024-06-04 LAB — GLUCOSE, CAPILLARY: Glucose-Capillary: 102 mg/dL — ABNORMAL HIGH (ref 70–99)

## 2024-06-04 MED ORDER — MORPHINE SULFATE (PF) 2 MG/ML IV SOLN
1.0000 mg | INTRAVENOUS | Status: DC | PRN
  Administered 2024-06-05 (×3): 2 mg via INTRAVENOUS
  Filled 2024-06-04 (×3): qty 1
  Filled 2024-06-04: qty 2

## 2024-06-04 MED ORDER — POTASSIUM CHLORIDE IN NACL 20-0.9 MEQ/L-% IV SOLN
INTRAVENOUS | Status: DC
  Filled 2024-06-04: qty 1000

## 2024-06-04 MED ORDER — IOHEXOL 300 MG/ML  SOLN
100.0000 mL | Freq: Once | INTRAMUSCULAR | Status: AC | PRN
Start: 2024-06-04 — End: 2024-06-04
  Administered 2024-06-04: 100 mL via INTRAVENOUS

## 2024-06-04 MED ORDER — ACETAMINOPHEN 500 MG PO TABS
1000.0000 mg | ORAL_TABLET | Freq: Four times a day (QID) | ORAL | Status: DC
  Administered 2024-06-04 – 2024-06-07 (×9): 1000 mg via ORAL
  Filled 2024-06-04 (×11): qty 2

## 2024-06-04 MED ORDER — MELATONIN 3 MG PO TABS: 3.0000 mg | ORAL_TABLET | Freq: Every evening | ORAL | Status: DC | PRN

## 2024-06-04 MED ORDER — ENOXAPARIN SODIUM 40 MG/0.4ML IJ SOSY
40.0000 mg | PREFILLED_SYRINGE | INTRAMUSCULAR | Status: DC
  Administered 2024-06-05: 40 mg via SUBCUTANEOUS
  Filled 2024-06-04: qty 0.4

## 2024-06-04 MED ORDER — ONDANSETRON 4 MG PO TBDP
4.0000 mg | ORAL_TABLET | Freq: Four times a day (QID) | ORAL | Status: DC | PRN
  Administered 2024-06-06 (×2): 4 mg via ORAL
  Filled 2024-06-04 (×3): qty 1

## 2024-06-04 MED ORDER — POLYETHYLENE GLYCOL 3350 17 G PO PACK
17.0000 g | PACK | Freq: Every day | ORAL | Status: DC
  Administered 2024-06-05 – 2024-06-07 (×3): 17 g via ORAL
  Filled 2024-06-04 (×3): qty 1

## 2024-06-04 MED ORDER — DIPHENHYDRAMINE HCL 50 MG/ML IJ SOLN: 12.5000 mg | Freq: Four times a day (QID) | INTRAMUSCULAR | Status: DC | PRN

## 2024-06-04 MED ORDER — INSULIN ASPART 100 UNIT/ML IJ SOLN
0.0000 [IU] | Freq: Three times a day (TID) | INTRAMUSCULAR | Status: DC
  Administered 2024-06-06 (×2): 2 [IU] via SUBCUTANEOUS
  Filled 2024-06-04: qty 0.15

## 2024-06-04 MED ORDER — ONDANSETRON HCL 4 MG/2ML IJ SOLN
4.0000 mg | Freq: Four times a day (QID) | INTRAMUSCULAR | Status: DC | PRN
  Administered 2024-06-04 – 2024-06-05 (×4): 4 mg via INTRAVENOUS
  Filled 2024-06-04 (×4): qty 2

## 2024-06-04 MED ORDER — PIPERACILLIN-TAZOBACTAM 3.375 G IVPB
3.3750 g | Freq: Three times a day (TID) | INTRAVENOUS | Status: DC
Start: 2024-06-04 — End: 2024-06-07
  Administered 2024-06-04 – 2024-06-07 (×9): 3.375 g via INTRAVENOUS
  Filled 2024-06-04 (×9): qty 50

## 2024-06-04 MED ORDER — PREGABALIN 75 MG PO CAPS
225.0000 mg | ORAL_CAPSULE | Freq: Two times a day (BID) | ORAL | Status: DC | PRN
  Administered 2024-06-06: 225 mg via ORAL
  Filled 2024-06-04: qty 3

## 2024-06-04 MED ORDER — TECHNETIUM TC 99M MEBROFENIN IV KIT
8.0000 | PACK | Freq: Once | INTRAVENOUS | Status: AC
Start: 2024-06-04 — End: 2024-06-04
  Administered 2024-06-04: 8 via INTRAVENOUS

## 2024-06-04 MED ORDER — OXYCODONE HCL 5 MG PO TABS
5.0000 mg | ORAL_TABLET | ORAL | Status: DC | PRN
  Administered 2024-06-04 – 2024-06-06 (×5): 5 mg via ORAL
  Filled 2024-06-04 (×7): qty 1

## 2024-06-04 MED ORDER — DIPHENHYDRAMINE HCL 12.5 MG/5ML PO ELIX: 12.5000 mg | ORAL_SOLUTION | Freq: Four times a day (QID) | ORAL | Status: DC | PRN

## 2024-06-04 MED ORDER — AMLODIPINE BESYLATE 10 MG PO TABS
10.0000 mg | ORAL_TABLET | Freq: Every day | ORAL | Status: DC
  Administered 2024-06-05 – 2024-06-07 (×3): 10 mg via ORAL
  Filled 2024-06-04 (×2): qty 1
  Filled 2024-06-04: qty 2

## 2024-06-04 MED ORDER — ISOSORBIDE MONONITRATE ER 30 MG PO TB24
30.0000 mg | ORAL_TABLET | Freq: Every day | ORAL | Status: DC
  Administered 2024-06-04 – 2024-06-06 (×3): 30 mg via ORAL
  Filled 2024-06-04 (×4): qty 1

## 2024-06-04 MED ORDER — HYDROXYZINE HCL 25 MG PO TABS: 25.0000 mg | ORAL_TABLET | Freq: Four times a day (QID) | ORAL | Status: DC | PRN

## 2024-06-04 MED ORDER — ALBUTEROL SULFATE (2.5 MG/3ML) 0.083% IN NEBU: 2.5000 mg | INHALATION_SOLUTION | Freq: Four times a day (QID) | RESPIRATORY_TRACT | Status: DC | PRN

## 2024-06-04 MED ORDER — VENLAFAXINE HCL 25 MG PO TABS
25.0000 mg | ORAL_TABLET | Freq: Two times a day (BID) | ORAL | Status: DC
  Administered 2024-06-04 – 2024-06-07 (×5): 25 mg via ORAL
  Filled 2024-06-04 (×7): qty 1

## 2024-06-04 MED ORDER — SIMETHICONE 80 MG PO CHEW
40.0000 mg | CHEWABLE_TABLET | Freq: Four times a day (QID) | ORAL | Status: DC | PRN
  Administered 2024-06-04 – 2024-06-06 (×5): 40 mg via ORAL
  Filled 2024-06-04 (×5): qty 1

## 2024-06-04 MED ORDER — HYDRALAZINE HCL 20 MG/ML IJ SOLN: 10.0000 mg | INTRAMUSCULAR | Status: DC | PRN

## 2024-06-04 MED ORDER — NITROGLYCERIN 0.4 MG SL SUBL
0.4000 mg | SUBLINGUAL_TABLET | SUBLINGUAL | Status: DC | PRN
Start: 2024-06-04 — End: 2024-06-07

## 2024-06-04 NOTE — Progress Notes (Signed)
 I spoke briefly with [place] on the Nuc med table. Worsening epigastric pain near larger incision. LFTs up today. CT scan without fluid collection or other abnormality. F/u HIDA scan results. Liquids tonight. Labs in morning

## 2024-06-04 NOTE — ED Triage Notes (Signed)
 Pt presents to ED from home C/O abdominal pain s/p cholecystectomy 6 days ago. Reports no BM since prior to surgery, but has been passing gas.

## 2024-06-04 NOTE — ED Provider Notes (Signed)
 [place] EMERGENCY DEPARTMENT AT [facility] Provider Note   CSN: [id number] Arrival date & time: 06/04/24  1138     Patient presents with: Post-op Problem   Lisa Casey is a 63 y.o. female.   The history is provided by the patient and medical records. No language interpreter was used.     63 year old female history of fibromyalgia, diabetes, CAD, cholelithiasis status post laparoscopic cholecystectomy on 05/29/2024 presenting today with complaint of abdominal pain.  Patient states since her procedure she has had recurrent upper abdominal discomfort.  Pain is a crampy sharp sensation waxing waning improved with pain medication well but once the pain medication wears off her pain is returning.  She endorsed some nausea but without any vomiting.  She endorsed chills but without any fever.  She has not had a bowel movement since her procedure but able to pass flatus.  No complaints of chest pain or shortness of breath no urinary symptoms.  She can always tolerate broth and cannot tolerate any solid food.  She was seen by her PCP today for evaluation and labs was obtained and patient was told that her liver enzymes elevated and she was recommended to come to ER for further assessment.  She had also reach out to her surgical team as well who agrees.  Prior to Admission medications   Medication Sig Start Date End Date Taking? Authorizing Provider  acetaminophen  (TYLENOL ) 500 MG tablet Take 1,000 mg by mouth every 6 (six) hours as needed for moderate pain or headache.    [provider]  albuterol  (VENTOLIN  HFA) 108 (90 Base) MCG/ACT inhaler Inhale 2 puffs into the lungs every 6 (six) hours as needed for wheezing or shortness of breath.    [provider]  amLODipine  (NORVASC ) 10 MG tablet Take 1 tablet (10 mg total) by mouth daily. 11/20/23 07/25/24  Shlomo Wilbert SAUNDERS, MD  Azelastine  HCl 0.15 % SOLN Place 2 sprays into the nose as needed. 07/24/23   Georgina Speaks, FNP   eszopiclone  (LUNESTA ) 1 MG TABS tablet TAKE 1 TABLET BY MOUTH AT BEDTIME AS NEEDED FOR SLEEP. TAKE  IMMEDIATELY  BEFORE  BEDTIME 05/25/24   Georgina Speaks, FNP  glucose blood (ONETOUCH VERIO) test strip Use as directed to check blood sugars 2 times per day dx: e11.65 04/20/21   Ghumman, Ramandeep, NP  hydrOXYzine  (ATARAX ) 25 MG tablet TAKE 1 TABLET BY MOUTH EVERY 6 HOURS AS NEEDED 05/25/24   Georgina Speaks, FNP  ibuprofen  (ADVIL ) 800 MG tablet Take 1 tablet (800 mg total) by mouth every 8 (eight) hours as needed. 05/29/24   Kinsinger, Herlene Righter, MD  isosorbide  mononitrate (IMDUR ) 30 MG 24 hr tablet Take 1 tablet (30 mg total) by mouth daily. Patient taking differently: Take 30 mg by mouth at bedtime. 03/23/24 06/21/24  Shlomo Wilbert SAUNDERS, MD  levocetirizine (XYZAL ) 5 MG tablet TAKE 1 TABLET BY MOUTH ONCE DAILY IN THE EVENING Patient taking differently: Take 5 mg by mouth daily as needed for allergies. 05/22/24   Georgina Speaks, FNP  linaclotide  (LINZESS ) 72 MCG capsule Take 1 capsule (72 mcg total) by mouth daily before breakfast. Patient taking differently: Take 72 mcg by mouth daily as needed (constipation). 01/27/24   Moore, Janece, FNP  meloxicam  (MOBIC ) 15 MG tablet Take 1 tablet by mouth once daily Patient taking differently: Take 15 mg by mouth daily as needed for pain. 12/24/23   Georgina Speaks, FNP  metFORMIN  (GLUCOPHAGE -XR) 750 MG 24 hr tablet Take 1 tablet (  750 mg total) by mouth daily with breakfast. 10/17/23   Georgina Speaks, FNP  nitroGLYCERIN  (NITROSTAT ) 0.4 MG SL tablet Place 1 tablet (0.4 mg total) under the tongue every 5 (five) minutes as needed for chest pain. 03/23/22   Hobart Powell BRAVO, MD  ondansetron  (ZOFRAN -ODT) 8 MG disintegrating tablet Take 1 tablet (8 mg total) by mouth every 8 (eight) hours as needed for nausea. 05/06/24   Charlyn Sora, MD  OneTouch Delica Lancets 33G MISC Use as directed to check blood sugars 2 times per day dx: e11.65 04/20/21   Ghumman, Ramandeep, NP  oxyCODONE  (OXY  IR/ROXICODONE ) 5 MG immediate release tablet Take 1 tablet (5 mg total) by mouth every 6 (six) hours as needed for severe pain (pain score 7-10). 05/29/24   Kinsinger, Herlene Righter, MD  oxyCODONE -acetaminophen  (PERCOCET/ROXICET) 5-325 MG tablet Take 1 tablet by mouth every 8 (eight) hours as needed for severe pain (pain score 7-10). 05/06/24   Charlyn Sora, MD  phentermine (ADIPEX-P) 37.5 MG tablet Take 37.5 mg by mouth daily. 02/26/24   [provider]  pregabalin  (LYRICA ) 225 MG capsule Take 225 mg by mouth every 12 (twelve) hours as needed (pain).    [provider]  venlafaxine  (EFFEXOR ) 25 MG tablet Take 1 tablet (25 mg total) by mouth 2 (two) times daily. 10/17/23   Georgina Speaks, FNP    Allergies: Codeine and Glucophage  [metformin ]    Review of Systems  All other systems reviewed and are negative.   Updated Vital Signs BP (!) 163/77 (BP Location: Left Arm)   Pulse 77   Temp 98.4 F (36.9 C) (Oral)   Resp 16   LMP 08/04/2011   SpO2 99%   Physical Exam Vitals and nursing note reviewed.  Constitutional:      General: She is not in acute distress.    Appearance: She is well-developed. She is obese.  HENT:     Head: Atraumatic.  Eyes:     Conjunctiva/sclera: Conjunctivae normal.  Cardiovascular:     Rate and Rhythm: Normal rate and regular rhythm.     Pulses: Normal pulses.     Heart sounds: Normal heart sounds.  Pulmonary:     Effort: Pulmonary effort is normal.     Breath sounds: No wheezing, rhonchi or rales.  Abdominal:     Palpations: Abdomen is soft.     Tenderness: There is abdominal tenderness (Diffuse tenderness to palpation more significant to epigastric region.  Normal-appearing laparoscopic surgical scar without signs of infection.  Bowel sounds present).  Musculoskeletal:     Cervical back: Neck supple.  Skin:    Findings: No rash.  Neurological:     Mental Status: She is alert.  Psychiatric:        Mood and Affect: Mood normal.      (all labs ordered are listed, but only abnormal results are displayed) Labs Reviewed  HIV ANTIBODY (ROUTINE TESTING W REFLEX)  COMPREHENSIVE METABOLIC PANEL WITH GFR  CBC    EKG: None  Radiology: No results found.   Procedures   Medications Ordered in the ED  acetaminophen  (TYLENOL ) tablet 1,000 mg (has no administration in time range)  oxyCODONE  (Oxy IR/ROXICODONE ) immediate release tablet 5 mg (has no administration in time range)  amLODipine  (NORVASC ) tablet 10 mg (has no administration in time range)  isosorbide  mononitrate (IMDUR ) 24 hr tablet 30 mg (has no administration in time range)  nitroGLYCERIN  (NITROSTAT ) SL tablet 0.4 mg (has no administration in time range)  hydrOXYzine  (ATARAX ) tablet  25 mg (has no administration in time range)  venlafaxine  (EFFEXOR ) tablet 25 mg (has no administration in time range)  pregabalin  (LYRICA ) capsule 225 mg (has no administration in time range)  albuterol  (PROVENTIL ) (2.5 MG/3ML) 0.083% nebulizer solution 2.5 mg (has no administration in time range)  enoxaparin  (LOVENOX ) injection 40 mg (has no administration in time range)  0.9 % NaCl with KCl 20 mEq/ L  infusion (has no administration in time range)  piperacillin -tazobactam (ZOSYN ) IVPB 3.375 g (has no administration in time range)  morphine  (PF) 2 MG/ML injection 1-4 mg (has no administration in time range)  melatonin tablet 3 mg (has no administration in time range)  diphenhydrAMINE  (BENADRYL ) 12.5 MG/5ML elixir 12.5 mg (has no administration in time range)    Or  diphenhydrAMINE  (BENADRYL ) injection 12.5 mg (has no administration in time range)  polyethylene glycol (MIRALAX  / GLYCOLAX ) packet 17 g (has no administration in time range)  ondansetron  (ZOFRAN -ODT) disintegrating tablet 4 mg (has no administration in time range)    Or  ondansetron  (ZOFRAN ) injection 4 mg (has no administration in time range)  simethicone  (MYLICON) chewable tablet 40 mg (has no administration  in time range)  hydrALAZINE  (APRESOLINE ) injection 10 mg (has no administration in time range)                                    Medical Decision Making  BP (!) 163/77 (BP Location: Left Arm)   Pulse 77   Temp 98.4 F (36.9 C) (Oral)   Resp 16   LMP 08/04/2011   SpO2 99%   72:71 PM 63 year old female history of fibromyalgia, diabetes, CAD, cholelithiasis status post laparoscopic cholecystectomy on 05/29/2024 presenting today with complaint of abdominal pain.  Patient states since her procedure she has had recurrent upper abdominal discomfort.  Pain is a crampy sharp sensation waxing waning improved with pain medication well but once the pain medication wears off her pain is returning.  She endorsed some nausea but without any vomiting.  She endorsed chills but without any fever.  She has not had a bowel movement since her procedure but able to pass flatus.  No complaints of chest pain or shortness of breath no urinary symptoms.  She can always tolerate broth and cannot tolerate any solid food.  She was seen by her PCP today for evaluation and labs was obtained and patient was told that her liver enzymes elevated and she was recommended to come to ER for further assessment.  She had also reach out to her surgical team as well who agrees.  Exam notable for tenderness to abdomen no significant epigastric region.  A well-appearing surgical scar without signs of infection.  Although bowel sounds hypoactive it is present.  I offered pain medication but patient crying stating that she just took an oxycodone  about an hour prior to arrival.  She also declined antiemetic at this time stating that she feels okay.  Vitals are notable for elevated blood pressure of 163/77.  Patient is afebrile no hypoxia.  I was able to communicate with general surgery team and had an epic secure chat with Burnard Banter, PA-C, who will admit patient on behalf of Dr. Stevie     Final diagnoses:  Post-operative  pain  Elevated LFTs    ED Discharge Orders     None          Nivia Colon, PA-C 06/04/24 1239  Suzette Pac, MD 06/04/24 [phone]

## 2024-06-04 NOTE — H&P (Signed)
 Lisa Casey 05-Feb-1961  [id number].    Chief Complaint/Reason for Consult: s/p lap chole 9/12 with elevated LFTs with epigastric/RUQ abdominal pain  HPI:  This is a pleasant 63 yo female who underwent a lap chole by Dr. Stevie on 9/12 for chronic cholecystitis.  She tolerated this well and has been doing well since surgery until 2 days ago she started having significant epigastric and RUQ abdominal pain with nausea, no vomiting.  She was taking 800mg  of ibuprofen  which initially helped, but then didn't.  She had to start taking her oxycodone  again.  She called the office and a CMET was ordered.  This revealed elevated LFTs and she was told to present to the ED for admission.  She denies any other issues except constipation which is a norm for her.  No BM since surgery despite stool softeners.  ROS: ROS: see HPI  Family History  Problem Relation Age of Onset   Breast cancer Mother 53   Breast cancer Paternal Grandmother    Heart failure Neg Hx    Coronary artery disease Neg Hx     Past Medical History:  Diagnosis Date   Abdominal pain 05/26/2022   Anxiety    HX OF ANXIETY ATTACKS   Back pain, chronic    Bronchitis    Cholelithiasis 04/15/2021   Depression    Diabetes (HCC)    pt. state pre-DM highest HgbA1c  6.4   Fibromyalgia    GERD (gastroesophageal reflux disease)    H/O total hysterectomy 07/05/2022   Cervix removed per patient   History of kidney stones    Hypertension    Migraines    HX OF MIGRAINES   Myocardial infarction [facility])    july 2022   Obesity    Pneumonia    06-2021   Shortened PR interval     Past Surgical History:  Procedure Laterality Date   CHOLECYSTECTOMY N/A 05/29/2024   Procedure: LAPAROSCOPIC CHOLECYSTECTOMY WITH ICG DYE;  Surgeon: Stevie Herlene Righter, MD;  Location: [facility];  Service: General;  Laterality: N/A;   CYSTOSCOPY W/ URETERAL STENT PLACEMENT  09/23/2012   Procedure: CYSTOSCOPY WITH RETROGRADE PYELOGRAM/URETERAL STENT  PLACEMENT;  Surgeon: Ricardo Likens, MD;  Location: [facility];  Service: Urology;  Laterality: Right;   CYSTOSCOPY WITH RETROGRADE PYELOGRAM, URETEROSCOPY AND STENT PLACEMENT  10/08/2012   Procedure: CYSTOSCOPY WITH RETROGRADE PYELOGRAM, URETEROSCOPY AND STENT PLACEMENT;  Surgeon: Ricardo Likens, MD;  Location[facility];  Service: Urology;  Laterality: Right;  90 MIN NO RETROGRADE NEEDS FLEX URETEROSCOPE    CYSTOSCOPY/URETEROSCOPY/HOLMIUM LASER/STENT PLACEMENT Right 08/21/2021   Procedure: CYSTOSCOPY RIGHT /URETEROSCOPY/HOLMIUM LASER/STENT PLACEMENT;  Surgeon: Carolee Sherwood JONETTA DOUGLAS, MD;  Location: [facility];  Service: Urology;  Laterality: Right;   ESOPHAGOGASTRODUODENOSCOPY N/A 05/08/2024   Procedure: EGD (ESOPHAGOGASTRODUODENOSCOPY);  Surgeon: Rollin Dover, MD;  Location: [facility];  Service: Gastroenterology;  Laterality: N/A;   EUS N/A 05/08/2024   Procedure: ULTRASOUND, UPPER GI TRACT, ENDOSCOPIC;  Surgeon: Rollin Dover, MD;  Location: [facility];  Service: Gastroenterology;  Laterality: N/A;   HOLMIUM LASER APPLICATION  10/08/2012   Procedure: HOLMIUM LASER APPLICATION;  Surgeon: Ricardo Likens, MD;  Location: [facility];  Service: Urology;  Laterality: Right;   LEFT HEART CATH AND CORONARY ANGIOGRAPHY N/A 04/17/2021   Procedure: LEFT HEART CATH AND CORONARY ANGIOGRAPHY;  Surgeon: Verlin Lonni JONETTA, MD;  Location: [facility];  Service: Cardiovascular;  Laterality: N/A;   LEFT HEART CATHETERIZATION WITH CORONARY ANGIOGRAM N/A  10/29/2011   Procedure: LEFT HEART CATHETERIZATION WITH CORONARY ANGIOGRAM;  Surgeon: Alm LELON Clay, MD;  Location[facility];  Service: Cardiovascular;  Laterality: N/A;   ROBOTIC ASSISTED LAP VAGINAL HYSTERECTOMY  09/14/2011   Procedure: ROBOTIC ASSISTED LAPAROSCOPIC VAGINAL HYSTERECTOMY;  Surgeon: Olam DELENA Mill, MD;  Location: [facility];  Service: Gynecology;  Laterality: N/A;   TUBAL LIGATION      Social  History:  reports that she quit smoking about 28 years ago. Her smoking use included cigarettes. She started smoking about 38 years ago. She has a 2.5 pack-year smoking history. She has never used smokeless tobacco. She reports that she does not drink alcohol and does not use drugs.  Allergies:  Allergies  Allergen Reactions   Codeine Itching and Rash   Glucophage  [Metformin ] Other (See Comments)    GI intolerance    (Not in a hospital admission)    Physical Exam: Blood pressure (!) 163/77, pulse 77, temperature 98.4 F (36.9 C), temperature source Oral, resp. rate 16, last menstrual period 08/04/2011, SpO2 99%. General: pleasant, WD, WN female who is laying in bed in NAD HEENT: head is normocephalic, atraumatic.  Sclera are noninjected.  PERRL.  Ears and nose without any masses or lesions.  Mouth is pink and moist Heart: regular, rate, and rhythm.  Normal s1,s2. No obvious murmurs, gallops, or rubs noted.   Lungs: CTAB, no wheezes, rhonchi, or rales noted.  Respiratory effort nonlabored Abd: soft, quite tender in her epigastrium and RUQ, no peritonitis, but some voluntary guarding, ND, +BS, no masses, hernias, or organomegaly.  Incisions are healing well MS: all 4 extremities are symmetrical with no cyanosis, clubbing, or edema. Psych: A&Ox3 with an appropriate affect.   Results for orders placed or performed during the hospital encounter of 06/04/24 (from the past 48 hours)  Comprehensive metabolic panel     Status: Abnormal   Collection Time: 06/04/24 12:46 PM  Result Value Ref Range   Sodium 148 (H) 135 - 145 mmol/L   Potassium 4.1 3.5 - 5.1 mmol/L   Chloride 109 98 - 111 mmol/L   CO2 24 22 - 32 mmol/L   Glucose, Bld 102 (H) 70 - 99 mg/dL    Comment: Glucose reference range applies only to samples taken after fasting for at least 8 hours.   BUN 9 8 - 23 mg/dL   Creatinine, Ser 9.28 0.44 - 1.00 mg/dL   Calcium  10.1 8.9 - 10.3 mg/dL   Total Protein 6.9 6.5 - 8.1 g/dL    Albumin 4.1 3.5 - 5.0 g/dL   AST 92 (H) 15 - 41 U/L   ALT 196 (H) 0 - 44 U/L   Alkaline Phosphatase 201 (H) 38 - 126 U/L   Total Bilirubin 2.0 (H) 0.0 - 1.2 mg/dL   GFR, Estimated >39 >39 mL/min    Comment: (NOTE) Calculated using the CKD-EPI Creatinine Equation (2021)    Anion gap 15 5 - 15    Comment: Performed at [facility], [address] [place], [place] [address]  CBC     Status: None   Collection Time: 06/04/24 12:46 PM  Result Value Ref Range   WBC 6.5 4.0 - 10.5 K/uL   RBC 4.72 3.87 - 5.11 MIL/uL   Hemoglobin 13.6 12.0 - 15.0 g/dL   HCT 56.6 63.9 - 53.9 %   MCV 91.7 80.0 - 100.0 fL   MCH 28.8 26.0 - 34.0 pg   MCHC 31.4 30.0 - 36.0 g/dL  RDW 12.6 11.5 - 15.5 %   Platelets 228 150 - 400 K/uL   nRBC 0.0 0.0 - 0.2 %    Comment: Performed at [facility], [address] [place], [place] [address]   No results found.    Assessment/Plan Elevated LFTs with abdominal pain, s/p lap chole by LK 9/12 The patient has been seen, examined, labs, and chart reviewed.  Her CT scan is currently ordered but not done.  The patient will be admitted for further work up to rule out CBD stone vs bile leak.  If CT shows a biloma, may need IR eval for drain placement to determine quantity of output to determine if further intervention warranted with ERCP.  If CT with no biloma and ductal dilatation or concern for CBD stone, then may need GI for ERCP or further eval with MRCP etc.  Labs in am.  Will keep NPO for now for further work up, but may be able to have CLD later this afternoon.  Discussed all of this with the patient who is agreeable with this plan.  Dr. Stevie is aware of patient's admission.   FEN - NPO/IVFs VTE - lovenox  tomorrow, SCDs ID - [email or web address] , empirically for now Admit - inpatient  DM - hold metformin  due to CT contrast, SSI HTN - resume home meds Anxiety - resume home meds Fibromyalgia H/o MI   I reviewed nursing notes, last 24  h vitals and pain scores, last 48 h intake and output, last 24 h labs and trends, and last 24 h imaging results.  Burnard FORBES Banter, [facility] Surgery 06/04/2024, 1:27 PM Please see Amion for pager number during day hours 7:00am-4:30pm or 7:00am -11:30am on weekends

## 2024-06-05 ENCOUNTER — Inpatient Hospital Stay (HOSPITAL_COMMUNITY)

## 2024-06-05 DIAGNOSIS — R531 Weakness: Secondary | ICD-10-CM

## 2024-06-05 DIAGNOSIS — R4182 Altered mental status, unspecified: Secondary | ICD-10-CM

## 2024-06-05 LAB — COMPREHENSIVE METABOLIC PANEL WITH GFR
ALT: 147 U/L — ABNORMAL HIGH (ref 0–44)
AST: 55 U/L — ABNORMAL HIGH (ref 15–41)
Albumin: 3.7 g/dL (ref 3.5–5.0)
Alkaline Phosphatase: 168 U/L — ABNORMAL HIGH (ref 38–126)
Anion gap: 14 (ref 5–15)
BUN: 9 mg/dL (ref 8–23)
CO2: 22 mmol/L (ref 22–32)
Calcium: 9.5 mg/dL (ref 8.9–10.3)
Chloride: 106 mmol/L (ref 98–111)
Creatinine, Ser: 0.65 mg/dL (ref 0.44–1.00)
GFR, Estimated: >60 mL/min (ref >60)
Glucose, Bld: 120 mg/dL — ABNORMAL HIGH (ref 70–99)
Potassium: 3.6 mmol/L (ref 3.5–5.1)
Sodium: 142 mmol/L (ref 135–145)
Total Bilirubin: 1.8 mg/dL — ABNORMAL HIGH (ref 0.0–1.2)
Total Protein: 6.3 g/dL — ABNORMAL LOW (ref 6.5–8.1)

## 2024-06-05 LAB — LIPASE, BLOOD: Lipase: 18 U/L (ref 11–51)

## 2024-06-05 LAB — CBC
HCT: 41.3 % (ref 36.0–46.0)
Hemoglobin: 13 g/dL (ref 12.0–15.0)
MCH: 29.1 pg (ref 26.0–34.0)
MCHC: 31.5 g/dL (ref 30.0–36.0)
MCV: 92.4 fL (ref 80.0–100.0)
Platelets: 227 K/uL (ref 150–400)
RBC: 4.47 MIL/uL (ref 3.87–5.11)
RDW: 12.6 % (ref 11.5–15.5)
WBC: 4 K/uL (ref 4.0–10.5)
nRBC: 0 % (ref 0.0–0.2)

## 2024-06-05 LAB — GLUCOSE, CAPILLARY
Glucose-Capillary: 101 mg/dL — ABNORMAL HIGH (ref 70–99)
Glucose-Capillary: 101 mg/dL — ABNORMAL HIGH (ref 70–99)
Glucose-Capillary: 95 mg/dL (ref 70–99)
Glucose-Capillary: 101 mg/dL — ABNORMAL HIGH (ref 70–99)
Glucose-Capillary: 117 mg/dL — ABNORMAL HIGH (ref 70–99)

## 2024-06-05 LAB — PHOSPHORUS: Phosphorus: 4.6 mg/dL (ref 2.5–4.6)

## 2024-06-05 LAB — PREALBUMIN: Prealbumin: 16 mg/dL — ABNORMAL LOW (ref 18–38)

## 2024-06-05 LAB — MAGNESIUM: Magnesium: 1.9 mg/dL (ref 1.7–2.4)

## 2024-06-05 LAB — TROPONIN T, HIGH SENSITIVITY: Troponin T High Sensitivity: <15 ng/L (ref 0–19)

## 2024-06-05 MED ORDER — IBUPROFEN 400 MG PO TABS
800.0000 mg | ORAL_TABLET | Freq: Three times a day (TID) | ORAL | Status: DC | PRN
  Administered 2024-06-05 – 2024-06-06 (×2): 800 mg via ORAL
  Filled 2024-06-05 (×2): qty 2

## 2024-06-05 MED ORDER — BISACODYL 10 MG RE SUPP: 10.0000 mg | Freq: Two times a day (BID) | RECTAL | Status: DC | PRN

## 2024-06-05 MED ORDER — KCL IN DEXTROSE-NACL 20-5-0.45 MEQ/L-%-% IV SOLN
INTRAVENOUS | Status: AC
  Filled 2024-06-05: qty 1000

## 2024-06-05 MED ORDER — SODIUM CHLORIDE (PF) 0.9 % IJ SOLN
INTRAMUSCULAR | Status: AC
  Filled 2024-06-05: qty 100

## 2024-06-05 MED ORDER — GADOBUTROL 1 MMOL/ML IV SOLN
8.0000 mL | Freq: Once | INTRAVENOUS | Status: AC | PRN
  Administered 2024-06-05: 8 mL via INTRAVENOUS

## 2024-06-05 MED ORDER — PHENOL 1.4 % MT LIQD: 2.0000 | OROMUCOSAL | Status: DC | PRN

## 2024-06-05 MED ORDER — METHOCARBAMOL 500 MG PO TABS
500.0000 mg | ORAL_TABLET | Freq: Three times a day (TID) | ORAL | Status: DC | PRN
  Administered 2024-06-06: 500 mg via ORAL
  Filled 2024-06-05 (×2): qty 1

## 2024-06-05 MED ORDER — LORAZEPAM 2 MG/ML IJ SOLN
1.0000 mg | Freq: Four times a day (QID) | INTRAMUSCULAR | Status: DC | PRN
  Administered 2024-06-05: 1 mg via INTRAVENOUS
  Filled 2024-06-05: qty 1

## 2024-06-05 MED ORDER — POTASSIUM CHLORIDE 2 MEQ/ML IV SOLN: INTRAVENOUS | Status: DC

## 2024-06-05 MED ORDER — PANTOPRAZOLE SODIUM 40 MG PO TBEC
40.0000 mg | DELAYED_RELEASE_TABLET | Freq: Two times a day (BID) | ORAL | Status: DC
  Administered 2024-06-05 – 2024-06-07 (×5): 40 mg via ORAL
  Filled 2024-06-05 (×5): qty 1

## 2024-06-05 MED ORDER — MAGIC MOUTHWASH: 15.0000 mL | Freq: Four times a day (QID) | ORAL | Status: DC | PRN

## 2024-06-05 MED ORDER — MENTHOL 3 MG MT LOZG: 1.0000 | LOZENGE | OROMUCOSAL | Status: DC | PRN

## 2024-06-05 MED ORDER — ALUM & MAG HYDROXIDE-SIMETH 200-200-20 MG/5ML PO SUSP: 30.0000 mL | Freq: Four times a day (QID) | ORAL | Status: DC | PRN

## 2024-06-05 NOTE — Progress Notes (Addendum)
 Code Stroke called on this patient.  Upon my arrival rapid response was present and a video monitor with the virtual neuro physician.  The RN states that patient has been anxious since she got on shift, but developed nausea in the MRI scanner and went to give her medication.  While there she noticed the patient was weak and unable to hold herself up.  Upon arrival back on the unit a code stroke was called.  The patient is oriented, although is a bit delayed  in answering questions.  She complains of both her hands and feet being numb.  She is clearly anxious and wanting to sit up and then lay down.  She is diaphoretic initially upon presentation.  She is complaining of epigastric abdominal pain, but denies CP.  She complains of SOB, but O2 sats are 100% on RA, HR 70s, BP 140/77.    Gen: anxious, unable to get comfortable and moving all around the bed Heart: regular Lungs: CTAB, sats normal on RA Abd: soft, tender in epigastrium Neuro: symmetrical smile, voluntarily able to raise BUE and hold them in place for a short period of time, moving BLE symmetrical.  Normal sensation bilaterally in face, UEs, and LEs.  Normal peripheral vision.  No other focal neurologic findings Psych: alert and oriented x3, anxious, unable to lay still  PCXR, EKG, tropinins and CT head have been ordered. PCXR by my review looks stable with no obvious acute findings, except some low lung volumes, that would explain her presentation.  EKG was NSR, previous infarct , prolonged QTc, but no ST or t-wave abnormalities.  Trops are still pending.  CT head is pending, but neuro has reviewed with no evidence for CVA or bleed.    The patient has a history of anxiety attacks and takes medication for this at home.  At this time, the patient acts more c/w this presentation than anything else.  We will give her 1mg  of Ativan  now to see if this will help, knowing she has a nonfocal neuro exam currently.  Neuro ok with this as well.   I have  tried to call her husband and her mother at her request and both have gone to voicemail at this time.  Of note, her MRCP did return positive for CBD filling defect.  We are in the process of trying to get in touch with Dr. Rollin for consultation for ERCP.    Dr. Stevie has been kept in the loop and updated throughout the process.  Lisa FORBES Banter, PA-C 12:12 PM 06/05/2024

## 2024-06-05 NOTE — Significant Event (Signed)
 Rapid Response Event Note   Reason for Call :  Weakness and altered mental status   Initial Focused Assessment & event summary:  PT a&Ox3. Diaphoretic and anxious. Spo2 100% on RA. HR 80s. 157/86 (106). Pupils equal and reactive. Bilateral weakness but able to move all extremities. Teleneuro cart activated. 12 lead EKG obtained. Doctors at bedside. Troponins, chest xray, and head CT ordered by PA.   This RN assisted primary RN to CT.         MD Notified: prior to arrival  Call Time: 1048 Arrival Time: 1055 End Time: 1145  Andriette DELENA House, RN

## 2024-06-05 NOTE — H&P (View-Only) (Signed)
 Reason for Consult:Choledocholithiasis Referring Physician: [facility]  Declynn TAIJA MATHIAS HPI: This is a 63 year old female with a PMH of HTN, MI (03/2021) GERD, fibromyalgia, DM, and anxiety was admitted to the hospital for complaints of RUQ  and epigastric pain.  She had an elective lap chole with Dr. Stevie on 05/29/2024 without any immediate complications.  With her symptoms she presented to the ER and she was noted to have elevated liver enzymes:  AST 92, ALT 196, AP 201, and TB 2.0.  Prior to this time her liver enzymes were normal, but there was a mild elevation of her TB at 1.4.  Imaging with an MRCP showed a mild intrahepatic and extrahepatic biliary ductal dilation.  There was the possibility of a 2 mm distal CBD stone.  Past Medical History:  Diagnosis Date   Abdominal pain 05/26/2022   Anxiety    HX OF ANXIETY ATTACKS   Back pain, chronic    Bronchitis    Cholelithiasis 04/15/2021   Depression    Diabetes (HCC)    pt. state pre-DM highest HgbA1c  6.4   Fibromyalgia    GERD (gastroesophageal reflux disease)    H/O total hysterectomy 07/05/2022   Cervix removed per patient   History of kidney stones    Hypertension    Migraines    HX OF MIGRAINES   Myocardial infarction [facility])    july 2022   Obesity    Pneumonia    06-2021   Shortened PR interval     Past Surgical History:  Procedure Laterality Date   CHOLECYSTECTOMY N/A 05/29/2024   Procedure: LAPAROSCOPIC CHOLECYSTECTOMY WITH ICG DYE;  Surgeon: Stevie Herlene Righter, MD;  Location: [facility];  Service: General;  Laterality: N/A;   CYSTOSCOPY W/ URETERAL STENT PLACEMENT  09/23/2012   Procedure: CYSTOSCOPY WITH RETROGRADE PYELOGRAM/URETERAL STENT PLACEMENT;  Surgeon: Ricardo Likens, MD;  Location: [facility];  Service: Urology;  Laterality: Right;   CYSTOSCOPY WITH RETROGRADE PYELOGRAM, URETEROSCOPY AND STENT PLACEMENT  10/08/2012   Procedure: CYSTOSCOPY WITH RETROGRADE PYELOGRAM, URETEROSCOPY AND STENT PLACEMENT;   Surgeon: Ricardo Likens, MD;  Location: [facility];  Service: Urology;  Laterality: Right;  90 MIN NO RETROGRADE NEEDS FLEX URETEROSCOPE    CYSTOSCOPY/URETEROSCOPY/HOLMIUM LASER/STENT PLACEMENT Right 08/21/2021   Procedure: CYSTOSCOPY RIGHT /URETEROSCOPY/HOLMIUM LASER/STENT PLACEMENT;  Surgeon: Carolee Sherwood JONETTA DOUGLAS, MD;  Location: [facility];  Service: Urology;  Laterality: Right;   ESOPHAGOGASTRODUODENOSCOPY N/A 05/08/2024   Procedure: EGD (ESOPHAGOGASTRODUODENOSCOPY);  Surgeon: Rollin Dover, MD;  Location: [facility];  Service: Gastroenterology;  Laterality: N/A;   EUS N/A 05/08/2024   Procedure: ULTRASOUND, UPPER GI TRACT, ENDOSCOPIC;  Surgeon: Rollin Dover, MD;  Location: [facility];  Service: Gastroenterology;  Laterality: N/A;   HOLMIUM LASER APPLICATION  10/08/2012   Procedure: HOLMIUM LASER APPLICATION;  Surgeon: Ricardo Likens, MD;  Location: [facility];  Service: Urology;  Laterality: Right;   LEFT HEART CATH AND CORONARY ANGIOGRAPHY N/A 04/17/2021   Procedure: LEFT HEART CATH AND CORONARY ANGIOGRAPHY;  Surgeon: Verlin Lonni JONETTA, MD;  Location: [facility];  Service: Cardiovascular;  Laterality: N/A;   LEFT HEART CATHETERIZATION WITH CORONARY ANGIOGRAM N/A 10/29/2011   Procedure: LEFT HEART CATHETERIZATION WITH CORONARY ANGIOGRAM;  Surgeon: Alm LELON Clay, MD;  Location: [facility];  Service: Cardiovascular;  Laterality: N/A;   ROBOTIC ASSISTED LAP VAGINAL HYSTERECTOMY  09/14/2011   Procedure: ROBOTIC ASSISTED LAPAROSCOPIC VAGINAL HYSTERECTOMY;  Surgeon: Olam DELENA Mill, MD;  Location: [facility];  Service: Gynecology;  Laterality: N/A;   TUBAL LIGATION      Family History  Problem Relation Age of Onset   Breast cancer Mother 49   Breast cancer Paternal Grandmother    Heart failure Neg Hx    Coronary artery disease Neg Hx     Social History:  reports that she quit smoking about 28 years ago. Her smoking use included cigarettes.  She started smoking about 38 years ago. She has a 2.5 pack-year smoking history. She has never used smokeless tobacco. She reports that she does not drink alcohol and does not use drugs.  Allergies:  Allergies  Allergen Reactions   Codeine Itching and Rash   Glucophage  [Metformin ] Other (See Comments)    GI intolerance    Medications: Scheduled:  acetaminophen   1,000 mg Oral Q6H   amLODipine   10 mg Oral Daily   enoxaparin  (LOVENOX ) injection  40 mg Subcutaneous Q24H   insulin  aspart  0-15 Units Subcutaneous TID WC   isosorbide  mononitrate  30 mg Oral QHS   pantoprazole   40 mg Oral BID AC   polyethylene glycol  17 g Oral Daily   venlafaxine   25 mg Oral BID   Continuous:  dextrose  5 % and 0.45 % NaCl with KCl 20 mEq/L 75 mL/hr at 06/05/24 1353   piperacillin -tazobactam (ZOSYN )  IV 3.375 g (06/05/24 1356)    Results for orders placed or performed during the hospital encounter of 06/04/24 (from the past 24 hours)  Glucose, capillary     Status: Abnormal   Collection Time: 06/04/24  9:54 PM  Result Value Ref Range   Glucose-Capillary 102 (H) 70 - 99 mg/dL  Comprehensive metabolic panel     Status: Abnormal   Collection Time: 06/05/24  4:35 AM  Result Value Ref Range   Sodium 142 135 - 145 mmol/L   Potassium 3.6 3.5 - 5.1 mmol/L   Chloride 106 98 - 111 mmol/L   CO2 22 22 - 32 mmol/L   Glucose, Bld 120 (H) 70 - 99 mg/dL   BUN 9 8 - 23 mg/dL   Creatinine, Ser 9.34 0.44 - 1.00 mg/dL   Calcium  9.5 8.9 - 10.3 mg/dL   Total Protein 6.3 (L) 6.5 - 8.1 g/dL   Albumin 3.7 3.5 - 5.0 g/dL   AST 55 (H) 15 - 41 U/L   ALT 147 (H) 0 - 44 U/L   Alkaline Phosphatase 168 (H) 38 - 126 U/L   Total Bilirubin 1.8 (H) 0.0 - 1.2 mg/dL   GFR, Estimated >39 >39 mL/min   Anion gap 14 5 - 15  CBC     Status: None   Collection Time: 06/05/24  4:35 AM  Result Value Ref Range   WBC 4.0 4.0 - 10.5 K/uL   RBC 4.47 3.87 - 5.11 MIL/uL   Hemoglobin 13.0 12.0 - 15.0 g/dL   HCT 58.6 63.9 - 53.9 %   MCV  92.4 80.0 - 100.0 fL   MCH 29.1 26.0 - 34.0 pg   MCHC 31.5 30.0 - 36.0 g/dL   RDW 87.3 88.4 - 84.4 %   Platelets 227 150 - 400 K/uL   nRBC 0.0 0.0 - 0.2 %  Prealbumin     Status: Abnormal   Collection Time: 06/05/24  4:35 AM  Result Value Ref Range   Prealbumin 16 (L) 18 - 38 mg/dL  Phosphorus     Status: None   Collection Time: 06/05/24  4:35 AM  Result Value Ref Range   Phosphorus 4.6  2.5 - 4.6 mg/dL  Magnesium     Status: None   Collection Time: 06/05/24  4:35 AM  Result Value Ref Range   Magnesium 1.9 1.7 - 2.4 mg/dL  Lipase, blood     Status: None   Collection Time: 06/05/24  4:35 AM  Result Value Ref Range   Lipase 18 11 - 51 U/L  Glucose, capillary     Status: Abnormal   Collection Time: 06/05/24  7:37 AM  Result Value Ref Range   Glucose-Capillary 101 (H) 70 - 99 mg/dL  Glucose, capillary     Status: Abnormal   Collection Time: 06/05/24 10:47 AM  Result Value Ref Range   Glucose-Capillary 101 (H) 70 - 99 mg/dL  Troponin T, High Sensitivity     Status: None   Collection Time: 06/05/24 11:15 AM  Result Value Ref Range   Troponin T High Sensitivity <15 0 - 19 ng/L  Glucose, capillary     Status: None   Collection Time: 06/05/24 11:45 AM  Result Value Ref Range   Glucose-Capillary 95 70 - 99 mg/dL     DG CHEST PORT 1 VIEW Result Date: 06/05/2024 EXAM: 1 VIEW XRAY OF THE CHEST 06/05/2024 11:25:00 AM COMPARISON: 08/01/2023 CLINICAL HISTORY: SOB (shortness of breath) 141880. SOB, post code stroke. FINDINGS: LUNGS AND PLEURA: Low lung volumes with bibasilar linear opacities. Possible small left pleural effusion. No pneumothorax. HEART AND MEDIASTINUM: Unchanged heart size and mediastinal contours. BONES AND SOFT TISSUES: Thoracolumbar scoliosis. No acute osseous abnormality. IMPRESSION: 1. Low lung volumes with bibasilar linear opacities. 2. Possible small left pleural effusion. Electronically signed by: Andrea Gasman MD 06/05/2024 03:05 PM EDT RP Workstation: [id number]    CT HEAD CODE STROKE WO CONTRAST Result Date: 06/05/2024 EXAM: CT HEAD WITHOUT CONTRAST 06/05/2024 11:37:25 AM TECHNIQUE: CT of the head was performed without the administration of intravenous contrast. Automated exposure control, iterative reconstruction, and/or weight based adjustment of the mA/kV was utilized to reduce the radiation dose to as low as reasonably achievable. COMPARISON: MRI head 04/15/2021 CLINICAL HISTORY: Neuro deficit, acute, stroke suspected. Nausea - dizzy. Sudden onset generalized weakness. FINDINGS: BRAIN AND VENTRICLES: No acute hemorrhage. No evidence of acute infarct. No hydrocephalus. No extra-axial collection. No mass effect or midline shift. [place] stroke program early CT (ASPECT) score: Ganglionic (caudate, internal capsule, lentiform nucleus, insula, M1-M3): 7 Supraganglionic (M4-M6): 3 Total: 10 ORBITS: No acute abnormality. SINUSES: No acute abnormality. SOFT TISSUES AND SKULL: No acute soft tissue abnormality. No skull fracture. IMPRESSION: 1. No acute intracranial abnormality. 2. ASPECT score: 10 3. Findings messaged to Dr. Germaine at 11:44AM on 06/05/24. Electronically signed by: Donnice Mania MD 06/05/2024 11:45 AM EDT RP Workstation: [id number]   MR ABDOMEN MRCP W WO CONTAST Result Date: 06/05/2024 CLINICAL DATA:  Patient with elevated liver function tests with persistent pain and nausea status post cholecystectomy EXAM: MRI ABDOMEN WITHOUT AND WITH CONTRAST (INCLUDING MRCP) TECHNIQUE: Multiplanar multisequence MR imaging of the abdomen was performed both before and after the administration of intravenous contrast. Heavily T2-weighted images of the biliary and pancreatic ducts were obtained. Post-processing was applied at the acquisition scanner with concurrent physician supervision which includes 3D reconstructions, MIPs, volume rendered images and/or shaded surface rendering. CONTRAST:  8mL GADAVIST  GADOBUTROL  1 MMOL/ML IV SOLN COMPARISON:  CT abdomen and pelvis dated  06/04/2024 FINDINGS: Lower chest: No acute findings. Hepatobiliary: No mass or other parenchymal abnormality identified. Mild intrahepatic bile duct dilation. Dilated common bile duct measures up to 11 mm. Within the dependent portion  of the downstream duct, there is a 2 mm filling defect (3:19, 7:62). Cholecystectomy. Subtle enhancement along the gallbladder fossa. Pancreas: No mass, inflammatory changes, or other parenchymal abnormality identified. Spleen:  Within normal limits in size and appearance. Adrenals/Urinary Tract: No adrenal nodules. No suspicious renal masses identified. No evidence of hydronephrosis. Stomach/Bowel: Visualized portions within the abdomen are unremarkable. Vascular/Lymphatic: No pathologically enlarged lymph nodes identified. No abdominal aortic aneurysm demonstrated. Other:  None. Musculoskeletal: No suspicious bone lesions identified. 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 Postsurgical changes in the anterior right upper quadrant abdominal wall. IMPRESSION: 1. Mild intrahepatic and extrahepatic bile duct dilation with a 2 mm filling defect in the downstream common bile duct, suspicious for choledocholithiasis. 2. Subtle enhancement along the gallbladder fossa, likely postsurgical. Electronically Signed   By: Limin  Xu M.Casey.   On: 06/05/2024 11:00   MR 3D Recon At Scanner Result Date: 06/05/2024 CLINICAL DATA:  Patient with elevated liver function tests with persistent pain and nausea status post cholecystectomy EXAM: MRI ABDOMEN WITHOUT AND WITH CONTRAST (INCLUDING MRCP) TECHNIQUE: Multiplanar multisequence MR imaging of the abdomen was performed both before and after the administration of intravenous contrast. Heavily T2-weighted images of the biliary and pancreatic ducts were obtained. Post-processing was applied at the acquisition scanner with concurrent physician supervision which includes 3D reconstructions, MIPs, volume rendered images and/or  shaded surface rendering. CONTRAST:  8mL GADAVIST  GADOBUTROL  1 MMOL/ML IV SOLN COMPARISON:  CT abdomen and pelvis dated 06/04/2024 FINDINGS: Lower chest: No acute findings. Hepatobiliary: No mass or other parenchymal abnormality identified. Mild intrahepatic bile duct dilation. Dilated common bile duct measures up to 11 mm. Within the dependent portion of the downstream duct, there is a 2 mm filling defect (3:19, 7:62). Cholecystectomy. Subtle enhancement along the gallbladder fossa. Pancreas: No mass, inflammatory changes, or other parenchymal abnormality identified. Spleen:  Within normal limits in size and appearance. Adrenals/Urinary Tract: No adrenal nodules. No suspicious renal masses identified. No evidence of hydronephrosis. Stomach/Bowel: Visualized portions within the abdomen are unremarkable. Vascular/Lymphatic: No pathologically enlarged lymph nodes identified. No abdominal aortic aneurysm demonstrated. Other:  None. Musculoskeletal: No suspicious bone lesions identified. 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 Postsurgical changes in the anterior right upper quadrant abdominal wall. IMPRESSION: 1. Mild intrahepatic and extrahepatic bile duct dilation with a 2 mm filling defect in the downstream common bile duct, suspicious for choledocholithiasis. 2. Subtle enhancement along the gallbladder fossa, likely postsurgical. Electronically Signed   By: Limin  Xu M.Casey.   On: 06/05/2024 11:00   NM HEPATOBILIARY LEAK (POST-SURGICAL) Result Date: 06/04/2024 CLINICAL DATA:  Abdominal pain, bile leak assessment EXAM: NUCLEAR MEDICINE HEPATOBILIARY IMAGING TECHNIQUE: Sequential images of the abdomen were obtained out to 60 minutes following intravenous administration of radiopharmaceutical. RADIOPHARMACEUTICALS:  8 millicuries Tc-77m  Choletec  IV COMPARISON:  CT abdomen 06/04/2024 FINDINGS: Satisfactory uptake of radiopharmaceutical from the blood pool. Biliary activity visible at 12  minutes with bowel activity visible at 18 minutes. Over the course of a 2 hour observation period, no bile leak is demonstrated. IMPRESSION: 1. Expected post cholecystectomy appearance of hepatobiliary scan, with patent biliary tree. No bile leak detected over 2 hours of observation. Electronically Signed   By: Ryan Salvage M.Casey.   On: 06/04/2024 18:07   CT ABDOMEN PELVIS W CONTRAST Result Date: 06/04/2024 CLINICAL DATA:  post op lap chole with pain and elevated LFTs, eval for biloma or retained CBD stone EXAM: CT ABDOMEN AND PELVIS WITH CONTRAST TECHNIQUE: Multidetector CT imaging  of the abdomen and pelvis was performed using the standard protocol following bolus administration of intravenous contrast. RADIATION DOSE REDUCTION: This exam was performed according to the departmental dose-optimization program which includes automated exposure control, adjustment of the mA and/or kV according to patient size and/or use of iterative reconstruction technique. CONTRAST:  OMNIPAQUE  IOHEXOL  300 MG/ML  SOLN COMPARISON:  05/06/2024 FINDINGS: Lower chest: Linear subsegmental atelectasis.  No effusions. Hepatobiliary: No focal liver abnormality is seen. Status post cholecystectomy. No biliary dilatation. No fluid collection in the gallbladder fossa or perihepatic fluid. Pancreas: No focal abnormality or ductal dilatation. Spleen: No focal abnormality.  Normal size. Adrenals/Urinary Tract: No adrenal abnormality. No focal renal abnormality. No stones or hydronephrosis. Urinary bladder is unremarkable. Stomach/Bowel: Normal appendix. Stomach, large and small bowel grossly unremarkable. Vascular/Lymphatic: No evidence of aneurysm or adenopathy. Reproductive: Prior hysterectomy.  No adnexal masses. Other: No free fluid or free air. Musculoskeletal: No acute bony abnormality. IMPRESSION: Status post cholecystectomy.  No visible complicating feature. No acute findings. Electronically Signed   By: Franky Crease M.Casey.   On:  06/04/2024 14:03    ROS:  As stated above in the HPI otherwise negative.  Blood pressure 138/70, pulse 70, temperature 98.6 F (37 C), temperature source Oral, resp. rate 18, height 5' 5 (1.651 m), weight 85.3 kg, last menstrual period 08/04/2011, SpO2 95%.    PE: Gen: NAD, Alert and Oriented HEENT:  [place]/AT, EOMI Neck: Supple, no LAD Lungs: CTA Bilaterally CV: RRR without M/G/R ABD: Soft, epigastric tenderness, +BS Ext: No C/C/E  Assessment/Plan: 1) Choledocholithiasis. 2) Elevated liver enzymes. 3) S/p Lap chole.   She is clinically stable.  No evidence of a cholangitis.  Further evaluation and treatment will be performed tomorrow with an ERCP.  Plan: 1) ERCP with stone extraction with Dr. Wilhelmenia tomorrow.  Lisa Casey 06/05/2024, 4:16 PM

## 2024-06-05 NOTE — Progress Notes (Signed)
 Patient c/o nausea, states zofran  worked but is still having some feelings of nausea. Would like to discuss with MD possible medication to help despite zofran  being ordered.

## 2024-06-05 NOTE — TOC Initial Note (Signed)
 Transition of Care [facility]) - Initial/Assessment Note    Patient Details  Name: Lisa Casey MRN: [id number] Date of Birth: 11-Apr-1961  [facility] CM/SW Contact:    Sonda Manuella Quill, RN Phone Number: 06/05/2024, 3:15 PM  Clinical Narrative:                 [place] w/ pt in room; pt said she lives at home w/ her spouse Lisa Casey [phone]); she plans to return at d/c; her husband will provide transportation; pt verified insurance/PCP; she denied SDOH risks; pt said she does not have DME, HH services, or home oxygen; TOC will follow.  Expected Discharge Plan: Home/Self Care Barriers to Discharge: Continued Medical Work up   Patient Goals and CMS Choice Patient states their goals for this hospitalization and ongoing recovery are:: home          Expected Discharge Plan and Services   Discharge Planning Services: CM Consult   Living arrangements for the past 2 months: Single Family Home                 DME Arranged: N/A DME Agency: NA       HH Arranged: NA HH Agency: NA        Prior Living Arrangements/Services Living arrangements for the past 2 months: Single Family Home Lives with:: Spouse Patient language and need for interpreter reviewed:: Yes Do you feel safe going back to the place where you live?: Yes      Need for Family Participation in Patient Care: Yes (Comment) Care giver support system in place?: Yes (comment) Current home services:  (n/a) Criminal Activity/Legal Involvement Pertinent to Current Situation/Hospitalization: No - Comment as needed  Activities of Daily Living   ADL Screening (condition at time of admission) Independently performs ADLs?: Yes (appropriate for developmental age) Is the patient deaf or have difficulty hearing?: No Does the patient have difficulty seeing, even when wearing glasses/contacts?: No Does the patient have difficulty concentrating, remembering, or making decisions?: No  Permission  Sought/Granted Permission sought to share information with : Case Manager Permission granted to share information with : Yes, Verbal Permission Granted  Share Information with NAME: Case Manager     Permission granted to share info w Relationship: Lisa Casey (spouse) [phone]     Emotional Assessment Appearance:: Appears stated age Attitude/Demeanor/Rapport: Gracious Affect (typically observed): Accepting Orientation: : Oriented to Self, Oriented to Place, Oriented to  Time, Oriented to Situation Alcohol / Substance Use: Not Applicable Psych Involvement: No (comment)  Admission diagnosis:  Post-operative pain [G89.18] Elevated LFTs [R79.89] Patient Active Problem List   Diagnosis Date Noted   Elevated LFTs 06/04/2024   COVID-19 vaccination declined 01/27/2024   Herpes zoster vaccination declined 01/27/2024   Fall 01/27/2024   History of MI (myocardial infarction) 01/27/2024   Contusion of rib on left side 07/24/2023   Bruising 07/24/2023   Need for zoster vaccination 07/24/2023   Encounter for screening mammogram for breast cancer 07/24/2023   Encounter for annual health examination 07/24/2023   Bilateral impacted cerumen 07/24/2023   Psychophysiological insomnia 07/24/2023   Wound of left leg 07/24/2023   Chronic right-sided low back pain without sciatica 11/06/2022   Feeling stressed out 11/06/2022   Constipation 11/05/2022   Family history of malignant neoplasm of digestive organs 11/05/2022   Rectal bleeding 11/05/2022   Sleep apnea 11/05/2022   H/O total hysterectomy 07/05/2022   S/P cholecystectomy 05/28/2022   Class 1 obesity due to excess calories without  serious comorbidity with body mass index (BMI) of 31.0 to 31.9 in adult 05/13/2022   Dry eyes, bilateral 05/13/2022   Atherosclerosis 05/13/2022   Diabetes mellitus (HCC) 07/19/2021   Hyperlipidemia 07/19/2021   Adjustment disorder with anxiety 05/25/2021   NSTEMI (non-ST elevated myocardial infarction)  (HCC) 04/15/2021   Fibromyalgia 04/15/2021   Headache 04/15/2021   Cholelithiasis 04/15/2021   Anxiety 04/15/2021   Prediabetes 04/07/2021   Essential hypertension 08/09/2020   Abnormal glucose 08/09/2020   Obesity (BMI 30-39.9) 08/09/2020   Trigger finger, right middle finger 06/22/2020   Bilateral foot pain 03/31/2019   Palpitations 02/23/2019   Plantar fasciitis, bilateral 02/23/2019   Chronic pain syndrome 10/27/2018   Major depression, recurrent, chronic (HCC) 09/23/2018   Leiomyoma of uterus, unspecified 09/14/2011   PCP:  Georgina Speaks, FNP Pharmacy:   [facility] [address] [place] - [address] [place] [address] Phone: [phone] Fax: [phone]     Social Drivers of Health (SDOH) Social History: SDOH Screenings   Food Insecurity: No Food Insecurity (06/05/2024)  Housing: Low Risk  (06/05/2024)  Transportation Needs: No Transportation Needs (06/05/2024)  Utilities: Not At Risk (06/05/2024)  Depression (PHQ2-9): Low Risk  (06/02/2024)  Financial Resource Strain: Low Risk  (12/27/2023)   Received from [facility]  Physical Activity: Unknown (12/27/2023)   Received from [facility]  Social Connections: Socially Integrated (12/27/2023)   Received from [facility]  Stress: Patient Declined (12/27/2023)   Received from [facility]  Tobacco Use: Medium Risk (06/04/2024)   SDOH Interventions: Food Insecurity Interventions: Intervention Not Indicated, Inpatient TOC Housing Interventions: Intervention Not Indicated, Inpatient TOC Transportation Interventions: Intervention Not Indicated, Inpatient TOC Utilities Interventions: Intervention Not Indicated, Inpatient TOC   Readmission Risk Interventions     No data to display

## 2024-06-05 NOTE — Consult Note (Signed)
 Reason for Consult:Choledocholithiasis Referring Physician: [facility]  Lisa Casey HPI: This is a 63 year old female with a PMH of HTN, MI (03/2021) GERD, fibromyalgia, DM, and anxiety was admitted to the hospital for complaints of RUQ  and epigastric pain.  She had an elective lap chole with Dr. Stevie on 05/29/2024 without any immediate complications.  With her symptoms she presented to the ER and she was noted to have elevated liver enzymes:  AST 92, ALT 196, AP 201, and TB 2.0.  Prior to this time her liver enzymes were normal, but there was a mild elevation of her TB at 1.4.  Imaging with an MRCP showed a mild intrahepatic and extrahepatic biliary ductal dilation.  There was the possibility of a 2 mm distal CBD stone.  Past Medical History:  Diagnosis Date   Abdominal pain 05/26/2022   Anxiety    HX OF ANXIETY ATTACKS   Back pain, chronic    Bronchitis    Cholelithiasis 04/15/2021   Depression    Diabetes (HCC)    pt. state pre-DM highest HgbA1c  6.4   Fibromyalgia    GERD (gastroesophageal reflux disease)    H/O total hysterectomy 07/05/2022   Cervix removed per patient   History of kidney stones    Hypertension    Migraines    HX OF MIGRAINES   Myocardial infarction [facility])    july 2022   Obesity    Pneumonia    06-2021   Shortened PR interval     Past Surgical History:  Procedure Laterality Date   CHOLECYSTECTOMY N/A 05/29/2024   Procedure: LAPAROSCOPIC CHOLECYSTECTOMY WITH ICG DYE;  Surgeon: Stevie Herlene Righter, MD;  Location: [facility];  Service: General;  Laterality: N/A;   CYSTOSCOPY W/ URETERAL STENT PLACEMENT  09/23/2012   Procedure: CYSTOSCOPY WITH RETROGRADE PYELOGRAM/URETERAL STENT PLACEMENT;  Surgeon: Ricardo Likens, MD;  Location: [facility];  Service: Urology;  Laterality: Right;   CYSTOSCOPY WITH RETROGRADE PYELOGRAM, URETEROSCOPY AND STENT PLACEMENT  10/08/2012   Procedure: CYSTOSCOPY WITH RETROGRADE PYELOGRAM, URETEROSCOPY AND STENT PLACEMENT;   Surgeon: Ricardo Likens, MD;  Location: [facility];  Service: Urology;  Laterality: Right;  90 MIN NO RETROGRADE NEEDS FLEX URETEROSCOPE    CYSTOSCOPY/URETEROSCOPY/HOLMIUM LASER/STENT PLACEMENT Right 08/21/2021   Procedure: CYSTOSCOPY RIGHT /URETEROSCOPY/HOLMIUM LASER/STENT PLACEMENT;  Surgeon: Carolee Sherwood JONETTA DOUGLAS, MD;  Location: [facility];  Service: Urology;  Laterality: Right;   ESOPHAGOGASTRODUODENOSCOPY N/A 05/08/2024   Procedure: EGD (ESOPHAGOGASTRODUODENOSCOPY);  Surgeon: Rollin Dover, MD;  Location: [facility];  Service: Gastroenterology;  Laterality: N/A;   EUS N/A 05/08/2024   Procedure: ULTRASOUND, UPPER GI TRACT, ENDOSCOPIC;  Surgeon: Rollin Dover, MD;  Location: [facility];  Service: Gastroenterology;  Laterality: N/A;   HOLMIUM LASER APPLICATION  10/08/2012   Procedure: HOLMIUM LASER APPLICATION;  Surgeon: Ricardo Likens, MD;  Location: [facility];  Service: Urology;  Laterality: Right;   LEFT HEART CATH AND CORONARY ANGIOGRAPHY N/A 04/17/2021   Procedure: LEFT HEART CATH AND CORONARY ANGIOGRAPHY;  Surgeon: Verlin Lonni JONETTA, MD;  Location: [facility];  Service: Cardiovascular;  Laterality: N/A;   LEFT HEART CATHETERIZATION WITH CORONARY ANGIOGRAM N/A 10/29/2011   Procedure: LEFT HEART CATHETERIZATION WITH CORONARY ANGIOGRAM;  Surgeon: Alm LELON Clay, MD;  Location: [facility] CATH [facility];  Service: Cardiovascular;  Laterality: N/A;   ROBOTIC ASSISTED LAP VAGINAL HYSTERECTOMY  09/14/2011   Procedure: ROBOTIC ASSISTED LAPAROSCOPIC VAGINAL HYSTERECTOMY;  Surgeon: Olam DELENA Mill, MD;  Location: [facility];  Service: Gynecology;  Laterality: N/A;   TUBAL LIGATION      Family History  Problem Relation Age of Onset   Breast cancer Mother 49   Breast cancer Paternal Grandmother    Heart failure Neg Hx    Coronary artery disease Neg Hx     Social History:  reports that she quit smoking about 28 years ago. Her smoking use included cigarettes.  She started smoking about 38 years ago. She has a 2.5 pack-year smoking history. She has never used smokeless tobacco. She reports that she does not drink alcohol and does not use drugs.  Allergies:  Allergies  Allergen Reactions   Codeine Itching and Rash   Glucophage  [Metformin ] Other (See Comments)    GI intolerance    Medications: Scheduled:  acetaminophen   1,000 mg Oral Q6H   amLODipine   10 mg Oral Daily   enoxaparin  (LOVENOX ) injection  40 mg Subcutaneous Q24H   insulin  aspart  0-15 Units Subcutaneous TID WC   isosorbide  mononitrate  30 mg Oral QHS   pantoprazole   40 mg Oral BID AC   polyethylene glycol  17 g Oral Daily   venlafaxine   25 mg Oral BID   Continuous:  dextrose  5 % and 0.45 % NaCl with KCl 20 mEq/L 75 mL/hr at 06/05/24 1353   piperacillin -tazobactam (ZOSYN )  IV 3.375 g (06/05/24 1356)    Results for orders placed or performed during the hospital encounter of 06/04/24 (from the past 24 hours)  Glucose, capillary     Status: Abnormal   Collection Time: 06/04/24  9:54 PM  Result Value Ref Range   Glucose-Capillary 102 (H) 70 - 99 mg/dL  Comprehensive metabolic panel     Status: Abnormal   Collection Time: 06/05/24  4:35 AM  Result Value Ref Range   Sodium 142 135 - 145 mmol/L   Potassium 3.6 3.5 - 5.1 mmol/L   Chloride 106 98 - 111 mmol/L   CO2 22 22 - 32 mmol/L   Glucose, Bld 120 (H) 70 - 99 mg/dL   BUN 9 8 - 23 mg/dL   Creatinine, Ser 9.34 0.44 - 1.00 mg/dL   Calcium  9.5 8.9 - 10.3 mg/dL   Total Protein 6.3 (L) 6.5 - 8.1 g/dL   Albumin 3.7 3.5 - 5.0 g/dL   AST 55 (H) 15 - 41 U/L   ALT 147 (H) 0 - 44 U/L   Alkaline Phosphatase 168 (H) 38 - 126 U/L   Total Bilirubin 1.8 (H) 0.0 - 1.2 mg/dL   GFR, Estimated >39 >39 mL/min   Anion gap 14 5 - 15  CBC     Status: None   Collection Time: 06/05/24  4:35 AM  Result Value Ref Range   WBC 4.0 4.0 - 10.5 K/uL   RBC 4.47 3.87 - 5.11 MIL/uL   Hemoglobin 13.0 12.0 - 15.0 g/dL   HCT 58.6 63.9 - 53.9 %   MCV  92.4 80.0 - 100.0 fL   MCH 29.1 26.0 - 34.0 pg   MCHC 31.5 30.0 - 36.0 g/dL   RDW 87.3 88.4 - 84.4 %   Platelets 227 150 - 400 K/uL   nRBC 0.0 0.0 - 0.2 %  Prealbumin     Status: Abnormal   Collection Time: 06/05/24  4:35 AM  Result Value Ref Range   Prealbumin 16 (L) 18 - 38 mg/dL  Phosphorus     Status: None   Collection Time: 06/05/24  4:35 AM  Result Value Ref Range   Phosphorus 4.6  2.5 - 4.6 mg/dL  Magnesium     Status: None   Collection Time: 06/05/24  4:35 AM  Result Value Ref Range   Magnesium 1.9 1.7 - 2.4 mg/dL  Lipase, blood     Status: None   Collection Time: 06/05/24  4:35 AM  Result Value Ref Range   Lipase 18 11 - 51 U/L  Glucose, capillary     Status: Abnormal   Collection Time: 06/05/24  7:37 AM  Result Value Ref Range   Glucose-Capillary 101 (H) 70 - 99 mg/dL  Glucose, capillary     Status: Abnormal   Collection Time: 06/05/24 10:47 AM  Result Value Ref Range   Glucose-Capillary 101 (H) 70 - 99 mg/dL  Troponin T, High Sensitivity     Status: None   Collection Time: 06/05/24 11:15 AM  Result Value Ref Range   Troponin T High Sensitivity <15 0 - 19 ng/L  Glucose, capillary     Status: None   Collection Time: 06/05/24 11:45 AM  Result Value Ref Range   Glucose-Capillary 95 70 - 99 mg/dL     DG CHEST PORT 1 VIEW Result Date: 06/05/2024 EXAM: 1 VIEW XRAY OF THE CHEST 06/05/2024 11:25:00 AM COMPARISON: 08/01/2023 CLINICAL HISTORY: SOB (shortness of breath) 141880. SOB, post code stroke. FINDINGS: LUNGS AND PLEURA: Low lung volumes with bibasilar linear opacities. Possible small left pleural effusion. No pneumothorax. HEART AND MEDIASTINUM: Unchanged heart size and mediastinal contours. BONES AND SOFT TISSUES: Thoracolumbar scoliosis. No acute osseous abnormality. IMPRESSION: 1. Low lung volumes with bibasilar linear opacities. 2. Possible small left pleural effusion. Electronically signed by: Andrea Gasman MD 06/05/2024 03:05 PM EDT RP Workstation: [id number]    CT HEAD CODE STROKE WO CONTRAST Result Date: 06/05/2024 EXAM: CT HEAD WITHOUT CONTRAST 06/05/2024 11:37:25 AM TECHNIQUE: CT of the head was performed without the administration of intravenous contrast. Automated exposure control, iterative reconstruction, and/or weight based adjustment of the mA/kV was utilized to reduce the radiation dose to as low as reasonably achievable. COMPARISON: MRI head 04/15/2021 CLINICAL HISTORY: Neuro deficit, acute, stroke suspected. Nausea - dizzy. Sudden onset generalized weakness. FINDINGS: BRAIN AND VENTRICLES: No acute hemorrhage. No evidence of acute infarct. No hydrocephalus. No extra-axial collection. No mass effect or midline shift. [place] stroke program early CT (ASPECT) score: Ganglionic (caudate, internal capsule, lentiform nucleus, insula, M1-M3): 7 Supraganglionic (M4-M6): 3 Total: 10 ORBITS: No acute abnormality. SINUSES: No acute abnormality. SOFT TISSUES AND SKULL: No acute soft tissue abnormality. No skull fracture. IMPRESSION: 1. No acute intracranial abnormality. 2. ASPECT score: 10 3. Findings messaged to Dr. Germaine at 11:44AM on 06/05/24. Electronically signed by: Donnice Mania MD 06/05/2024 11:45 AM EDT RP Workstation: [id number]   MR ABDOMEN MRCP W WO CONTAST Result Date: 06/05/2024 CLINICAL DATA:  Patient with elevated liver function tests with persistent pain and nausea status post cholecystectomy EXAM: MRI ABDOMEN WITHOUT AND WITH CONTRAST (INCLUDING MRCP) TECHNIQUE: Multiplanar multisequence MR imaging of the abdomen was performed both before and after the administration of intravenous contrast. Heavily T2-weighted images of the biliary and pancreatic ducts were obtained. Post-processing was applied at the acquisition scanner with concurrent physician supervision which includes 3D reconstructions, MIPs, volume rendered images and/or shaded surface rendering. CONTRAST:  8mL GADAVIST  GADOBUTROL  1 MMOL/ML IV SOLN COMPARISON:  CT abdomen and pelvis dated  06/04/2024 FINDINGS: Lower chest: No acute findings. Hepatobiliary: No mass or other parenchymal abnormality identified. Mild intrahepatic bile duct dilation. Dilated common bile duct measures up to 11 mm. Within the dependent portion  of the downstream duct, there is a 2 mm filling defect (3:19, 7:62). Cholecystectomy. Subtle enhancement along the gallbladder fossa. Pancreas: No mass, inflammatory changes, or other parenchymal abnormality identified. Spleen:  Within normal limits in size and appearance. Adrenals/Urinary Tract: No adrenal nodules. No suspicious renal masses identified. No evidence of hydronephrosis. Stomach/Bowel: Visualized portions within the abdomen are unremarkable. Vascular/Lymphatic: No pathologically enlarged lymph nodes identified. No abdominal aortic aneurysm demonstrated. Other:  None. Musculoskeletal: No suspicious bone lesions identified. 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 Postsurgical changes in the anterior right upper quadrant abdominal wall. IMPRESSION: 1. Mild intrahepatic and extrahepatic bile duct dilation with a 2 mm filling defect in the downstream common bile duct, suspicious for choledocholithiasis. 2. Subtle enhancement along the gallbladder fossa, likely postsurgical. Electronically Signed   By: Limin  Xu M.D.   On: 06/05/2024 11:00   MR 3D Recon At Scanner Result Date: 06/05/2024 CLINICAL DATA:  Patient with elevated liver function tests with persistent pain and nausea status post cholecystectomy EXAM: MRI ABDOMEN WITHOUT AND WITH CONTRAST (INCLUDING MRCP) TECHNIQUE: Multiplanar multisequence MR imaging of the abdomen was performed both before and after the administration of intravenous contrast. Heavily T2-weighted images of the biliary and pancreatic ducts were obtained. Post-processing was applied at the acquisition scanner with concurrent physician supervision which includes 3D reconstructions, MIPs, volume rendered images and/or  shaded surface rendering. CONTRAST:  8mL GADAVIST  GADOBUTROL  1 MMOL/ML IV SOLN COMPARISON:  CT abdomen and pelvis dated 06/04/2024 FINDINGS: Lower chest: No acute findings. Hepatobiliary: No mass or other parenchymal abnormality identified. Mild intrahepatic bile duct dilation. Dilated common bile duct measures up to 11 mm. Within the dependent portion of the downstream duct, there is a 2 mm filling defect (3:19, 7:62). Cholecystectomy. Subtle enhancement along the gallbladder fossa. Pancreas: No mass, inflammatory changes, or other parenchymal abnormality identified. Spleen:  Within normal limits in size and appearance. Adrenals/Urinary Tract: No adrenal nodules. No suspicious renal masses identified. No evidence of hydronephrosis. Stomach/Bowel: Visualized portions within the abdomen are unremarkable. Vascular/Lymphatic: No pathologically enlarged lymph nodes identified. No abdominal aortic aneurysm demonstrated. Other:  None. Musculoskeletal: No suspicious bone lesions identified. 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 Postsurgical changes in the anterior right upper quadrant abdominal wall. IMPRESSION: 1. Mild intrahepatic and extrahepatic bile duct dilation with a 2 mm filling defect in the downstream common bile duct, suspicious for choledocholithiasis. 2. Subtle enhancement along the gallbladder fossa, likely postsurgical. Electronically Signed   By: Limin  Xu M.D.   On: 06/05/2024 11:00   NM HEPATOBILIARY LEAK (POST-SURGICAL) Result Date: 06/04/2024 CLINICAL DATA:  Abdominal pain, bile leak assessment EXAM: NUCLEAR MEDICINE HEPATOBILIARY IMAGING TECHNIQUE: Sequential images of the abdomen were obtained out to 60 minutes following intravenous administration of radiopharmaceutical. RADIOPHARMACEUTICALS:  8 millicuries Tc-77m  Choletec  IV COMPARISON:  CT abdomen 06/04/2024 FINDINGS: Satisfactory uptake of radiopharmaceutical from the blood pool. Biliary activity visible at 12  minutes with bowel activity visible at 18 minutes. Over the course of a 2 hour observation period, no bile leak is demonstrated. IMPRESSION: 1. Expected post cholecystectomy appearance of hepatobiliary scan, with patent biliary tree. No bile leak detected over 2 hours of observation. Electronically Signed   By: Ryan Salvage M.D.   On: 06/04/2024 18:07   CT ABDOMEN PELVIS W CONTRAST Result Date: 06/04/2024 CLINICAL DATA:  post op lap chole with pain and elevated LFTs, eval for biloma or retained CBD stone EXAM: CT ABDOMEN AND PELVIS WITH CONTRAST TECHNIQUE: Multidetector CT imaging  of the abdomen and pelvis was performed using the standard protocol following bolus administration of intravenous contrast. RADIATION DOSE REDUCTION: This exam was performed according to the departmental dose-optimization program which includes automated exposure control, adjustment of the mA and/or kV according to patient size and/or use of iterative reconstruction technique. CONTRAST:  OMNIPAQUE  IOHEXOL  300 MG/ML  SOLN COMPARISON:  05/06/2024 FINDINGS: Lower chest: Linear subsegmental atelectasis.  No effusions. Hepatobiliary: No focal liver abnormality is seen. Status post cholecystectomy. No biliary dilatation. No fluid collection in the gallbladder fossa or perihepatic fluid. Pancreas: No focal abnormality or ductal dilatation. Spleen: No focal abnormality.  Normal size. Adrenals/Urinary Tract: No adrenal abnormality. No focal renal abnormality. No stones or hydronephrosis. Urinary bladder is unremarkable. Stomach/Bowel: Normal appendix. Stomach, large and small bowel grossly unremarkable. Vascular/Lymphatic: No evidence of aneurysm or adenopathy. Reproductive: Prior hysterectomy.  No adnexal masses. Other: No free fluid or free air. Musculoskeletal: No acute bony abnormality. IMPRESSION: Status post cholecystectomy.  No visible complicating feature. No acute findings. Electronically Signed   By: Franky Crease M.D.   On:  06/04/2024 14:03    ROS:  As stated above in the HPI otherwise negative.  Blood pressure 138/70, pulse 70, temperature 98.6 F (37 C), temperature source Oral, resp. rate 18, height 5' 5 (1.651 m), weight 85.3 kg, last menstrual period 08/04/2011, SpO2 95%.    PE: Gen: NAD, Alert and Oriented HEENT:  [place]/AT, EOMI Neck: Supple, no LAD Lungs: CTA Bilaterally CV: RRR without M/G/R ABD: Soft, epigastric tenderness, +BS Ext: No C/C/E  Assessment/Plan: 1) Choledocholithiasis. 2) Elevated liver enzymes. 3) S/p Lap chole.   She is clinically stable.  No evidence of a cholangitis.  Further evaluation and treatment will be performed tomorrow with an ERCP.  Plan: 1) ERCP with stone extraction with Dr. Wilhelmenia tomorrow.  Ugochukwu Chichester D 06/05/2024, 4:16 PM

## 2024-06-05 NOTE — Consult Note (Addendum)
 [facility] Telemedicine Code Stroke Consult   Requesting Provider: Kinsinger Consult Participants: Nurse, NP Location of the provider: [facility] Location of the patient: [facility]  This consult was provided via telemedicine with 2-way video and audio communication. The patient/family was informed that care would be provided in this way and agreed to receive care in this manner.    Chief Complaint: AMS  HPI: 63 year old female s/p recent lap/chole with admission for abdominal pain and elevated LFT's who was agitated and not herself with morning shift.  Went for MRI and on return was even more altered.  Some question of focal upper extremity weakness.  Code stroke called at that time.    LKW: Unclear but felt to be with night shift tnk given?: No, Outside time window IR Thrombectomy? No, Low likelihood of infarct, nondisabling symptoms Modified Rankin Scale: 0-Completely asymptomatic and back to baseline post- stroke Time of teleneurologist evaluation: 1059  Exam: Vitals:   06/05/24 0601 06/05/24 0739  BP: 120/60 139/71  Pulse: 83 69  Resp: 17 18  Temp: (!) 97.5 F (36.4 C) 98.1 F (36.7 C)  SpO2: 98% 98%    General: Agitated.  Says she thinks she is going to die  1A: Level of Consciousness - 0 1B: Ask Month and Age - 0 1C: 'Blink Eyes' & 'Squeeze Hands' - 0 2: Test Horizontal Extraocular Movements - 0 3: Test Visual Fields - 0 4: Test Facial Palsy - 0 5A: Test Left Arm Motor Drift - 2 5B: Test Right Arm Motor Drift - 2 6A: Test Left Leg Motor Drift - 2 6B: Test Right Leg Motor Drift - 2 7: Test Limb Ataxia - 0 8: Test Sensation - 0 9: Test Language/Aphasia- 0 10: Test Dysarthria - 0 11: Test Extinction/Inattention - 0 NIHSS score: 4  Weakness on exam felt to be secondary to cooperation and not true weakness.  No focality noted.    Imaging Reviewed:  Narrative & Impression  EXAM: CT HEAD WITHOUT CONTRAST 06/05/2024 11:37:25 AM   TECHNIQUE: CT of the head  was performed without the administration of intravenous contrast. Automated exposure control, iterative reconstruction, and/or weight based adjustment of the mA/kV was utilized to reduce the radiation dose to as low as reasonably achievable.   COMPARISON: MRI head 04/15/2021   CLINICAL HISTORY: Neuro deficit, acute, stroke suspected. Nausea - dizzy. Sudden onset generalized weakness.   FINDINGS:   BRAIN AND VENTRICLES: No acute hemorrhage. No evidence of acute infarct. No hydrocephalus. No extra-axial collection. No mass effect or midline shift.   [place] stroke program early CT (ASPECT) score:   Ganglionic (caudate, internal capsule, lentiform nucleus, insula, M1-M3): 7 Supraganglionic (M4-M6): 3   Total: 10   ORBITS: No acute abnormality.   SINUSES: No acute abnormality.   SOFT TISSUES AND SKULL: No acute soft tissue abnormality. No skull fracture.   IMPRESSION: 1. No acute intracranial abnormality. 2. ASPECT score: 10 3. Findings messaged to Dr. Germaine at 11:44AM on 06/05/24.    Labs reviewed in epic and pertinent values follow: CBG 101   Assessment: 63 year old female s/p recent lap/chole with admission for abdominal pain and elevated LFT's, now with altered mental status.  No focality noted on neurological examination.  Low likelihood of stroke at this time.  Head CT shows no evidence of infarct.  Patient also complaining of difficulty breathing.  Further medical work up to be initiated.    Recommendations:  May give ASA 325mg  after patient passes bedside swallow  screen Agree with continued medical work up.      This patient is receiving care for possible acute neurological changes. Care by this provider at the time of service, included time for direct evaluation via telemedicine, review of medical records, imaging studies and discussion of findings with providers, the patient and/or family.  Sonny Hock MD [facility] [phone]  If  8pm- 8am, please page neurology on call as listed in AMION.

## 2024-06-05 NOTE — Plan of Care (Signed)

## 2024-06-05 NOTE — Progress Notes (Signed)
      Chief Complaint/Subjective: Worsened pain overnight, writhing this morning. HIDA without leak or blockage  Objective: Vital signs in last 24 hours: Temp:  [97.5 F (36.4 C)-98.4 F (36.9 C)] 98.1 F (36.7 C) (09/19 0739) Pulse Rate:  [55-83] 69 (09/19 0739) Resp:  [16-18] 18 (09/19 0739) BP: (114-165)/(60-89) 139/71 (09/19 0739) SpO2:  [96 %-99 %] 98 % (09/19 0739) Weight:  [85.3 kg] 85.3 kg (09/18 1444) Last BM Date : 05/27/24 Intake/Output from previous day: 09/18 0701 - 09/19 0700 In: 1069.5 [P.O.:480; I.V.:489.5; IV Piggyback:100] Out: 150 [Urine:150]  PE: Gen: anxious, uncomfortabl Resp: nonlabored Card: RRR Abd: incisions c/d/I, tender in epigastrium  Lab Results:  Recent Labs    06/04/24 1246 06/05/24 0435  WBC 6.5 4.0  HGB 13.6 13.0  HCT 43.3 41.3  PLT 228 227   Recent Labs    06/04/24 1246 06/05/24 0435  NA 148* 142  K 4.1 3.6  CL 109 106  CO2 24 22  GLUCOSE 102* 120*  BUN 9 9  CREATININE 0.71 0.65  CALCIUM  10.1 9.5   No results for input(s): LABPROT, INR in the last 72 hours.    Component Value Date/Time   NA 142 06/05/2024 0435   NA 142 01/27/2024 1117   K 3.6 06/05/2024 0435   CL 106 06/05/2024 0435   CO2 22 06/05/2024 0435   GLUCOSE 120 (H) 06/05/2024 0435   BUN 9 06/05/2024 0435   BUN 15 01/27/2024 1117   CREATININE 0.65 06/05/2024 0435   CALCIUM  9.5 06/05/2024 0435   PROT 6.3 (L) 06/05/2024 0435   PROT 6.8 07/24/2023 1206   ALBUMIN 3.7 06/05/2024 0435   ALBUMIN 4.5 07/24/2023 1206   AST 55 (H) 06/05/2024 0435   ALT 147 (H) 06/05/2024 0435   ALKPHOS 168 (H) 06/05/2024 0435   BILITOT 1.8 (H) 06/05/2024 0435   BILITOT 0.8 07/24/2023 1206   GFRNONAA >60 06/05/2024 0435   GFRAA 61 08/09/2020 1638    Assessment/Plan S/p lap chole  POD 7. Readmitted for LFT elevation. CT normal except for ? Of CBD dilation, HIDA without leak or blockage. LFTs downtrending slightly today bili 1.8 from 2.0. Due to persistent pain will get  MRI to further evaluate CBD   FEN - reg diet VTE - lovenox  ID - zosyn  9/18--> Disposition - inpatient, multimodal pain control   LOS: 1 day   I reviewed last 24 h vitals and pain scores, last 48 h intake and output, last 24 h labs and trends, and last 24 h imaging results.  This care required moderate level of medical decision making.   [facility] 06/05/2024, 7:47 AM Please see Amion for pager number during day hours 7:00am-4:30pm or 7:00am -11:30am on weekends

## 2024-06-05 NOTE — Progress Notes (Signed)
 IVT to bedside in response to code stoke. 20g in RAC flushes well with good blood return, less than 24 hours old. Please re-consult if additional access needed

## 2024-06-06 ENCOUNTER — Encounter (HOSPITAL_COMMUNITY)
Admission: EM | Disposition: A | Discharge status: Home / Self Care | Payer: Self-pay | Source: Ambulatory Visit | Attending: General Surgery

## 2024-06-06 ENCOUNTER — Inpatient Hospital Stay (HOSPITAL_COMMUNITY): Payer: Self-pay | Admitting: Anesthesiology

## 2024-06-06 ENCOUNTER — Inpatient Hospital Stay (HOSPITAL_COMMUNITY)

## 2024-06-06 ENCOUNTER — Encounter (HOSPITAL_COMMUNITY): Payer: Self-pay | Admitting: General Surgery

## 2024-06-06 DIAGNOSIS — Z9049 Acquired absence of other specified parts of digestive tract: Secondary | ICD-10-CM

## 2024-06-06 DIAGNOSIS — K838 Other specified diseases of biliary tract: Secondary | ICD-10-CM

## 2024-06-06 DIAGNOSIS — K5710 Diverticulosis of small intestine without perforation or abscess without bleeding: Secondary | ICD-10-CM

## 2024-06-06 DIAGNOSIS — K2970 Gastritis, unspecified, without bleeding: Secondary | ICD-10-CM

## 2024-06-06 DIAGNOSIS — Z9889 Other specified postprocedural states: Secondary | ICD-10-CM

## 2024-06-06 DIAGNOSIS — K8050 Calculus of bile duct without cholangitis or cholecystitis without obstruction: Secondary | ICD-10-CM

## 2024-06-06 DIAGNOSIS — R935 Abnormal findings on diagnostic imaging of other abdominal regions, including retroperitoneum: Secondary | ICD-10-CM

## 2024-06-06 DIAGNOSIS — K3189 Other diseases of stomach and duodenum: Secondary | ICD-10-CM

## 2024-06-06 HISTORY — PX: ERCP: SHX5425

## 2024-06-06 LAB — COMPREHENSIVE METABOLIC PANEL WITH GFR
ALT: 110 U/L — ABNORMAL HIGH (ref 0–44)
AST: 39 U/L (ref 15–41)
Albumin: 3.7 g/dL (ref 3.5–5.0)
Alkaline Phosphatase: 153 U/L — ABNORMAL HIGH (ref 38–126)
Anion gap: 14 (ref 5–15)
BUN: 9 mg/dL (ref 8–23)
CO2: 21 mmol/L — ABNORMAL LOW (ref 22–32)
Calcium: 9.5 mg/dL (ref 8.9–10.3)
Chloride: 104 mmol/L (ref 98–111)
Creatinine, Ser: 0.68 mg/dL (ref 0.44–1.00)
GFR, Estimated: >60 mL/min (ref >60)
Glucose, Bld: 103 mg/dL — ABNORMAL HIGH (ref 70–99)
Potassium: 3.6 mmol/L (ref 3.5–5.1)
Sodium: 139 mmol/L (ref 135–145)
Total Bilirubin: 1.4 mg/dL — ABNORMAL HIGH (ref 0.0–1.2)
Total Protein: 6.5 g/dL (ref 6.5–8.1)

## 2024-06-06 LAB — PROTIME-INR
INR: 1 (ref 0.8–1.2)
Prothrombin Time: 14.2 s (ref 11.4–15.2)

## 2024-06-06 LAB — GLUCOSE, CAPILLARY
Glucose-Capillary: 132 mg/dL — ABNORMAL HIGH (ref 70–99)
Glucose-Capillary: 131 mg/dL — ABNORMAL HIGH (ref 70–99)
Glucose-Capillary: 115 mg/dL — ABNORMAL HIGH (ref 70–99)

## 2024-06-06 SURGERY — ERCP, WITH INTERVENTION IF INDICATED
Anesthesia: General

## 2024-06-06 MED ORDER — SUGAMMADEX SODIUM 200 MG/2ML IV SOLN
INTRAVENOUS | Status: DC | PRN
  Administered 2024-06-06: 200 mg via INTRAVENOUS

## 2024-06-06 MED ORDER — FENTANYL CITRATE (PF) 100 MCG/2ML IJ SOLN
INTRAMUSCULAR | Status: AC
  Filled 2024-06-06: qty 2

## 2024-06-06 MED ORDER — PROPOFOL 10 MG/ML IV BOLUS
INTRAVENOUS | Status: DC | PRN
  Administered 2024-06-06: 170 mg via INTRAVENOUS

## 2024-06-06 MED ORDER — GLUCAGON HCL RDNA (DIAGNOSTIC) 1 MG IJ SOLR
INTRAMUSCULAR | Status: AC
Start: 2024-06-06 — End: 2024-06-06
  Filled 2024-06-06: qty 1

## 2024-06-06 MED ORDER — AMISULPRIDE (ANTIEMETIC) 5 MG/2ML IV SOLN
INTRAVENOUS | Status: AC
Start: 2024-06-06 — End: 2024-06-06
  Filled 2024-06-06: qty 4

## 2024-06-06 MED ORDER — ONDANSETRON HCL 4 MG/2ML IJ SOLN
INTRAMUSCULAR | Status: DC | PRN
  Administered 2024-06-06: 4 mg via INTRAVENOUS

## 2024-06-06 MED ORDER — DICLOFENAC SUPPOSITORY 100 MG
RECTAL | Status: AC
  Filled 2024-06-06: qty 1

## 2024-06-06 MED ORDER — SODIUM CHLORIDE 0.9 % IV SOLN: INTRAVENOUS | Status: DC

## 2024-06-06 MED ORDER — FENTANYL CITRATE (PF) 100 MCG/2ML IJ SOLN: 25.0000 ug | INTRAMUSCULAR | Status: DC | PRN

## 2024-06-06 MED ORDER — FENTANYL CITRATE (PF) 250 MCG/5ML IJ SOLN
INTRAMUSCULAR | Status: DC | PRN
  Administered 2024-06-06: 50 ug via INTRAVENOUS

## 2024-06-06 MED ORDER — DICLOFENAC SUPPOSITORY 100 MG
RECTAL | Status: DC | PRN
Start: 2024-06-06 — End: 2024-06-06
  Administered 2024-06-06: 100 mg via RECTAL

## 2024-06-06 MED ORDER — AMISULPRIDE (ANTIEMETIC) 5 MG/2ML IV SOLN
10.0000 mg | Freq: Once | INTRAVENOUS | Status: AC | PRN
  Administered 2024-06-06: 10 mg via INTRAVENOUS

## 2024-06-06 MED ORDER — ROCURONIUM BROMIDE 10 MG/ML (PF) SYRINGE
PREFILLED_SYRINGE | INTRAVENOUS | Status: DC | PRN
  Administered 2024-06-06: 50 mg via INTRAVENOUS

## 2024-06-06 MED ORDER — DEXAMETHASONE SODIUM PHOSPHATE 10 MG/ML IJ SOLN
INTRAMUSCULAR | Status: DC | PRN
  Administered 2024-06-06: 10 mg via INTRAVENOUS

## 2024-06-06 MED ORDER — LACTATED RINGERS IV SOLN
INTRAVENOUS | Status: AC | PRN
Start: 2024-06-06 — End: 2024-06-06
  Administered 2024-06-06: 1000 mL via INTRAVENOUS

## 2024-06-06 MED ORDER — GLUCAGON HCL RDNA (DIAGNOSTIC) 1 MG IJ SOLR
INTRAMUSCULAR | Status: DC | PRN
  Administered 2024-06-06 (×3): .25 mg via INTRAVENOUS

## 2024-06-06 MED ORDER — PHENYLEPHRINE 80 MCG/ML (10ML) SYRINGE FOR IV PUSH (FOR BLOOD PRESSURE SUPPORT)
PREFILLED_SYRINGE | INTRAVENOUS | Status: DC | PRN
  Administered 2024-06-06 (×2): 240 ug via INTRAVENOUS
  Administered 2024-06-06 (×3): 160 ug via INTRAVENOUS
  Administered 2024-06-06: 240 ug via INTRAVENOUS

## 2024-06-06 MED ORDER — LIDOCAINE 2% (20 MG/ML) 5 ML SYRINGE
INTRAMUSCULAR | Status: DC | PRN
  Administered 2024-06-06: 60 mg via INTRAVENOUS

## 2024-06-06 MED ORDER — ENOXAPARIN SODIUM 40 MG/0.4ML IJ SOSY
40.0000 mg | PREFILLED_SYRINGE | INTRAMUSCULAR | Status: DC
  Administered 2024-06-07: 40 mg via SUBCUTANEOUS
  Filled 2024-06-06 (×2): qty 0.4

## 2024-06-06 NOTE — Progress Notes (Signed)
*   Day of Surgery *   Chief Complaint/Subjective: S/p ERCP and sludge removal  Objective: Vital signs in last 24 hours: Temp:  [97.7 F (36.5 C)-99 F (37.2 C)] 98.4 F (36.9 C) (09/20 1111) Pulse Rate:  [67-91] 81 (09/20 1111) Resp:  [13-21] 16 (09/20 1111) BP: (132-164)/(65-86) 138/70 (09/20 1111) SpO2:  [94 %-99 %] 99 % (09/20 1111) Weight:  [85.3 kg] 85.3 kg (09/20 0714) Last BM Date : 05/27/24 Intake/Output from previous day: 09/19 0701 - 09/20 0700 In: 1557.5 [P.O.:190; I.V.:1220.5; IV Piggyback:147] Out: -   PE: Gen: anxious, uncomfortabl Resp: nonlabored Abd: incisions c/d/I, tender in epigastrium  Lab Results:  Recent Labs    06/04/24 1246 06/05/24 0435  WBC 6.5 4.0  HGB 13.6 13.0  HCT 43.3 41.3  PLT 228 227   Recent Labs    06/05/24 0435 06/06/24 0506  NA 142 139  K 3.6 3.6  CL 106 104  CO2 22 21*  GLUCOSE 120* 103*  BUN 9 9  CREATININE 0.65 0.68  CALCIUM  9.5 9.5   Recent Labs    06/06/24 0506  LABPROT 14.2  INR 1.0      Component Value Date/Time   NA 139 06/06/2024 0506   NA 142 01/27/2024 1117   K 3.6 06/06/2024 0506   CL 104 06/06/2024 0506   CO2 21 (L) 06/06/2024 0506   GLUCOSE 103 (H) 06/06/2024 0506   BUN 9 06/06/2024 0506   BUN 15 01/27/2024 1117   CREATININE 0.68 06/06/2024 0506   CALCIUM  9.5 06/06/2024 0506   PROT 6.5 06/06/2024 0506   PROT 6.8 07/24/2023 1206   ALBUMIN 3.7 06/06/2024 0506   ALBUMIN 4.5 07/24/2023 1206   AST 39 06/06/2024 0506   ALT 110 (H) 06/06/2024 0506   ALKPHOS 153 (H) 06/06/2024 0506   BILITOT 1.4 (H) 06/06/2024 0506   BILITOT 0.8 07/24/2023 1206   GFRNONAA >60 06/06/2024 0506   GFRAA 61 08/09/2020 1638    Assessment/Plan S/p lap chole  POD 8. Readmitted for LFT elevation. CT normal except for ? Of CBD dilation, HIDA without leak or blockage. LFTs downtrending, MRCP concerning for CBD obstruction, ERCP today: sludge removed and temp stent placed. PD cannulated   FEN - reg diet as  tolerated VTE - lovenox  help post procedure ID - [email or web address]  9/18--> Disposition - inpatient, multimodal pain control   LOS: 2 days   I reviewed last 24 h vitals and pain scores, last 48 h intake and output, last 24 h labs and trends, and last 24 h imaging results.  Bernarda JAYSON Ned, MD  Colorectal and General Surgery [facility]

## 2024-06-06 NOTE — Op Note (Addendum)
 [facility] Patient Name: Lisa Casey Procedure Date: 06/06/2024 MRN: [id number] Attending MD: Aloha Finner , MD, [id number] Date of Birth: 1960/10/13 CSN: [id number] Age: 63 Admit Type: Inpatient Procedure:                ERCP Indications:              Bile duct stone(s), Abnormal MRCP, Elevated liver                            enzymes, Postop exam: Cholecystectomy Providers:                Aloha Finner, MD, Randall Lines, RN, Lorrayne Kitty, Technician Referring MD:              Medicines:                General Anesthesia, Diclofenac  100 mg rectal,                            Glucagon  0.5 mg IV, antibiotics not administered as                            she is already on scheduled dosing Complications:            No immediate complications. Estimated Blood Loss:     Estimated blood loss was minimal. Procedure:                Pre-Anesthesia Assessment:                           - Prior to the procedure, a History and Physical                            was performed, and patient medications and                            allergies were reviewed. The patient's tolerance of                            previous anesthesia was also reviewed. The risks                            and benefits of the procedure and the sedation                            options and risks were discussed with the patient.                            All questions were answered, and informed consent                            was obtained. Prior Anticoagulants: The patient has  taken Lovenox  (enoxaparin ), last dose was 1 day                            prior to procedure. ASA Grade Assessment: III - A                            patient with severe systemic disease. After                            reviewing the risks and benefits, the patient was                            deemed in satisfactory condition to undergo the                             procedure.                           After obtaining informed consent, the scope was                            passed under direct vision. Throughout the                            procedure, the patient's blood pressure, pulse, and                            oxygen saturations were monitored continuously. The                            [employer] single use                            duodenoscope was introduced through the mouth, and                            used to inject contrast into and used to inject                            contrast into the bile duct and ventral pancreatic                            duct. The ERCP was accomplished without difficulty.                            The patient tolerated the procedure. Scope In: Scope Out: Findings:      The scout film was normal.      The upper GI tract was traversed under direct vision without detailed       examination. Patchy mildly erythematous mucosa without bleeding was       found in the entire examined stomach - biopsied for H. pylori       assessment. No gross lesions were noted in the duodenal bulb, in the       first  portion of the duodenum and in the second portion of the duodenum.       The major papilla was adjacent to a diverticulum. The major papilla had       an elongated intraduodenal portion. The major papilla orifice itself was       small.      Repeated attempts at biliary cannulation were not successful while using       a wire-guided approach. This led to placement of the wire within the       pancreatic duct. Decision was made to pursue a double-wire approach. The       0.035 inch Hydra Jagwire was left within the pancreatic duct.      A second 0.035 inch x 260 cm straight Hydra Jagwire was passed into the       biliary tree. The short-nosed traction sphincterotome was passed over       the guidewire and the bile duct was then deeply cannulated. Contrast was        injected. I personally interpreted the bile duct images. Ductal flow of       contrast was adequate. Image quality was adequate. Contrast extended to       the hepatic ducts. Opacification of the entire biliary tree except for       the gallbladder was successful. The main bile duct was mildly dilated.       The largest diameter was 12 mm. A 7 mm biliary sphincterotomy was made       with a monofilament traction (standard) sphincterotome using ERBE       electrocautery. There was self limited oozing from the sphincterotomy       which did not require treatment other than time. To discover objects,       the biliary tree was swept with a retrieval balloon. Sludge was swept       from the duct. An occlusion cholangiogram was performed that showed no       further significant biliary pathology.      One 4 Fr by 7 cm temporary plastic pancreatic stent with a single       external pigtail was placed into the ventral pancreatic duct. The stent       was in good position. A formal pancreatogram was not performed.      The duodenoscope was withdrawn from the patient. Impression:               - Erythematous mucosa in the stomach. Biopsied.                           - No gross lesions in the duodenal bulb, in the                            first portion of the duodenum and in the second                            portion of the duodenum.                           - The major papilla was adjacent to a diverticulum.                           - The  major papilla had an elongated intraduodenal                            portion. Orifice itself was small.                           - Double wire technique required for biliary                            cannulation.                           - The entire main bile duct was mildly dilated.                           - A biliary sphincterotomy was performed.                           - The biliary tree was swept and sludge was found.                            - One temporary plastic pancreatic stent was placed                            into the ventral pancreatic duct to decrease PEP. Moderate Sedation:      Not Applicable - Patient had care per Anesthesia. Recommendation:           - The patient will be observed post-procedure,                            until all discharge criteria are met.                           - Return patient to hospital ward for ongoing care.                           - Advance diet as tolerated.                           - Check liver enzymes (AST, ALT, alkaline                            phosphatase, bilirubin) in the morning.                           - Observe patient's clinical course.                           - Await path results.                           - Watch for pancreatitis, bleeding, perforation,                            and cholangitis.                           -  Would hold chemical VTE prophylaxis for 24 hours                            to decrease risk of post-interventional bleeding.                            If anticoagulation is necessary consider heparin                             drip without bolus in 6-12 hours and monitor                            closely.                           - Patient will need a KUB 2-view in 10-14 days to                            ensure pancreatic stent has migrated successfully.                            If still present at that time will need to be                            scheduled for EGD with stent pull.                           - [facility] will follow over the weekend and                            patient on Monday with return to Dr. Mann/Dr. Rollin                            service.                           - Antibiotics per surgical service.                           - The findings and recommendations were discussed                            with the patient.                           - The findings and recommendations were discussed                             with the referring physician. Procedure Code(s):        --- Professional ---                           [phone], Endoscopic retrograde  cholangiopancreatography (ERCP); with placement of                            endoscopic stent into biliary or pancreatic duct,                            including pre- and post-dilation and guide wire                            passage, when performed, including sphincterotomy,                            when performed, each stent                           43264, Endoscopic retrograde                            cholangiopancreatography (ERCP); with removal of                            calculi/debris from biliary/pancreatic duct(s)                           43262, 59, Endoscopic retrograde                            cholangiopancreatography (ERCP); with                            sphincterotomy/papillotomy                           74328, 26, Endoscopic catheterization of the                            biliary ductal system, radiological supervision and                            interpretation Diagnosis Code(s):        --- Professional ---                           K31.89, Other diseases of stomach and duodenum                           K83.8, Other specified diseases of biliary tract                           K80.50, Calculus of bile duct without cholangitis                            or cholecystitis without obstruction                           R74.8, Abnormal levels of other serum enzymes  Z09, Encounter for follow-up examination after                            completed treatment for conditions other than                            malignant neoplasm                           Z90.49, Acquired absence of other specified parts                            of digestive tract                           R93.2, Abnormal findings on diagnostic imaging of                            liver  and biliary tract CPT copyright 2022 [employer]. All rights reserved. The codes documented in this report are preliminary and upon coder review may  be revised to meet current compliance requirements. Aloha Finner, MD 06/06/2024 8:35:46 AM Number of Addenda: 0

## 2024-06-06 NOTE — Anesthesia Postprocedure Evaluation (Signed)
 Anesthesia Post Note  Patient: Lisa Casey  Procedure(s) Performed: ERCP, WITH INTERVENTION IF INDICATED     Patient location during evaluation: PACU Anesthesia Type: General Level of consciousness: awake and alert Pain management: pain level controlled Vital Signs Assessment: post-procedure vital signs reviewed and stable Respiratory status: spontaneous breathing, nonlabored ventilation, respiratory function stable and patient connected to nasal cannula oxygen Cardiovascular status: blood pressure returned to baseline and stable Postop Assessment: no apparent nausea or vomiting Anesthetic complications: no   No notable events documented.  Last Vitals:  Vitals:   06/06/24 0920 06/06/24 1013  BP: 132/70 (!) 149/65  Pulse: 67 68  Resp: 13 14  Temp:  36.9 C  SpO2: 94% 98%    Last Pain:  Vitals:   06/06/24 1013  TempSrc: Oral  PainSc:                  Siegfried Vieth E

## 2024-06-06 NOTE — Interval H&P Note (Signed)
 History and Physical Interval Note:  06/06/2024 7:08 AM  Lisa Casey  has presented today for surgery, with the diagnosis of Choledocholithiasis.  The various methods of treatment have been discussed with the patient and family. After consideration of risks, benefits and other options for treatment, the patient has consented to  Procedure(s): ERCP, WITH INTERVENTION IF INDICATED (N/A) as a surgical intervention.  The patient's history has been reviewed, patient examined, no change in status, stable for surgery.  I have reviewed the patient's chart and labs.  Questions were answered to the patient's satisfaction.     The risks of an ERCP were discussed at length, including but not limited to the risk of perforation, bleeding, abdominal pain, post-ERCP pancreatitis (while usually mild can be severe and even life threatening).   The risks of an EUS including intestinal perforation, bleeding, infection, aspiration, and medication effects were discussed as was the possibility it may not give a definitive diagnosis if a biopsy is performed.  When a biopsy of the pancreas is done as part of the EUS, there is an additional risk of pancreatitis at the rate of about 1-2%.  It was explained that procedure related pancreatitis is typically mild, although it can be severe and even life threatening, which is why we do not perform random pancreatic biopsies and only biopsy a lesion/area we feel is concerning enough to warrant the risk.    Naseem Varden Mansouraty Jr

## 2024-06-06 NOTE — Anesthesia Procedure Notes (Signed)
 Procedure Name: Intubation Date/Time: 06/06/2024 7:34 AM  Performed by: Claudene Hoy CROME, CRNAPre-anesthesia Checklist: Patient identified, Emergency Drugs available, Suction available and Patient being monitored Patient Re-evaluated:Patient Re-evaluated prior to induction Oxygen Delivery Method: Circle System Utilized Preoxygenation: Pre-oxygenation with 100% oxygen Induction Type: IV induction Ventilation: Mask ventilation without difficulty Laryngoscope Size: Mac and 3 Grade View: Grade I Tube type: Oral Tube size: 7.0 mm Number of attempts: 1 Airway Equipment and Method: Stylet Placement Confirmation: ETT inserted through vocal cords under direct vision, positive ETCO2 and breath sounds checked- equal and bilateral Secured at: 22 cm Tube secured with: Tape Dental Injury: Teeth and Oropharynx as per pre-operative assessment

## 2024-06-06 NOTE — Transfer of Care (Signed)
 Immediate Anesthesia Transfer of Care Note  Patient: Lisa Casey  Procedure(s) Performed: ERCP, WITH INTERVENTION IF INDICATED  Patient Location: PACU  Anesthesia Type:General  Level of Consciousness: drowsy and patient cooperative  Airway & Oxygen Therapy: Patient Spontanous Breathing  Post-op Assessment: Report given to RN and Post -op Vital signs reviewed and stable  Post vital signs: Reviewed and stable  Last Vitals:  Vitals Value Taken Time  BP 154/65 06/06/24 08:40  Temp 36.8 C 06/06/24 08:40  Pulse 92 06/06/24 08:41  Resp 24 06/06/24 08:41  SpO2 96 % 06/06/24 08:41  Vitals shown include unfiled device data.  Last Pain:  Vitals:   06/06/24 0840  TempSrc: Temporal  PainSc:       Patients Stated Pain Goal: 5 (06/06/24 0714)  Complications: No notable events documented.

## 2024-06-06 NOTE — Anesthesia Preprocedure Evaluation (Signed)
 Anesthesia Evaluation  Patient identified by MRN, date of birth, ID band Patient awake    Reviewed: Allergy & Precautions, NPO status , Patient's Chart, lab work & pertinent test results  Airway Mallampati: II  TM Distance: >3 FB Neck ROM: Full    Dental  (+) Dental Advisory Given   Pulmonary sleep apnea and Continuous Positive Airway Pressure Ventilation , former smoker   breath sounds clear to auscultation       Cardiovascular hypertension, Pt. on medications  Rhythm:Regular Rate:Normal     Neuro/Psych  Headaches  Neuromuscular disease    GI/Hepatic Neg liver ROS,GERD  ,,  Endo/Other  diabetes, Type 2    Renal/GU negative Renal ROS     Musculoskeletal  (+)  Fibromyalgia -  Abdominal   Peds  Hematology negative hematology ROS (+)   Anesthesia Other Findings   Reproductive/Obstetrics                              Anesthesia Physical Anesthesia Plan  ASA: 2  Anesthesia Plan: General   Post-op Pain Management: Minimal or no pain anticipated   Induction: Intravenous  PONV Risk Score and Plan: 3 and Ondansetron  and Dexamethasone   Airway Management Planned: Oral ETT  Additional Equipment:   Intra-op Plan:   Post-operative Plan: Extubation in OR  Informed Consent: I have reviewed the patients History and Physical, chart, labs and discussed the procedure including the risks, benefits and alternatives for the proposed anesthesia with the patient or authorized representative who has indicated his/her understanding and acceptance.     Dental advisory given  Plan Discussed with: CRNA  Anesthesia Plan Comments:         Anesthesia Quick Evaluation

## 2024-06-07 DIAGNOSIS — R7989 Other specified abnormal findings of blood chemistry: Secondary | ICD-10-CM

## 2024-06-07 LAB — COMPREHENSIVE METABOLIC PANEL WITH GFR
ALT: 87 U/L — ABNORMAL HIGH (ref 0–44)
AST: 30 U/L (ref 15–41)
Albumin: 3.8 g/dL (ref 3.5–5.0)
Alkaline Phosphatase: 137 U/L — ABNORMAL HIGH (ref 38–126)
Anion gap: 15 (ref 5–15)
BUN: 9 mg/dL (ref 8–23)
CO2: 21 mmol/L — ABNORMAL LOW (ref 22–32)
Calcium: 9.6 mg/dL (ref 8.9–10.3)
Chloride: 104 mmol/L (ref 98–111)
Creatinine, Ser: 0.8 mg/dL (ref 0.44–1.00)
GFR, Estimated: >60 mL/min (ref >60)
Glucose, Bld: 119 mg/dL — ABNORMAL HIGH (ref 70–99)
Potassium: 3.5 mmol/L (ref 3.5–5.1)
Sodium: 141 mmol/L (ref 135–145)
Total Bilirubin: 1.1 mg/dL (ref 0.0–1.2)
Total Protein: 6.4 g/dL — ABNORMAL LOW (ref 6.5–8.1)

## 2024-06-07 LAB — GLUCOSE, CAPILLARY: Glucose-Capillary: 109 mg/dL — ABNORMAL HIGH (ref 70–99)

## 2024-06-07 NOTE — Discharge Instructions (Signed)
LAPAROSCOPIC SURGERY: POST OP INSTRUCTIONS  DIET: Follow a light bland diet the first 24 hours after arrival home, such as soup, liquids, crackers, etc.  Be sure to include lots of fluids daily.  Avoid fast food or heavy meals as your are more likely to get nauseated.  Eat a low fat the next few days after surgery.   Take your usually prescribed home medications unless otherwise directed. PAIN CONTROL: Pain is best controlled by a usual combination of three different methods TOGETHER: Ice/Heat Over the counter pain medication Prescription pain medication Most patients will experience some swelling and bruising around the incisions.  Ice packs or heating pads (30-60 minutes up to 6 times a day) will help. Use ice for the first few days to help decrease swelling and bruising, then switch to heat to help relax tight/sore spots and speed recovery.  Some people prefer to use ice alone, heat alone, alternating between ice & heat.  Experiment to what works for you.  Swelling and bruising can take several weeks to resolve.   It is helpful to take an over-the-counter pain medication regularly for the first few weeks.  Choose one of the following that works best for you: Naproxen (Aleve, etc)  Two 220mg tabs twice a day Ibuprofen (Advil, etc) Three 200mg tabs four times a day (every meal & bedtime) A  prescription for pain medication (such as percocet, vicodin, oxycodone, hydrocodone, etc) should be given to you upon discharge.  Take your pain medication as prescribed.  If you are having problems/concerns with the prescription medicine (does not control pain, nausea, vomiting, rash, itching, etc), please call us [phone] to see if we need to switch you to a different pain medicine that will work better for you and/or control your side effect better. If you need a refill on your pain medication, please contact your pharmacy.  They will contact our office to request authorization. Prescriptions will not be  filled after 5 pm or on week-ends.   Avoid getting constipated.  Between the surgery and the pain medications, it is common to experience some constipation.  Increasing fluid intake and taking a fiber supplement (such as Metamucil, Citrucel, FiberCon, MiraLax, etc) 1-2 times a day regularly will usually help prevent this problem from occurring.  A mild laxative (prune juice, Milk of Magnesia, MiraLax, etc) should be taken according to package directions if there are no bowel movements after 48 hours.   Watch out for diarrhea.  If you have many loose bowel movements, simplify your diet to bland foods & liquids for a few days.  Stop any stool softeners and decrease your fiber supplement.  Switching to mild anti-diarrheal medications (Kayopectate, Pepto Bismol) can help.  If this worsens or does not improve, please call us. Wash / shower every day.  You may shower over the dressings as they are waterproof.  Continue to shower over incision(s) after the dressing is off. Remove your waterproof bandages 5 days after surgery.  You may leave the incision open to air.  You may replace a dressing/Band-Aid to cover the incision for comfort if you wish.  ACTIVITIES as tolerated:   You may resume regular (light) daily activities beginning the next day--such as daily self-care, walking, climbing stairs--gradually increasing activities as tolerated.  If you can walk 30 minutes without difficulty, it is safe to try more intense activity such as jogging, treadmill, bicycling, low-impact aerobics, swimming, etc. Save the most intensive and strenuous activity for last such as sit-ups, heavy   lifting, contact sports, etc  Refrain from any heavy lifting or straining until you are off narcotics for pain control.   DO NOT PUSH THROUGH PAIN.  Let pain be your guide: If it hurts to do something, don't do it.  Pain is your body warning you to avoid that activity for another week until the pain goes down. You may drive when you are  no longer taking prescription pain medication, you can comfortably wear a seatbelt, and you can safely maneuver your car and apply brakes. You may have sexual intercourse when it is comfortable.  FOLLOW UP in our office Please call [facility] at [phone] to set up an appointment to see your surgeon in the office for a follow-up appointment approximately 2-3 weeks after your surgery. Make sure that you call for this appointment the day you arrive home to insure a convenient appointment time. 10. IF YOU HAVE DISABILITY OR FAMILY LEAVE FORMS, BRING THEM TO THE OFFICE FOR PROCESSING.  DO NOT GIVE THEM TO YOUR DOCTOR.   WHEN TO CALL US [phone]: Poor pain control Reactions / problems with new medications (rash/itching, nausea, etc)  Fever over 101.5 F (38.5 C) Inability to urinate Nausea and/or vomiting Worsening swelling or bruising Continued bleeding from incision. Increased pain, redness, or drainage from the incision   The clinic staff is available to answer your questions during regular business hours (8:30am-5pm).  Please don't hesitate to call and ask to speak to one of our nurses for clinical concerns.   If you have a medical emergency, go to the nearest emergency room or call 911.  A surgeon from [facility] is always on call at the hospitals   [facility] [address], [address], [place], [place]  [address] ? MAIN: [phone] ? TOLL FREE: [phone] ?  FAX [phone] [email or web address]   

## 2024-06-07 NOTE — Progress Notes (Signed)
Meds returned to patient.

## 2024-06-07 NOTE — Progress Notes (Addendum)
 Gastroenterology Inpatient Follow-up Note   PATIENT IDENTIFICATION  Lisa Casey is a 63 y.o. female Hospital Day: 4  SUBJECTIVE  The patient's chart has been reviewed. The patient's labs have been reviewed.  LFT's are trending downwards. Today, the patient is feeling better than yesterday with more energy. Still has abdominal upper discomfort near area of her surgery but improving slowly The patient denies fevers or chills. She has tolerated her diet. She had questions about the ERCP which we discussed.   OBJECTIVE   Scheduled Inpatient Medications:   acetaminophen   1,000 mg Oral Q6H   amLODipine   10 mg Oral Daily   enoxaparin  (LOVENOX ) injection  40 mg Subcutaneous Q24H   insulin  aspart  0-15 Units Subcutaneous TID WC   isosorbide  mononitrate  30 mg Oral QHS   pantoprazole   40 mg Oral BID AC   polyethylene glycol  17 g Oral Daily   venlafaxine   25 mg Oral BID   Continuous Inpatient Infusions:   piperacillin -tazobactam (ZOSYN )  IV 3.375 g (06/07/24 0532)   PRN Inpatient Medications: albuterol , alum & mag hydroxide-simeth, bisacodyl , hydrALAZINE , hydrOXYzine , ibuprofen , LORazepam , magic mouthwash, melatonin, menthol , methocarbamol , morphine  injection, nitroGLYCERIN , ondansetron  **OR** ondansetron  (ZOFRAN ) IV, oxyCODONE , phenol, pregabalin , simethicone    Physical Examination   Temp:  [98 F (36.7 C)-98.7 F (37.1 C)] 98.5 F (36.9 C) (09/21 0210) Pulse Rate:  [67-91] 78 (09/21 0210) Resp:  [13-21] 17 (09/21 0210) BP: (132-164)/(65-86) 160/77 (09/21 0210) SpO2:  [94 %-99 %] 97 % (09/21 0210) Weight:  [85.3 kg] 85.3 kg (09/20 0714) Temp (24hrs), Avg:98.3 F (36.8 C), Min:98 F (36.7 C), Max:98.7 F (37.1 C)  Weight: 85.3 kg GEN: NAD, appears stated age, doesn't appear chronically ill PSYCH: Cooperative, without pressured speech EYE: Conjunctivae pink, sclerae anicteric ENT: MMM CV: Nontachycardic RESP: No audible wheezing GI: NABS, soft, TTP in upper  abdomen upon deep palpation, no rebound  MSK/EXT: No significant lower extremity edema SKIN: No jaundice NEURO:  Alert & Oriented x 3, no focal deficits   Review of Data   Laboratory Studies   Recent Labs  Lab 06/05/24 0435 06/06/24 0506  NA 142 139  K 3.6 3.6  CL 106 104  CO2 22 21*  BUN 9 9  CREATININE 0.65 0.68  GLUCOSE 120* 103*  CALCIUM  9.5 9.5  MG 1.9  --   PHOS 4.6  --    Recent Labs  Lab 06/06/24 0506  AST 39  ALT 110*  ALKPHOS 153*    Recent Labs  Lab 06/04/24 1246 06/05/24 0435  WBC 6.5 4.0  HGB 13.6 13.0  HCT 43.3 41.3  PLT 228 227   Recent Labs  Lab 06/06/24 0506  INR 1.0   Imaging Studies  DG CHEST PORT 1 VIEW Result Date: 06/05/2024 EXAM: 1 VIEW XRAY OF THE CHEST 06/05/2024 11:25:00 AM COMPARISON: 08/01/2023 CLINICAL HISTORY: SOB (shortness of breath) 141880. SOB, post code stroke. FINDINGS: LUNGS AND PLEURA: Low lung volumes with bibasilar linear opacities. Possible small left pleural effusion. No pneumothorax. HEART AND MEDIASTINUM: Unchanged heart size and mediastinal contours. BONES AND SOFT TISSUES: Thoracolumbar scoliosis. No acute osseous abnormality. IMPRESSION: 1. Low lung volumes with bibasilar linear opacities. 2. Possible small left pleural effusion. Electronically signed by: Andrea Gasman MD 06/05/2024 03:05 PM EDT RP Workstation: [id number]   CT HEAD CODE STROKE WO CONTRAST Result Date: 06/05/2024 EXAM: CT HEAD WITHOUT CONTRAST 06/05/2024 11:37:25 AM TECHNIQUE: CT of the head was performed without the administration of intravenous contrast. Automated exposure control, iterative  reconstruction, and/or weight based adjustment of the mA/kV was utilized to reduce the radiation dose to as low as reasonably achievable. COMPARISON: MRI head 04/15/2021 CLINICAL HISTORY: Neuro deficit, acute, stroke suspected. Nausea - dizzy. Sudden onset generalized weakness. FINDINGS: BRAIN AND VENTRICLES: No acute hemorrhage. No evidence of acute infarct. No  hydrocephalus. No extra-axial collection. No mass effect or midline shift. [place] stroke program early CT (ASPECT) score: Ganglionic (caudate, internal capsule, lentiform nucleus, insula, M1-M3): 7 Supraganglionic (M4-M6): 3 Total: 10 ORBITS: No acute abnormality. SINUSES: No acute abnormality. SOFT TISSUES AND SKULL: No acute soft tissue abnormality. No skull fracture. IMPRESSION: 1. No acute intracranial abnormality. 2. ASPECT score: 10 3. Findings messaged to Dr. Germaine at 11:44AM on 06/05/24. Electronically signed by: Donnice Mania MD 06/05/2024 11:45 AM EDT RP Workstation: [id number]   MR ABDOMEN MRCP W WO CONTAST Result Date: 06/05/2024 CLINICAL DATA:  Patient with elevated liver function tests with persistent pain and nausea status post cholecystectomy EXAM: MRI ABDOMEN WITHOUT AND WITH CONTRAST (INCLUDING MRCP) TECHNIQUE: Multiplanar multisequence MR imaging of the abdomen was performed both before and after the administration of intravenous contrast. Heavily T2-weighted images of the biliary and pancreatic ducts were obtained. Post-processing was applied at the acquisition scanner with concurrent physician supervision which includes 3D reconstructions, MIPs, volume rendered images and/or shaded surface rendering. CONTRAST:  8mL GADAVIST  GADOBUTROL  1 MMOL/ML IV SOLN COMPARISON:  CT abdomen and pelvis dated 06/04/2024 FINDINGS: Lower chest: No acute findings. Hepatobiliary: No mass or other parenchymal abnormality identified. Mild intrahepatic bile duct dilation. Dilated common bile duct measures up to 11 mm. Within the dependent portion of the downstream duct, there is a 2 mm filling defect (3:19, 7:62). Cholecystectomy. Subtle enhancement along the gallbladder fossa. Pancreas: No mass, inflammatory changes, or other parenchymal abnormality identified. Spleen:  Within normal limits in size and appearance. Adrenals/Urinary Tract: No adrenal nodules. No suspicious renal masses identified. No evidence of  hydronephrosis. Stomach/Bowel: Visualized portions within the abdomen are unremarkable. Vascular/Lymphatic: No pathologically enlarged lymph nodes identified. No abdominal aortic aneurysm demonstrated. Other:  None. Musculoskeletal: No suspicious bone lesions identified. 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 Postsurgical changes in the anterior right upper quadrant abdominal wall. IMPRESSION: 1. Mild intrahepatic and extrahepatic bile duct dilation with a 2 mm filling defect in the downstream common bile duct, suspicious for choledocholithiasis. 2. Subtle enhancement along the gallbladder fossa, likely postsurgical. Electronically Signed   By: Limin  Xu M.D.   On: 06/05/2024 11:00   MR 3D Recon At Scanner Result Date: 06/05/2024 CLINICAL DATA:  Patient with elevated liver function tests with persistent pain and nausea status post cholecystectomy EXAM: MRI ABDOMEN WITHOUT AND WITH CONTRAST (INCLUDING MRCP) TECHNIQUE: Multiplanar multisequence MR imaging of the abdomen was performed both before and after the administration of intravenous contrast. Heavily T2-weighted images of the biliary and pancreatic ducts were obtained. Post-processing was applied at the acquisition scanner with concurrent physician supervision which includes 3D reconstructions, MIPs, volume rendered images and/or shaded surface rendering. CONTRAST:  8mL GADAVIST  GADOBUTROL  1 MMOL/ML IV SOLN COMPARISON:  CT abdomen and pelvis dated 06/04/2024 FINDINGS: Lower chest: No acute findings. Hepatobiliary: No mass or other parenchymal abnormality identified. Mild intrahepatic bile duct dilation. Dilated common bile duct measures up to 11 mm. Within the dependent portion of the downstream duct, there is a 2 mm filling defect (3:19, 7:62). Cholecystectomy. Subtle enhancement along the gallbladder fossa. Pancreas: No mass, inflammatory changes, or other parenchymal abnormality identified. Spleen:  Within normal limits in  size and appearance. Adrenals/Urinary Tract: No adrenal nodules. No suspicious renal masses identified. No evidence of hydronephrosis. Stomach/Bowel: Visualized portions within the abdomen are unremarkable. Vascular/Lymphatic: No pathologically enlarged lymph nodes identified. No abdominal aortic aneurysm demonstrated. Other:  None. Musculoskeletal: No suspicious bone lesions identified. 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 Postsurgical changes in the anterior right upper quadrant abdominal wall. IMPRESSION: 1. Mild intrahepatic and extrahepatic bile duct dilation with a 2 mm filling defect in the downstream common bile duct, suspicious for choledocholithiasis. 2. Subtle enhancement along the gallbladder fossa, likely postsurgical. Electronically Signed   By: Limin  Xu M.D.   On: 06/05/2024 11:00   GI Procedures and Studies  ERCP - Erythematous mucosa in the stomach. Biopsied. - No gross lesions in the duodenal bulb, in the first portion of the duodenum and in the second portion of the duodenum. - The major papilla was adjacent to a diverticulum. - The major papilla had an elongated intraduodenal portion. Orifice itself was small. - Double wire technique required for biliary cannulation. - The entire main bile duct was mildly dilated. - A biliary sphincterotomy was performed. - The biliary tree was swept and sludge was found. - One temporary plastic pancreatic stent was placed into the ventral pancreatic duct to decrease PEP.   ASSESSMENT  Ms. Kleinschmidt is a 63 y.o. female with a pmh significant for CAD, hypertension, fibromyalgia, diabetes, anxiety, GERD, status post cholecystectomy.  Patient admitted for abnormal liver test and continued abdominal pain now post ERCP with sphincterotomy and sludge removal.  Patient is hemodynamically and clinically stable at this time.  Has some improvement post ERCP.  No evidence of post ERCP pancreatitis or complication at this time.   From a GI standpoint seeing the liver biochemical testing decreasing and her feeling better helps me feel that she is on the road to recovery.  No further inpatient GI workup is expected at this time.  My office and Dr. Curtis office will coordinate her follow-up KUB to ensure the pancreas stent falls out, with KUB in 2 weeks.  Patient appreciative for the care she has received from GI.  All patient questions were answered to the best of my ability, and the patient agrees to the aforementioned plan of action with follow-up as indicated.   PLAN/RECOMMENDATIONS  Continue to advance diet as tolerated My office will arrange a 2-week follow-up KUB to ensure passage of the pancreas stent - If PD stent is still in place, we will proceed with EGD by myself or Dr. Rollin (her primary GI) for stent pull Trend LFTs while in-house and then follow-up per general surgery and primary GI I do not see a contraindication for her to be able to be discharged but leave that up to surgical service   Inpatient GI will sign off at this time.  Please page/call with questions or concerns.   Aloha Finner, MD [facility] Office # [phone]    LOS: 3 days  Aloha Finner Raddle  06/07/2024, 5:47 AM

## 2024-06-07 NOTE — Discharge Summary (Signed)
 Physician Discharge Summary  Patient ID: Lisa Casey MRN: [id number] DOB/AGE: Jun 01, 1961 63 y.o.  Admit date: 06/04/2024 Discharge date: 06/07/2024  Admission Diagnoses:  Discharge Diagnoses:  Principal Problem:   Elevated LFTs Active Problems:   Abnormal MRI of abdomen   Biliary sludge   Gastritis without bleeding   Choledocholithiasis   Dilation of biliary tract   Discharged Condition: good  Hospital Course: Patient admitted form ED after lap chole 1 prior.  She was having pain and lft's were elevated. Mrcp positive for small stone.  ERCP was performed and blockage removed.  Patient was feeling much better and ready to go home the following day   Consults: GI  Significant Diagnostic Studies: labs: cbc, bmet  Treatments: IV hydration, antibiotics: Zosyn , analgesia: acetaminophen  and Morphine , and procedures: ERCP with biliary stent placement  Discharge Exam: Blood pressure 134/76, pulse 70, temperature 98.2 F (36.8 C), temperature source Oral, resp. rate 16, height 5' 5 (1.651 m), weight 85.3 kg, last menstrual period 08/04/2011, SpO2 95%. General appearance: alert and cooperative GI: soft, non-tender Incision/Wound: clean, dry, intact  Disposition: Discharge disposition: 01-Home or Self Care        Allergies as of 06/07/2024       Reactions   Codeine Itching, Rash   Glucophage  [metformin ] Other (See Comments)   GI intolerance        Medication List     TAKE these medications    acetaminophen  500 MG tablet Commonly known as: TYLENOL  Take 1,000 mg by mouth every 6 (six) hours as needed for moderate pain or headache.   albuterol  108 (90 Base) MCG/ACT inhaler Commonly known as: VENTOLIN  HFA Inhale 2 puffs into the lungs every 6 (six) hours as needed for wheezing or shortness of breath.   amLODipine  10 MG tablet Commonly known as: NORVASC  Take 1 tablet (10 mg total) by mouth daily.   Azelastine  HCl 0.15 % Soln Place 2 sprays into the nose as  needed.   Blink Tears 0.25 % Soln Generic drug: Polyethylene Glycol 400 Place 1 drop into both eyes in the morning and at bedtime.   eszopiclone  1 MG Tabs tablet Commonly known as: LUNESTA  TAKE 1 TABLET BY MOUTH AT BEDTIME AS NEEDED FOR SLEEP. TAKE  IMMEDIATELY  BEFORE  BEDTIME What changed: See the new instructions.   Eye Health Caps Take 1 capsule by mouth See admin instructions. BLINK NutriTears Clinically Proven Supplement for Dry Eyes Softgels- Take 1 softgel by mouth one to two times a day   fluticasone  50 MCG/ACT nasal spray Commonly known as: FLONASE  Place 1 spray into both nostrils 2 (two) times daily as needed for allergies or rhinitis.   hydrOXYzine  25 MG tablet Commonly known as: ATARAX  TAKE 1 TABLET BY MOUTH EVERY 6 HOURS AS NEEDED What changed: additional instructions   ibuprofen  800 MG tablet Commonly known as: ADVIL  Take 1 tablet (800 mg total) by mouth every 8 (eight) hours as needed. What changed: reasons to take this   isosorbide  mononitrate 30 MG 24 hr tablet Commonly known as: IMDUR  Take 1 tablet (30 mg total) by mouth daily. What changed: when to take this   levocetirizine 5 MG tablet Commonly known as: XYZAL  TAKE 1 TABLET BY MOUTH ONCE DAILY IN THE EVENING What changed:  when to take this reasons to take this   linaclotide  72 MCG capsule Commonly known as: Linzess  Take 1 capsule (72 mcg total) by mouth daily before breakfast. What changed:  when to take this reasons to take  this   meloxicam  15 MG tablet Commonly known as: MOBIC  Take 1 tablet by mouth once daily   metFORMIN  750 MG 24 hr tablet Commonly known as: GLUCOPHAGE -XR Take 1 tablet (750 mg total) by mouth daily with breakfast.   nitroGLYCERIN  0.4 MG SL tablet Commonly known as: NITROSTAT  Place 1 tablet (0.4 mg total) under the tongue every 5 (five) minutes as needed for chest pain.   ondansetron  8 MG disintegrating tablet Commonly known as: ZOFRAN -ODT Take 1 tablet (8 mg  total) by mouth every 8 (eight) hours as needed for nausea. What changed: reasons to take this   OneTouch Delica Lancets 33G Misc Use as directed to check blood sugars 2 times per day dx: e11.65   OneTouch Verio test strip Generic drug: glucose blood Use as directed to check blood sugars 2 times per day dx: e11.65   oxyCODONE  5 MG immediate release tablet Commonly known as: Oxy IR/ROXICODONE  Take 1 tablet (5 mg total) by mouth every 6 (six) hours as needed for severe pain (pain score 7-10).   oxyCODONE -acetaminophen  5-325 MG tablet Commonly known as: PERCOCET/ROXICET Take 1 tablet by mouth every 8 (eight) hours as needed for severe pain (pain score 7-10).   pregabalin  225 MG capsule Commonly known as: LYRICA  Take 225 mg by mouth in the morning and at bedtime.   Systane Complete PF 0.6 % Soln Generic drug: Propylene Glycol (PF) Place 1 drop into both eyes 4 (four) times daily as needed (for dryness).   venlafaxine  25 MG tablet Commonly known as: EFFEXOR  Take 1 tablet (25 mg total) by mouth 2 (two) times daily. What changed:  how much to take when to take this        Follow-up Information     Kinsinger, Herlene Righter, MD Follow up in 2 week(s).   Specialty: General Surgery Why: or as scheduled Contact information: [address] [place] [address] [phone]                 Signed: Bernarda JAYSON Ned 06/07/2024, 9:43 AM

## 2024-06-08 ENCOUNTER — Telehealth: Payer: Self-pay

## 2024-06-08 ENCOUNTER — Other Ambulatory Visit: Payer: Self-pay | Admitting: General Surgery

## 2024-06-08 ENCOUNTER — Encounter: Payer: Self-pay | Admitting: General Surgery

## 2024-06-08 ENCOUNTER — Encounter (HOSPITAL_COMMUNITY): Payer: Self-pay | Admitting: Gastroenterology

## 2024-06-08 DIAGNOSIS — T85528A Displacement of other gastrointestinal prosthetic devices, implants and grafts, initial encounter: Secondary | ICD-10-CM

## 2024-06-08 DIAGNOSIS — Z9049 Acquired absence of other specified parts of digestive tract: Secondary | ICD-10-CM

## 2024-06-08 NOTE — Telephone Encounter (Signed)
-----   Message from [facility] sent at 06/06/2024  8:37 AM EDT ----- Regarding: Followup Otto Caraway, This patient needs a KUB follow-up in 2 weeks for pancreas stent post ERCP. Please place under my name. Please coordinate this. The patient will remain a Dr. Keturah. Kristie patient, but I want to make sure that the stent is fallen out. Thanks. GM  FYI PH If stent remains at time of KUB, then we can coordinate it being removed via EGD. Thanks. GM

## 2024-06-08 NOTE — Telephone Encounter (Signed)
 Order has been entered  Pt to be called in 2 weeks

## 2024-06-09 ENCOUNTER — Ambulatory Visit: Payer: Self-pay | Admitting: Gastroenterology

## 2024-06-09 ENCOUNTER — Other Ambulatory Visit

## 2024-06-09 LAB — SURGICAL PATHOLOGY

## 2024-06-15 ENCOUNTER — Telehealth: Payer: Self-pay | Admitting: Gastroenterology

## 2024-06-15 NOTE — Telephone Encounter (Signed)
 The pt has been advised that no appt is needed. She has been given the address and hours of our basement radiology

## 2024-06-15 NOTE — Telephone Encounter (Signed)
 Received a call from patient stating she was discharged from hospital and is due to come in for KUB follow up in 2 weeks. Patient requesting nurse fu call to discuss scheduling. Please review and advise  Thank you

## 2024-06-18 ENCOUNTER — Other Ambulatory Visit: Payer: Self-pay | Admitting: Nurse Practitioner

## 2024-06-18 DIAGNOSIS — F419 Anxiety disorder, unspecified: Secondary | ICD-10-CM

## 2024-06-18 NOTE — Telephone Encounter (Signed)
 Please confirm with the patient how much she is taking because there are two different orders.

## 2024-06-23 NOTE — Telephone Encounter (Signed)
 Stent fell out in stool. This was noted by patient and Dr. Rollin was updated. KUB no longer needed. For documentation purposes. GM

## 2024-06-25 ENCOUNTER — Telehealth: Payer: Self-pay

## 2024-06-25 NOTE — Telephone Encounter (Signed)
 KUB no longer needed stent fell out

## 2024-06-25 NOTE — Telephone Encounter (Signed)
-----   Message from Nurse Arthuro Canelo P sent at 06/08/2024  9:03 AM EDT ----- KUB

## 2024-06-28 ENCOUNTER — Other Ambulatory Visit: Payer: Self-pay | Admitting: Nurse Practitioner

## 2024-06-28 DIAGNOSIS — R7303 Prediabetes: Secondary | ICD-10-CM

## 2024-06-29 ENCOUNTER — Other Ambulatory Visit: Payer: Self-pay | Admitting: Nurse Practitioner

## 2024-07-03 ENCOUNTER — Telehealth: Payer: Self-pay | Admitting: Cardiology

## 2024-07-03 NOTE — Telephone Encounter (Signed)
 Good morning,   Pt dropped off DOT Medical Examiner Letter to be filled out. She stated she needs it by Monday, if possible. Pt would also like a phone call to pick paperwork up because they did not provide a fax number on the document. Callback number: [phone]. Thank you.   Location: Dr. Dorine mailbox

## 2024-07-06 NOTE — Telephone Encounter (Signed)
 Paperwork completed by Dr Shlomo and given to Jonel Bucco to be send to appropriate location.

## 2024-07-06 NOTE — Telephone Encounter (Signed)
 Paperwork received and given to Dr Shlomo for completion.

## 2024-07-09 DIAGNOSIS — Z0279 Encounter for issue of other medical certificate: Secondary | ICD-10-CM

## 2024-07-09 NOTE — Telephone Encounter (Signed)
 Completed [employer] form given to patient.  Patient signed Release of information and paid $29 form fee.

## 2024-07-17 ENCOUNTER — Telehealth: Payer: Self-pay | Admitting: Cardiology

## 2024-07-17 NOTE — Telephone Encounter (Signed)
 FMLA payment posted to pt's chart.  JB, 07-17-24

## 2024-07-21 ENCOUNTER — Other Ambulatory Visit: Payer: Self-pay | Admitting: Nurse Practitioner

## 2024-07-21 DIAGNOSIS — R0982 Postnasal drip: Secondary | ICD-10-CM

## 2024-07-27 ENCOUNTER — Ambulatory Visit: Payer: BC Managed Care – PPO | Admitting: Nurse Practitioner

## 2024-07-27 VITALS — BP 130/80 | HR 75 | Temp 98.6°F | Ht 65.0 in | Wt 190.0 lb

## 2024-07-27 DIAGNOSIS — Z79899 Other long term (current) drug therapy: Secondary | ICD-10-CM

## 2024-07-27 DIAGNOSIS — I10 Essential (primary) hypertension: Secondary | ICD-10-CM | POA: Diagnosis not present

## 2024-07-27 DIAGNOSIS — R7303 Prediabetes: Secondary | ICD-10-CM

## 2024-07-27 DIAGNOSIS — E66811 Obesity, class 1: Secondary | ICD-10-CM

## 2024-07-27 DIAGNOSIS — E6609 Other obesity due to excess calories: Secondary | ICD-10-CM

## 2024-07-27 DIAGNOSIS — Z6831 Body mass index (BMI) 31.0-31.9, adult: Secondary | ICD-10-CM

## 2024-07-27 DIAGNOSIS — Z0000 Encounter for general adult medical examination without abnormal findings: Secondary | ICD-10-CM

## 2024-07-27 DIAGNOSIS — Z9049 Acquired absence of other specified parts of digestive tract: Secondary | ICD-10-CM | POA: Diagnosis not present

## 2024-07-27 DIAGNOSIS — E782 Mixed hyperlipidemia: Secondary | ICD-10-CM

## 2024-07-27 LAB — POCT URINALYSIS DIP (CLINITEK)
Bilirubin, UA: NEGATIVE
Blood, UA: NEGATIVE
Glucose, UA: NEGATIVE mg/dL
Ketones, POC UA: NEGATIVE mg/dL
Leukocytes, UA: NEGATIVE
Nitrite, UA: NEGATIVE
POC PROTEIN,UA: NEGATIVE
Spec Grav, UA: 1.025 (ref 1.010–1.025)
Urobilinogen, UA: 0.2 U/dL
pH, UA: 6 (ref 5.0–8.0)

## 2024-07-27 NOTE — Assessment & Plan Note (Signed)
 Routine wellness visit with stable weight and no major concerns. - Continue current medications as prescribed. - Encouraged rest and recovery post-cholecystectomy. - Encouraged self-breast exams.

## 2024-07-27 NOTE — Assessment & Plan Note (Signed)
 Blood pressure well-controlled at 130/80 mmHg. - Continue current antihypertensive regimen.

## 2024-07-27 NOTE — Assessment & Plan Note (Signed)
 Post-cholecystectomy recovery ongoing. Follow-up with surgeon scheduled. - Continue follow up with surgeon as scheduled. - Maintain current activity restrictions until cleared by surgeon.

## 2024-07-27 NOTE — Progress Notes (Signed)
 Lisa Casey, CMA,acting as a neurosurgeon for Lisa Casey.,have documented all relevant documentation on the behalf of Lisa Casey,as directed by  Lisa Casey while in the presence of Lisa Casey.  Subjective:    Patient ID: Lisa Casey    DOB: [birth year] , 63 y.o.   MRN: [id number]  Chief Complaint  Patient presents with   Annual Exam    Patient presents today for HM, Patient reports compliance with medication. Patient denies any chest pain, SOB, or headaches. Patient has no concerns today. Patient previously had EKG.     She has been followed by general surgery and continues to be out on FMLA, her f/u is this Wednesday.     Discussed the use of AI scribe software for clinical note transcription with the patient, who gave verbal consent to proceed.  History of Present Illness Lisa Casey is a 63 year old Casey who presents for an annual physical exam.  She is currently on FMLA following gallbladder surgery and is awaiting a follow-up appointment with her surgeon. Her activity level remains restricted until further guidance from her surgeon. Her diet now includes more leafy greens and fish, while she avoids processed meats and reduces meat intake due to previous digestive issues prior to her gallbladder surgery. She reports some discomfort at the surgical site.  She has a history of prediabetes with a previous A1c level of 6.4, now improved to 5.8. She had been prescribed metformin  but discontinued it due to gastrointestinal side effects, including stomach upset and urgency to use the bathroom.  She does not require medication refills at this time and mentions that her pharmacy will fax requests if needed. She uses Metamucil instead of Linzess  for bowel management, as Linzess  was too strong for her.  Her family history includes her mother being diagnosed with breast cancer at age 10, which influences her diligence in performing self-breast  exams.  Past Medical History:  Diagnosis Date   Abdominal pain 05/26/2022   Anxiety    HX OF ANXIETY ATTACKS   Back pain, chronic    Bronchitis    Cholelithiasis 04/15/2021   Depression    Diabetes (HCC)    pt. state pre-DM highest HgbA1c  6.4   Fibromyalgia    GERD (gastroesophageal reflux disease)    H/O total hysterectomy 07/05/2022   Cervix removed per patient   History of kidney stones    Hypertension    Migraines    HX OF MIGRAINES   Myocardial infarction Lisa Casey)    july 2022   Obesity    Pneumonia    06-2021   Shortened PR interval      Family History  Problem Relation Age of Onset   Breast cancer Mother 5   Breast cancer Paternal Grandmother    Heart failure Neg Hx    Coronary artery disease Neg Hx      Current Outpatient Medications:    acetaminophen  (TYLENOL ) 500 MG tablet, Take 1,000 mg by mouth every 6 (six) hours as needed for moderate pain or headache., Disp: , Rfl:    albuterol  (VENTOLIN  HFA) 108 (90 Base) MCG/ACT inhaler, Inhale 2 puffs into the lungs every 6 (six) hours as needed for wheezing or shortness of breath., Disp: , Rfl:    amLODipine  (NORVASC ) 10 MG tablet, Take 1 tablet (10 mg total) by mouth daily., Disp: 180 tablet, Rfl: 3   BLINK TEARS 0.25 % SOLN, Place 1 drop into both eyes in  the morning and at bedtime., Disp: , Rfl:    fluticasone  (FLONASE ) 50 MCG/ACT nasal spray, Place 1 spray into both nostrils 2 (two) times daily as needed for allergies or rhinitis., Disp: , Rfl:    hydrOXYzine  (ATARAX ) 25 MG tablet, Take 1 tablet (25 mg total) by mouth every 6 (six) hours as needed. Take 25 mg by mouth twice a day and an additional 25 mg two times a day as needed for anxiety, Disp: 90 tablet, Rfl: 1   levocetirizine (XYZAL ) 5 MG tablet, TAKE 1 TABLET BY MOUTH ONCE DAILY IN THE EVENING, Disp: 90 tablet, Rfl: 0   Multiple Vitamins-Minerals (EYE HEALTH) CAPS, Take 1 capsule by mouth See admin instructions. BLINK NutriTears Clinically Proven Supplement  for Dry Eyes Softgels- Take 1 softgel by mouth one to two times a day, Disp: , Rfl:    pregabalin  (LYRICA ) 225 MG capsule, Take 225 mg by mouth in the morning and at bedtime., Disp: , Rfl:    venlafaxine  (EFFEXOR ) 25 MG tablet, Take 1 tablet by mouth twice daily, Disp: 180 tablet, Rfl: 0   Azelastine  HCl 0.15 % SOLN, Place 2 sprays into the nose as needed. (Patient not taking: Reported on 06/04/2024), Disp: 3 mL, Rfl: 1   Allergies  Allergen Reactions   Codeine Itching and Rash   Glucophage  [Metformin ] Other (See Comments)    GI intolerance      The patient states she uses status post hysterectomy for birth control. Patient's last menstrual period was 08/04/2011.  Negative for: breast discharge, breast lump(s), breast pain and breast self exam. Associated symptoms include abnormal vaginal bleeding. Pertinent negatives include abnormal bleeding (hematology), anxiety, decreased libido, depression, difficulty falling sleep, dyspareunia, history of infertility, nocturia, sexual dysfunction, sleep disturbances, urinary incontinence, urinary urgency, vaginal discharge and vaginal itching. Diet regular; she is eating more leafy green foods. Not as much meat; still not processing well. She is eating more fish. Avoiding processed meats. The patient states her exercise level is minimal due to her recent surgeries.   The patient's tobacco use is:  Social History   Tobacco Use  Smoking Status Former   Current packs/day: 0.00   Average packs/day: 0.3 packs/day for 10.0 years (2.5 ttl pk-yrs)   Types: Cigarettes   Start date: 79   Quit date: 1997   Years since quitting: 28.8  Smokeless Tobacco Never   She has been exposed to passive smoke. The patient's alcohol use is:  Social History   Substance and Sexual Activity  Alcohol Use No    Review of Systems  Constitutional: Negative.   HENT: Negative.    Eyes: Negative.   Respiratory: Negative.    Cardiovascular: Negative.   Gastrointestinal:  Negative.   Endocrine: Negative.   Genitourinary: Negative.   Musculoskeletal: Negative.   Skin: Negative.   Allergic/Immunologic: Negative.   Neurological: Negative.   Hematological: Negative.   Psychiatric/Behavioral: Negative.       Today's Vitals   07/27/24 1030  BP: 130/80  Pulse: 75  Temp: 98.6 F (37 C)  TempSrc: Oral  Weight: 190 lb (86.2 kg)  Height: 5' 5 (1.651 m)  PainSc: 0-No pain   Body mass index is 31.62 kg/m.  Wt Readings from Last 3 Encounters:  07/27/24 190 lb (86.2 kg)  06/06/24 188 lb (85.3 kg)  06/02/24 188 lb (85.3 kg)     Objective:  Physical Exam Vitals and nursing note reviewed.  Constitutional:      General: She is not in acute distress.  Appearance: Normal appearance. She is well-developed. She is obese.  HENT:     Head: Normocephalic and atraumatic.     Right Ear: Hearing, tympanic membrane, ear canal and external ear normal. There is no impacted cerumen.     Left Ear: Hearing, tympanic membrane, ear canal and external ear normal. There is no impacted cerumen.     Nose: Nose normal.     Mouth/Throat:     Mouth: Mucous membranes are moist.  Eyes:     General: Lids are normal.     Extraocular Movements: Extraocular movements intact.     Conjunctiva/sclera: Conjunctivae normal.     Pupils: Pupils are equal, round, and reactive to light.     Funduscopic exam:    Right eye: No papilledema.        Left eye: No papilledema.  Neck:     Thyroid: No thyroid mass.     Vascular: No carotid bruit.  Cardiovascular:     Rate and Rhythm: Normal rate and regular rhythm.     Pulses: Normal pulses.     Heart sounds: Normal heart sounds. No murmur heard. Pulmonary:     Effort: Pulmonary effort is normal. No respiratory distress.     Breath sounds: Normal breath sounds. No wheezing.  Chest:     Chest wall: No mass.  Breasts:    Tanner Score is 5.     Right: Normal. No mass or tenderness.     Left: Normal. No mass or tenderness.  Abdominal:      General: Abdomen is flat. Bowel sounds are normal. There is no distension.     Palpations: Abdomen is soft.     Tenderness: There is no abdominal tenderness.  Genitourinary:    Comments: N/A Musculoskeletal:        General: No swelling. Normal range of motion.     Cervical back: Full passive range of motion without pain, normal range of motion and neck supple.     Right lower leg: No edema.     Left lower leg: No edema.  Lymphadenopathy:     Upper Body:     Right upper body: No supraclavicular, axillary or pectoral adenopathy.     Left upper body: No supraclavicular, axillary or pectoral adenopathy.  Skin:    General: Skin is warm and dry.     Capillary Refill: Capillary refill takes less than 2 seconds.     Comments: Healed surgical scars to abdomen  Neurological:     General: No focal deficit present.     Mental Status: She is alert and oriented to person, place, and time.     Cranial Nerves: No cranial nerve deficit.     Sensory: No sensory deficit.     Motor: No weakness.  Psychiatric:        Mood and Affect: Mood normal. Affect is not flat or tearful.        Speech: Speech normal.        Behavior: Behavior normal.        Thought Content: Thought content normal.        Cognition and Memory: Cognition normal.        Judgment: Judgment normal.      Assessment And Plan:     Encounter for annual health examination Assessment & Plan: Routine wellness visit with stable weight and no major concerns. - Continue current medications as prescribed. - Encouraged rest and recovery post-cholecystectomy. - Encouraged self-breast exams.   Essential hypertension Assessment &  Plan: Blood pressure well-controlled at 130/80 mmHg. - Continue current antihypertensive regimen.  Orders: -     POCT URINALYSIS DIP (CLIN[facility]K) -     Microalbumin / creatinine urine ratio  Mixed hyperlipidemia Assessment & Plan: Cholesterol levels need re-evaluation. - Ordered cholesterol  panel.  Orders: -     Lipid panel  Prediabetes Assessment & Plan: A1c improved to 5.8%. Metformin  discontinued due to side effects. Discussed prediabetes management. - Restart metformin  with slow-release formulation to minimize gastrointestinal side effects. - Take metformin  with food, preferably after dinner. - Consider taking probiotic to aid digestion. - Will recheck A1c in a few months.   Class 1 obesity due to excess calories without serious comorbidity with body mass index (BMI) of 31.0 to 31.9 in adult Assessment & Plan: Weight is 190 lbs. Dietary changes ongoing per patient, she is trying to work on her diet - Continue dietary modifications with focus on leafy greens and reduced meat intake. - She is encouraged to strive for BMI less than 30 to decrease cardiac risk.  - Advised to aim for at least 150 minutes of exercise per week.    Other long term (current) drug therapy -     TSH  Status post cholecystectomy Assessment & Plan: Post-cholecystectomy recovery ongoing. Follow-up with surgeon scheduled. - Continue follow up with surgeon as scheduled. - Maintain current activity restrictions until cleared by surgeon.    Return for 1 year physical, controlled DM check 4 months. Patient was given opportunity to ask questions. Patient verbalized understanding of the plan and was able to repeat key elements of the plan. All questions were answered to their satisfaction.   Lisa Lisa Casey, have reviewed all documentation for this visit. The documentation on 07/27/24 for the exam, diagnosis, procedures, and orders are all accurate and complete.

## 2024-07-27 NOTE — Assessment & Plan Note (Signed)
 Cholesterol levels need re-evaluation. - Ordered cholesterol panel.

## 2024-07-27 NOTE — Assessment & Plan Note (Signed)
 A1c improved to 5.8%. Metformin  discontinued due to side effects. Discussed prediabetes management. - Restart metformin  with slow-release formulation to minimize gastrointestinal side effects. - Take metformin  with food, preferably after dinner. - Consider taking probiotic to aid digestion. - Will recheck A1c in a few months.

## 2024-07-27 NOTE — Patient Instructions (Signed)
 Health Maintenance  Topic Date Due   Flu Shot  04/17/2024   Yearly kidney health urinalysis for diabetes  07/23/2024   COVID-19 Vaccine (7 - 2025-26 season) 08/12/2024*   Yearly kidney function blood test for diabetes  06/07/2025   Breast Cancer Screening  08/22/2025   DTaP/Tdap/Td vaccine (2 - Td or Tdap) 06/07/2027   Colon Cancer Screening  07/28/2030   Pneumococcal Vaccine for age over 46  Completed   Hepatitis C Screening  Completed   HIV Screening  Completed   Zoster (Shingles) Vaccine  Completed   Hepatitis B Vaccine  Aged Out   HPV Vaccine  Aged Out   Meningitis B Vaccine  Aged Out  *Topic was postponed. The date shown is not the original due date.

## 2024-07-27 NOTE — Assessment & Plan Note (Signed)
 Weight is 190 lbs. Dietary changes ongoing per patient, she is trying to work on her diet - Continue dietary modifications with focus on leafy greens and reduced meat intake. - She is encouraged to strive for BMI less than 30 to decrease cardiac risk.  - Advised to aim for at least 150 minutes of exercise per week.

## 2024-07-28 ENCOUNTER — Ambulatory Visit: Payer: Self-pay | Admitting: Nurse Practitioner

## 2024-07-28 LAB — MICROALBUMIN / CREATININE URINE RATIO
Creatinine, Urine: 110 mg/dL
Microalb/Creat Ratio: 5 mg/g{creat} (ref 0–29)
Microalbumin, Urine: 5 ug/mL

## 2024-07-28 LAB — LIPID PANEL
Chol/HDL Ratio: 2 ratio (ref 0.0–4.4)
Cholesterol, Total: 156 mg/dL (ref 100–199)
HDL: 80 mg/dL (ref >39)
LDL Chol Calc (NIH): 63 mg/dL (ref 0–99)
Triglycerides: 65 mg/dL (ref 0–149)
VLDL Cholesterol Cal: 13 mg/dL (ref 5–40)

## 2024-07-28 LAB — TSH: TSH: 1.32 u[IU]/mL (ref 0.450–4.500)

## 2024-08-03 ENCOUNTER — Encounter: Payer: Self-pay | Admitting: Nurse Practitioner

## 2024-08-11 ENCOUNTER — Other Ambulatory Visit: Payer: Self-pay

## 2024-08-11 MED ORDER — HYDROXYZINE HCL 25 MG PO TABS: 25.0000 mg | ORAL_TABLET | Freq: Four times a day (QID) | ORAL | 1 refills | Status: DC | PRN

## 2024-09-16 ENCOUNTER — Other Ambulatory Visit: Payer: Self-pay | Admitting: Nurse Practitioner

## 2024-09-16 DIAGNOSIS — F419 Anxiety disorder, unspecified: Secondary | ICD-10-CM

## 2024-09-25 ENCOUNTER — Other Ambulatory Visit: Payer: Self-pay | Admitting: Nurse Practitioner

## 2024-09-25 DIAGNOSIS — R7303 Prediabetes: Secondary | ICD-10-CM

## 2024-10-01 ENCOUNTER — Other Ambulatory Visit: Payer: Self-pay | Admitting: Nurse Practitioner

## 2024-10-03 ENCOUNTER — Ambulatory Visit
Admission: EM | Admit: 2024-10-03 | Discharge: 2024-10-03 | Disposition: A | Discharge status: Home / Self Care | Attending: Family Medicine | Admitting: Family Medicine

## 2024-10-03 DIAGNOSIS — J019 Acute sinusitis, unspecified: Secondary | ICD-10-CM

## 2024-10-03 MED ORDER — AMOXICILLIN-POT CLAVULANATE 875-125 MG PO TABS: 1.0000 | ORAL_TABLET | Freq: Two times a day (BID) | ORAL | 0 refills | Status: AC

## 2024-10-03 NOTE — ED Triage Notes (Signed)
 Pt reports headache, nausea, lightheadedness when bend over,body aches, stuffy nose, watery eyes x 2 weeks. Tylenol ,  Mucinex gives some relief. 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    Pt reports negative COVID test.

## 2024-10-03 NOTE — Discharge Instructions (Signed)
 We will manage this as a sinus infection with Augmentin .  For sore throat or cough try using a honey-based tea.  Use 3 teaspoons of honey with juice squeezed from half lemon. Place shaved pieces of ginger into 1/2-1 cup of water and warm over stove top. Then mix the ingredients and repeat every 4 hours as needed. Please take Tylenol  500mg -650mg  every 6 hours for throat pain, fevers, aches and pains. Hydrate very well with at least 2 liters of water. Eat light meals such as soups (chicken and noodles, vegetable, chicken and wild rice).  Do not eat foods that you are allergic to.  Taking an antihistamine like Zyrtec  can help against postnasal drainage, sinus congestion which can cause sinus pain, sinus headaches, throat pain, painful swallowing, coughing.  Use cough medication as needed.

## 2024-10-03 NOTE — ED Provider Notes (Signed)
 " [facility]  Note:  This document was prepared using Conservation officer, historic buildings and may include unintentional dictation errors.  MRN: [id number] DOB: 1961-09-05  Subjective:   Lisa Casey is a 64 y.o. female presenting for 2-week history of persistent sinus congestion, drainage, watery eyes, frontal headaches with lightheadedness.  Patient also experiences worsening symptoms when she bends downward.  No confusion, vision change, cough, chest pain, shortness of breath or wheezing, weakness, numbness or tingling, neck pain, fever.  Patient did a COVID test at home and was negative.  Presents with her husband who reports he has concerns about an aneurysm, was diagnosed in 2022.  No smoking of any kind including cigarettes, cigars, vaping, marijuana use.    Current Outpatient Medications  Medication Instructions   acetaminophen  (TYLENOL ) 1,000 mg, Every 6 hours PRN   albuterol  (VENTOLIN  HFA) 108 (90 Base) MCG/ACT inhaler 2 puffs, Every 6 hours PRN   amLODipine  (NORVASC ) 10 mg, Oral, Daily   Azelastine  HCl 0.15 % SOLN 2 sprays, Nasal, As needed   BLINK TEARS 0.25 % SOLN 1 drop, 2 times daily   fluticasone  (FLONASE ) 50 MCG/ACT nasal spray 1 spray, 2 times daily PRN   guaiFENesin (MUCINEX) 600 MG 12 hr tablet 2 times daily   hydrOXYzine  (ATARAX ) 25 MG tablet TAKE 1 TABLET BY MOUTH EVERY 6 HOURS AS NEEDED. TAKE 1 TABLET TWICE A DAY AND AN ADDITIONAL 25MG  TWO TIMES A DAY AS NEEDED FOR ANXIETY   levocetirizine (XYZAL ) 5 mg, Oral, Every evening   Multiple Vitamins-Minerals (EYE HEALTH) CAPS 1 capsule, See admin instructions   pregabalin  (LYRICA ) 225 mg, 2 times daily   venlafaxine  (EFFEXOR ) 25 mg, Oral, 2 times daily   Vitamin D  (Ergocalciferol ) (DRISDOL) 50,000 Units, Weekly    Allergies[1]  Past Medical History:  Diagnosis Date   Abdominal pain 05/26/2022   Anxiety    HX OF ANXIETY ATTACKS   Back pain, chronic    Bronchitis    Cholelithiasis 04/15/2021    Depression    Diabetes (HCC)    pt. state pre-DM highest HgbA1c  6.4   Fibromyalgia    GERD (gastroesophageal reflux disease)    H/O total hysterectomy 07/05/2022   Cervix removed per patient   History of kidney stones    Hypertension    Migraines    HX OF MIGRAINES   Myocardial infarction [facility])    july 2022   Obesity    Pneumonia    06-2021   Shortened PR interval      Past Surgical History:  Procedure Laterality Date   CHOLECYSTECTOMY N/A 05/29/2024   Procedure: LAPAROSCOPIC CHOLECYSTECTOMY WITH ICG DYE;  Surgeon: Stevie Herlene Righter, MD;  Location: [facility];  Service: General;  Laterality: N/A;   CYSTOSCOPY W/ URETERAL STENT PLACEMENT  09/23/2012   Procedure: CYSTOSCOPY WITH RETROGRADE PYELOGRAM/URETERAL STENT PLACEMENT;  Surgeon: Ricardo Likens, MD;  Location: [facility];  Service: Urology;  Laterality: Right;   CYSTOSCOPY WITH RETROGRADE PYELOGRAM, URETEROSCOPY AND STENT PLACEMENT  10/08/2012   Procedure: CYSTOSCOPY WITH RETROGRADE PYELOGRAM, URETEROSCOPY AND STENT PLACEMENT;  Surgeon: Ricardo Likens, MD;  Location: [facility];  Service: Urology;  Laterality: Right;  90 MIN NO RETROGRADE NEEDS FLEX URETEROSCOPE    CYSTOSCOPY/URETEROSCOPY/HOLMIUM LASER/STENT PLACEMENT Right 08/21/2021   Procedure: CYSTOSCOPY RIGHT /URETEROSCOPY/HOLMIUM LASER/STENT PLACEMENT;  Surgeon: Carolee Sherwood JONETTA DOUGLAS, MD;  Location: [facility];  Service: Urology;  Laterality: Right;   ERCP N/A 06/06/2024   Procedure: ERCP, WITH INTERVENTION IF INDICATED;  Surgeon: Wilhelmenia Aloha Raddle., MD;  Location: [facility];  Service: Gastroenterology;  Laterality: N/A;   ESOPHAGOGASTRODUODENOSCOPY N/A 05/08/2024   Procedure: EGD (ESOPHAGOGASTRODUODENOSCOPY);  Surgeon: Rollin Dover, MD;  Location: [facility];  Service: Gastroenterology;  Laterality: N/A;   EUS N/A 05/08/2024   Procedure: ULTRASOUND, UPPER GI TRACT, ENDOSCOPIC;  Surgeon: Rollin Dover, MD;  Location: [facility];  Service:  Gastroenterology;  Laterality: N/A;   HOLMIUM LASER APPLICATION  10/08/2012   Procedure: HOLMIUM LASER APPLICATION;  Surgeon: Ricardo Likens, MD;  Location: [facility];  Service: Urology;  Laterality: Right;   LEFT HEART CATH AND CORONARY ANGIOGRAPHY N/A 04/17/2021   Procedure: LEFT HEART CATH AND CORONARY ANGIOGRAPHY;  Surgeon: Verlin Lonni BIRCH, MD;  Location: [facility];  Service: Cardiovascular;  Laterality: N/A;   LEFT HEART CATHETERIZATION WITH CORONARY ANGIOGRAM N/A 10/29/2011   Procedure: LEFT HEART CATHETERIZATION WITH CORONARY ANGIOGRAM;  Surgeon: Alm LELON Clay, MD;  Location: [facility];  Service: Cardiovascular;  Laterality: N/A;   ROBOTIC ASSISTED LAP VAGINAL HYSTERECTOMY  09/14/2011   Procedure: ROBOTIC ASSISTED LAPAROSCOPIC VAGINAL HYSTERECTOMY;  Surgeon: Olam DELENA Mill, MD;  Location: [facility];  Service: Gynecology;  Laterality: N/A;   TUBAL LIGATION      Family History  Problem Relation Age of Onset   Breast cancer Mother 53   Breast cancer Paternal Grandmother    Heart failure Neg Hx    Coronary artery disease Neg Hx     Social History   Occupational History   Occupation: school bus driver  Tobacco Use   Smoking status: Former    Current packs/day: 0.00    Average packs/day: 0.3 packs/day for 10.0 years (2.5 ttl pk-yrs)    Types: Cigarettes    Start date: 35    Quit date: 1997    Years since quitting: 29.0   Smokeless tobacco: Never  Vaping Use   Vaping status: Never Used  Substance and Sexual Activity   Alcohol use: No   Drug use: Never   Sexual activity: Yes    Birth control/protection: Other-see comments    Comment: hysterectomy     ROS   Objective:   Vitals: BP (!) 153/85 (BP Location: Left Arm)   Pulse 74   Temp 98.8 F (37.1 C) (Oral)   Resp 16   LMP 08/04/2011   SpO2 95%   Physical Exam Constitutional:      General: She is not in acute distress.    Appearance: Normal appearance. She is  well-developed and normal weight. She is not ill-appearing, toxic-appearing or diaphoretic.  HENT:     Head: Normocephalic and atraumatic.     Right Ear: Tympanic membrane, ear canal and external ear normal. No drainage or tenderness. No middle ear effusion. There is no impacted cerumen. Tympanic membrane is not injected, perforated, erythematous or bulging.     Left Ear: Tympanic membrane, ear canal and external ear normal. No drainage or tenderness.  No middle ear effusion. There is no impacted cerumen. Tympanic membrane is not injected, perforated, erythematous or bulging.     Nose: Congestion present. No rhinorrhea.     Mouth/Throat:     Mouth: Mucous membranes are moist. No oral lesions.     Pharynx: No pharyngeal swelling, oropharyngeal exudate, posterior oropharyngeal erythema or uvula swelling.     Tonsils: No tonsillar exudate or tonsillar abscesses. 0 on the right. 0 on the left.  Eyes:     General: No scleral icterus.  Right eye: No discharge.        Left eye: No discharge.     Extraocular Movements: Extraocular movements intact.     Right eye: Normal extraocular motion.     Left eye: Normal extraocular motion.     Conjunctiva/sclera: Conjunctivae normal.  Neck:     Meningeal: Brudzinski's sign and Kernig's sign absent.  Cardiovascular:     Rate and Rhythm: Normal rate and regular rhythm.     Heart sounds: Normal heart sounds. No murmur heard.    No friction rub. No gallop.  Pulmonary:     Effort: Pulmonary effort is normal. No respiratory distress.     Breath sounds: No stridor. No wheezing, rhonchi or rales.  Chest:     Chest wall: No tenderness.  Musculoskeletal:     Cervical back: Normal range of motion and neck supple.  Lymphadenopathy:     Cervical: No cervical adenopathy.  Skin:    General: Skin is warm and dry.  Neurological:     General: No focal deficit present.     Mental Status: She is alert and oriented to person, place, and time.     Cranial Nerves:  No cranial nerve deficit.     Motor: No weakness or pronator drift.     Coordination: Romberg sign negative. Coordination normal.     Gait: Gait normal.  Psychiatric:        Mood and Affect: Mood normal.        Behavior: Behavior normal.     Assessment and Plan :   PDMP not reviewed this encounter.  1. Acute sinusitis, recurrence not specified, unspecified location    Discussed concerns for aneurysm with her husband.  Unfortunately I cannot rule this out and recommended presenting to the emergency room for consideration of imaging.  I did offer the patient Augmentin  for treatment of sinusitis, sinus infection precipitating her symptoms.  Use supportive care otherwise.  Reassuring neurologic exam, no signs of an acute encephalopathy.  Counseled patient on potential for adverse effects with medications prescribed today, patient verbalized understanding.     [1]  Allergies Allergen Reactions   Codeine Itching and Rash   Glucophage  [Metformin ] Other (See Comments)    GI intolerance     Christopher Savannah, [place] 10/03/24 1117  "

## 2024-11-24 ENCOUNTER — Ambulatory Visit: Admitting: Nurse Practitioner

## 2025-07-28 ENCOUNTER — Encounter: Admitting: Nurse Practitioner
